# Patient Record
Sex: Male | Born: 1970 | Race: White | Hispanic: No | Marital: Single | Smoking: Never smoker
Health system: Southern US, Community
[De-identification: ages and names within clinical notes are randomized; demographics above are authoritative.]

## PROBLEM LIST (undated history)

## (undated) DIAGNOSIS — M549 Dorsalgia, unspecified: Secondary | ICD-10-CM

## (undated) DIAGNOSIS — Z87442 Personal history of urinary calculi: Secondary | ICD-10-CM

## (undated) DIAGNOSIS — F329 Major depressive disorder, single episode, unspecified: Secondary | ICD-10-CM

## (undated) DIAGNOSIS — R7303 Prediabetes: Secondary | ICD-10-CM

## (undated) DIAGNOSIS — I10 Essential (primary) hypertension: Secondary | ICD-10-CM

## (undated) DIAGNOSIS — M961 Postlaminectomy syndrome, not elsewhere classified: Secondary | ICD-10-CM

## (undated) DIAGNOSIS — T7840XA Allergy, unspecified, initial encounter: Secondary | ICD-10-CM

## (undated) DIAGNOSIS — G43909 Migraine, unspecified, not intractable, without status migrainosus: Secondary | ICD-10-CM

## (undated) DIAGNOSIS — G4730 Sleep apnea, unspecified: Secondary | ICD-10-CM

## (undated) DIAGNOSIS — K921 Melena: Secondary | ICD-10-CM

## (undated) DIAGNOSIS — M1990 Unspecified osteoarthritis, unspecified site: Secondary | ICD-10-CM

## (undated) DIAGNOSIS — N390 Urinary tract infection, site not specified: Secondary | ICD-10-CM

## (undated) DIAGNOSIS — E785 Hyperlipidemia, unspecified: Secondary | ICD-10-CM

## (undated) DIAGNOSIS — F32A Depression, unspecified: Secondary | ICD-10-CM

## (undated) DIAGNOSIS — F419 Anxiety disorder, unspecified: Secondary | ICD-10-CM

## (undated) DIAGNOSIS — G44009 Cluster headache syndrome, unspecified, not intractable: Secondary | ICD-10-CM

## (undated) DIAGNOSIS — R51 Headache: Secondary | ICD-10-CM

## (undated) DIAGNOSIS — M47817 Spondylosis without myelopathy or radiculopathy, lumbosacral region: Secondary | ICD-10-CM

## (undated) DIAGNOSIS — I639 Cerebral infarction, unspecified: Secondary | ICD-10-CM

## (undated) HISTORY — DX: Urinary tract infection, site not specified: N39.0

## (undated) HISTORY — DX: Unspecified osteoarthritis, unspecified site: M19.90

## (undated) HISTORY — DX: Melena: K92.1

## (undated) HISTORY — DX: Spondylosis without myelopathy or radiculopathy, lumbosacral region: M47.817

## (undated) HISTORY — DX: Postlaminectomy syndrome, not elsewhere classified: M96.1

## (undated) HISTORY — DX: Migraine, unspecified, not intractable, without status migrainosus: G43.909

## (undated) HISTORY — PX: BACK SURGERY: SHX140

## (undated) HISTORY — DX: Hyperlipidemia, unspecified: E78.5

## (undated) HISTORY — DX: Allergy, unspecified, initial encounter: T78.40XA

## (undated) HISTORY — DX: Headache: R51

---

## 2006-09-14 DIAGNOSIS — I639 Cerebral infarction, unspecified: Secondary | ICD-10-CM

## 2006-09-14 HISTORY — DX: Cerebral infarction, unspecified: I63.9

## 2007-05-25 ENCOUNTER — Emergency Department (HOSPITAL_COMMUNITY): Admission: EM | Admit: 2007-05-25 | Discharge: 2007-05-25 | Payer: Self-pay | Admitting: Emergency Medicine

## 2007-10-28 ENCOUNTER — Encounter: Admission: RE | Admit: 2007-10-28 | Discharge: 2007-10-28 | Payer: Self-pay | Admitting: Occupational Medicine

## 2007-11-10 ENCOUNTER — Encounter: Admission: RE | Admit: 2007-11-10 | Discharge: 2007-12-02 | Payer: Self-pay | Admitting: Occupational Medicine

## 2007-12-03 ENCOUNTER — Emergency Department (HOSPITAL_COMMUNITY): Admission: EM | Admit: 2007-12-03 | Discharge: 2007-12-03 | Payer: Self-pay | Admitting: Family Medicine

## 2008-01-23 ENCOUNTER — Emergency Department (HOSPITAL_COMMUNITY): Admission: EM | Admit: 2008-01-23 | Discharge: 2008-01-23 | Payer: Self-pay | Admitting: Emergency Medicine

## 2008-07-23 ENCOUNTER — Emergency Department (HOSPITAL_COMMUNITY): Admission: EM | Admit: 2008-07-23 | Discharge: 2008-07-23 | Payer: Self-pay | Admitting: Family Medicine

## 2008-09-14 HISTORY — PX: LUMBAR LAMINECTOMY/DECOMPRESSION MICRODISCECTOMY: SHX5026

## 2009-02-28 ENCOUNTER — Emergency Department (HOSPITAL_COMMUNITY): Admission: EM | Admit: 2009-02-28 | Discharge: 2009-02-28 | Payer: Self-pay | Admitting: Family Medicine

## 2009-08-22 ENCOUNTER — Ambulatory Visit (HOSPITAL_COMMUNITY): Admission: RE | Admit: 2009-08-22 | Discharge: 2009-08-23 | Payer: Self-pay | Admitting: Specialist

## 2009-09-14 HISTORY — PX: ANTERIOR AND POSTERIOR SPINAL FUSION: SHX2259

## 2010-02-19 ENCOUNTER — Ambulatory Visit: Payer: Self-pay | Admitting: Vascular Surgery

## 2010-03-27 ENCOUNTER — Inpatient Hospital Stay (HOSPITAL_COMMUNITY): Admission: RE | Admit: 2010-03-27 | Discharge: 2010-03-30 | Payer: Self-pay | Admitting: Specialist

## 2010-03-27 ENCOUNTER — Ambulatory Visit: Payer: Self-pay | Admitting: Vascular Surgery

## 2010-03-28 ENCOUNTER — Encounter (INDEPENDENT_AMBULATORY_CARE_PROVIDER_SITE_OTHER): Payer: Self-pay | Admitting: Specialist

## 2010-03-28 ENCOUNTER — Ambulatory Visit: Payer: Self-pay | Admitting: Vascular Surgery

## 2010-05-27 ENCOUNTER — Encounter: Admission: RE | Admit: 2010-05-27 | Discharge: 2010-05-27 | Payer: Self-pay | Admitting: Vascular Surgery

## 2010-05-27 ENCOUNTER — Ambulatory Visit: Payer: Self-pay | Admitting: Vascular Surgery

## 2010-10-14 ENCOUNTER — Encounter
Admission: RE | Admit: 2010-10-14 | Discharge: 2010-10-14 | Payer: Self-pay | Source: Home / Self Care | Attending: Specialist | Admitting: Specialist

## 2010-11-29 LAB — URINALYSIS, ROUTINE W REFLEX MICROSCOPIC
Glucose, UA: NEGATIVE mg/dL
Hgb urine dipstick: NEGATIVE
pH: 7.5 (ref 5.0–8.0)

## 2010-11-29 LAB — BASIC METABOLIC PANEL
BUN: 11 mg/dL (ref 6–23)
BUN: 9 mg/dL (ref 6–23)
CO2: 30 mEq/L (ref 19–32)
CO2: 30 mEq/L (ref 19–32)
Calcium: 8 mg/dL — ABNORMAL LOW (ref 8.4–10.5)
Calcium: 8.3 mg/dL — ABNORMAL LOW (ref 8.4–10.5)
Chloride: 99 mEq/L (ref 96–112)
GFR calc Af Amer: >60 mL/min (ref >60)
GFR calc Af Amer: >60 mL/min (ref >60)
Glucose, Bld: 123 mg/dL — ABNORMAL HIGH (ref 70–99)
Glucose, Bld: 126 mg/dL — ABNORMAL HIGH (ref 70–99)
Potassium: 3.3 mEq/L — ABNORMAL LOW (ref 3.5–5.1)
Potassium: 3.7 mEq/L (ref 3.5–5.1)
Sodium: 137 mEq/L (ref 135–145)

## 2010-11-29 LAB — HEMOGLOBIN AND HEMATOCRIT, BLOOD
HCT: 32.3 % — ABNORMAL LOW (ref 39.0–52.0)
HCT: 32.5 % — ABNORMAL LOW (ref 39.0–52.0)
Hemoglobin: 10.5 g/dL — ABNORMAL LOW (ref 13.0–17.0)
Hemoglobin: 11.1 g/dL — ABNORMAL LOW (ref 13.0–17.0)
Hemoglobin: 11.4 g/dL — ABNORMAL LOW (ref 13.0–17.0)

## 2010-11-30 LAB — POCT I-STAT 7, (LYTES, BLD GAS, ICA,H+H)
Acid-base deficit: 1 mmol/L (ref 0.0–2.0)
Calcium, Ion: 1.11 mmol/L — ABNORMAL LOW (ref 1.12–1.32)
Calcium, Ion: 1.13 mmol/L (ref 1.12–1.32)
HCT: 32 % — ABNORMAL LOW (ref 39.0–52.0)
HCT: 33 % — ABNORMAL LOW (ref 39.0–52.0)
O2 Saturation: 100 %
Patient temperature: 37
Patient temperature: 37.2
Sodium: 137 mEq/L (ref 135–145)
TCO2: 26 mmol/L (ref 0–100)
pCO2 arterial: 36 mmHg (ref 35.0–45.0)
pCO2 arterial: 43.3 mmHg (ref 35.0–45.0)
pO2, Arterial: 280 mmHg — ABNORMAL HIGH (ref 80.0–100.0)
pO2, Arterial: 345 mmHg — ABNORMAL HIGH (ref 80.0–100.0)

## 2010-11-30 LAB — CROSSMATCH

## 2010-11-30 LAB — CBC
HCT: 46.3 % (ref 39.0–52.0)
RBC: 4.83 MIL/uL (ref 4.22–5.81)
RDW: 12.2 % (ref 11.5–15.5)
WBC: 6.5 10*3/uL (ref 4.0–10.5)

## 2010-11-30 LAB — TYPE AND SCREEN
ABO/RH(D): O POS
Antibody Screen: NEGATIVE

## 2010-11-30 LAB — SURGICAL PCR SCREEN: MRSA, PCR: NEGATIVE

## 2010-11-30 LAB — BASIC METABOLIC PANEL
BUN: 12 mg/dL (ref 6–23)
GFR calc Af Amer: >60 mL/min (ref >60)
GFR calc non Af Amer: >60 mL/min (ref >60)
Potassium: 4.6 mEq/L (ref 3.5–5.1)
Sodium: 141 mEq/L (ref 135–145)

## 2010-11-30 LAB — ABO/RH: ABO/RH(D): O POS

## 2010-12-16 LAB — COMPREHENSIVE METABOLIC PANEL
ALT: 32 U/L (ref 0–53)
AST: 23 U/L (ref 0–37)
Albumin: 4.2 g/dL (ref 3.5–5.2)
BUN: 15 mg/dL (ref 6–23)
CO2: 28 mEq/L (ref 19–32)
Calcium: 9.5 mg/dL (ref 8.4–10.5)
Chloride: 103 mEq/L (ref 96–112)
Creatinine, Ser: 0.96 mg/dL (ref 0.4–1.5)
Glucose, Bld: 111 mg/dL — ABNORMAL HIGH (ref 70–99)
Potassium: 3.8 mEq/L (ref 3.5–5.1)

## 2010-12-16 LAB — URINALYSIS, ROUTINE W REFLEX MICROSCOPIC
Glucose, UA: NEGATIVE mg/dL
Ketones, ur: NEGATIVE mg/dL
pH: 6.5 (ref 5.0–8.0)

## 2010-12-16 LAB — APTT: aPTT: 28 seconds (ref 24–37)

## 2010-12-16 LAB — CBC
HCT: 42.4 % (ref 39.0–52.0)
Hemoglobin: 14.1 g/dL (ref 13.0–17.0)
RBC: 4.38 MIL/uL (ref 4.22–5.81)
RDW: 13.1 % (ref 11.5–15.5)

## 2010-12-22 LAB — POCT RAPID STREP A (OFFICE): Streptococcus, Group A Screen (Direct): NEGATIVE

## 2011-01-27 NOTE — Assessment & Plan Note (Signed)
OFFICE VISIT   KHOURY, SIEMON  DOB:  1971/05/04                                       05/27/2010  CHART#:[id number]   The patient presents today for followup after L4-5 ALIF.  He reports  that since the procedure he has continued to have discomfort around the  incision with numbness and also a sharp pain that can be fleeting around  the level of his umbilicus and also at the incision itself.  He has also  had difficulty with constipation and reflux, and occasional nausea since  the procedure.  He has had tried multiple remedies and has seen Dr. Jacqulyn Bath  in [facility] for GI consultation who did not see any problems.  I  discussed this with Dr. Shelle Iron, recommended I see him with a CAT scan to  rule out more uncommon problem such as internal hernia or abscess  formation.  He denies any fevers.  He does eat but less than usual.  He  lost about 15 pounds around the time of surgery but has gained some of  this back.   PHYSICAL EXAM:  Blood pressure is 157/97, heart rate 78, respirations  18, temperature 98.8.  His abdominal exam reveals moderate obesity and  his incision is well-healed.  He does not have any fluctuance.  Does  have some mild tenderness around the incision.  His femoral and dorsalis  pedis pulses are 2+ bilaterally.  He has no lower extremity swelling in  his left or right leg.   I reviewed his CAT scan images and discussed this with the patient and  his wife and case manager present.  This does not show any evidence of  abnormality, specifically no incisional problems with either hernia or  fluid collection.  He does not have any bowel distention and the  remaining portion of his studies simply show postoperative changes.  I  have discussed this with the patient and his wife explaining that this  in all likelihood would have shown any significant serious complication  following surgery.  I feel that he is simply taking more than the usual  amount  of time to resolve peri-incisional numbness and tenderness.  I  explained that even with retroperitoneal exposure that there can be  ileus over a prolonged period of time.  He was reassured with this  discussion and will see [place] again on an as-needed basis.     Larina Earthly, M.D.  Electronically Signed   TFE/MEDQ  D:  05/27/2010  T:  05/28/2010  Job:  [id number]   cc:   L. Lupe Carney, M.D.  Jene Every, M.D.  Carolyn Stare

## 2011-01-27 NOTE — Consult Note (Signed)
NEW PATIENT CONSULTATION   Gary Clark, Gary Clark  DOB:  [birth year]                                       02/19/2010  CHART#:[id number]   The patient presents today for evaluation of potential anterior lumbar  interbody fusion.  He is a 40 year old gentleman who has L4-5  spondylolisthesis and disk protrusion.  He has been evaluated by Dr.  Paula Libra who feels the optimal treatment for this disease may be  through an anterior lumbar interbody fusion.  He is seeing me for  further discussion of this.  He reports back and lower extremity pain.  This is worse with prolonged standing.  He had undergone a prior  posterior decompression approximately 6 months ago.  He has never had  any abdominal surgery.   His past medical history is significant for hypertension and elevated  cholesterol and moderate obesity.   SOCIAL HISTORY:  He is married with one child.  He is a Designer, multimedia.  He does not smoke or drink alcohol and has never smoked.   Family history is significant for premature atherosclerotic disease in  his mother.   REVIEW OF SYSTEMS:  Negative for weight loss or weight gain.  VASCULAR:  He does have a history of mini stroke.  CARDIAC:  Negative.  GI:  Negative.  NEUROLOGIC:  Positive for headache.  Pulmonary, hematologic, GU and ENT are negative.  MUSCULOSKELETAL:  Is significant for arthritis, joint pain and muscle  pain.  PSYCHIATRIC:  Negative.  SKIN:  Negative.   PHYSICAL EXAMINATION:  General:  A well-developed, well-nourished white  male appearing stated age.  Vital signs:  Blood pressure is 128/88,  pulse 98, respirations 16.  He is in no acute distress.  HEENT:  Normal.  Chest:  Clear.  Abdomen:  Is soft, moderately obese.  Nontender.  No  masses.  He has 2+ radial and 2+ dorsalis pedis pulses.  Musculoskeletal:  Shows no major deformities or cyanosis.  Neurological:  No focal weakness or paresthesias.  Skin:  Without ulcers or rashes.   I discussed the potential for anterior lumbar interbody fusion and my  role for this with the patient and his wife and also his case Production designer, theatre/television/film.  I have explained the mobilization of the intra-abdominal contents, left  ureter and iliac arteries and veins.  I discussed the potential injury  for all these and also discussed potential for retrograde ejaculation.  I explained that the incidence of all these would be uncommon.  He  understands and will continue his discussion with Dr. Shelle Iron and Dr.  Shon Baton to determine if anterior exposure is the appropriate approach and  if so we would be available to provide exposure.     Larina Earthly, M.D.  Electronically Signed   TFE/MEDQ  D:  02/19/2010  T:  02/20/2010  Job:  [id number]   cc:   Alvy Beal, MD  Jene Every, M.D.  Elsworth Soho, M.D.

## 2011-03-17 ENCOUNTER — Inpatient Hospital Stay (INDEPENDENT_AMBULATORY_CARE_PROVIDER_SITE_OTHER)
Admission: RE | Admit: 2011-03-17 | Discharge: 2011-03-17 | Disposition: A | Discharge status: Home / Self Care | Payer: Self-pay | Source: Ambulatory Visit | Attending: Family Medicine | Admitting: Family Medicine

## 2011-03-17 ENCOUNTER — Ambulatory Visit (INDEPENDENT_AMBULATORY_CARE_PROVIDER_SITE_OTHER): Payer: Self-pay

## 2011-03-17 DIAGNOSIS — S93409A Sprain of unspecified ligament of unspecified ankle, initial encounter: Secondary | ICD-10-CM

## 2011-06-26 LAB — I-STAT 8, (EC8 V) (CONVERTED LAB)
Acid-Base Excess: 3 — ABNORMAL HIGH
Bicarbonate: 29.1 — ABNORMAL HIGH
HCT: 50
Operator id: 146091
Sodium: 137
TCO2: 31
pCO2, Ven: 50.4 — ABNORMAL HIGH

## 2011-06-26 LAB — POCT I-STAT CREATININE
Creatinine, Ser: 1.2
Operator id: 146091

## 2011-06-29 ENCOUNTER — Inpatient Hospital Stay (INDEPENDENT_AMBULATORY_CARE_PROVIDER_SITE_OTHER)
Admission: RE | Admit: 2011-06-29 | Discharge: 2011-06-29 | Disposition: A | Discharge status: Home / Self Care | Payer: Self-pay | Source: Ambulatory Visit | Attending: Family Medicine | Admitting: Family Medicine

## 2011-06-29 ENCOUNTER — Ambulatory Visit (INDEPENDENT_AMBULATORY_CARE_PROVIDER_SITE_OTHER): Payer: Self-pay

## 2011-06-29 DIAGNOSIS — R6889 Other general symptoms and signs: Secondary | ICD-10-CM

## 2011-08-07 ENCOUNTER — Encounter: Payer: Self-pay | Admitting: *Deleted

## 2011-08-07 ENCOUNTER — Emergency Department (HOSPITAL_COMMUNITY)
Admission: EM | Admit: 2011-08-07 | Discharge: 2011-08-07 | Disposition: A | Discharge status: Home / Self Care | Payer: Self-pay | Attending: Emergency Medicine | Admitting: Emergency Medicine

## 2011-08-07 DIAGNOSIS — R110 Nausea: Secondary | ICD-10-CM | POA: Insufficient documentation

## 2011-08-07 DIAGNOSIS — G43909 Migraine, unspecified, not intractable, without status migrainosus: Secondary | ICD-10-CM | POA: Insufficient documentation

## 2011-08-07 HISTORY — DX: Dorsalgia, unspecified: M54.9

## 2011-08-07 MED ORDER — SODIUM CHLORIDE 0.9 % IV BOLUS (SEPSIS)
1000.0000 mL | Freq: Once | INTRAVENOUS | Status: AC
  Administered 2011-08-07: 1000 mL via INTRAVENOUS

## 2011-08-07 MED ORDER — DEXAMETHASONE SODIUM PHOSPHATE 10 MG/ML IJ SOLN
10.0000 mg | Freq: Once | INTRAMUSCULAR | Status: AC
  Administered 2011-08-07: 10 mg via INTRAVENOUS
  Filled 2011-08-07: qty 1

## 2011-08-07 MED ORDER — METOCLOPRAMIDE HCL 5 MG/ML IJ SOLN
10.0000 mg | Freq: Once | INTRAMUSCULAR | Status: AC
  Administered 2011-08-07: 10 mg via INTRAVENOUS
  Filled 2011-08-07: qty 2

## 2011-08-07 MED ORDER — DIPHENHYDRAMINE HCL 50 MG/ML IJ SOLN
25.0000 mg | Freq: Once | INTRAMUSCULAR | Status: AC
  Administered 2011-08-07: 50 mg via INTRAVENOUS
  Filled 2011-08-07: qty 1

## 2011-08-07 NOTE — ED Provider Notes (Signed)
History     CSN: [id number] Arrival date & time: 08/07/2011  5:34 PM   First MD Initiated Contact with Patient 08/07/11 2146      Chief Complaint  Patient presents with  . Nausea    (Consider location/radiation/quality/duration/timing/severity/associated sxs/prior treatment) HPI  Past Medical History  Diagnosis Date  . Back pain     History reviewed. No pertinent past surgical history.  History reviewed. No pertinent family history.  History  Substance Use Topics  . Smoking status: Never Smoker   . Smokeless tobacco: Not on file  . Alcohol Use: No      Review of Systems  Unable to perform ROS Constitutional: Negative for fever and chills.  HENT: Negative for neck pain and neck stiffness.   Respiratory: Negative for cough and shortness of breath.   Cardiovascular: Negative for chest pain and palpitations.  Gastrointestinal: Positive for nausea. Negative for vomiting.  Musculoskeletal: Negative for myalgias, arthralgias and gait problem.  Skin: Negative for color change and rash.  Neurological: Positive for light-headedness and headaches. Negative for speech difficulty, weakness and numbness.  Psychiatric/Behavioral: Negative for confusion and decreased concentration.  All other systems reviewed and are negative.    Allergies  Review of patient's allergies indicates no known allergies.  Home Medications   Current Outpatient Rx  Name Route Sig Dispense Refill  . ACETAMINOPHEN 500 MG PO TABS Oral Take 1,000 mg by mouth every 6 (six) hours as needed. For pain     . FLUOXETINE HCL 40 MG PO CAPS Oral Take 40 mg by mouth daily.      Marland Kitchen HYDROCHLOROTHIAZIDE 25 MG PO TABS Oral Take 25 mg by mouth daily.      Marland Kitchen LISINOPRIL 10 MG PO TABS Oral Take 10 mg by mouth daily.        BP 135/89  Pulse 76  Temp(Src) 97.7 F (36.5 C) (Oral)  Resp 16  SpO2 98%  Physical Exam  Nursing note and vitals reviewed. Constitutional: He is oriented to person, place, and time. He  appears well-developed and well-nourished.  HENT:  Head: Normocephalic and atraumatic.  Eyes: EOM are normal. Pupils are equal, round, and reactive to light.  Cardiovascular: Normal rate and regular rhythm.   Pulmonary/Chest: Effort normal and breath sounds normal. No respiratory distress.  Abdominal: Soft. Bowel sounds are normal. There is no tenderness.  Neurological: He is alert and oriented to person, place, and time. No cranial nerve deficit.       Strength and sensation intact  Skin: Skin is warm and dry.  Psychiatric: He has a normal mood and affect.    ED Course  Procedures (including critical care time)  Labs Reviewed - No data to display No results found.   1. Migraine headache      MDM  40 yo M with onset of HA now ~6 hours ago. States he was pulling into the park, and had onset of HA, lightheadedness, nausea, feeling, sweaty and clammy, feeling "fuzzy" which lasted ~15 minutes; most of his sxs have since resolved, but does have persistent HA, photophobia, and nausea. States current HA is frontal, as well as slight pressure on top of head. Has hx of migraines and chronic HA, which are normally frontal or in vertex, feels similar type pain, and about as severe as normal, but has had worse headaches before. Exam unremarkable- no abnormal neuro findings seen. In pt with chronic HA, and multiple different presentations of headache, feel is most likely due to recurrent migraine  HA. Is similar to priors, not worst headache, no neurologic findings on exam to suggest SAH. Will treat pt for migraine.  Pt reports significant improvement of pain. Discussed f/u with PCP for management of chronic headaches, and indications for return; pt expresses understanding.        Theotis Burrow, MD 08/07/11 [phone]

## 2011-08-07 NOTE — ED Notes (Signed)
He has been taking percocet intermittently for 2 years.  He took a percocet just a few minutes before his symptoms started approx 2-3 hours ago.   Dizziness weakness nausea muscle spasms.  He feels like his speech is slurred but is clear at present.  Mouth dry and he feels tired with sweats.

## 2011-08-08 NOTE — ED Provider Notes (Signed)
I saw and evaluated the patient, reviewed the resident's note and I agree with the findings and plan. 40 year old male complains of a headache, and nausea.  Which occurred after he took a Percocet.  He denies actual vomiting.  He has not had a fever, rash.  He has a history of migraine headaches.  His physical examination is normal.  He appears to be in no distress.  There is no indication of subarachnoid hemorrhage, CNS infection or other systemic illness.  There is no indication for a CAT scan of his head or laboratory testing.  Nicholes Stairs, MD 08/08/11 [phone]

## 2011-09-28 ENCOUNTER — Other Ambulatory Visit: Payer: Self-pay | Admitting: Neurological Surgery

## 2011-09-28 DIAGNOSIS — M545 Low back pain, unspecified: Secondary | ICD-10-CM

## 2011-10-02 ENCOUNTER — Ambulatory Visit
Admission: RE | Admit: 2011-10-02 | Discharge: 2011-10-02 | Disposition: A | Discharge status: Home / Self Care | Payer: No Typology Code available for payment source | Source: Ambulatory Visit | Attending: Neurological Surgery | Admitting: Neurological Surgery

## 2011-10-02 ENCOUNTER — Other Ambulatory Visit: Payer: Self-pay

## 2011-10-02 DIAGNOSIS — M545 Low back pain, unspecified: Secondary | ICD-10-CM

## 2011-12-05 ENCOUNTER — Emergency Department (HOSPITAL_COMMUNITY)
Admission: EM | Admit: 2011-12-05 | Discharge: 2011-12-06 | Disposition: A | Discharge status: Home / Self Care | Payer: Medicare Other | Attending: Emergency Medicine | Admitting: Emergency Medicine

## 2011-12-05 ENCOUNTER — Emergency Department (HOSPITAL_COMMUNITY): Payer: Medicare Other

## 2011-12-05 ENCOUNTER — Encounter (HOSPITAL_COMMUNITY): Payer: Self-pay | Admitting: *Deleted

## 2011-12-05 DIAGNOSIS — F3289 Other specified depressive episodes: Secondary | ICD-10-CM | POA: Insufficient documentation

## 2011-12-05 DIAGNOSIS — I861 Scrotal varices: Secondary | ICD-10-CM

## 2011-12-05 DIAGNOSIS — Z8673 Personal history of transient ischemic attack (TIA), and cerebral infarction without residual deficits: Secondary | ICD-10-CM | POA: Insufficient documentation

## 2011-12-05 DIAGNOSIS — Z79899 Other long term (current) drug therapy: Secondary | ICD-10-CM | POA: Insufficient documentation

## 2011-12-05 DIAGNOSIS — N509 Disorder of male genital organs, unspecified: Secondary | ICD-10-CM | POA: Insufficient documentation

## 2011-12-05 DIAGNOSIS — N5089 Other specified disorders of the male genital organs: Secondary | ICD-10-CM | POA: Insufficient documentation

## 2011-12-05 DIAGNOSIS — J45909 Unspecified asthma, uncomplicated: Secondary | ICD-10-CM | POA: Insufficient documentation

## 2011-12-05 DIAGNOSIS — I10 Essential (primary) hypertension: Secondary | ICD-10-CM | POA: Insufficient documentation

## 2011-12-05 DIAGNOSIS — F329 Major depressive disorder, single episode, unspecified: Secondary | ICD-10-CM | POA: Insufficient documentation

## 2011-12-05 HISTORY — DX: Depression, unspecified: F32.A

## 2011-12-05 HISTORY — DX: Essential (primary) hypertension: I10

## 2011-12-05 HISTORY — DX: Major depressive disorder, single episode, unspecified: F32.9

## 2011-12-05 HISTORY — DX: Cerebral infarction, unspecified: I63.9

## 2011-12-05 LAB — URINALYSIS, ROUTINE W REFLEX MICROSCOPIC
Ketones, ur: NEGATIVE mg/dL
Leukocytes, UA: NEGATIVE
Nitrite: NEGATIVE
Protein, ur: NEGATIVE mg/dL
Urobilinogen, UA: 1 mg/dL (ref 0.0–1.0)

## 2011-12-05 MED ORDER — IBUPROFEN 800 MG PO TABS
800.0000 mg | ORAL_TABLET | Freq: Once | ORAL | Status: AC
  Administered 2011-12-05: 800 mg via ORAL
  Filled 2011-12-05: qty 1

## 2011-12-05 NOTE — ED Notes (Signed)
Pt c/o slight L testicular pain starting last night. Pt denies unusual or strenuous activity or injury. Pt states pain worsened today and even worse tonight. Pt states pain is relieved w/ support to testicles.

## 2011-12-05 NOTE — ED Provider Notes (Signed)
History     CSN: [id number]  Arrival date & time 12/05/11  2030   First MD Initiated Contact with Patient 12/05/11 2145      Chief Complaint  Patient presents with  . Testicle Pain    L side, worse w/o support.     (Consider location/radiation/quality/duration/timing/severity/associated sxs/prior treatment) HPI Comments: Patient here with acute onset of left testicle pain - states that the pain started yesterday - small amount and only when he took his jeans off and let his scrotum hang free - reports that he and his wife went to [place] today and while he had on underwear that hugged his testicle close, he had minimal pain - reports tonight when he put his PJ's on, then the pain became much worse, he looked down and noted that the scrotum was red and the left testicle hung lower than the right.  Denies fever, chills, uretheral discharge, dysuria, hematuria, pain with ejaculation or blood in his ejaculate.  Patient is a 41 y.o. male presenting with male genitourinary complaint. The history is provided by the patient. No language interpreter was used.  Male GU Problem Primary symptoms include scrotal pain.  Primary symptoms include no dysuria, no genital itching, no genital lesions, no genital rash, no penile discharge, no penile pain and no priapism. This is a new problem. The current episode started yesterday. The problem occurs constantly. The problem has been gradually worsening. Pertinent negatives include no anorexia, no diaphoresis, no nausea, no vomiting, no abdominal pain, no abdominal swelling, no frequency, no constipation and no diarrhea. There has been no fever. He has tried nothing for the symptoms. The treatment provided no relief. Sexual activity: sexually active. He never uses condoms. Partner displays symptoms of an STD: no. Associated medical issues do not include gonorrhea, syphilis, chlamydia, Herpes simplex, erectile dysfunction, erectile aid use or HIV.    Past Medical  History  Diagnosis Date  . Back pain   . Hypertension   . Stroke   . Asthma   . Depression     Past Surgical History  Procedure Date  . Back surgery     History reviewed. No pertinent family history.  History  Substance Use Topics  . Smoking status: Never Smoker   . Smokeless tobacco: Not on file  . Alcohol Use: No      Review of Systems  Constitutional: Negative for diaphoresis.  Gastrointestinal: Negative for nausea, vomiting, abdominal pain, diarrhea, constipation and anorexia.  Genitourinary: Positive for scrotal swelling and testicular pain. Negative for dysuria, urgency, frequency, hematuria, decreased urine volume, discharge, penile swelling, penile pain and penile discharge.  All other systems reviewed and are negative.    Allergies  Review of patient's allergies indicates no known allergies.  Home Medications   Current Outpatient Rx  Name Route Sig Dispense Refill  . ACETAMINOPHEN 500 MG PO TABS Oral Take 1,000 mg by mouth every 6 (six) hours as needed. For pain     . FLUOXETINE HCL 40 MG PO CAPS Oral Take 40 mg by mouth daily.      Marland Kitchen HYDROCHLOROTHIAZIDE 25 MG PO TABS Oral Take 25 mg by mouth daily.      Marland Kitchen LISINOPRIL 10 MG PO TABS Oral Take 10 mg by mouth daily.        BP 151/104  Pulse 100  Temp(Src) 99.1 F (37.3 C) (Oral)  Resp 20  SpO2 99%  Physical Exam  Nursing note and vitals reviewed. Constitutional: He is oriented to person, place, and  time. He appears well-developed and well-nourished. No distress.  HENT:  Head: Normocephalic and atraumatic.  Right Ear: External ear normal.  Left Ear: External ear normal.  Nose: Nose normal.  Mouth/Throat: Oropharynx is clear and moist. No oropharyngeal exudate.  Eyes: Conjunctivae are normal. Pupils are equal, round, and reactive to light. No scleral icterus.  Neck: Normal range of motion. Neck supple.  Cardiovascular: Normal rate, regular rhythm and normal heart sounds.  Exam reveals no gallop and  no friction rub.   No murmur heard. Pulmonary/Chest: Effort normal and breath sounds normal. He exhibits no tenderness.  Abdominal: Soft. Bowel sounds are normal. He exhibits no distension. There is no tenderness. Hernia confirmed negative in the right inguinal area and confirmed negative in the left inguinal area.  Genitourinary: Right testis shows no mass, no swelling and no tenderness. Cremasteric reflex is not absent on the right side. Left testis shows swelling and tenderness. Left testis shows no mass. Cremasteric reflex is not absent on the left side. Circumcised. No penile erythema or penile tenderness. No discharge found.  Musculoskeletal: Normal range of motion. He exhibits no edema and no tenderness.  Lymphadenopathy:    He has no cervical adenopathy.  Neurological: He is alert and oriented to person, place, and time. No cranial nerve deficit.  Skin: Skin is warm and dry. No rash noted. No erythema. No pallor.  Psychiatric: He has a normal mood and affect. His behavior is normal. Judgment and thought content normal.    ED Course  Procedures (including critical care time)  Labs Reviewed - No data to display No results found.  Results for orders placed during the hospital encounter of 12/05/11  URINALYSIS, ROUTINE W REFLEX MICROSCOPIC      Component Value Range   Color, Urine YELLOW  YELLOW    APPearance CLEAR  CLEAR    Specific Gravity, Urine 1.026  1.005 - 1.030    pH 6.0  5.0 - 8.0    Glucose, UA NEGATIVE  NEGATIVE (mg/dL)   Hgb urine dipstick NEGATIVE  NEGATIVE    Bilirubin Urine NEGATIVE  NEGATIVE    Ketones, ur NEGATIVE  NEGATIVE (mg/dL)   Protein, ur NEGATIVE  NEGATIVE (mg/dL)   Urobilinogen, UA 1.0  0.0 - 1.0 (mg/dL)   Nitrite NEGATIVE  NEGATIVE    Leukocytes, UA NEGATIVE  NEGATIVE    US Scrotum  12/06/2011  *RADIOLOGY REPORT*  Clinical Data:  Left scrotal pain and swelling  SCROTAL ULTRASOUND DOPPLER ULTRASOUND OF THE TESTICLES  Technique: Complete ultrasound  examination of the testicles, epididymis, and other scrotal structures was performed.  Color and spectral Doppler ultrasound were also utilized to evaluate blood flow to the testicles.  Comparison:  None  Findings:  Right testis:  4.0 x 2.0 x 3.0 cm in size.  Normal homogeneous echogenicity.  No focal mass or calcification.  Normal intratesticular blood flow on color Doppler imaging.  Left testis:  3.1 x 1.4 x 2.9 cm in size.  Normal homogeneous echogenicity.  No focal mass or calcification.  Normal intratesticular blood flow on color Doppler imaging.  Right epididymis:  Normal in size and appearance.  Left epididymis:  Normal in size and appearance.  Hydocele:  Absent bilaterally  Varicocele:  Probable left varicocele.  Pulsed Doppler interrogation of both testes demonstrates low resistance flow bilaterally.  No hernia identified.  IMPRESSION: Probable left varicocele. Otherwise negative exam.  Original Report Authenticated By: Lollie Marrow, M.D.   [place] Art/ven Flow Abd Pelv Doppler  12/06/2011  *  RADIOLOGY REPORT*  Clinical Data:  Left scrotal pain and swelling  SCROTAL ULTRASOUND DOPPLER ULTRASOUND OF THE TESTICLES  Technique: Complete ultrasound examination of the testicles, epididymis, and other scrotal structures was performed.  Color and spectral Doppler ultrasound were also utilized to evaluate blood flow to the testicles.  Comparison:  None  Findings:  Right testis:  4.0 x 2.0 x 3.0 cm in size.  Normal homogeneous echogenicity.  No focal mass or calcification.  Normal intratesticular blood flow on color Doppler imaging.  Left testis:  3.1 x 1.4 x 2.9 cm in size.  Normal homogeneous echogenicity.  No focal mass or calcification.  Normal intratesticular blood flow on color Doppler imaging.  Right epididymis:  Normal in size and appearance.  Left epididymis:  Normal in size and appearance.  Hydocele:  Absent bilaterally  Varicocele:  Probable left varicocele.  Pulsed Doppler interrogation of both testes  demonstrates low resistance flow bilaterally.  No hernia identified.  IMPRESSION: Probable left varicocele. Otherwise negative exam.  Original Report Authenticated By: Lollie Marrow, M.D.     Varicocele    MDM  Patient with varicocele on ultrasound, though the patient has no ultrasound evidence of epididymitis, I suspect this and will treat for this - I have informted the patient of the varicocele and his options for treatment.        Izola Price Fletcher, [place] 12/06/11 0028

## 2011-12-06 MED ORDER — CIPROFLOXACIN HCL 500 MG PO TABS: 500.0000 mg | ORAL_TABLET | Freq: Two times a day (BID) | ORAL | Status: AC

## 2011-12-06 MED ORDER — HYDROCODONE-ACETAMINOPHEN 5-325 MG PO TABS: 1.0000 | ORAL_TABLET | ORAL | Status: AC | PRN

## 2011-12-06 NOTE — Discharge Instructions (Signed)
Varicocele A varicocele is a swelling of veins in the scrotum (the bag of skin that contains the testicles). It is most common in young men. It occurs most often on the left side. Small or painless varicoceles do not need treatment. Most often, this is not a serious problem, but further tests may be needed to confirm the diagnosis. Surgery may be needed if complications of varicoceles arise. Rarely, varicoceles can reoccur after surgery. CAUSES  The swelling is due to blood backing up in the vein that leads from the testicle back to the body. Blood backs up because the valves inside the vein are not working properly. Veins normally return blood to the heart. Valves in veins are supposed to be one-way valves. They should not allow blood to flow backwards. If the valves do not work well, blood can pool in a vein and make it swell. The same thing happens with varicose veins in the leg. SYMPTOMS  A varicocele most often causes no symptoms. When they occur, symptoms include:   Swelling on one side of the scrotum.   Swelling that is more obvious when standing up.   A lumpy feeling in the scrotum.   Heaviness on one side of the scrotum.   Dull ache in the scrotum, especially after exercise or prolonged standing or sitting.   Slower growth or reduced size of the testicle on the side of the varicocele (in young males).   Problems with fertility can arise if the testicle does not grow normally.  DIAGNOSIS  Varicocele is usually diagnosed by a physical exam. Sometimes ultrasonography is done. TREATMENT  Usually, varicoceles need no treatment. They are often routinely monitored on exam by your caregiver to ensure they do not slow the growth of the testicle on that side. Treatment may be needed if:  The varicocele is large.   There is a lot of pain.   The varicocele causes a decrease in the size of the testicle in a growing adolescent.   The other testicle is absent or not normal.   Varicoceles  are found on both sides of the scrotum.   There is pain when exercising.   There are fertility problems.  There are two types of treatment:  Surgery. The surgeon ties off the swollen veins. Surgery may be done with an incision in the skin or through a laparoscope. The surgery is usually done in an outpatient setting. Outpatient means there is no overnight stay in a hospital.   Embolization. A small tube is placed in a vein and guided into the swollen veins. X-rays are used to guide the small tube. Tiny metal coils or other blocking items are put through the tube. This blocks swollen veins and the flow of blood. This is usually done in an outpatient setting without the use of general anesthesia.  HOME CARE INSTRUCTIONS  To decrease discomfort:  Wear supportive underwear.   Use an athletic supporter for sports.   Only take over-the-counter or prescription medicines for pain or discomfort as directed by your caregiver.  SEEK MEDICAL CARE IF:   Pain is increasing.   Swelling does not decrease when lying down.   Testicle is smaller.   The testicle becomes enlarged, swollen, red, or painful.  Document Released: 12/07/2000 Document Revised: 08/20/2011 Document Reviewed: 12/11/2009 [facility] Patient Information 2012 [place], [place].

## 2011-12-06 NOTE — ED Provider Notes (Signed)
Medical screening examination/treatment/procedure(s) were performed by non-physician practitioner and as supervising physician I was immediately available for consultation/collaboration.   Recie Cirrincione, MD 12/06/11 1643 

## 2011-12-07 LAB — GC/CHLAMYDIA PROBE AMP, GENITAL: GC Probe Amp, Genital: NEGATIVE

## 2012-03-17 ENCOUNTER — Emergency Department (INDEPENDENT_AMBULATORY_CARE_PROVIDER_SITE_OTHER)
Admission: EM | Admit: 2012-03-17 | Discharge: 2012-03-17 | Disposition: A | Discharge status: Home / Self Care | Payer: Self-pay | Source: Home / Self Care | Attending: Family Medicine | Admitting: Family Medicine

## 2012-03-17 ENCOUNTER — Encounter (HOSPITAL_COMMUNITY): Payer: Self-pay

## 2012-03-17 DIAGNOSIS — I10 Essential (primary) hypertension: Secondary | ICD-10-CM

## 2012-03-17 DIAGNOSIS — G44009 Cluster headache syndrome, unspecified, not intractable: Secondary | ICD-10-CM

## 2012-03-17 HISTORY — DX: Cluster headache syndrome, unspecified, not intractable: G44.009

## 2012-03-17 MED ORDER — SUMATRIPTAN 20 MG/ACT NA SOLN: NASAL | Status: DC

## 2012-03-17 MED ORDER — CYCLOBENZAPRINE HCL 10 MG PO TABS: 10.0000 mg | ORAL_TABLET | Freq: Three times a day (TID) | ORAL | Status: AC | PRN

## 2012-03-17 MED ORDER — LISINOPRIL 10 MG PO TABS: 10.0000 mg | ORAL_TABLET | Freq: Every day | ORAL | Status: DC

## 2012-03-17 NOTE — ED Notes (Signed)
Pt placed on O2 via mask at 15 lpm, sitting in high fowlers.  Administered to relief headache sx per Dr Dana Allan order

## 2012-03-17 NOTE — ED Notes (Signed)
Dr Tressia Danas notified of pts BP, hx and chief complaint.

## 2012-03-17 NOTE — ED Notes (Signed)
C/o lt occipital headache for 2 days.  States has taken tylenol, percocet and ibuprofen and it lessens the pain some but then it comes back.  BP is elevated here today- states he hasn't had his BP taken in at least 6 months.

## 2012-03-17 NOTE — ED Provider Notes (Signed)
History     CSN: [id number]  Arrival date & time 03/17/12  1629   First MD Initiated Contact with Patient 03/17/12 1643      Chief Complaint  Patient presents with  . Headache    (Consider location/radiation/quality/duration/timing/severity/associated sxs/prior treatment) HPI Comments: 41 year old male with history of high blood pressure, cluster headaches, mood disorder and chronic pain. Here complaining of left-sided occipital headache for 2 days. Symptoms associated with nausea and photophobia. Denies eye tearing or nasal discharge. Denies visual changes from baseline. States that has had similar episodes of headache in the past. Has taken ibuprofen and half of Percocet pill today with no significant relief.  Patient is hypertensive here with a blood pressure 164/104 is states that he took daily dose of hydrochlorothiazide 25 mg in the morning today. He has taken lisinopril 10 mg daily in the past but has not seen his primary care doctor or had his blood pressure checked in the last 6 months as he lost his medical insurance. Denies chest pain, shortness of breath, swelling or PND. Denies extremity weakness or paresthesias. No balance or gait problems. No difficulty understanding or producing speech.    Past Medical History  Diagnosis Date  . Back pain   . Hypertension   . Asthma   . Depression   . Cluster headache   . Stroke     pt states "mini-Stroke"    Past Surgical History  Procedure Date  . Back surgery     No family history on file.  History  Substance Use Topics  . Smoking status: Never Smoker   . Smokeless tobacco: Not on file  . Alcohol Use: No      Review of Systems  Allergies  Review of patient's allergies indicates no known allergies.  Home Medications   Current Outpatient Rx  Name Route Sig Dispense Refill  . CITALOPRAM HYDROBROMIDE 20 MG PO TABS Oral Take 20 mg by mouth daily.    Marland Kitchen FLUOXETINE HCL 40 MG PO CAPS Oral Take 40 mg by mouth daily.       Marland Kitchen HYDROCHLOROTHIAZIDE 25 MG PO TABS Oral Take 25 mg by mouth daily.      . ACETAMINOPHEN 500 MG PO TABS Oral Take 1,500 mg by mouth every 6 (six) hours as needed. For pain    . CLONAZEPAM 0.5 MG PO TABS Oral Take 0.5 mg by mouth 2 (two) times daily as needed. For anxiety    . CYCLOBENZAPRINE HCL 10 MG PO TABS Oral Take 1 tablet (10 mg total) by mouth 3 (three) times daily as needed for muscle spasms. 20 tablet 0  . LISINOPRIL 10 MG PO TABS Oral Take 1 tablet (10 mg total) by mouth daily. 30 tablet 1  . SUMATRIPTAN 20 MG/ACT NA SOLN  1 spray in one nostril x1 time can repeat in 2 hours x1 time if headache returns. 1 Inhaler 0    BP 158/106  Pulse 84  Temp 98.2 F (36.8 C) (Oral)  Resp 18  SpO2 100%  Physical Exam  Constitutional: He is oriented to person, place, and time. He appears well-developed and well-nourished. No distress.  HENT:  Head: Normocephalic and atraumatic.  Right Ear: External ear normal.  Left Ear: External ear normal.  Nose: Nose normal.  Mouth/Throat: Oropharynx is clear and moist. No oropharyngeal exudate.  Neck: Normal range of motion. Neck supple.       Left occipital and suboccipital area with reported tenderness to palpation. Also reported increase pain with  neck movement in the above mentioned area.  Cardiovascular: Normal rate, regular rhythm, normal heart sounds and intact distal pulses.  Exam reveals no gallop and no friction rub.   No murmur heard. Pulmonary/Chest: Effort normal and breath sounds normal. No respiratory distress. He has no wheezes. He has no rales. He exhibits no tenderness.  Neurological: He is alert and oriented to person, place, and time. He has normal strength and normal reflexes. He displays no tremor. No cranial nerve deficit or sensory deficit. He exhibits normal muscle tone. He displays a negative Romberg sign. He displays no seizure activity. Coordination and gait normal.       Visual fields normal per comparison. No face drop. No  arm drop. Normal topics alternating movements. (Finger to nose)  Skin: No rash noted. He is not diaphoretic.    ED Course  Procedures (including critical care time)  Labs Reviewed - No data to display No results found.   1. Headache, cluster   2. Hypertension       MDM  No focal neurologic findings on examination. Impress cluster headache patient was treated with oxygen 100% 15 L in a nonrebreather mask for 15 minutes  With reported significant improvement of his headache prior to discharge. Restarted lisinopril 10 mg as previously prescribed. Prescribed Flexeril and nasal sumatriptan. Asked to followup with his primary care provider in the next 2 weeks for blood pressure monitoring or go to the emergency department if any new or worsening symptoms.        Sharin Grave, MD 03/17/12 1950

## 2012-06-10 ENCOUNTER — Encounter: Payer: Self-pay | Admitting: Family Medicine

## 2012-06-10 ENCOUNTER — Ambulatory Visit (INDEPENDENT_AMBULATORY_CARE_PROVIDER_SITE_OTHER): Payer: Medicare Other | Admitting: Family Medicine

## 2012-06-10 ENCOUNTER — Telehealth: Payer: Self-pay | Admitting: Family Medicine

## 2012-06-10 VITALS — BP 160/100 | Temp 98.8°F | Ht 66.0 in | Wt 232.0 lb

## 2012-06-10 DIAGNOSIS — I10 Essential (primary) hypertension: Secondary | ICD-10-CM

## 2012-06-10 DIAGNOSIS — F329 Major depressive disorder, single episode, unspecified: Secondary | ICD-10-CM

## 2012-06-10 DIAGNOSIS — F3289 Other specified depressive episodes: Secondary | ICD-10-CM

## 2012-06-10 DIAGNOSIS — E785 Hyperlipidemia, unspecified: Secondary | ICD-10-CM

## 2012-06-10 DIAGNOSIS — M4800 Spinal stenosis, site unspecified: Secondary | ICD-10-CM

## 2012-06-10 DIAGNOSIS — F32A Depression, unspecified: Secondary | ICD-10-CM

## 2012-06-10 DIAGNOSIS — R7309 Other abnormal glucose: Secondary | ICD-10-CM

## 2012-06-10 DIAGNOSIS — Z0000 Encounter for general adult medical examination without abnormal findings: Secondary | ICD-10-CM

## 2012-06-10 DIAGNOSIS — Z131 Encounter for screening for diabetes mellitus: Secondary | ICD-10-CM

## 2012-06-10 DIAGNOSIS — Z23 Encounter for immunization: Secondary | ICD-10-CM

## 2012-06-10 DIAGNOSIS — E669 Obesity, unspecified: Secondary | ICD-10-CM

## 2012-06-10 DIAGNOSIS — R739 Hyperglycemia, unspecified: Secondary | ICD-10-CM

## 2012-06-10 LAB — BASIC METABOLIC PANEL
BUN: 14 mg/dL (ref 6–23)
Creatinine, Ser: 0.9 mg/dL (ref 0.4–1.5)
GFR: 98.2 mL/min (ref >60.00)
Glucose, Bld: 92 mg/dL (ref 70–99)
Potassium: 4.1 mEq/L (ref 3.5–5.1)

## 2012-06-10 LAB — HEPATIC FUNCTION PANEL
ALT: 40 U/L (ref 0–53)
Alkaline Phosphatase: 55 U/L (ref 39–117)
Bilirubin, Direct: 0.1 mg/dL (ref 0.0–0.3)
Total Bilirubin: 0.7 mg/dL (ref 0.3–1.2)
Total Protein: 7.4 g/dL (ref 6.0–8.3)

## 2012-06-10 LAB — LIPID PANEL
Cholesterol: 258 mg/dL — ABNORMAL HIGH (ref 0–200)
Total CHOL/HDL Ratio: 6
VLDL: 73.2 mg/dL — ABNORMAL HIGH (ref 0.0–40.0)

## 2012-06-10 MED ORDER — HYDROCHLOROTHIAZIDE 25 MG PO TABS: 25.0000 mg | ORAL_TABLET | Freq: Every day | ORAL | Status: DC

## 2012-06-10 MED ORDER — CITALOPRAM HYDROBROMIDE 20 MG PO TABS: 40.0000 mg | ORAL_TABLET | Freq: Every day | ORAL | Status: DC

## 2012-06-10 MED ORDER — CITALOPRAM HYDROBROMIDE 40 MG PO TABS
40.0000 mg | ORAL_TABLET | Freq: Every day | ORAL | Status: DC
Start: 2012-06-10 — End: 2012-10-14

## 2012-06-10 MED ORDER — LISINOPRIL 10 MG PO TABS
10.0000 mg | ORAL_TABLET | Freq: Every day | ORAL | Status: DC
Start: 2012-06-10 — End: 2012-11-22

## 2012-06-10 NOTE — Patient Instructions (Addendum)
-  We have ordered labs or studies at this visit. It usually takes 1-2 weeks for results and processing. We will contact you with instructions IF your results are abnormal. Normal results will be released to your [facility] in 1-2 weeks. If you have not heard from [place] or can not find your results in [facility] in 2 weeks please contact our office.   -We placed a referral for you as discussed. It usually takes about 1-2 weeks to process and schedule this referral. If you have not heard from [place] regarding this appointment in 2 weeks please contact our office.   - PA free butterbur (Petadolex) or melatonin (1-5mg ) nightly for headaches  -SCHEDULE APPOINTMENT WITH COUNSELOR   -FOLLOW UP IN 3 MONTHS

## 2012-06-10 NOTE — Progress Notes (Addendum)
Chief Complaint  Patient presents with  . Establish Care    HPI:  [name] is here to establish care. Reports did not get along with his past PCP.  On review of chart has history of spinal stenosis and spinal decompression of lumbar spine in 2010 and 2011. -wants referral to PMR  Depression: -on citalopram -no thoughts of self harm or SI -boy with CP - gets irritable -agreeable to counseling  Hypertension: -longstanding -uncontrolled in the past -denies CP, SOB, vision changes, swelling  HLP: -hcx of but not ever tx with medications  Patient is on permanent disability and now on medicare related to back issues.  Wants to have a PCP to manage his HTN, HLP and preventive care.  Tries to eat healthy. Tries to walk on a regular basis.  VaccinesL UTD tetanus, wants flu  Has hx of migraine and cluster headaches for years. Gets headaches almost daily. No aura, nausea, vomiting, vision changes or change in headaches. Has had head scans in the past.      ROS: See pertinent positives and negatives per HPI.  Past Medical History  Diagnosis Date  . Back pain   . Hypertension   . Asthma   . Depression   . Cluster headache   . Stroke     pt states "mini-Stroke"  . Arthritis   . Blood in stool   . Headache     frequent   . Allergy   . Hyperlipidemia   . Migraines   . UTI (urinary tract infection)     Family History  Problem Relation Age of Onset  . Heart disease Mother   . Hypertension Mother   . Arthritis Father   . Hypertension Father   . Miscarriages / Stillbirths Maternal Grandmother   . Heart disease Maternal Grandfather   . Sudden death Maternal Grandfather     History   Social History  . Marital Status: Married    Spouse Name: N/A    Number of Children: N/A  . Years of Education: N/A   Social History Main Topics  . Smoking status: Never Smoker   . Smokeless tobacco: None  . Alcohol Use: No  . Drug Use: No  . Sexually Active: Yes    Birth  Control/ Protection: None   Other Topics Concern  . None   Social History Narrative  . None    Current outpatient prescriptions:acetaminophen (TYLENOL) 500 MG tablet, Take 1,500 mg by mouth every 6 (six) hours as needed. For pain, Disp: , Rfl: ;  citalopram (CELEXA) 40 MG tablet, Take 1 tablet (40 mg total) by mouth daily., Disp: 90 tablet, Rfl: 1;  hydrochlorothiazide (HYDRODIURIL) 25 MG tablet, Take 1 tablet (25 mg total) by mouth daily., Disp: 90 tablet, Rfl: 1 lisinopril (PRINIVIL,ZESTRIL) 10 MG tablet, Take 1 tablet (10 mg total) by mouth daily., Disp: 90 tablet, Rfl: 1;  DISCONTD: citalopram (CELEXA) 20 MG tablet, Take 20 mg by mouth daily., Disp: , Rfl: ;  DISCONTD: citalopram (CELEXA) 20 MG tablet, Take 2 tablets (40 mg total) by mouth daily., Disp: 90 tablet, Rfl: 1;  DISCONTD: hydrochlorothiazide (HYDRODIURIL) 25 MG tablet, Take 25 mg by mouth daily.  , Disp: , Rfl:  DISCONTD: lisinopril (PRINIVIL,ZESTRIL) 10 MG tablet, Take 1 tablet (10 mg total) by mouth daily., Disp: 30 tablet, Rfl: 1  EXAM:  Filed Vitals:   06/10/12 0822  BP: 160/100  Temp: 98.8 F (37.1 C)   Recheck BP after sitting for 5 minutes: 142/92 Body  mass index is 37.45 kg/(m^2).  GENERAL: vitals reviewed and listed below, alert, oriented, appears well hydrated and in no acute distress  HEENT: atraumatic, conjucntiva clear, no obvious abnormalities on inspection of external nose and ears  NECK: no masses on inspection  LUNGS: clear to auscultation bilaterally, no wheezes, rales or rhonchi, good air movement  CV: HRRR, no peripheral edema  MS: moves all extremities without noticeable abnormality  PSYCH: pleasant and cooperative, no obvious depression or anxiety  ASSESSMENT AND PLAN:  Discussed the following assessment and plan:  1. Hyperlipidemia  Lipid Panel  2. Visit for preventive health examination  Flu vaccine greater than or equal to 3yo preservative free IM, Basic metabolic panel, Lipid Panel,  Hepatic Function Panel  3. Hypertension  hydrochlorothiazide (HYDRODIURIL) 25 MG tablet, lisinopril (PRINIVIL,ZESTRIL) 10 MG tablet  4. Depression  citalopram (CELEXA) 40 MG tablet, DISCONTINUED: citalopram (CELEXA) 20 MG tablet  5. Obesity  Hemoglobin A1c  6. Diabetes mellitus screening  Hemoglobin A1c  7. Hyperglycemia  Hemoglobin A1c  8. Spinal stenosis  Ambulatory referral to Physical Medicine Rehab    Orders Placed This Encounter  Procedures  . Flu vaccine greater than or equal to 3yo preservative free IM  . Basic metabolic panel  . Lipid Panel  . Hemoglobin A1c  . Hepatic Function Panel  . Ambulatory referral to Physical Medicine Rehab    Referral Priority:  Routine    Referral Type:  Rehabilitation    Referral Reason:  Specialty Services Required    Requested Specialty:  Physical Medicine and Rehabilitation    Number of Visits Requested:  1   -will check labs per above then adjust BP meds -pt to schedule with counselor - prefers to avoid medications - not taking benzos or prozac -for headaches prefers not to take meds - discussed alt tx and listed some on patient handout with discussion about risks and potential concerns regarding CAM -meds, PMH, PSH, FH, SH reviewed and updated -new med list provided -flu vaccine today -referred to PMR for chronic back pain - has seen PMR in the past with recs for nerve ablation, but then lost insurance -follow up 3 months or sooner pending labs   Patient Instructions  -We have ordered labs or studies at this visit. It usually takes 1-2 weeks for results and processing. We will contact you with instructions IF your results are abnormal. Normal results will be released to your [facility] in 1-2 weeks. If you have not heard from [place] or can not find your results in [facility] in 2 weeks please contact our office.   -We placed a referral for you as discussed. It usually takes about 1-2 weeks to process and schedule this referral. If you have not  heard from [place] regarding this appointment in 2 weeks please contact our office.   - PA free butterbur (Petadolex) or melatonin (1-5mg ) nightly for headaches  -SCHEDULE APPOINTMENT WITH COUNSELOR   -FOLLOW UP IN 3 MONTHS      Return to clinic immediately if symptoms worsen or persist or new concerns.  Return in about 3 months (around 09/09/2012) for htn, hlp.  Kriste Basque R.

## 2012-06-10 NOTE — Telephone Encounter (Signed)
Called and spoke with pt and pt is aware.  

## 2012-06-10 NOTE — Telephone Encounter (Signed)
Gary Clark,  Please let Mr. Carico know:  His labs show borderline diabetes, elevated triglycerides and borderline elevation of his LDL (bad cholesterol). Given he has high blood pressure, I would like him to come in sometime in the next few months so that we can talk about treatment options.  In the meantime,  We recommend the following healthy lifestyle measures to the best of his abilities given his other health problems: - eat a healthy diet consisting of lots of vegetables, fruits, beans, nuts, seeds, healthy meats such as white chicken and fish and whole grains.  - avoid fried foods, fast food, processed foods, sodas, red meet and other fattening foods.  - get a least 150 minutes of exercise per week.   The rest of his labs looked good.

## 2012-09-09 ENCOUNTER — Encounter: Payer: Medicare Other | Admitting: Family Medicine

## 2012-09-09 NOTE — Progress Notes (Signed)
NO SHOWED APPT  This encounter was created in error - please disregard. 

## 2012-10-12 ENCOUNTER — Telehealth: Payer: Self-pay

## 2012-10-12 NOTE — Telephone Encounter (Signed)
Needs follow up appt for his HTN, and cholesterol sometime soon. Can have him schedule and provide medication to get to that appointment.

## 2012-10-12 NOTE — Telephone Encounter (Signed)
Rx request from [facility] for HCTZ 25 mg, Citalopram 40mg , and Lisinopril 10mg .  Pls advise if all these medications can be sent ot pharmacy for 90 day supply.

## 2012-10-13 NOTE — Telephone Encounter (Signed)
Called and spoke with pt and pt is aware.  Pt states he has enough medication to last him and he will call back to schedule appt (has to wait to see if school is in tomorrow).

## 2012-10-14 ENCOUNTER — Encounter: Payer: Self-pay | Admitting: Family Medicine

## 2012-10-14 ENCOUNTER — Ambulatory Visit (INDEPENDENT_AMBULATORY_CARE_PROVIDER_SITE_OTHER): Payer: Medicare Other | Admitting: Family Medicine

## 2012-10-14 VITALS — BP 124/80 | HR 96 | Temp 98.7°F | Wt 233.0 lb

## 2012-10-14 DIAGNOSIS — M545 Low back pain, unspecified: Secondary | ICD-10-CM

## 2012-10-14 DIAGNOSIS — F3289 Other specified depressive episodes: Secondary | ICD-10-CM

## 2012-10-14 DIAGNOSIS — I10 Essential (primary) hypertension: Secondary | ICD-10-CM

## 2012-10-14 DIAGNOSIS — E785 Hyperlipidemia, unspecified: Secondary | ICD-10-CM

## 2012-10-14 DIAGNOSIS — F32A Depression, unspecified: Secondary | ICD-10-CM

## 2012-10-14 DIAGNOSIS — G43909 Migraine, unspecified, not intractable, without status migrainosus: Secondary | ICD-10-CM

## 2012-10-14 DIAGNOSIS — F329 Major depressive disorder, single episode, unspecified: Secondary | ICD-10-CM

## 2012-10-14 DIAGNOSIS — G8929 Other chronic pain: Secondary | ICD-10-CM

## 2012-10-14 DIAGNOSIS — R7309 Other abnormal glucose: Secondary | ICD-10-CM

## 2012-10-14 DIAGNOSIS — R7303 Prediabetes: Secondary | ICD-10-CM

## 2012-10-14 LAB — LIPID PANEL
Cholesterol: 200 mg/dL (ref 0–200)
Total CHOL/HDL Ratio: 6
Triglycerides: 305 mg/dL — ABNORMAL HIGH (ref 0.0–149.0)
VLDL: 61 mg/dL — ABNORMAL HIGH (ref 0.0–40.0)

## 2012-10-14 LAB — LDL CHOLESTEROL, DIRECT: Direct LDL: 103.8 mg/dL

## 2012-10-14 MED ORDER — CITALOPRAM HYDROBROMIDE 10 MG PO TABS: ORAL_TABLET | ORAL | Status: DC

## 2012-10-14 MED ORDER — AMITRIPTYLINE HCL 25 MG PO TABS: 25.0000 mg | ORAL_TABLET | Freq: Every day | ORAL | Status: DC

## 2012-10-14 NOTE — Patient Instructions (Addendum)
-  We placed a referral for you as discussed to the NEUROLOGIST. It usually takes about 1-2 weeks to process and schedule this referral. If you have not heard from [place] regarding this appointment in 2 weeks please contact our office.  -We have ordered labs or studies at this visit. It can take up to 1-2 weeks for results and processing. We will contact you with instructions IF your results are abnormal. Normal results will be released to your [facility]. If you have not heard from [place] or can not find your results in [facility] in 2 weeks please contact our office.  -schedule appointment with counselor  -Wean off of the celexa as instructed  -As we discussed, we have prescribed a new medication  (Amitriptyline) for you at this appointment. We discussed the common and serious potential adverse effects of this medication and you can review these and more with the pharmacist when you pick up your medication.  Please follow the instructions for use carefully and notify us immediately if you have any problems taking this medication.  We recommend the following healthy lifestyle measures: - eat a healthy diet consisting of lots of vegetables, fruits, beans, nuts, seeds, healthy meats such as white chicken and fish and whole grains.  - avoid fried foods, fast food, processed foods, sodas, red meet and other fattening foods.  - get a least 150 minutes of aerobic exercise per week.   -follow up in 1 month

## 2012-10-14 NOTE — Progress Notes (Signed)
Chief Complaint  Patient presents with  . med follow up    HPI:  Follow up:  Patient Active Problem List  Diagnosis  . Migraine/Cluster HAs -prefers to not take meds - discussed tx options last visit -headaches unchanged, just more frequent, almost daily -chronic hz of severe and frequent - not improving - started as a child -he would like to see a neurologist as has never seen one -prefers to not do medications and had tried CAM last visit - but would rather see neurologist first -denies: fevers, chills, weight loss, weakness vision changes  . Chronic low back pain - hx spinal stenosis, decompression surgery in 2010, 2011 -referred to PMR per his request last visit  . Depression: -on citalopram - prefers to not take meds - referred to counseling last visit -stable in terms of depression - reports celaxa really has not done him any good -but feels like irritable and this has gotten worse, not anxious -just gets irritated easily -No SI or HI -sleep is not great  . Hyperlipemia Lab Results  Component Value Date   CHOL 258* 06/10/2012   HDL 42.10 06/10/2012   LDLDIRECT 144.4 06/10/2012   TRIG 366.0* 06/10/2012   CHOLHDL 6 06/10/2012  -doing two miles per day on treadmill at [facility] -working on diet -pt is fasting  . Hypertension -on HCTZ 25 and Lisinopril 10 -stable -denies: CP, SOB, palpitations, swelling  Prediabetes: on labs 05/2012 -diet/exercise:   ROS: See pertinent positives and negatives per HPI.  Past Medical History  Diagnosis Date  . Back pain   . Hypertension   . Asthma   . Depression   . Cluster headache   . Stroke     pt states "mini-Stroke"  . Arthritis   . Blood in stool   . Headache     frequent   . Allergy   . Hyperlipidemia   . Migraines   . UTI (urinary tract infection)     Family History  Problem Relation Age of Onset  . Heart disease Mother   . Hypertension Mother   . Arthritis Father   . Hypertension Father   . Miscarriages /  Stillbirths Maternal Grandmother   . Heart disease Maternal Grandfather   . Sudden death Maternal Grandfather     History   Social History  . Marital Status: Married    Spouse Name: N/A    Number of Children: N/A  . Years of Education: N/A   Social History Main Topics  . Smoking status: Never Smoker   . Smokeless tobacco: None  . Alcohol Use: No  . Drug Use: No  . Sexually Active: Yes    Birth Control/ Protection: None   Other Topics Concern  . None   Social History Narrative  . None    Current outpatient prescriptions:acetaminophen (TYLENOL) 500 MG tablet, Take 1,500 mg by mouth every 6 (six) hours as needed. For pain, Disp: , Rfl: ;  amitriptyline (ELAVIL) 25 MG tablet, Take 1 tablet (25 mg total) by mouth at bedtime., Disp: 30 tablet, Rfl: 2 citalopram (CELEXA) 10 MG tablet, 30mg  (3 tablets) daily for 1 week, then 20mg  daily for 1 week, then 10 mg daily for 1 week, then 10 mg every other day for 1 week, then stop, Disp: 90 tablet, Rfl: 1;  hydrochlorothiazide (HYDRODIURIL) 25 MG tablet, Take 1 tablet (25 mg total) by mouth daily., Disp: 90 tablet, Rfl: 1;  lisinopril (PRINIVIL,ZESTRIL) 10 MG tablet, Take 1 tablet (10 mg total) by  mouth daily., Disp: 90 tablet, Rfl: 1  EXAM:  Filed Vitals:   10/14/12 1026  BP: 124/80  Pulse: 96  Temp: 98.7 F (37.1 C)    There is no height on file to calculate BMI.  GENERAL: vitals reviewed and listed above, alert, oriented, appears well hydrated and in no acute distress  HEENT: atraumatic, conjunttiva clear, no obvious abnormalities on inspection of external nose and ears  NECK: no obvious masses on inspection  LUNGS: clear to auscultation bilaterally, no wheezes, rales or rhonchi, good air movement  CV: HRRR, no peripheral edema  MS: moves all extremities without noticeable abnormality  NEURO: CN II-XII intact, PERRLA, finger to nose normal  PSYCH: pleasant and cooperative, no obvious depression or anxiety  ASSESSMENT  AND PLAN:  Discussed the following assessment and plan:  1. Migraine  Ambulatory referral to Neurology, amitriptyline (ELAVIL) 25 MG tablet  2. Chronic low back pain    3. Depression  citalopram (CELEXA) 10 MG tablet, amitriptyline (ELAVIL) 25 MG tablet  4. Hyperlipemia  Lipid Panel  5. Hypertension    6. Prediabetes     -referred to neuro per pt request for worsening HAs -discussed options for depression and HAs - and will wean of clexa which pt reports did not help at all and do trial of TCA - discussed risks -will see optho for eye exam -rechecking lipids today  - FASTING -counseling advised -follow up 1 month  -Patient advised to return or notify a doctor immediately if symptoms worsen or persist or new concerns arise.  Patient Instructions  -We placed a referral for you as discussed to the NEUROLOGIST. It usually takes about 1-2 weeks to process and schedule this referral. If you have not heard from [place] regarding this appointment in 2 weeks please contact our office.  -We have ordered labs or studies at this visit. It can take up to 1-2 weeks for results and processing. We will contact you with instructions IF your results are abnormal. Normal results will be released to your [facility]. If you have not heard from [place] or can not find your results in [facility] in 2 weeks please contact our office.  -schedule appointment with counselor  -Wean off of the celexa as instructed  -As we discussed, we have prescribed a new medication  (Amitriptyline) for you at this appointment. We discussed the common and serious potential adverse effects of this medication and you can review these and more with the pharmacist when you pick up your medication.  Please follow the instructions for use carefully and notify us immediately if you have any problems taking this medication.  We recommend the following healthy lifestyle measures: - eat a healthy diet consisting of lots of vegetables, fruits, beans,  nuts, seeds, healthy meats such as white chicken and fish and whole grains.  - avoid fried foods, fast food, processed foods, sodas, red meet and other fattening foods.  - get a least 150 minutes of aerobic exercise per week.   -follow up in 1 month      KIM, HANNAH R.

## 2012-10-15 ENCOUNTER — Telehealth: Payer: Self-pay | Admitting: Family Medicine

## 2012-10-15 NOTE — Telephone Encounter (Signed)
Please let him know labs show:  -triglycerides and cholesterol a little abnormal but has improved,  not something that needs a medication at this time -

## 2012-10-18 NOTE — Telephone Encounter (Signed)
Spoke to pt told him labs showed triglycerides and cholesterol a little abnormal but has improved, not something that needs a medication at this time per Dr. Selena Batten. Pt verbalized understanding.

## 2012-10-25 ENCOUNTER — Encounter: Payer: Self-pay | Admitting: Family Medicine

## 2012-10-25 NOTE — Progress Notes (Signed)
Received OV note from Dr. Marjory Lies in Neurology from 10/24/12. Dx migraines, secondary HA. Plan MRI, cont amitriptyline, trial of sumatriptan, ? Sleep study, ? topiramate in the future.

## 2012-10-31 ENCOUNTER — Other Ambulatory Visit: Payer: Self-pay | Admitting: Diagnostic Neuroimaging

## 2012-10-31 DIAGNOSIS — G43909 Migraine, unspecified, not intractable, without status migrainosus: Secondary | ICD-10-CM

## 2012-10-31 DIAGNOSIS — R519 Headache, unspecified: Secondary | ICD-10-CM

## 2012-11-07 ENCOUNTER — Ambulatory Visit
Admission: RE | Admit: 2012-11-07 | Discharge: 2012-11-07 | Disposition: A | Discharge status: Home / Self Care | Payer: Medicare HMO | Source: Ambulatory Visit | Attending: Diagnostic Neuroimaging | Admitting: Diagnostic Neuroimaging

## 2012-11-07 DIAGNOSIS — G43909 Migraine, unspecified, not intractable, without status migrainosus: Secondary | ICD-10-CM

## 2012-11-07 DIAGNOSIS — R519 Headache, unspecified: Secondary | ICD-10-CM

## 2012-11-15 ENCOUNTER — Ambulatory Visit: Payer: Medicare Other | Admitting: Family Medicine

## 2012-11-22 ENCOUNTER — Ambulatory Visit (INDEPENDENT_AMBULATORY_CARE_PROVIDER_SITE_OTHER): Payer: Medicare HMO | Admitting: Family Medicine

## 2012-11-22 ENCOUNTER — Encounter: Payer: Self-pay | Admitting: Family Medicine

## 2012-11-22 VITALS — BP 124/84 | HR 96 | Temp 98.8°F | Wt 234.0 lb

## 2012-11-22 DIAGNOSIS — I10 Essential (primary) hypertension: Secondary | ICD-10-CM

## 2012-11-22 DIAGNOSIS — E785 Hyperlipidemia, unspecified: Secondary | ICD-10-CM

## 2012-11-22 DIAGNOSIS — F3289 Other specified depressive episodes: Secondary | ICD-10-CM

## 2012-11-22 DIAGNOSIS — F329 Major depressive disorder, single episode, unspecified: Secondary | ICD-10-CM

## 2012-11-22 DIAGNOSIS — G43909 Migraine, unspecified, not intractable, without status migrainosus: Secondary | ICD-10-CM

## 2012-11-22 DIAGNOSIS — F32A Depression, unspecified: Secondary | ICD-10-CM

## 2012-11-22 DIAGNOSIS — M545 Low back pain, unspecified: Secondary | ICD-10-CM

## 2012-11-22 DIAGNOSIS — G8929 Other chronic pain: Secondary | ICD-10-CM

## 2012-11-22 MED ORDER — HYDROCHLOROTHIAZIDE 25 MG PO TABS: 25.0000 mg | ORAL_TABLET | Freq: Every day | ORAL | Status: DC

## 2012-11-22 MED ORDER — LISINOPRIL 10 MG PO TABS: 10.0000 mg | ORAL_TABLET | Freq: Every day | ORAL | Status: DC

## 2012-11-22 NOTE — Progress Notes (Signed)
Chief Complaint  Patient presents with  . Follow-up    HPI:  Follow up:  Depression: -celexa (no benefit per pt) stopped and trial of TCA last visit to target HAs and depression -also advised counseling -he reports mood is improved, but he can't tolerate the amitriptyline -denies: SI, hopelessness  HAs: -OV notes from neuro reviewed -has mri scheduled  -seems like headaches are not as severe  Chronic back pain: -followed by PMR  HLD: -improving per recent check -lifestyle recs advised  HTN: -HCTZ and Lisinopril -denies: CP, SOB, swelling   ROS: See pertinent positives and negatives per HPI.  Past Medical History  Diagnosis Date  . Back pain   . Hypertension   . Asthma   . Depression   . Cluster headache   . Stroke     pt states "mini-Stroke"  . Arthritis   . Blood in stool   . Headache     frequent   . Allergy   . Hyperlipidemia   . Migraines   . UTI (urinary tract infection)     Family History  Problem Relation Age of Onset  . Heart disease Mother   . Hypertension Mother   . Arthritis Father   . Hypertension Father   . Miscarriages / Stillbirths Maternal Grandmother   . Heart disease Maternal Grandfather   . Sudden death Maternal Grandfather     History   Social History  . Marital Status: Married    Spouse Name: N/A    Number of Children: N/A  . Years of Education: N/A   Social History Main Topics  . Smoking status: Never Smoker   . Smokeless tobacco: None  . Alcohol Use: No  . Drug Use: No  . Sexually Active: Yes    Birth Control/ Protection: None   Other Topics Concern  . None   Social History Narrative  . None    Current outpatient prescriptions:acetaminophen (TYLENOL) 500 MG tablet, Take 1,500 mg by mouth every 6 (six) hours as needed. For pain, Disp: , Rfl: ;  amitriptyline (ELAVIL) 25 MG tablet, Take 1 tablet (25 mg total) by mouth at bedtime., Disp: 30 tablet, Rfl: 2;  hydrochlorothiazide (HYDRODIURIL) 25 MG tablet, Take 1  tablet (25 mg total) by mouth daily., Disp: 90 tablet, Rfl: 3 citalopram (CELEXA) 10 MG tablet, 30mg  (3 tablets) daily for 1 week, then 20mg  daily for 1 week, then 10 mg daily for 1 week, then 10 mg every other day for 1 week, then stop, Disp: 90 tablet, Rfl: 1;  lisinopril (PRINIVIL,ZESTRIL) 10 MG tablet, Take 1 tablet (10 mg total) by mouth daily., Disp: 90 tablet, Rfl: 3  EXAM:  Filed Vitals:   11/22/12 1319  BP: 124/84  Pulse: 96  Temp: 98.8 F (37.1 C)    Body mass index is 37.79 kg/(m^2).  GENERAL: vitals reviewed and listed above, alert, oriented, appears well hydrated and in no acute distress  HEENT: atraumatic, conjunttiva clear, no obvious abnormalities on inspection of external nose and ears  NECK: no obvious masses on inspection  LUNGS: clear to auscultation bilaterally, no wheezes, rales or rhonchi, good air movement  CV: HRRR, no peripheral edema  MS: moves all extremities without noticeable abnormality  PSYCH: pleasant and cooperative, no obvious depression or anxiety  ASSESSMENT AND PLAN:  Discussed the following assessment and plan:  Hypertension - Plan: hydrochlorothiazide (HYDRODIURIL) 25 MG tablet, lisinopril (PRINIVIL,ZESTRIL) 10 MG tablet, DISCONTINUED: lisinopril (PRINIVIL,ZESTRIL) 10 MG tablet  Migraine/Cluster HAs  Chronic low back pain -  hx spinal stenosis, decompression surgery in 2010, 2011  Depression  Hyperlipemia  -mood seems to have improved but he feels this is not a results of the TCA - he wants to stop this as it is making him tired -discussed taper off and advised counseling, if any mood issues return may need to have him see psych as he has not found benefit or tolerated several medications -feels headaches have improved a little, he will use sumatriptan prn until he follows up with his neurologist -refilled antihypertensives -lifestyle recs advised -Patient advised to return or notify a doctor immediately if symptoms worsen or  persist or new concerns arise.  Patient Instructions  -please slowly taper off of the amitriptyline  -please call neurologist for appointment to discuss test results and further treatment of your headaches  -if mood issues worsen, please see counselor and let me know  -continue other medications for now  -follow up with me in 3 months     KIM, HANNAH R.

## 2012-11-22 NOTE — Patient Instructions (Signed)
-  please slowly taper off of the amitriptyline  -please call neurologist for appointment to discuss test results and further treatment of your headaches  -if mood issues worsen, please see counselor and let me know  -continue other medications for now  -follow up with me in 3 months

## 2013-02-22 ENCOUNTER — Ambulatory Visit (INDEPENDENT_AMBULATORY_CARE_PROVIDER_SITE_OTHER): Payer: Medicare HMO | Admitting: Family Medicine

## 2013-02-22 ENCOUNTER — Encounter: Payer: Self-pay | Admitting: Family Medicine

## 2013-02-22 VITALS — BP 122/90 | Temp 98.3°F | Wt 228.0 lb

## 2013-02-22 DIAGNOSIS — G8929 Other chronic pain: Secondary | ICD-10-CM

## 2013-02-22 DIAGNOSIS — R7309 Other abnormal glucose: Secondary | ICD-10-CM

## 2013-02-22 DIAGNOSIS — F32A Depression, unspecified: Secondary | ICD-10-CM

## 2013-02-22 DIAGNOSIS — F329 Major depressive disorder, single episode, unspecified: Secondary | ICD-10-CM

## 2013-02-22 DIAGNOSIS — G43909 Migraine, unspecified, not intractable, without status migrainosus: Secondary | ICD-10-CM

## 2013-02-22 DIAGNOSIS — E785 Hyperlipidemia, unspecified: Secondary | ICD-10-CM

## 2013-02-22 DIAGNOSIS — R7303 Prediabetes: Secondary | ICD-10-CM

## 2013-02-22 DIAGNOSIS — M545 Low back pain, unspecified: Secondary | ICD-10-CM

## 2013-02-22 DIAGNOSIS — F3289 Other specified depressive episodes: Secondary | ICD-10-CM

## 2013-02-22 DIAGNOSIS — I10 Essential (primary) hypertension: Secondary | ICD-10-CM

## 2013-02-22 MED ORDER — CITALOPRAM HYDROBROMIDE 10 MG PO TABS: ORAL_TABLET | ORAL | Status: DC

## 2013-02-22 NOTE — Progress Notes (Signed)
Chief Complaint  Patient presents with  . 3 month follow up    HPI:  Follow up:  HTN: -meds: HCTZ 25, lisinopril 10 -denies: CP, SOB,   Prediabetes: -labs 05/2012 at preventive visit  Depression: -we did a trial of several medications including SSRI and TCA but he did not tolerate either -he is taking celexa 10mg  daily, restarted this and is feeling fine -was to try counseling and if needed medication refer to psych  Migraines/cluster HAs: -followed by neurology  Chronic back pain: -referred to PMR - but thinks never contacted -hx of spinal fusion, prefers non surgical options  Wants number to call to set up consult for bariatric surgery through [facility], has taken seminar  ROS: See pertinent positives and negatives per HPI.  Past Medical History  Diagnosis Date  . Back pain   . Hypertension   . Asthma   . Depression   . Cluster headache   . Stroke     pt states "mini-Stroke"  . Arthritis   . Blood in stool   . Headache(784.0)     frequent   . Allergy   . Hyperlipidemia   . Migraines   . UTI (urinary tract infection)     Family History  Problem Relation Age of Onset  . Heart disease Mother   . Hypertension Mother   . Arthritis Father   . Hypertension Father   . Miscarriages / Stillbirths Maternal Grandmother   . Heart disease Maternal Grandfather   . Sudden death Maternal Grandfather     History   Social History  . Marital Status: Married    Spouse Name: N/A    Number of Children: N/A  . Years of Education: N/A   Social History Main Topics  . Smoking status: Never Smoker   . Smokeless tobacco: None  . Alcohol Use: No  . Drug Use: No  . Sexually Active: Yes    Birth Control/ Protection: None   Other Topics Concern  . None   Social History Narrative  . None    Current outpatient prescriptions:acetaminophen (TYLENOL) 500 MG tablet, Take 1,500 mg by mouth every 6 (six) hours as needed. For pain, Disp: , Rfl: ;  citalopram (CELEXA) 10 MG  tablet, On tablet daily by mouth, Disp: 90 tablet, Rfl: 1;  hydrochlorothiazide (HYDRODIURIL) 25 MG tablet, Take 1 tablet (25 mg total) by mouth daily., Disp: 90 tablet, Rfl: 3 lisinopril (PRINIVIL,ZESTRIL) 10 MG tablet, Take 1 tablet (10 mg total) by mouth daily., Disp: 90 tablet, Rfl: 3  EXAM:  Filed Vitals:   02/22/13 1013  BP: 122/90  Temp: 98.3 F (36.8 C)    Body mass index is 36.82 kg/(m^2).  GENERAL: vitals reviewed and listed above, alert, oriented, appears well hydrated and in no acute distress  HEENT: atraumatic, conjunttiva clear, no obvious abnormalities on inspection of external nose and ears  NECK: no obvious masses on inspection  LUNGS: clear to auscultation bilaterally, no wheezes, rales or rhonchi, good air movement  CV: HRRR, no peripheral edema  MS: moves all extremities without noticeable abnormality  PSYCH: pleasant and cooperative, no obvious depression or anxiety  ASSESSMENT AND PLAN:  Discussed the following assessment and plan:  Depression - Plan: citalopram (CELEXA) 10 MG tablet  Migraine/Cluster HAs  Chronic low back pain - hx spinal stenosis, decompression surgery in 2010, 2011  Hyperlipemia  Hypertension  Prediabetes  -placed another referral for PMR -provided number for bariatric surgery coordinator -refilled celexa -labs next visit -follow up in  3-4 months -Patient advised to return or notify a doctor immediately if symptoms worsen or persist or new concerns arise.  Patient Instructions  -We placed a referral for you as discussed for the back doctor. It usually takes about 1-2 weeks to process and schedule this referral. If you have not heard from [place] regarding this appointment in 2 weeks please contact our office.  -call [phone] (bariatric surgery coordinator)  to ask about bariatric surgery options, scheduling appointment  -follow up in 3-4 months or sooner if concerns       Jazell Rosenau, Dahlia Client R.

## 2013-02-22 NOTE — Patient Instructions (Signed)
-  We placed a referral for you as discussed for the back doctor. It usually takes about 1-2 weeks to process and schedule this referral. If you have not heard from [place] regarding this appointment in 2 weeks please contact our office.  -call [phone] (bariatric surgery coordinator)  to ask about bariatric surgery options, scheduling appointment  -follow up in 3-4 months or sooner if concerns

## 2013-03-02 ENCOUNTER — Encounter: Payer: Self-pay | Admitting: Physical Medicine & Rehabilitation

## 2013-03-10 ENCOUNTER — Ambulatory Visit (HOSPITAL_BASED_OUTPATIENT_CLINIC_OR_DEPARTMENT_OTHER): Payer: Medicare HMO | Admitting: Physical Medicine & Rehabilitation

## 2013-03-10 ENCOUNTER — Encounter: Payer: Self-pay | Admitting: Physical Medicine & Rehabilitation

## 2013-03-10 ENCOUNTER — Encounter: Payer: Medicare HMO | Attending: Physical Medicine & Rehabilitation

## 2013-03-10 VITALS — BP 135/82 | HR 85 | Resp 14 | Ht 66.0 in | Wt 229.0 lb

## 2013-03-10 DIAGNOSIS — M961 Postlaminectomy syndrome, not elsewhere classified: Secondary | ICD-10-CM | POA: Insufficient documentation

## 2013-03-10 DIAGNOSIS — M545 Low back pain, unspecified: Secondary | ICD-10-CM

## 2013-03-10 DIAGNOSIS — M76899 Other specified enthesopathies of unspecified lower limb, excluding foot: Secondary | ICD-10-CM | POA: Insufficient documentation

## 2013-03-10 DIAGNOSIS — Z79899 Other long term (current) drug therapy: Secondary | ICD-10-CM

## 2013-03-10 DIAGNOSIS — M4310 Spondylolisthesis, site unspecified: Secondary | ICD-10-CM | POA: Insufficient documentation

## 2013-03-10 DIAGNOSIS — M7061 Trochanteric bursitis, right hip: Secondary | ICD-10-CM

## 2013-03-10 DIAGNOSIS — G8929 Other chronic pain: Secondary | ICD-10-CM

## 2013-03-10 NOTE — Patient Instructions (Addendum)

## 2013-03-10 NOTE — Progress Notes (Signed)
Subjective:    Patient ID: Gary Clark, male    DOB: 1970/09/15, 42 y.o.   MRN: [id number] CC:  Hip and back pain since 2010 HPI Work related injury in sept 2010.  Hurt back and underwent L4-5 decompressive laminectomy in Dec 2010, numbness in left leg worsened and went on to lumbar fusion July 2011 L4-5 Anterior and Posterior.Left leg numbness improved but now over the last 6-8 months increasing with pain once again.  Seen by neurosurgery Jan 2013, second opinion no further surgery recommended Repeat CT myelogram in 2012 demonstrated dynamic retrolisthesis at L3-4, exacerbated by extension. No postoperative injections for physical therapy. Patient has gained 60 pounds since 2010. Has not returned to work. 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 Takes over-the-counter Tylenol and ibuprofen Pain Inventory Average Pain 5 Pain Right Now 7 My pain is sharp and aching  In the last 24 hours, has pain interfered with the following? General activity 4 Relation with others 0 Enjoyment of life 10 What TIME of day is your pain at its worst? morning Sleep (in general) Poor  Pain is worse with: walking, bending and sitting Pain improves with: pacing activities Relief from Meds: 0  Mobility walk without assistance how many minutes can you walk? 20-30 ability to climb steps?  yes do you drive?  yes Do you have any goals in this area?  yes  Function disabled: date disabled 2010 Do you have any goals in this area?  no  Neuro/Psych weakness numbness tingling trouble walking confusion depression anxiety  Prior Studies CT/MRI *RADIOLOGY REPORT* Jan 18,2013 Clinical Data: Low back pain with intermittent bilateral groin and  thigh pain. Two prior lumbar surgeries.  CT LUMBAR SPINE WITHOUT CONTRAST  Technique: Multidetector CT imaging of the lumbar spine was  performed without intravenous contrast administration.  Multiplanar  CT image reconstructions were also generated.  Comparison: CT myelogram dated 10/14/2010  Findings: And extends from T12-L1 through S2.  T12-L1 through L2-3: No significant abnormalities.  L3-4: Since the prior CT myelogram the patient has developed a 3  mm retrolisthesis of L3 on L4. There is new anterior broad-based  bulging of the disc at L3-4 but this is not felt to be clinically  significant other than to indicate progressive degeneration of the  disc. There is no posterior bulging of the disc into the spinal  canal.  L4-5: Solid anterior, posterior, and interbody fusions with no  residual impingement.  L5-S1: Normal.  IMPRESSION:  1. New 3 mm retrolisthesis of L3 on L4.  2. Otherwise stable appearance of the lumbar spine.  Original Report Authenticated By: Gwynn Burly, M.D.  Physicians involved in your care Any changes since last visit?  no   Family History  Problem Relation Age of Onset  . Heart disease Mother   . Hypertension Mother   . Arthritis Father   . Hypertension Father   . Miscarriages / Stillbirths Maternal Grandmother   . Heart disease Maternal Grandfather   . Sudden death Maternal Grandfather    History   Social History  . Marital Status: Married    Spouse Name: N/A    Number of Children: N/A  . Years of Education: N/A   Social History Main Topics  . Smoking status: Never Smoker   . Smokeless tobacco: None  . Alcohol Use: No  . Drug Use: No  . Sexually Active: Yes    Birth Control/ Protection: None  Other Topics Concern  . None   Social History Narrative  . None   Past Surgical History  Procedure Laterality Date  . Back surgery    . Lumbar laminectomy/decompression microdiscectomy  2010  . Anterior and posterior spinal fusion  2011   Past Medical History  Diagnosis Date  . Back pain   . Hypertension   . Asthma   . Depression   . Cluster headache   . Stroke     pt states "mini-Stroke"  . Arthritis   . Blood in  stool   . Headache(784.0)     frequent   . Allergy   . Hyperlipidemia   . Migraines   . UTI (urinary tract infection)    BP 135/82  Pulse 85  Resp 14  Ht 5\' 6"  (1.676 m)  Wt 229 lb (103.874 kg)  BMI 36.98 kg/m2  SpO2 97%     Review of Systems  Constitutional: Positive for diaphoresis and unexpected weight change.  Musculoskeletal: Positive for back pain and gait problem.  Neurological: Positive for weakness and numbness.  Psychiatric/Behavioral: Positive for confusion and dysphoric mood. The patient is nervous/anxious.   All other systems reviewed and are negative.       Objective:   Physical Exam  Nursing note and vitals reviewed. Constitutional: He is oriented to person, place, and time. He appears well-developed and well-nourished.  HENT:  Head: Normocephalic.  Eyes: EOM are normal. Pupils are equal, round, and reactive to light.  Musculoskeletal:       Right hip: He exhibits tenderness.       Left hip: He exhibits bony tenderness.       Lumbar back: He exhibits decreased range of motion, tenderness and deformity. He exhibits no swelling, no edema and no spasm.  Tender over greater trochanters  Neurological: He is alert and oriented to person, place, and time. He has normal strength. No sensory deficit. Coordination and gait normal.  Reflex Scores:      Patellar reflexes are 1+ on the right side and 1+ on the left side.      Achilles reflexes are 1+ on the right side and 1+ on the left side. Psychiatric: He has a normal mood and affect.          Assessment & Plan:  1. 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 L3-L4 retrolisthesis causing facet mediated pain. He may benefit from lumbar medial branch blocks and if these give only a short term relief then lumbar radio frequency neurotomy 3. Trochanteric bursitis left greater than right hip already taking nonsteroidal anti-inflammatories over-the-counter. We'll  give him exercises as well as do left trochanteric bursa injection  Discussed exercise including trying exercise bikes either recumbent or standard  Over half of the 45 min visit was spent counseling and coordinating care.  Trochanteric bursa injection without ultrasound guidance  Indication Trochanteric bursitis. Exam has tenderness over the greater trochanter of the hip. Pain has not responded to conservative care such as exercise therapy and oral medications. Pain interferes with sleep or with mobility Informed consent was obtained after describing risks and benefits of the procedure with the patient these include bleeding bruising and infection. Patient has signed written consent form. Patient placed in a lateral decubitus position with the affected hip superior. Point of maximal pain was palpated marked and prepped with Betadine and entered with a 25 gauge 3 inch needle to bone contact. Needle slightly withdrawn then 40 mg Depo-Medrol with  4 cc 1% lidocaine were injected. Patient tolerated procedure well. Post procedure instructions given.  Patient is not pursuing narcotic analgesics medications because of his poor tolerance

## 2013-03-28 ENCOUNTER — Telehealth: Payer: Self-pay

## 2013-03-28 NOTE — Telephone Encounter (Signed)
Message copied by Judd Gaudier on Tue Mar 28, 2013  9:59 AM ------      Message from: Su Monks      Created: Fri Mar 24, 2013 12:08 PM       New patient , he is not on narcotics and does not really want to take them, but please educate patient about risks if he would get narcotics and drinking alcohol ------

## 2013-03-28 NOTE — Telephone Encounter (Signed)
Left message for patient to call office regarding alcohol in urine drug screen.

## 2013-04-11 ENCOUNTER — Encounter: Payer: Medicare HMO | Attending: Physical Medicine & Rehabilitation

## 2013-04-11 ENCOUNTER — Ambulatory Visit: Payer: Medicare HMO | Admitting: Physical Medicine & Rehabilitation

## 2013-04-11 DIAGNOSIS — M76899 Other specified enthesopathies of unspecified lower limb, excluding foot: Secondary | ICD-10-CM | POA: Insufficient documentation

## 2013-04-11 DIAGNOSIS — G8929 Other chronic pain: Secondary | ICD-10-CM | POA: Insufficient documentation

## 2013-04-11 DIAGNOSIS — M4310 Spondylolisthesis, site unspecified: Secondary | ICD-10-CM | POA: Insufficient documentation

## 2013-04-11 DIAGNOSIS — M961 Postlaminectomy syndrome, not elsewhere classified: Secondary | ICD-10-CM | POA: Insufficient documentation

## 2013-05-30 ENCOUNTER — Emergency Department (HOSPITAL_COMMUNITY)
Admission: EM | Admit: 2013-05-30 | Discharge: 2013-05-30 | Disposition: A | Discharge status: Home / Self Care | Payer: Medicare HMO | Attending: Emergency Medicine | Admitting: Emergency Medicine

## 2013-05-30 ENCOUNTER — Encounter (HOSPITAL_COMMUNITY): Payer: Self-pay | Admitting: *Deleted

## 2013-05-30 DIAGNOSIS — G8929 Other chronic pain: Secondary | ICD-10-CM | POA: Insufficient documentation

## 2013-05-30 DIAGNOSIS — Z8639 Personal history of other endocrine, nutritional and metabolic disease: Secondary | ICD-10-CM | POA: Insufficient documentation

## 2013-05-30 DIAGNOSIS — J45909 Unspecified asthma, uncomplicated: Secondary | ICD-10-CM | POA: Insufficient documentation

## 2013-05-30 DIAGNOSIS — F329 Major depressive disorder, single episode, unspecified: Secondary | ICD-10-CM | POA: Insufficient documentation

## 2013-05-30 DIAGNOSIS — Z8673 Personal history of transient ischemic attack (TIA), and cerebral infarction without residual deficits: Secondary | ICD-10-CM | POA: Insufficient documentation

## 2013-05-30 DIAGNOSIS — G43909 Migraine, unspecified, not intractable, without status migrainosus: Secondary | ICD-10-CM | POA: Insufficient documentation

## 2013-05-30 DIAGNOSIS — M545 Low back pain, unspecified: Secondary | ICD-10-CM | POA: Insufficient documentation

## 2013-05-30 DIAGNOSIS — Z87440 Personal history of urinary (tract) infections: Secondary | ICD-10-CM | POA: Insufficient documentation

## 2013-05-30 DIAGNOSIS — M129 Arthropathy, unspecified: Secondary | ICD-10-CM | POA: Insufficient documentation

## 2013-05-30 DIAGNOSIS — IMO0002 Reserved for concepts with insufficient information to code with codable children: Secondary | ICD-10-CM | POA: Insufficient documentation

## 2013-05-30 DIAGNOSIS — F3289 Other specified depressive episodes: Secondary | ICD-10-CM | POA: Insufficient documentation

## 2013-05-30 DIAGNOSIS — Z79899 Other long term (current) drug therapy: Secondary | ICD-10-CM | POA: Insufficient documentation

## 2013-05-30 DIAGNOSIS — Z862 Personal history of diseases of the blood and blood-forming organs and certain disorders involving the immune mechanism: Secondary | ICD-10-CM | POA: Insufficient documentation

## 2013-05-30 DIAGNOSIS — I10 Essential (primary) hypertension: Secondary | ICD-10-CM | POA: Insufficient documentation

## 2013-05-30 MED ORDER — HYDROCODONE-ACETAMINOPHEN 5-325 MG PO TABS: 1.0000 | ORAL_TABLET | ORAL | Status: DC | PRN

## 2013-05-30 MED ORDER — PREDNISONE 20 MG PO TABS: 20.0000 mg | ORAL_TABLET | Freq: Two times a day (BID) | ORAL | Status: DC

## 2013-05-30 MED ORDER — ONDANSETRON HCL 8 MG PO TABS: 8.0000 mg | ORAL_TABLET | Freq: Three times a day (TID) | ORAL | Status: DC | PRN

## 2013-05-30 MED ORDER — KETOROLAC TROMETHAMINE 60 MG/2ML IM SOLN
60.0000 mg | Freq: Once | INTRAMUSCULAR | Status: AC
  Administered 2013-05-30: 60 mg via INTRAMUSCULAR
  Filled 2013-05-30: qty 2

## 2013-05-30 NOTE — ED Provider Notes (Signed)
CSN: [id number]     Arrival date & time 05/30/13  1956 History   None    Chief Complaint  Patient presents with  . Back Pain   (Consider location/radiation/quality/duration/timing/severity/associated sxs/prior Treatment) HPI History provided by pt.   Pt presents w/ acute on chronic, non-traumatic, severe L low back pain w/ radiation down LLE and intermittent paresthesias LLE for the past 2 weeks.  Has remote h/o lumbar fusion and these are the same symptoms he has been dealing with for a long time.  His PCP is in the process of referring him to orthopedics.  Has been taking a half of a percocet on occasion for pain w/ some relief.  Denies fever, bladder/bowel dysfunction and LE weakness.   Past Medical History  Diagnosis Date  . Back pain   . Hypertension   . Asthma   . Depression   . Cluster headache   . Stroke     pt states "mini-Stroke"  . Arthritis   . Blood in stool   . Headache(784.0)     frequent   . Allergy   . Hyperlipidemia   . Migraines   . UTI (urinary tract infection)    Past Surgical History  Procedure Laterality Date  . Back surgery    . Lumbar laminectomy/decompression microdiscectomy  2010  . Anterior and posterior spinal fusion  2011   Family History  Problem Relation Age of Onset  . Heart disease Mother   . Hypertension Mother   . Arthritis Father   . Hypertension Father   . Miscarriages / Stillbirths Maternal Grandmother   . Heart disease Maternal Grandfather   . Sudden death Maternal Grandfather    History  Substance Use Topics  . Smoking status: Never Smoker   . Smokeless tobacco: Not on file  . Alcohol Use: No    Review of Systems  All other systems reviewed and are negative.    Allergies  Review of patient's allergies indicates no known allergies.  Home Medications   Current Outpatient Rx  Name  Route  Sig  Dispense  Refill  . acetaminophen (TYLENOL) 500 MG tablet   Oral   Take 1,500 mg by mouth every 6 (six) hours as needed.  For pain         . citalopram (CELEXA) 10 MG tablet      On tablet daily by mouth   90 tablet   1   . hydrochlorothiazide (HYDRODIURIL) 25 MG tablet   Oral   Take 1 tablet (25 mg total) by mouth daily.   90 tablet   3   . lisinopril (PRINIVIL,ZESTRIL) 10 MG tablet   Oral   Take 1 tablet (10 mg total) by mouth daily.   90 tablet   3   . HYDROcodone-acetaminophen (NORCO/VICODIN) 5-325 MG per tablet   Oral   Take 1 tablet by mouth every 4 (four) hours as needed for pain.   20 tablet   0   . ondansetron (ZOFRAN) 8 MG tablet   Oral   Take 1 tablet (8 mg total) by mouth every 8 (eight) hours as needed for nausea.   20 tablet   0   . predniSONE (DELTASONE) 20 MG tablet   Oral   Take 1 tablet (20 mg total) by mouth 2 (two) times daily.   10 tablet   0    BP 128/97  Pulse 80  Temp(Src) 98.1 F (36.7 C) (Oral)  Resp 18  SpO2 97% Physical Exam  Nursing  note and vitals reviewed. Constitutional: He is oriented to person, place, and time. He appears well-developed and well-nourished.  HENT:  Head: Normocephalic and atraumatic.  Eyes:  Normal appearance  Neck: Normal range of motion.  Cardiovascular: Normal rate and regular rhythm.   Pulmonary/Chest: Effort normal and breath sounds normal.  Genitourinary:  No CVA ttp  Musculoskeletal:  Vertical scar mid-line lumbar spine.  Entire low back non-tender.  Mild tenderness L lateral hip and proximal L buttock only. Full active ROM of LE but pain aggravated by L hip flexion.  No pain w/ internal/external rotation and no tenderness of groin. Diminished but equal patellar reflexes.  No saddle anesthesia. Distal sensation intact.  2+ DP pulses.  Ambulates w/out diffulty.   Neurological: He is alert and oriented to person, place, and time.  Skin: Skin is warm and dry. No rash noted.  Psychiatric: He has a normal mood and affect. His behavior is normal.    ED Course  Procedures (including critical care time) Labs Review Labs  Reviewed - No data to display Imaging Review No results found.  MDM   1. Low back pain    42yo M presents w/ non-traumatic, acute on chronic low back pain x 2 weeks.  On exam, afebrile, no bony tenderness, no NV deficits LEs, ambulatory.  Pt received 60mg  IM toradol for pain.  I referred to NS and pain management.  He understands that he will likely need an MRI sometime in the near future but this has to be done on an outpatient basis.  Prescribed short course of hydrocodone as well as prednisone to trial.  I have no suspicion for malingering.  Return precautions discussed. 11:22 PM     Otilio Miu, PA-C 05/30/13 [phone]

## 2013-05-30 NOTE — ED Notes (Signed)
Patient with low left side pain on the top of his buttock and when he coughs it radiates down to his left leg.  Patient has had two back surgeries.  Patient is ambulatory.  Just states that the last few days it has been getting worse.

## 2013-06-02 NOTE — ED Provider Notes (Signed)
Medical screening examination/treatment/procedure(s) were performed by non-physician practitioner and as supervising physician I was immediately available for consultation/collaboration.    Gilda Crease, MD 06/02/13 [phone]

## 2013-06-12 ENCOUNTER — Ambulatory Visit (INDEPENDENT_AMBULATORY_CARE_PROVIDER_SITE_OTHER): Payer: Medicare HMO | Admitting: Family Medicine

## 2013-06-12 ENCOUNTER — Ambulatory Visit: Payer: Medicare HMO | Admitting: Family Medicine

## 2013-06-12 ENCOUNTER — Encounter: Payer: Self-pay | Admitting: Family Medicine

## 2013-06-12 VITALS — BP 100/70 | Temp 98.3°F | Wt 231.0 lb

## 2013-06-12 DIAGNOSIS — M549 Dorsalgia, unspecified: Secondary | ICD-10-CM

## 2013-06-12 DIAGNOSIS — Z23 Encounter for immunization: Secondary | ICD-10-CM

## 2013-06-12 DIAGNOSIS — E785 Hyperlipidemia, unspecified: Secondary | ICD-10-CM

## 2013-06-12 DIAGNOSIS — R7309 Other abnormal glucose: Secondary | ICD-10-CM

## 2013-06-12 DIAGNOSIS — G8929 Other chronic pain: Secondary | ICD-10-CM

## 2013-06-12 DIAGNOSIS — I10 Essential (primary) hypertension: Secondary | ICD-10-CM

## 2013-06-12 DIAGNOSIS — E669 Obesity, unspecified: Secondary | ICD-10-CM

## 2013-06-12 DIAGNOSIS — F3289 Other specified depressive episodes: Secondary | ICD-10-CM

## 2013-06-12 DIAGNOSIS — R7303 Prediabetes: Secondary | ICD-10-CM

## 2013-06-12 DIAGNOSIS — F329 Major depressive disorder, single episode, unspecified: Secondary | ICD-10-CM

## 2013-06-12 DIAGNOSIS — F32A Depression, unspecified: Secondary | ICD-10-CM

## 2013-06-12 LAB — BASIC METABOLIC PANEL
BUN: 19 mg/dL (ref 6–23)
Calcium: 9.8 mg/dL (ref 8.4–10.5)
Chloride: 102 mEq/L (ref 96–112)
Creatinine, Ser: 1 mg/dL (ref 0.4–1.5)
GFR: 83.64 mL/min (ref >60.00)
Glucose, Bld: 115 mg/dL — ABNORMAL HIGH (ref 70–99)
Potassium: 3.8 mEq/L (ref 3.5–5.1)
Sodium: 137 mEq/L (ref 135–145)

## 2013-06-12 LAB — T4, FREE: Free T4: 0.68 ng/dL (ref 0.60–1.60)

## 2013-06-12 LAB — LIPID PANEL
HDL: 41.3 mg/dL (ref >39.00)
Total CHOL/HDL Ratio: 6
VLDL: 101.2 mg/dL — ABNORMAL HIGH (ref 0.0–40.0)

## 2013-06-12 LAB — LDL CHOLESTEROL, DIRECT: Direct LDL: 133.4 mg/dL

## 2013-06-12 LAB — TSH: TSH: 1.3 u[IU]/mL (ref 0.35–5.50)

## 2013-06-12 MED ORDER — PREDNISONE 20 MG PO TABS: 40.0000 mg | ORAL_TABLET | Freq: Every day | ORAL | Status: DC

## 2013-06-12 MED ORDER — CITALOPRAM HYDROBROMIDE 40 MG PO TABS: ORAL_TABLET | ORAL | Status: DC

## 2013-06-12 MED ORDER — NAPROXEN 500 MG PO TABS: 500.0000 mg | ORAL_TABLET | Freq: Two times a day (BID) | ORAL | Status: DC

## 2013-06-12 NOTE — Progress Notes (Signed)
Chief Complaint  Patient presents with  . Follow-up  . Thyroid concerns    HPI:  ER Follow up:  1) Chronic back pain: -seen in ED 9/16 and told needs to see NS out patient, given prednisone course and hydrocodone in ED but these prescriptions were not sent in per pharmacy - he does not wish to take the hydrocodone but doe swant the prednisone  -her reports back feels about the same since ED visit -however, he actually is followed by PMR (Dr. 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, trochanteric bursitis -he reports: moderate sharp intermittent pain in L leg has worsened over time and radiates into L LE with certain movement, no pain currently -denies: denies fevers, chills, numbness or weakness in legs, bowel or bladder incontinence  2) wants to check thyroid -occ sweats - always has had this, also with his weight  3)migraines - followed by neurology  4)HTN/Prediabetes/hyperrig/dyslipidemia: -on HCTZ and lisinopril -advised lifestyle recs, he was supposed check into bariatric -stable  5)hx depression: -on several medications in the past and did not tolerate -on celexa last visit (reports on 40mg  of this and dose is wrong) and was advised to see psych/get counseling -doing ok on 40mg , denies SI, thought sof self harm  ROS: See pertinent positives and negatives per HPI.  Past Medical History  Diagnosis Date  . Back pain   . Hypertension   . Asthma   . Depression   . Cluster headache   . Stroke     pt states "mini-Stroke"  . Arthritis   . Blood in stool   . Headache(784.0)     frequent   . Allergy   . Hyperlipidemia   . Migraines   . UTI (urinary tract infection)     Past Surgical History  Procedure Laterality Date  . Back surgery    . Lumbar laminectomy/decompression microdiscectomy  2010  . Anterior and posterior spinal fusion  2011    Family History  Problem Relation Age of Onset  . Heart  disease Mother   . Hypertension Mother   . Arthritis Father   . Hypertension Father   . Miscarriages / Stillbirths Maternal Grandmother   . Heart disease Maternal Grandfather   . Sudden death Maternal Grandfather     History   Social History  . Marital Status: Married    Spouse Name: N/A    Number of Children: N/A  . Years of Education: N/A   Social History Main Topics  . Smoking status: Never Smoker   . Smokeless tobacco: None  . Alcohol Use: No  . Drug Use: No  . Sexual Activity: Yes    Birth Control/ Protection: None   Other Topics Concern  . None   Social History Narrative  . None    Current outpatient prescriptions:acetaminophen (TYLENOL) 500 MG tablet, Take 1,500 mg by mouth every 6 (six) hours as needed. For pain, Disp: , Rfl: ;  citalopram (CELEXA) 40 MG tablet, On tablet daily by mouth, Disp: 90 tablet, Rfl: 1;  hydrochlorothiazide (HYDRODIURIL) 25 MG tablet, Take 1 tablet (25 mg total) by mouth daily., Disp: 90 tablet, Rfl: 3 lisinopril (PRINIVIL,ZESTRIL) 10 MG tablet, Take 1 tablet (10 mg total) by mouth daily., Disp: 90 tablet, Rfl: 3;  ondansetron (ZOFRAN) 8 MG tablet, Take 1 tablet (8 mg total) by mouth every 8 (eight) hours as needed for nausea., Disp: 20 tablet, Rfl: 0;  predniSONE (DELTASONE) 20 MG tablet, Take 2 tablets (  40 mg total) by mouth daily., Disp: 10 tablet, Rfl: 0 naproxen (NAPROSYN) 500 MG tablet, Take 1 tablet (500 mg total) by mouth 2 (two) times daily with a meal., Disp: 30 tablet, Rfl: 0  EXAM:  Filed Vitals:   06/12/13 0936  BP: 100/70  Temp: 98.3 F (36.8 C)    Body mass index is 37.3 kg/(m^2).  GENERAL: vitals reviewed and listed above, alert, oriented, appears well hydrated and in no acute distress  HEENT: atraumatic, conjunttiva clear, no obvious abnormalities on inspection of external nose and ears  NECK: no obvious masses on inspection  LUNGS: clear to auscultation bilaterally, no wheezes, rales or rhonchi, good air  movement  CV: HRRR, no peripheral edema  MS: moves all extremities without noticeable abnormality Normal Gait Normal inspection of back, no obvious scoliosis or leg length descrepancy No bony TTP Soft tissue TTP at: R and L lumbar paraspinal muscles -/+ tests: neg trendelenburg,-facet loading, -SLRT, -CLRT, -FABER, -FADIR Normal muscle strength, sensation to light touch and 1+ equal bilat DTRs in LEs bilaterally   PSYCH: pleasant and cooperative, no obvious depression or anxiety  ASSESSMENT AND PLAN:  Discussed the following assessment and plan:  Need for prophylactic vaccination and inoculation against influenza - Plan: Flu Vaccine QUAD 36+ mos PF IM (Fluarix)  Essential hypertension, benign - Plan: Basic metabolic panel  Dyslipidemia - Plan: Lipid panel  Depression - Plan: citalopram (CELEXA) 40 MG tablet -changed rx to 40mg  which is his current dose  Obesity, unspecified - Plan: TSH, T4, Free  Chronic back pain - Plan: naproxen (NAPROSYN) 500 MG tablet, predniSONE (DELTASONE) 20 MG tablet -resent prednisone, not sure why not at pharmacy as appears was sent, discussed risks -also trial of naproxen for bad days -he is to follow up with his specialist  Prediabetes - Plan: Hemoglobin A1c  -Patient advised to return or notify a doctor immediately if symptoms worsen or persist or new concerns arise.  Patient Instructions  -Call Dr. Wynn Banker office for an appointment regarding your back: [phone]  -We have ordered labs or studies at this visit. It can take up to 1-2 weeks for results and processing. We will contact you with instructions IF your results are abnormal. Normal results will be released to your [facility]. If you have not heard from [place] or can not find your results in [facility] in 2 weeks please contact our office.  -As we discussed, we have prescribed a new medication for you at this appointment. We discussed the common and serious potential adverse effects of this  medication and you can review these and more with the pharmacist when you pick up your medication.  Please follow the instructions for use carefully and notify us immediately if you have any problems taking this medication.  -follow up with me in 3-4 months           Devaun Hernandez R.

## 2013-06-12 NOTE — Patient Instructions (Addendum)
-  Call Dr. Wynn Banker office for an appointment regarding your back: [phone]  -We have ordered labs or studies at this visit. It can take up to 1-2 weeks for results and processing. We will contact you with instructions IF your results are abnormal. Normal results will be released to your [facility]. If you have not heard from [place] or can not find your results in [facility] in 2 weeks please contact our office.  -As we discussed, we have prescribed a new medication for you at this appointment. We discussed the common and serious potential adverse effects of this medication and you can review these and more with the pharmacist when you pick up your medication.  Please follow the instructions for use carefully and notify us immediately if you have any problems taking this medication.  -follow up with me in 3-4 months

## 2013-06-13 NOTE — Progress Notes (Signed)
Quick Note:  Called and spoke with pt and pt is aware. Released to mychart as well. ______ 

## 2013-06-15 ENCOUNTER — Encounter: Payer: Self-pay | Admitting: Physical Medicine & Rehabilitation

## 2013-06-15 ENCOUNTER — Ambulatory Visit (HOSPITAL_BASED_OUTPATIENT_CLINIC_OR_DEPARTMENT_OTHER): Payer: Medicare HMO | Admitting: Physical Medicine & Rehabilitation

## 2013-06-15 ENCOUNTER — Encounter: Payer: Medicare HMO | Attending: Physical Medicine & Rehabilitation

## 2013-06-15 VITALS — BP 138/92 | HR 70 | Resp 14 | Ht 65.0 in | Wt 236.0 lb

## 2013-06-15 DIAGNOSIS — IMO0002 Reserved for concepts with insufficient information to code with codable children: Secondary | ICD-10-CM

## 2013-06-15 DIAGNOSIS — M76899 Other specified enthesopathies of unspecified lower limb, excluding foot: Secondary | ICD-10-CM | POA: Insufficient documentation

## 2013-06-15 DIAGNOSIS — M961 Postlaminectomy syndrome, not elsewhere classified: Secondary | ICD-10-CM | POA: Insufficient documentation

## 2013-06-15 DIAGNOSIS — G8929 Other chronic pain: Secondary | ICD-10-CM | POA: Insufficient documentation

## 2013-06-15 DIAGNOSIS — M47817 Spondylosis without myelopathy or radiculopathy, lumbosacral region: Secondary | ICD-10-CM

## 2013-06-15 DIAGNOSIS — M545 Low back pain, unspecified: Secondary | ICD-10-CM

## 2013-06-15 DIAGNOSIS — M5416 Radiculopathy, lumbar region: Secondary | ICD-10-CM

## 2013-06-15 DIAGNOSIS — M4310 Spondylolisthesis, site unspecified: Secondary | ICD-10-CM | POA: Insufficient documentation

## 2013-06-15 HISTORY — DX: Spondylosis without myelopathy or radiculopathy, lumbosacral region: M47.817

## 2013-06-15 NOTE — Patient Instructions (Addendum)
If shooting pains down the left thigh and leg re occur, call to schedule a left L4-L5 transforaminal epidural steroid injection  You may call our office if you need refills on naproxen

## 2013-06-15 NOTE — Progress Notes (Signed)
Subjective:    Patient ID: Gary Clark, male    DOB: 04-03-1971, 42 y.o.   MRN: [id number] CC: Hip and back pain since 2010  HPI  Work related injury in sept 2010. Hurt back and underwent L4-5 decompressive laminectomy in Dec 2010, numbness in left leg worsened and went on to lumbar fusion July 2011 L4-5 Anterior and Posterior.Left leg numbness improved but now over the last 6-8 months increasing with pain once again. Seen by neurosurgery Jan 2013, second opinion no further surgery recommended  Repeat CT myelogram in 2012 demonstrated dynamic retrolisthesis at L3-4, exacerbated by extension.  No postoperative injections for physical therapy.  Patient has gained 60 pounds since 2010. Has not returned to work.  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  Takes over-the-counter Tylenol and ibuprofen  HPI  Left hip injection didn't help shooting pain down left leg On prednisone last day as well as naproxen.  Denies GI upset Pain Inventory Average Pain 6 Pain Right Now 5 My pain is intermittent, sharp and stabbing  In the last 24 hours, has pain interfered with the following? General activity 6 Relation with others 9 Enjoyment of life 8 What TIME of day is your pain at its worst? morning, daytime Sleep (in general) Fair  Pain is worse with: walking, bending, sitting and inactivity Pain improves with: medication Relief from Meds: 5  Mobility walk without assistance how many minutes can you walk? 30-40 ability to climb steps?  yes do you drive?  yes  Function disabled: date disabled 08-23-09  Neuro/Psych depression anxiety  Prior Studies Any changes since last visit?  no  Physicians involved in your care Any changes since last visit?  no   Family History  Problem Relation Age of Onset  . Heart disease Mother   . Hypertension Mother   . Arthritis Father   . Hypertension Father   . Miscarriages /  Stillbirths Maternal Grandmother   . Heart disease Maternal Grandfather   . Sudden death Maternal Grandfather    History   Social History  . Marital Status: Married    Spouse Name: N/A    Number of Children: N/A  . Years of Education: N/A   Social History Main Topics  . Smoking status: Never Smoker   . Smokeless tobacco: None  . Alcohol Use: No  . Drug Use: No  . Sexual Activity: Yes    Birth Control/ Protection: None   Other Topics Concern  . None   Social History Narrative  . None   Past Surgical History  Procedure Laterality Date  . Back surgery    . Lumbar laminectomy/decompression microdiscectomy  2010  . Anterior and posterior spinal fusion  2011   Past Medical History  Diagnosis Date  . Back pain   . Hypertension   . Asthma   . Depression   . Cluster headache   . Stroke     pt states "mini-Stroke"  . Arthritis   . Blood in stool   . Headache(784.0)     frequent   . Allergy   . Hyperlipidemia   . Migraines   . UTI (urinary tract infection)    BP 138/92  Pulse 70  Resp 14  Ht 5\' 5"  (1.651 m)  Wt 236 lb (107.049 kg)  BMI 39.27 kg/m2  SpO2 97%     Review of Systems  Constitutional: Positive for unexpected weight change.  Psychiatric/Behavioral: Positive for  dysphoric mood. The patient is nervous/anxious.   All other systems reviewed and are negative.       Objective:   Physical Exam  Nursing note and vitals reviewed. Constitutional: He is oriented to person, place, and time. He appears well-developed and well-nourished.  HENT:  Head: Normocephalic and atraumatic.  Eyes: Conjunctivae are normal. Pupils are equal, round, and reactive to light.  Neurological: He is alert and oriented to person, place, and time. He has normal strength. No sensory deficit.  Reflex Scores:      Patellar reflexes are 1+ on the right side and 2+ on the left side.      Achilles reflexes are 1+ on the right side and 1+ on the left side. Equivocal femoral  stretch test on left  Psychiatric: He has a normal mood and affect.   no tenderness over the trochanteric bursae        Assessment & Plan:  1. Lumbar post laminectomy syndrome. With chronic pain and intermittent radicular symptoms improving with prednisone and Naproxen. He will discontinue the prednisone today and remain on naproxen. If he has recurrence of left lower extremity shooting pains then I would recommend an epidural steroid injection. He would know this by the end of next week most likely 2. L3-L4 retrolisthesis causing facet mediated pain. He may benefit from lumbar medial branch blocks and if these give only a short term relief then lumbar radio frequency neurotomy  3. Trochanteric bursitis left inproved Patient will contact us if the radicular pain returns and we will schedule the epidural. Would continue the naproxen for one month and then discontinue. We discussed that medication is probably better for flareups rather than for ongoing use

## 2013-09-21 ENCOUNTER — Telehealth: Payer: Self-pay | Admitting: Family Medicine

## 2013-09-21 NOTE — Telephone Encounter (Signed)
Patient Information:  Caller Name: Dorene SorrowJerry  Phone: [phone]  Patient: Gary Clark, Gary Clark  Gender: Male  DOB: 11-09-1970  Age: [age] Years  PCP: Selena BattenKim (TEXT 1st, after 20 mins can call), Dahlia ClientHannah [facility](Family Practice)  Office Follow Up:  Does the office need to follow up with this patient?: No  Instructions For The Office: N/A   Symptoms  Reason For Call & Symptoms: When he is having intercourse and is about to ejaculate he gets a severe pain in the back  and sides of his head that lasts for about 10-15 minutes.   He has been embarrased to discuss. This and has happened over the last month.  Does he need to see Dr. Selena BattenKim or another type Dr?  Reviewed Health History In EMR: Yes  Reviewed Medications In EMR: Yes  Reviewed Allergies In EMR: Yes  Reviewed Surgeries / Procedures: Yes  Date of Onset of Symptoms: 08/28/2013  Guideline(s) Used:  Headache  Disposition Per Guideline:   See Within 2 Weeks in Office  Reason For Disposition Reached:   Headache is a chronic symptom (recurrent or ongoing AND lasting > 4 weeks)  Advice Given:  N/A  Patient Will Follow Care Advice:  YES  Appointment Scheduled:  09/22/2013 09:00:00 Appointment Scheduled Provider:  Selena BattenKim (TEXT 1st, after 20 mins can call), Dahlia ClientHannah [facility](Family Practice)

## 2013-09-22 ENCOUNTER — Encounter: Payer: Self-pay | Admitting: Family Medicine

## 2013-09-22 NOTE — Progress Notes (Signed)
error    This encounter was created in error - please disregard.

## 2013-10-31 ENCOUNTER — Ambulatory Visit: Payer: Self-pay | Admitting: Diagnostic Neuroimaging

## 2014-01-02 ENCOUNTER — Encounter: Payer: Self-pay | Admitting: Physical Medicine & Rehabilitation

## 2014-01-02 ENCOUNTER — Encounter: Payer: Medicare HMO | Attending: Physical Medicine & Rehabilitation

## 2014-01-02 ENCOUNTER — Ambulatory Visit (HOSPITAL_BASED_OUTPATIENT_CLINIC_OR_DEPARTMENT_OTHER): Payer: Medicare HMO | Admitting: Physical Medicine & Rehabilitation

## 2014-01-02 VITALS — BP 139/92 | HR 70 | Resp 14 | Wt 235.0 lb

## 2014-01-02 DIAGNOSIS — M545 Low back pain, unspecified: Secondary | ICD-10-CM | POA: Insufficient documentation

## 2014-01-02 DIAGNOSIS — E785 Hyperlipidemia, unspecified: Secondary | ICD-10-CM | POA: Insufficient documentation

## 2014-01-02 DIAGNOSIS — G8929 Other chronic pain: Secondary | ICD-10-CM | POA: Insufficient documentation

## 2014-01-02 DIAGNOSIS — IMO0002 Reserved for concepts with insufficient information to code with codable children: Secondary | ICD-10-CM | POA: Insufficient documentation

## 2014-01-02 DIAGNOSIS — M5416 Radiculopathy, lumbar region: Secondary | ICD-10-CM

## 2014-01-02 DIAGNOSIS — M47817 Spondylosis without myelopathy or radiculopathy, lumbosacral region: Secondary | ICD-10-CM

## 2014-01-02 DIAGNOSIS — I10 Essential (primary) hypertension: Secondary | ICD-10-CM | POA: Insufficient documentation

## 2014-01-02 DIAGNOSIS — Z8673 Personal history of transient ischemic attack (TIA), and cerebral infarction without residual deficits: Secondary | ICD-10-CM | POA: Insufficient documentation

## 2014-01-02 DIAGNOSIS — M961 Postlaminectomy syndrome, not elsewhere classified: Secondary | ICD-10-CM | POA: Insufficient documentation

## 2014-01-02 DIAGNOSIS — G43909 Migraine, unspecified, not intractable, without status migrainosus: Secondary | ICD-10-CM | POA: Insufficient documentation

## 2014-01-02 DIAGNOSIS — J45909 Unspecified asthma, uncomplicated: Secondary | ICD-10-CM | POA: Insufficient documentation

## 2014-01-02 NOTE — Patient Instructions (Signed)
Will need driver next visit 

## 2014-01-02 NOTE — Progress Notes (Signed)
Subjective:    Patient ID: Gary KeensJerry Iezzi, male    DOB: 02/05/1971, 43 y.o.   MRN: [id number] Work related injury in sept 2010. Hurt back and underwent L4-5 decompressive laminectomy in Dec 2010, numbness in left leg worsened and went on to lumbar fusion July 2011 L4-5 Anterior and Posterior.Left leg numbness improved but now over the last 6-8 months increasing with pain once again. Seen by neurosurgery Jan 2013, second opinion no further surgery recommended  Repeat CT myelogram in 2012 demonstrated dynamic retrolisthesis at L3-4, exacerbated by extension.   HPI This and tingling in the left toes. Also left-sided low back pain which is increasing.  Ibuprofen 800mg  TID on a regular basis Pain Inventory Average Pain 6 Pain Right Now 6 My pain is constant, sharp, burning and tingling  In the last 24 hours, has pain interfered with the following? General activity 7 Relation with others 7 Enjoyment of life 8 What TIME of day is your pain at its worst? morning and daytime Sleep (in general) Fair  Pain is worse with: standing and unsure Pain improves with: rest Relief from Meds: 0  Mobility walk without assistance how many minutes can you walk? 15 ability to climb steps?  yes do you drive?  yes  Function disabled: date disabled 2010 I need assistance with the following:  household duties  Neuro/Psych numbness tingling depression anxiety  Prior Studies Any changes since last visit?  no  Physicians involved in your care Any changes since last visit?  no   Family History  Problem Relation Age of Onset  . Heart disease Mother   . Hypertension Mother   . Arthritis Father   . Hypertension Father   . Miscarriages / Stillbirths Maternal Grandmother   . Heart disease Maternal Grandfather   . Sudden death Maternal Grandfather    History   Social History  . Marital Status: Married    Spouse Name: N/A    Number of Children: N/A  . Years of Education: N/A   Social  History Main Topics  . Smoking status: Never Smoker   . Smokeless tobacco: None  . Alcohol Use: No  . Drug Use: No  . Sexual Activity: Yes    Birth Control/ Protection: None   Other Topics Concern  . None   Social History Narrative  . None   Past Surgical History  Procedure Laterality Date  . Back surgery    . Lumbar laminectomy/decompression microdiscectomy  2010  . Anterior and posterior spinal fusion  2011   Past Medical History  Diagnosis Date  . Back pain   . Hypertension   . Asthma   . Depression   . Cluster headache   . Stroke     pt states "mini-Stroke"  . Arthritis   . Blood in stool   . Headache(784.0)     frequent   . Allergy   . Hyperlipidemia   . Migraines   . UTI (urinary tract infection)    BP 139/92  Pulse 70  Resp 14  Wt 235 lb (106.595 kg)  SpO2 97%  Opioid Risk Score:   Fall Risk Score:     Review of Systems  Neurological: Positive for numbness.       Tingling  Psychiatric/Behavioral: Positive for dysphoric mood. The patient is nervous/anxious.   All other systems reviewed and are negative.      Objective:   Physical Exam  Mildly reduced pinprick left L5 distribution otherwise pinprick sensation equal right vs. left  Absent right patellar reflex 2+ left patellar reflex 1+ bilateral Achilles reflex No tenderness over the greater trochanter of the hip Lumbar range of motion 50% flexion, extension, lateral bending no pain with end range range of motion Tenderness to palpation over bilateral L5 paraspinal Negative straight leg raise test Spine has healed scars from lumbar fusion      Assessment & Plan:  1. Lumbar post laminectomy syndrome. With chronic pain and intermittent radicular symptoms improving with prednisone and Naproxen. He will discontinue the prednisone today and remain on naproxen. If he has recurrence of left lower extremity shooting pains then I would recommend an epidural steroid injection. He would know this by the  end of next week most likely  2. L3-L4 retrolisthesis causing facet mediated pain. He may benefit from lumbar medial branch blocks and if these give only a short term relief then lumbar radio frequency neurotomy

## 2014-01-07 ENCOUNTER — Encounter (HOSPITAL_COMMUNITY): Payer: Self-pay | Admitting: Emergency Medicine

## 2014-01-07 ENCOUNTER — Emergency Department (HOSPITAL_COMMUNITY)
Admission: EM | Admit: 2014-01-07 | Discharge: 2014-01-07 | Disposition: A | Discharge status: Home / Self Care | Payer: Medicare HMO | Attending: Emergency Medicine | Admitting: Emergency Medicine

## 2014-01-07 DIAGNOSIS — M129 Arthropathy, unspecified: Secondary | ICD-10-CM | POA: Insufficient documentation

## 2014-01-07 DIAGNOSIS — Z8673 Personal history of transient ischemic attack (TIA), and cerebral infarction without residual deficits: Secondary | ICD-10-CM | POA: Insufficient documentation

## 2014-01-07 DIAGNOSIS — Z87440 Personal history of urinary (tract) infections: Secondary | ICD-10-CM | POA: Insufficient documentation

## 2014-01-07 DIAGNOSIS — Z79899 Other long term (current) drug therapy: Secondary | ICD-10-CM | POA: Insufficient documentation

## 2014-01-07 DIAGNOSIS — G43909 Migraine, unspecified, not intractable, without status migrainosus: Secondary | ICD-10-CM | POA: Insufficient documentation

## 2014-01-07 DIAGNOSIS — I10 Essential (primary) hypertension: Secondary | ICD-10-CM | POA: Insufficient documentation

## 2014-01-07 DIAGNOSIS — E785 Hyperlipidemia, unspecified: Secondary | ICD-10-CM | POA: Insufficient documentation

## 2014-01-07 DIAGNOSIS — J45909 Unspecified asthma, uncomplicated: Secondary | ICD-10-CM | POA: Insufficient documentation

## 2014-01-07 DIAGNOSIS — K029 Dental caries, unspecified: Secondary | ICD-10-CM | POA: Insufficient documentation

## 2014-01-07 DIAGNOSIS — F329 Major depressive disorder, single episode, unspecified: Secondary | ICD-10-CM | POA: Insufficient documentation

## 2014-01-07 DIAGNOSIS — F3289 Other specified depressive episodes: Secondary | ICD-10-CM | POA: Insufficient documentation

## 2014-01-07 MED ORDER — IBUPROFEN 800 MG PO TABS: 800.0000 mg | ORAL_TABLET | Freq: Three times a day (TID) | ORAL | Status: DC | PRN

## 2014-01-07 MED ORDER — HYDROCODONE-ACETAMINOPHEN 5-325 MG PO TABS: 1.0000 | ORAL_TABLET | Freq: Four times a day (QID) | ORAL | Status: DC | PRN

## 2014-01-07 MED ORDER — PENICILLIN V POTASSIUM 500 MG PO TABS: 500.0000 mg | ORAL_TABLET | Freq: Four times a day (QID) | ORAL | Status: DC

## 2014-01-07 NOTE — ED Provider Notes (Signed)
Medical screening examination/treatment/procedure(s) were performed by non-physician practitioner and as supervising physician I was immediately available for consultation/collaboration.   EKG Interpretation None      Icker Swigert, MD, FACEP   Zayan Delvecchio L Clemon Devaul, MD 01/07/14 2300 

## 2014-01-07 NOTE — ED Notes (Signed)
The pt is c/o a toothache for a long time worse for the past few days

## 2014-01-07 NOTE — Discharge Instructions (Signed)
Return here as needed.  Follow-up with the dentist provided.  Rinse with warm water and peroxide 3 times a day. °

## 2014-01-07 NOTE — ED Provider Notes (Signed)
CSN: [id number]     Arrival date & time 01/07/14  1911 History   None    This chart was scribed for non-physician practitioner, Ebbie Ridgehris Arsh Feutz PA-C working with Ward GivensIva L Knapp, MD by Arlan OrganAshley Leger, ED Scribe. This patient was seen in room TR05C/TR05C and the patient's care was started at 7:34 PM.   No chief complaint on file.  The history is provided by the patient. No language interpreter was used.    HPI Comments: Gary Clark is a 43 y.o. male with a PMHx of HTN and hyperlipidemia who presents to the Emergency Department complaining of constant, moderate dental pain x 2 days that is progressively worsening. He also reports an associated HA he has noted. He has not tried anything OTC for his current discomfort. At this time he denies any fever, chills, or other associated symptoms. Pt states he has been unable to follow up with a dentist due to cost. Currently not followed by a dentist. Pt has no other pertinent medical history. No other concerns this visit.  Past Medical History  Diagnosis Date  . Back pain   . Hypertension   . Asthma   . Depression   . Cluster headache   . Stroke     pt states "mini-Stroke"  . Arthritis   . Blood in stool   . Headache(784.0)     frequent   . Allergy   . Hyperlipidemia   . Migraines   . UTI (urinary tract infection)    Past Surgical History  Procedure Laterality Date  . Back surgery    . Lumbar laminectomy/decompression microdiscectomy  2010  . Anterior and posterior spinal fusion  2011   Family History  Problem Relation Age of Onset  . Heart disease Mother   . Hypertension Mother   . Arthritis Father   . Hypertension Father   . Miscarriages / Stillbirths Maternal Grandmother   . Heart disease Maternal Grandfather   . Sudden death Maternal Grandfather    History  Substance Use Topics  . Smoking status: Never Smoker   . Smokeless tobacco: Not on file  . Alcohol Use: No    Review of Systems  Constitutional: Negative for fever and  chills.  HENT: Positive for dental problem. Negative for congestion.   Eyes: Negative for redness.  Respiratory: Negative for cough.   Skin: Negative for pallor.  Psychiatric/Behavioral: Negative for confusion.      Allergies  Review of patient's allergies indicates no known allergies.  Home Medications   Prior to Admission medications   Medication Sig Start Date End Date Taking? Authorizing Provider  acetaminophen (TYLENOL) 500 MG tablet Take 1,500 mg by mouth every 6 (six) hours as needed. For pain    Historical Provider, MD  citalopram (CELEXA) 40 MG tablet On tablet daily by mouth 06/12/13   Terressa KoyanagiHannah R. Kim, DO  hydrochlorothiazide (HYDRODIURIL) 25 MG tablet Take 1 tablet (25 mg total) by mouth daily. 11/22/12   Terressa KoyanagiHannah R. Kim, DO  ibuprofen (ADVIL,MOTRIN) 200 MG tablet  10/30/13   Historical Provider, MD  lisinopril (PRINIVIL,ZESTRIL) 10 MG tablet Take 1 tablet (10 mg total) by mouth daily. 11/22/12   Terressa KoyanagiHannah R. Kim, DO  loratadine (CLARITIN) 10 MG tablet  12/23/13   Historical Provider, MD   Triage Vitals: BP 152/106  Pulse 75  Temp(Src) 98.8 F (37.1 C) (Oral)  Resp 16  Ht 5\' 6"  (1.676 m)  Wt 235 lb 3 oz (106.68 kg)  BMI 37.98 kg/m2  SpO2 97%  Physical Exam  Nursing note and vitals reviewed. Constitutional: He is oriented to person, place, and time. He appears well-developed and well-nourished.  HENT:  Head: Normocephalic and atraumatic.  Patient has significant dental decay to the right upper first molar is no signs of abscess  Eyes: EOM are normal.  Neck: Normal range of motion. Neck supple. No thyromegaly present.  Pulmonary/Chest: Effort normal.  Musculoskeletal: Normal range of motion.  Lymphadenopathy:    He has no cervical adenopathy.  Neurological: He is alert and oriented to person, place, and time.  Skin: Skin is warm and dry.  Psychiatric: He has a normal mood and affect. His behavior is normal.    ED Course  Procedures (including critical care  time)  DIAGNOSTIC STUDIES: Oxygen Saturation is 97% on RA, Normal by my interpretation.    COORDINATION OF CARE: 7:38 PM-Discussed treatment plan with pt at bedside and pt agreed to plan.      Patient referred to dentistry.  Told to return here as needed.  For signs of dental abscess.  At this point, but there is some mild swelling to the facial area  I personally performed the services described in this documentation, which was scribed in my presence. The recorded information has been reviewed and is accurate.    Carlyle Dollyhristopher W Cleon Thoma, PA-C 01/07/14 1955

## 2014-01-08 ENCOUNTER — Other Ambulatory Visit: Payer: Self-pay | Admitting: Family Medicine

## 2014-01-08 MED ORDER — LISINOPRIL 10 MG PO TABS: ORAL_TABLET | ORAL | Status: DC

## 2014-01-08 NOTE — Addendum Note (Signed)
Addended by: Peter CongoKILLINGSWORTH, Mell Mellott T on: 01/08/2014 01:35 PM   Modules accepted: Orders

## 2014-01-08 NOTE — Telephone Encounter (Signed)
[facility]

## 2014-01-08 NOTE — Telephone Encounter (Signed)
Pt is needing new rx lisinopril (PRINIVIL,ZESTRIL) 10 MG tablet, states he is completely out, send to [facility] [place].

## 2014-01-24 ENCOUNTER — Telehealth: Payer: Self-pay | Admitting: Neurology

## 2014-01-24 ENCOUNTER — Encounter: Payer: Self-pay | Admitting: Diagnostic Neuroimaging

## 2014-01-24 ENCOUNTER — Telehealth: Payer: Self-pay | Admitting: Family Medicine

## 2014-01-24 ENCOUNTER — Ambulatory Visit (INDEPENDENT_AMBULATORY_CARE_PROVIDER_SITE_OTHER): Payer: Commercial Managed Care - HMO | Admitting: Diagnostic Neuroimaging

## 2014-01-24 VITALS — BP 132/95 | HR 73 | Ht 66.0 in | Wt 235.0 lb

## 2014-01-24 DIAGNOSIS — R0683 Snoring: Secondary | ICD-10-CM

## 2014-01-24 DIAGNOSIS — E669 Obesity, unspecified: Secondary | ICD-10-CM

## 2014-01-24 DIAGNOSIS — G4710 Hypersomnia, unspecified: Secondary | ICD-10-CM

## 2014-01-24 DIAGNOSIS — R519 Headache, unspecified: Secondary | ICD-10-CM

## 2014-01-24 DIAGNOSIS — R0609 Other forms of dyspnea: Secondary | ICD-10-CM

## 2014-01-24 DIAGNOSIS — G44209 Tension-type headache, unspecified, not intractable: Secondary | ICD-10-CM

## 2014-01-24 DIAGNOSIS — R400 Somnolence: Secondary | ICD-10-CM

## 2014-01-24 DIAGNOSIS — G4482 Headache associated with sexual activity: Secondary | ICD-10-CM

## 2014-01-24 DIAGNOSIS — G43009 Migraine without aura, not intractable, without status migrainosus: Secondary | ICD-10-CM

## 2014-01-24 DIAGNOSIS — R0989 Other specified symptoms and signs involving the circulatory and respiratory systems: Secondary | ICD-10-CM

## 2014-01-24 DIAGNOSIS — R51 Headache: Secondary | ICD-10-CM

## 2014-01-24 DIAGNOSIS — G4719 Other hypersomnia: Secondary | ICD-10-CM

## 2014-01-24 MED ORDER — TOPIRAMATE 50 MG PO TABS: 50.0000 mg | ORAL_TABLET | Freq: Two times a day (BID) | ORAL | Status: DC

## 2014-01-24 NOTE — Telephone Encounter (Signed)
Dr.Penumalli, refers patient for attended sleep study.  Height: 5'6  Weight: 235 lbs.  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     Medications:  Acetaminophen (Tab) TYLENOL 500 MG Take 1,500 mg by mouth every 6 (six) hours as needed. For pain Fluticasone Propionate (Suspension) FLONASE 50 MCG/ACT Hydrochlorothiazide (Tab) HYDRODIURIL 25 MG Take 1 tablet (25 mg total) by mouth daily. Hydrocodone-Acetaminophen (Tab) NORCO/VICODIN 5-325 MG Take 1 tablet by mouth every 6 (six) hours as needed for moderate pain. Ibuprofen (Tab) ADVIL,MOTRIN 200 MG 200 mg 4 (four) times daily. Ibuprofen (Tab) ADVIL,MOTRIN 800 MG Take 1 tablet (800 mg total) by mouth every 8 (eight) hours as needed. Lisinopril (Tab) PRINIVIL,ZESTRIL 10 MG TAKE 1 TABLET EVERY DAY Loratadine (Tab) CLARITIN 10 MG Nutritional Supplements COLD AND FLU PO Take 1 capsule by mouth daily. Penicillin V Potassium (Tab) VEETID 500 MG Take 1 tablet (500 mg total) by mouth 4 (four) times daily. Topiramate (Tab) TOPAMAX 50 MG Take 1 tablet (50 mg total) by mouth 2 (two) times daily.   Insurance[facility]  Dr. Richrd HumblesPenumalli's Assessment and Plan: 43 y.o. year old male here with intermittent headaches since 2010. Mixed types, including migraine, tension and coital HA.  PLAN:  1. Trial of topiramate for HA prevention  2. sleep study referral (snoring, restlessness, poor sleep, fatigue, obesity BMI 38).   Please review patient information and submit instructions for scheduling and orders for sleep technologist.  Thank you!

## 2014-01-24 NOTE — Patient Instructions (Signed)
Start topiramate 50mg  at bedtime for 2 weeks; then increase to twice a day.  Drink plenty of water with topiramate.  I will order sleep study.

## 2014-01-24 NOTE — Progress Notes (Signed)
[facility]  PATIENT: Gary Clark DOB: Mar 29, 1971  REFERRING CLINICIAN:  HISTORY FROM: patient  REASON FOR VISIT: follow up   HISTORICAL  CHIEF COMPLAINT:  Chief Complaint  Patient presents with  . Follow-up    headache    HISTORY OF PRESENT ILLNESS:   UPDATE 01/24/14: Since last visit, HA are stable. Having 2-3 days of mild tension headache per week. Also 1-2 days severe migraine headache per week. Tried sumatriptan prn without relief. Tried amitriptyline in past, but now off. Also reports intermittent coital headaches, with 15 minutes severe pressure and pain. Now he and wife are afraid of engaging in sexual activity because of fear of triggering severe headache, stroke, elevated BP etc. Still with snoring, daytime fatigue, agitation, mood issues. Also continues to struggle with weight, low back pain, lumbar radiculopathy symptoms.  PRIOR HPI (10/24/12): 43 year old right-handed male with history of hypertension, hypercholesterolemia, migraine, depression, anxiety, here for evaluation of headaches.  Around 2010, patient began to have intermittent headaches, on the top of his head, side of his head, posterior neck and occipital region. Headaches typically affected left greater than right side. He describes a sharp and squeezing pain, associated with nausea. No scintillating scotoma, numbness, weakness, photophobia or phonophobia with the headaches. He has low level daily HA, and 2 severe HA per week. HA last 1 hour up to whole day.   Patient [facility], treated with trigger point injections without relief. Patient tried tramadol without significant relief.  Her past 3 weeks patient has been on amitriptyline 25 mg at night, to help with anxiety symptoms. He is having a difficult time tolerating this dose because he feels too sleepy during the daytime. He has not tried sumatriptan or Topamax. He tells me that he has  never had a CT or MRI of the brain.  REVIEW OF SYSTEMS: Full 14 system review of systems performed and notable only for cough snoring back pain aching muscles agitation depression headache.  ALLERGIES: No Known Allergies  HOME MEDICATIONS: Outpatient Prescriptions Prior to Visit  Medication Sig Dispense Refill  . acetaminophen (TYLENOL) 500 MG tablet Take 1,500 mg by mouth every 6 (six) hours as needed. For pain      . hydrochlorothiazide (HYDRODIURIL) 25 MG tablet Take 1 tablet (25 mg total) by mouth daily.  90 tablet  3  . HYDROcodone-acetaminophen (NORCO/VICODIN) 5-325 MG per tablet Take 1 tablet by mouth every 6 (six) hours as needed for moderate pain.  15 tablet  0  . ibuprofen (ADVIL,MOTRIN) 200 MG tablet 200 mg 4 (four) times daily.       Marland Kitchen ibuprofen (ADVIL,MOTRIN) 800 MG tablet Take 1 tablet (800 mg total) by mouth every 8 (eight) hours as needed.  21 tablet  0  . lisinopril (PRINIVIL,ZESTRIL) 10 MG tablet TAKE 1 TABLET EVERY DAY  90 tablet  3  . loratadine (CLARITIN) 10 MG tablet       . Nutritional Supplements (COLD AND FLU PO) Take 1 capsule by mouth daily.      . penicillin v potassium (VEETID) 500 MG tablet Take 1 tablet (500 mg total) by mouth 4 (four) times daily.  28 tablet  0   No facility-administered medications prior to visit.    PAST MEDICAL HISTORY: Past Medical History  Diagnosis Date  . Back pain   . Hypertension   . Asthma   . Depression   . Cluster headache   . 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 Arthritis   . Blood in stool   . Headache(784.0)     frequent   . Allergy   . Hyperlipidemia   . Migraines   . UTI (urinary tract infection)     PAST SURGICAL HISTORY: Past Surgical History  Procedure Laterality Date  . Back surgery    . Lumbar laminectomy/decompression microdiscectomy  2010  . Anterior and posterior spinal fusion  2011    FAMILY HISTORY: Family History  Problem Relation Age of Onset  . Heart disease Mother   . Hypertension  Mother   . Arthritis Father   . Hypertension Father   . Prostate cancer Father   . Miscarriages / Stillbirths Maternal Grandmother   . Heart disease Maternal Grandfather   . Sudden death Maternal Grandfather   . Breast cancer Sister   . Migraines Sister     SOCIAL HISTORY:  History   Social History  . Marital Status: Married    Spouse Name: [name]    Number of Children: 1  . Years of Education: 12th   Occupational History  .      n/a   Social History Main Topics  . Smoking status: Never Smoker   . Smokeless tobacco: Never Used  . Alcohol Use: Yes     Comment: once a month  . Drug Use: No  . Sexual Activity: Yes    Birth Control/ Protection: None   Other Topics Concern  . Not on file   Social History Narrative   Patient lives at home with his family.   Caffeine Use: 2 cups daily     PHYSICAL EXAM  Filed Vitals:   01/24/14 0834  BP: 132/95  Pulse: 73  Height: $Remove'5\' 6"'YUbzIEF$  (1.676 m)  Weight: 235 lb (106.595 kg)    Not recorded    Body mass index is 37.95 kg/(m^2).  GENERAL EXAM: Patient is in no distress; well developed, nourished and groomed; neck is supple  CARDIOVASCULAR: Regular rate and rhythm, no murmurs, no carotid bruits  NEUROLOGIC: MENTAL STATUS: awake, alert, oriented to person, place and time, recent and remote memory intact, normal attention and concentration, language fluent, comprehension intact, naming intact, fund of knowledge appropriate CRANIAL NERVE: no papilledema on fundoscopic exam, pupils equal and reactive to light, visual fields full to confrontation, extraocular muscles intact, no nystagmus, facial sensation and strength symmetric, hearing intact, palate elevates symmetrically, uvula midline, shoulder shrug symmetric, tongue midline. MOTOR: normal bulk and tone, full strength in the BUE, BLE SENSORY: normal and symmetric to light touch, temperature, vibration COORDINATION: finger-nose-finger, fine finger movements normal REFLEXES:  deep tendon reflexes present and symmetric GAIT/STATION: narrow based gait; able to walk tandem; romberg is negative    DIAGNOSTIC DATA (LABS, IMAGING, TESTING) - I reviewed patient records, labs, notes, testing and imaging myself where available.  Lab Results  Component Value Date   WBC 6.5 03/24/2010   HGB 11.4* 03/30/2010   HCT 32.5* 03/30/2010   MCV 95.9 03/24/2010   PLT 236 03/24/2010      Component Value Date/Time   NA 137 06/12/2013 1011   K 3.8 06/12/2013 1011   CL 102 06/12/2013 1011   CO2 28 06/12/2013 1011   GLUCOSE 115* 06/12/2013 1011   BUN 19 06/12/2013 1011   CREATININE 1.0 06/12/2013 1011   CALCIUM 9.8 06/12/2013 1011   PROT 7.4 06/10/2012 0930   ALBUMIN 4.2 06/10/2012 0930   AST 25 06/10/2012 0930   ALT 40 06/10/2012 0930  ALKPHOS 55 06/10/2012 0930   BILITOT 0.7 06/10/2012 0930   GFRNONAA >60 03/30/2010 0510   GFRAA  Value: >60        The eGFR has been calculated using the MDRD equation. This calculation has not been validated in all clinical situations. eGFR's persistently <60 mL/min signify possible Chronic Kidney Disease. 03/30/2010 0510   Lab Results  Component Value Date   CHOL 262* 06/12/2013   HDL 41.30 06/12/2013   LDLDIRECT 133.4 06/12/2013   TRIG 506.0* 06/12/2013   CHOLHDL 6 06/12/2013   Lab Results  Component Value Date   HGBA1C 6.1 06/12/2013   No results found for this basename: VITAMINB12   Lab Results  Component Value Date   TSH 1.30 06/12/2013    I reviewed images myself and agree with interpretation. -[facility]  11/07/12 MRI brain (without contrast) - few periventricular and subcortical foci of non-specific gliosis   ASSESSMENT AND PLAN  43 y.o. year old male here with intermittent headaches since 2010. Mixed types, including migraine, tension and coital HA.   PLAN: 1. Trial of topiramate for HA prevention 2. sleep study referral (snoring, restlessness, poor sleep, fatigue, obesity BMI 38).  Orders Placed This Encounter  Procedures  . Ambulatory  referral to Sleep Studies   Meds ordered this encounter  Medications  . topiramate (TOPAMAX) 50 MG tablet    Sig: Take 1 tablet (50 mg total) by mouth 2 (two) times daily.    Dispense:  60 tablet    Refill:  12   Return in about 6 months (around 07/27/2014).    Penni Bombard, MD 2/51/8984, 2:10 AM Certified in Neurology, Neurophysiology and Neuroimaging  [facility] [address] [place], [place] [address] [phone]

## 2014-01-24 NOTE — Telephone Encounter (Signed)
Sleep study request review: This patient has an underlying medical history of chronic back pain, hypertension, asthma, depression, arthritis, cluster headaches and complex headaches, obesity and history of "mini stroke" and is referred by Dr. Marjory LiesPenumalli for an attended sleep study due to a report of snoring, restlessness, poor sleep, fatigue, obesity, EDS and recurrent severe headaches. I will order a split-night sleep study and see the patient in sleep medicine consultation afterwards. Huston FoleySaima Larrie Fraizer, MD, PhD [facility])

## 2014-01-24 NOTE — Telephone Encounter (Signed)
pt needs [employer] referral from [facility]. This is for a fup

## 2014-02-08 ENCOUNTER — Encounter: Payer: Self-pay | Admitting: Physical Medicine & Rehabilitation

## 2014-02-08 ENCOUNTER — Encounter: Payer: Medicare HMO | Attending: Physical Medicine & Rehabilitation

## 2014-02-08 ENCOUNTER — Ambulatory Visit (HOSPITAL_BASED_OUTPATIENT_CLINIC_OR_DEPARTMENT_OTHER): Payer: Medicare HMO | Admitting: Physical Medicine & Rehabilitation

## 2014-02-08 VITALS — BP 152/96 | HR 75 | Resp 14 | Ht 66.0 in | Wt 232.0 lb

## 2014-02-08 DIAGNOSIS — G43909 Migraine, unspecified, not intractable, without status migrainosus: Secondary | ICD-10-CM | POA: Insufficient documentation

## 2014-02-08 DIAGNOSIS — E785 Hyperlipidemia, unspecified: Secondary | ICD-10-CM | POA: Insufficient documentation

## 2014-02-08 DIAGNOSIS — IMO0002 Reserved for concepts with insufficient information to code with codable children: Secondary | ICD-10-CM | POA: Insufficient documentation

## 2014-02-08 DIAGNOSIS — M545 Low back pain, unspecified: Secondary | ICD-10-CM | POA: Insufficient documentation

## 2014-02-08 DIAGNOSIS — Z8673 Personal history of transient ischemic attack (TIA), and cerebral infarction without residual deficits: Secondary | ICD-10-CM | POA: Insufficient documentation

## 2014-02-08 DIAGNOSIS — M47817 Spondylosis without myelopathy or radiculopathy, lumbosacral region: Secondary | ICD-10-CM

## 2014-02-08 DIAGNOSIS — I10 Essential (primary) hypertension: Secondary | ICD-10-CM | POA: Insufficient documentation

## 2014-02-08 DIAGNOSIS — G8929 Other chronic pain: Secondary | ICD-10-CM | POA: Insufficient documentation

## 2014-02-08 DIAGNOSIS — M961 Postlaminectomy syndrome, not elsewhere classified: Secondary | ICD-10-CM | POA: Insufficient documentation

## 2014-02-08 DIAGNOSIS — J45909 Unspecified asthma, uncomplicated: Secondary | ICD-10-CM | POA: Insufficient documentation

## 2014-02-08 NOTE — Patient Instructions (Signed)
recommend no free weights with the exception of bench press. Curling can be done in a seated position  Lower extremity squats without weights can be performed

## 2014-02-08 NOTE — Progress Notes (Signed)
  PROCEDURE RECORD [facility]   Name: Gary Clark DOB:December 31, 1970 MRN: [id number]  Date:02/08/2014  Physician: Claudette Laws, MD    Nurse/CMA: Kyrsten Deleeuw CMA  Allergies: No Known Allergies  Consent Signed: yes  Is patient diabetic? no  CBG today?   Pregnant: no LMP: No LMP for male patient. (age 44-55)  Anticoagulants: no Anti-inflammatory: no Antibiotics: no  Procedure: Bilateral L2-3 Medial Branch Block Position: Prone Start Time: 12:27 End Time: 12:36 Fluoro Time: 29 seconds  RN/CMA Anne Boltz CMA Khalila Buechner CMA    Time 12:10 12:38    BP 152/96 162/97    Pulse 75 80    Respirations 14 14    O2 Sat 98 100    S/S 6 6    Pain Level 6/10 4/10     D/C home with niece Gary Clark), patient A & O X 3, D/C instructions reviewed, and sits independently.

## 2014-02-08 NOTE — Progress Notes (Signed)
Bilateral Lumbar L1, L2  medial branch blocks under fluoroscopic guidance  Indication: Lumbar pain which is not relieved by medication management or other conservative care and interfering with self-care and mobility.  Informed consent was obtained after describing risks and benefits of the procedure with the patient, this includes bleeding, infection, paralysis and medication side effects.  The patient wishes to proceed and has given written consent.  The patient was placed in prone position.  The lumbar area was marked and prepped with Betadine.  One mL of 1% lidocaine was injected into each of 6 areas into the skin and subcutaneous tissue.  the left L3 superior articular process in transverse process junction was targeted.  Bone contact was made.  Omnipaque 180 was injected x 0.5 mL demonstrating no intravascular uptake. Then a solution containing one mL of 4 mg per mL dexamethasone and 3 mL of 2% MPF lidocaine was injected x 0.5 mL.  Then the left L2 superior articular process in transverse process junction was targeted.  Bone contact was made.  Omnipaque 180 was injected x 0.5 mL demonstrating no intravascular uptake.  Then a solution containing one mL of 4 mg per mL dexamethasone and 3 mL if 2% MPF lidocaine was injected x 0.5 mL.  This same procedure was performed on the right side using the same needle, technique and injectate.  Patient tolerated procedure well.  Post procedure instructions were given.

## 2014-02-12 ENCOUNTER — Telehealth: Payer: Self-pay | Admitting: Family Medicine

## 2014-02-12 DIAGNOSIS — I10 Essential (primary) hypertension: Secondary | ICD-10-CM

## 2014-02-12 MED ORDER — CITALOPRAM HYDROBROMIDE 10 MG PO TABS: 10.0000 mg | ORAL_TABLET | Freq: Every day | ORAL | Status: DC

## 2014-02-12 MED ORDER — HYDROCHLOROTHIAZIDE 25 MG PO TABS: 25.0000 mg | ORAL_TABLET | Freq: Every day | ORAL | Status: DC

## 2014-02-12 NOTE — Telephone Encounter (Signed)
Needs follow up in next 1 month, can refill to appointment only. Thanks.

## 2014-02-12 NOTE — Telephone Encounter (Signed)
Pt is needing new rx hydrochlorothiazide (HYDRODIURIL) 25 MG tablet, and citalopram (celexa) 10 mg, pt states he is completely out and would like for the rx to be sent to [facility] [place].

## 2014-02-12 NOTE — Telephone Encounter (Signed)
Pt is needing new rx for

## 2014-02-27 ENCOUNTER — Ambulatory Visit (INDEPENDENT_AMBULATORY_CARE_PROVIDER_SITE_OTHER): Payer: Commercial Managed Care - HMO | Admitting: Neurology

## 2014-02-27 DIAGNOSIS — R51 Headache: Secondary | ICD-10-CM

## 2014-02-27 DIAGNOSIS — E669 Obesity, unspecified: Secondary | ICD-10-CM

## 2014-02-27 DIAGNOSIS — R0683 Snoring: Secondary | ICD-10-CM

## 2014-02-27 DIAGNOSIS — G4733 Obstructive sleep apnea (adult) (pediatric): Secondary | ICD-10-CM

## 2014-02-27 DIAGNOSIS — R400 Somnolence: Secondary | ICD-10-CM

## 2014-02-27 DIAGNOSIS — R519 Headache, unspecified: Secondary | ICD-10-CM

## 2014-03-08 ENCOUNTER — Encounter: Payer: Self-pay | Admitting: Family Medicine

## 2014-03-08 ENCOUNTER — Other Ambulatory Visit: Payer: Self-pay | Admitting: Family Medicine

## 2014-03-08 ENCOUNTER — Ambulatory Visit (INDEPENDENT_AMBULATORY_CARE_PROVIDER_SITE_OTHER): Payer: Medicare HMO | Admitting: Family Medicine

## 2014-03-08 VITALS — BP 132/94 | HR 78 | Temp 98.6°F | Ht 66.0 in | Wt 238.5 lb

## 2014-03-08 DIAGNOSIS — R7309 Other abnormal glucose: Secondary | ICD-10-CM

## 2014-03-08 DIAGNOSIS — F329 Major depressive disorder, single episode, unspecified: Secondary | ICD-10-CM

## 2014-03-08 DIAGNOSIS — F3289 Other specified depressive episodes: Secondary | ICD-10-CM

## 2014-03-08 DIAGNOSIS — R7303 Prediabetes: Secondary | ICD-10-CM

## 2014-03-08 DIAGNOSIS — I10 Essential (primary) hypertension: Secondary | ICD-10-CM

## 2014-03-08 DIAGNOSIS — M545 Low back pain, unspecified: Secondary | ICD-10-CM

## 2014-03-08 DIAGNOSIS — E785 Hyperlipidemia, unspecified: Secondary | ICD-10-CM

## 2014-03-08 DIAGNOSIS — E781 Pure hyperglyceridemia: Secondary | ICD-10-CM

## 2014-03-08 DIAGNOSIS — F32A Depression, unspecified: Secondary | ICD-10-CM

## 2014-03-08 DIAGNOSIS — G8929 Other chronic pain: Secondary | ICD-10-CM

## 2014-03-08 LAB — BASIC METABOLIC PANEL
BUN: 16 mg/dL (ref 6–23)
CO2: 28 mEq/L (ref 19–32)
Calcium: 9.9 mg/dL (ref 8.4–10.5)
Chloride: 100 mEq/L (ref 96–112)
Creatinine, Ser: 0.9 mg/dL (ref 0.4–1.5)
GFR: 94.96 mL/min (ref >60.00)
Glucose, Bld: 116 mg/dL — ABNORMAL HIGH (ref 70–99)
Potassium: 4.3 mEq/L (ref 3.5–5.1)
Sodium: 137 mEq/L (ref 135–145)

## 2014-03-08 LAB — LIPID PANEL
CHOL/HDL RATIO: 6
Cholesterol: 279 mg/dL — ABNORMAL HIGH (ref 0–200)
HDL: 45.8 mg/dL (ref >39.00)
NonHDL: 233.2
VLDL: 159.6 mg/dL — ABNORMAL HIGH (ref 0.0–40.0)

## 2014-03-08 LAB — HEMOGLOBIN A1C: HEMOGLOBIN A1C: 6.1 % (ref 4.6–6.5)

## 2014-03-08 MED ORDER — CITALOPRAM HYDROBROMIDE 20 MG PO TABS: 20.0000 mg | ORAL_TABLET | Freq: Every day | ORAL | Status: DC

## 2014-03-08 NOTE — Patient Instructions (Signed)
-  increase celexa to 20mg  daily  -We have ordered labs or studies at this visit. It can take up to 1-2 weeks for results and processing. We will contact you with instructions IF your results are abnormal. Normal results will be released to your [facility]. If you have not heard from us or can not find your results in [facility] in 2 weeks please contact our office.

## 2014-03-08 NOTE — Progress Notes (Signed)
No chief complaint on file.   HPI:  1) Chronic back pain:  -followed by PMR (Dr. 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  Past Medical History  Diagnosis Date  . Back pain   . Hypertension   . Asthma   . Depression   . Cluster headache   . Stroke     pt states "mini-Stroke"  . Arthritis   . Blood in stool   . Headache(784.0)     frequent   . Allergy   . Hyperlipidemia   . Migraines   . UTI (urinary tract infection)     Past Surgical History  Procedure Laterality Date  . Back surgery    . Lumbar laminectomy/decompression microdiscectomy  2010  . Anterior and posterior spinal fusion  2011    Family History  Problem Relation Age of Onset  . Heart disease Mother   . Hypertension Mother   . Arthritis Father   . Hypertension Father   . Prostate cancer Father   . Miscarriages / Stillbirths Maternal Grandmother   . Heart disease Maternal Grandfather   . Sudden death Maternal Grandfather   . Breast cancer Sister   . Migraines Sister     History   Social History  . Marital Status: Married    Spouse Name: [name]    Number of Children: 1  . Years of Education: 12th   Occupational History  .      n/a   Social History Main Topics  . Smoking status: Never Smoker   . Smokeless tobacco: Never Used  . Alcohol Use: Yes     Comment: once a month  . Drug Use:  No  . Sexual Activity: Yes    Birth Control/ Protection: None   Other Topics Concern  . None   Social History Narrative   Patient lives at home with his family.   Caffeine Use: 2 cups daily    Current outpatient prescriptions:acetaminophen (TYLENOL) 500 MG tablet, Take 1,500 mg by mouth every 6 (six) hours as needed. For pain, Disp: , Rfl: ;  citalopram (CELEXA) 20 MG tablet, Take 1 tablet (20 mg total) by mouth daily., Disp: 30 tablet, Rfl: 3;  fluticasone (FLONASE) 50 MCG/ACT nasal spray, , Disp: , Rfl: ;  hydrochlorothiazide (HYDRODIURIL) 25 MG tablet, Take 1 tablet (25 mg total) by mouth daily., Disp: 90 tablet, Rfl: 3 ibuprofen (ADVIL,MOTRIN) 200 MG tablet, 200 mg 4 (four) times daily. , Disp: , Rfl: ;  lisinopril (PRINIVIL,ZESTRIL) 10 MG tablet, TAKE 1 TABLET EVERY DAY, Disp: 90 tablet, Rfl: 3;  loratadine (CLARITIN) 10 MG tablet, , Disp: , Rfl: ;  topiramate (TOPAMAX) 50 MG tablet, Take 1 tablet (50 mg total) by mouth 2 (two) times daily., Disp: 60 tablet, Rfl: 12  EXAM:  Filed Vitals:   03/08/14 1016  BP: 132/94  Pulse: 78  Temp: 98.6 F (37 C)    Body mass index is 38.51 kg/(m^2).  GENERAL: vitals reviewed and listed  above, alert, oriented, appears well hydrated and in no acute distress  HEENT: atraumatic, conjunttiva clear, no obvious abnormalities on inspection of external nose and ears  NECK: no obvious masses on inspection  LUNGS: clear to auscultation bilaterally, no wheezes, rales or rhonchi, good air movement  CV: HRRR, no peripheral edema  MS: moves all extremities without noticeable abnormality  PSYCH: pleasant and cooperative, no obvious depression or anxiety  ASSESSMENT AND PLAN:  Discussed the following assessment and plan:  Chronic low back pain - hx spinal stenosis, decompression surgery in 2010, 2011  Depression - Plan: citalopram (CELEXA) 20 MG tablet  Hyperlipemia - Plan: Lipid Panel  Essential hypertension - Plan: Basic metabolic  panel  Prediabetes - Plan: Hemoglobin A1c  -Patient advised to return or notify a doctor immediately if symptoms worsen or persist or new concerns arise.  Patient Instructions  -increase celexa to 20mg  daily  -We have ordered labs or studies at this visit. It can take up to 1-2 weeks for results and processing. We will contact you with instructions IF your results are abnormal. Normal results will be released to your [facility]. If you have not heard from us or can not find your results in [facility] in 2 weeks please contact our office.            Kriste BasqueKIM, HANNAH R.

## 2014-03-08 NOTE — Progress Notes (Signed)
Pre visit review using our clinic review tool, if applicable. No additional management support is needed unless otherwise documented below in the visit note. 

## 2014-03-09 ENCOUNTER — Telehealth: Payer: Self-pay | Admitting: Family Medicine

## 2014-03-09 ENCOUNTER — Telehealth: Payer: Self-pay | Admitting: Neurology

## 2014-03-09 DIAGNOSIS — R519 Headache, unspecified: Secondary | ICD-10-CM

## 2014-03-09 DIAGNOSIS — E669 Obesity, unspecified: Secondary | ICD-10-CM

## 2014-03-09 DIAGNOSIS — R51 Headache: Secondary | ICD-10-CM

## 2014-03-09 DIAGNOSIS — G4733 Obstructive sleep apnea (adult) (pediatric): Secondary | ICD-10-CM

## 2014-03-09 MED ORDER — SIMVASTATIN 20 MG PO TABS: ORAL_TABLET | ORAL | Status: DC

## 2014-03-09 NOTE — Addendum Note (Signed)
Addended by: Terressa KoyanagiKIM, HANNAH R on: 03/09/2014 12:40 PM   Modules accepted: Orders

## 2014-03-09 NOTE — Telephone Encounter (Signed)
Relevant patient education assigned to patient using Emmi. ° °

## 2014-03-09 NOTE — Telephone Encounter (Signed)
Please call and notify patient that the recent sleep study confirmed the diagnosis of OSA. He did well with CPAP during the study with significant improvement of the respiratory events. Therefore, I would like start the patient on CPAP at home. I placed the order in the chart.   Arrange for CPAP set up at home through a DME company of patient's choice and fax/route report to PCP and referring MD (if other than PCP).   The patient will also need a follow up appointment with me in 6-8 weeks post set up that has to be scheduled; help the patient schedule this (in a follow-up slot).   Please re-enforce the importance of compliance with treatment and the need for us to monitor compliance data.   Once you have spoken to the patient and scheduled the return appointment, you may close this encounter, thanks,   Saima Athar, MD, PhD [facility] ([facility])    

## 2014-03-12 ENCOUNTER — Encounter: Payer: Self-pay | Admitting: *Deleted

## 2014-03-12 ENCOUNTER — Telehealth: Payer: Self-pay | Admitting: Family Medicine

## 2014-03-12 NOTE — Telephone Encounter (Signed)
I called and spoke with the patient about his recent sleep study results. I informed the patient that the study confirmed the diagnosis of obstructive sleep apnea and that he did well during the night with significant improvement of the respiratory events. Dr. Frances FurbishAthar recommend starting CPAP therapy at home. I will send the order to [facility] who will contact the patient. I will send a copy of the report to Dr. Marjory LiesPenumalli and Dr. Herold HarmsHanna Kim offices and mail a copy to the patient along with a follow up instruction letter.

## 2014-03-12 NOTE — Telephone Encounter (Signed)
Pt would like to try diet pills to help lose weight a. Pt chole is high [facility]ole.[facility] [address]

## 2014-03-12 NOTE — Telephone Encounter (Signed)
Advise we talk about weight loss meds in person at next visit. In interim advise starting the cholesterol medication on working on diet and exercise.

## 2014-03-12 NOTE — Telephone Encounter (Signed)
I called the pt and informed him of the message below and scheduled an appt for 03/20/14 at 3:15pm.

## 2014-03-13 ENCOUNTER — Telehealth: Payer: Self-pay | Admitting: *Deleted

## 2014-03-13 NOTE — Telephone Encounter (Signed)
One other type of injection is called an epidural steroid injection Next appt can be changed to this if pt would like to try

## 2014-03-13 NOTE — Telephone Encounter (Signed)
Last injection in his back not working well.  He said you asked him to call back with an update of how well it worked. He has a lot of spasms and tingling down his right leg to his toes.  Some in left but not as bad. Has 6 wk follow up appt 03/26/14 with you.

## 2014-03-14 ENCOUNTER — Telehealth: Payer: Self-pay | Admitting: *Deleted

## 2014-03-14 NOTE — Telephone Encounter (Signed)
Left VM for Randa EvensJoanne CMA for Dr Herold HarmsHanna Kim to inform them that Mr Larina BrasStone will need an authorization for his epidural steroid injection by Dr Claudette LawsAndrew Kirsteins through their office because of [facility].  Injection is to be 03/26/14 if approved.

## 2014-03-14 NOTE — Telephone Encounter (Signed)
I spoke with him and he says he will have the injection.  He was a little hesitant because he has had injections before and they were not effective but he is uncertain if he has had a LESI.  He is worried that the tingling in his feet and legs may mean they will "go to sleep".  I advised him to have a driver for the appt and be taking no blood thinners or antibiotic prior to the injection.  He still will have the option to change his mind that day and just have follow up 03/26/14.

## 2014-03-14 NOTE — Telephone Encounter (Signed)
Authorization # [id number] - starts 03/26/2014 - 09/26/2014 silverback human  Authorization done for [employer] Kirsteins,

## 2014-03-14 NOTE — Telephone Encounter (Signed)
Sandi MariscalSylvia Shoemaker called from Dr Bertram GalaKiersteins office [phone]#[phone] requesting [facility] as the pt is scheduled for these on 03/26/14.  Message forwarded to Shelbie Proctoreborah Dawkins referral coordinator.

## 2014-03-20 ENCOUNTER — Encounter: Payer: Self-pay | Admitting: Family Medicine

## 2014-03-20 ENCOUNTER — Telehealth: Payer: Self-pay | Admitting: Family Medicine

## 2014-03-20 ENCOUNTER — Ambulatory Visit (INDEPENDENT_AMBULATORY_CARE_PROVIDER_SITE_OTHER): Payer: Medicare HMO | Admitting: Family Medicine

## 2014-03-20 VITALS — BP 120/84 | HR 91 | Temp 98.7°F | Ht 64.75 in | Wt 229.0 lb

## 2014-03-20 DIAGNOSIS — I10 Essential (primary) hypertension: Secondary | ICD-10-CM

## 2014-03-20 DIAGNOSIS — E669 Obesity, unspecified: Secondary | ICD-10-CM

## 2014-03-20 DIAGNOSIS — R7309 Other abnormal glucose: Secondary | ICD-10-CM

## 2014-03-20 DIAGNOSIS — E785 Hyperlipidemia, unspecified: Secondary | ICD-10-CM

## 2014-03-20 DIAGNOSIS — R7303 Prediabetes: Secondary | ICD-10-CM

## 2014-03-20 MED ORDER — ORLISTAT 120 MG PO CAPS: 120.0000 mg | ORAL_CAPSULE | Freq: Three times a day (TID) | ORAL | Status: DC

## 2014-03-20 NOTE — Telephone Encounter (Signed)
Relevant patient education assigned to patient using Emmi. ° °

## 2014-03-20 NOTE — Progress Notes (Signed)
Pre visit review using our clinic review tool, if applicable. No additional management support is needed unless otherwise documented below in the visit note. 

## 2014-03-20 NOTE — Progress Notes (Signed)
No chief complaint on file.   HPI:  Acute visit for:  1) obesity: -diet: water and coffee (milk and sugar); breakfast - no breakfast - gets up at 6:30; eats lunch around 1 - [employer] - usually chicken; dinner is usually roast, chicken, sausage - rice, pasta - zuchini or squash; snacks/desert: peanuts and popcorn -exercise: no exercise currently -comorbidities: ashma, hld, htn -wonders about weight loss medications  ROS: See pertinent positives and negatives per HPI.  Past Medical History  Diagnosis Date  . Back pain   . Hypertension   . Asthma   . Depression   . Cluster headache   . Stroke     pt states "mini-Stroke"  . Arthritis   . Blood in stool   . Headache(784.0)     frequent   . Allergy   . Hyperlipidemia   . Migraines   . UTI (urinary tract infection)     Past Surgical History  Procedure Laterality Date  . Back surgery    . Lumbar laminectomy/decompression microdiscectomy  2010  . Anterior and posterior spinal fusion  2011    Family History  Problem Relation Age of Onset  . Heart disease Mother   . Hypertension Mother   . Arthritis Father   . Hypertension Father   . Prostate cancer Father   . Miscarriages / Stillbirths Maternal Grandmother   . Heart disease Maternal Grandfather   . Sudden death Maternal Grandfather   . Breast cancer Sister   . Migraines Sister     History   Social History  . Marital Status: Married    Spouse Name: [name]    Number of Children: 1  . Years of Education: 12th   Occupational History  .      n/a   Social History Main Topics  . Smoking status: Never Smoker   . Smokeless tobacco: Never Used  . Alcohol Use: Yes     Comment: once a month  . Drug Use: No  . Sexual Activity: Yes    Birth Control/ Protection: None   Other Topics Concern  . None   Social History Narrative   Patient lives at home with his family.   Caffeine Use: 2 cups daily    Current outpatient prescriptions:acetaminophen (TYLENOL) 500  MG tablet, Take 1,500 mg by mouth every 6 (six) hours as needed. For pain, Disp: , Rfl: ;  citalopram (CELEXA) 20 MG tablet, Take 1 tablet (20 mg total) by mouth daily., Disp: 30 tablet, Rfl: 3;  fluticasone (FLONASE) 50 MCG/ACT nasal spray, , Disp: , Rfl: ;  hydrochlorothiazide (HYDRODIURIL) 25 MG tablet, Take 1 tablet (25 mg total) by mouth daily., Disp: 90 tablet, Rfl: 3 ibuprofen (ADVIL,MOTRIN) 200 MG tablet, 200 mg 4 (four) times daily. , Disp: , Rfl: ;  lisinopril (PRINIVIL,ZESTRIL) 10 MG tablet, TAKE 1 TABLET EVERY DAY, Disp: 90 tablet, Rfl: 3;  loratadine (CLARITIN) 10 MG tablet, , Disp: , Rfl: ;  simvastatin (ZOCOR) 20 MG tablet, 20 mg daily for 2 weeks, then 40 mg (2 tabs) daily, Disp: 105 tablet, Rfl: 0 topiramate (TOPAMAX) 50 MG tablet, Take 1 tablet (50 mg total) by mouth 2 (two) times daily., Disp: 60 tablet, Rfl: 12;  orlistat (XENICAL) 120 MG capsule, Take 1 capsule (120 mg total) by mouth 3 (three) times daily with meals., Disp: 90 capsule, Rfl: 3  EXAM:  Filed Vitals:   03/20/14 1510  BP: 120/84  Pulse: 91  Temp: 98.7 F (37.1 C)    Body mass  index is 38.39 kg/(m^2).  GENERAL: vitals reviewed and listed above, alert, oriented, appears well hydrated and in no acute distress  HEENT: atraumatic, conjunttiva clear, no obvious abnormalities on inspection of external nose and ears  NECK: no obvious masses on inspection  LUNGS: clear to auscultation bilaterally, no wheezes, rales or rhonchi, good air movement  CV: HRRR, no peripheral edema  MS: moves all extremities without noticeable abnormality  PSYCH: pleasant and cooperative, no obvious depression or anxiety  ASSESSMENT AND PLAN:  Discussed the following assessment and plan:  Obesity, unspecified - Plan: orlistat (XENICAL) 120 MG capsule, Amb ref to Medical Nutrition Therapy-MNT  Hyperlipemia - Plan: Amb ref to Medical Nutrition Therapy-MNT  Prediabetes - Plan: Amb ref to Medical Nutrition Therapy-MNT  Essential  hypertension - Plan: Amb ref to Medical Nutrition Therapy-MNT  Treatment Options for Obesity per below discussed. Advised comprehensive exercise and diet plan and offered referral [facility]. [facility]ified of [facility]. Discussed orlistat likely only medication option given his other medical issues/risks.   A comprehensive exercise and diet program are advised and are the initial and longterm/lifelong treatment for obesity.  This is a long and initially often difficult process and it will often take years to get to reach optimal health  As you get healthier you will build extra blood volume and lean tissue that is heavier then fatty tissue and you will hit plateaus in your weight - thus I ADVISE YOU DO NOT FOLLOW YOUR WEIGHT ON A SCALE   The safest and healthiest way to improve you physical and mental health is to: - eat a healthy diet consisting of lots of vegetables, fruits, beans, nuts, seeds, healthy meats such as white chicken and fish and whole grains.  - avoid fried foods, fast food, processed foods, sodas, red meet and other fattening foods.  - get a least 150-300 minutes of aerobic exercise per week.  -reduce stress - counseling, meditation, relaxation to balance other aspects of your life   Medications for Obesity: IF BMI >30 and you have not achieved weight loss through diet and exercise alone, we suggest pharmacologic therapy can be added to diet and exercise.  First Choice:  1)Orlistat (Xenical): -likely the safest in terms of heart and vascular side effects and helps cholesterol - usually recommended for 2 - 4 years -common and or serious side effects include but are not limited to: anaphylaxis, liver toxicity, gas, oily stools, loose stools, fatty stools -150 mg up to 3 times daily only during meals containing fat, diet should be <30% fat -not recommended in: pregnancy, malabsorption, some kidney stones, anorexia or bulemia  history  2)Lorcaserin (Belviq) -no long term safety data -to be discontinued if do not achieve 5% body weight reduction in 12 weeks -common and or serious side effects include but are not limited to: heart disease, anemia, abuse and dependency, depression, suicide, nausea and vomiting, headaches, low blood sugar, dizziness, anemia, fatigue, diarrhea, urinary tract infections, back pain, dry mouth, pain, cognitive impairment 10mg  twice daily -not recommended if pregnant, kidney disease, heart disease, diabetes, depression -I do not advise long term and advise weaning of if not effective and after 1-2 years  3)combination phentermine-topiramate (Qsymia)- for men or post menopausal women or monthly pregnancy test: -can cause fetal anomalies -to be discontinued GRADUALLY if do not achieve 5% body weight reduction in 12 weeks -adverse: -not recommended if: heart disease, breastfeeding, pregnant, hyperthyroid, glaucoma, drug abuse history, renal or liver problems, alcohol  use, depression -common and or serious side effects include but are not limited to: metabolic acidosis, kidney stones, bone issues, electrolyte issues, heart attack, suicide, psychosis, dependency and abuse, severe reaction, seizure is stop suddenly, dry mouth, numbness, headache, blurred vision, diarrhea, fatigue, depression, anxiety, pain, poor focus, acid reflux, dry eyes, eye pain, heart arhythmia -I do not advise long term and advise weaning of if not effective and in 1-2 years  If diabetes:  1) Metformin, orlistat or  2)Liraglutide (Victoza) -can cause t cell tumors, anaphylaxis, kidney toxicity, pancreatitis, nausea, vomiting, diarrhea, headache -injected:0.6mg  daily for 1 week, then 1.2mg  daily  Surgery is also an option and [place] has many useful resources if you are considering this.  Please check out this website if you wish:  [email or web address]  -Patient advised to return or  notify a doctor immediately if symptoms worsen or persist or new concerns arise.  Patient Instructions  Treatment Options for Obesity per below discussed. Advised comprehensive exercise and diet plan and offered referral to [facility]. Call to set up your appointment with the health coach per card we gave you.  We placed a referral for you as discussed to help with a less the 30% fat diet that is well rounded and balanced. It usually takes about 1-2 weeks to process and schedule this referral. If you have not heard from [place] regarding this appointment in 2 weeks please contact our office.   Discussed orlistat likely only medication option given other medical issues/risks and potentially diabetes medications.   A comprehensive exercise and diet program are advised and are the initial and longterm/lifelong treatment for obesity.  This is a long and initially often difficult process and it will often take years to get to reach optimal health  As you get healthier you will build extra blood volume and lean tissue that is heavier then fatty tissue and you will hit plateaus in your weight - thus I ADVISE YOU DO NOT FOLLOW YOUR WEIGHT ON A SCALE   The safest and healthiest way to improve you physical and mental health is to: - eat a healthy diet consisting of lots of vegetables, fruits, beans, nuts, seeds, healthy meats such as white chicken and fish and whole grains.  - avoid fried foods, fast food, processed foods, sodas, red meet and other fattening foods.  - get a least 150-300 minutes of aerobic exercise per week.  -reduce stress - counseling, meditation, relaxation to balance other aspects of your life   Medications for Obesity: IF BMI >30 and you have not achieved weight loss through diet and exercise alone, we suggest pharmacologic therapy can be added to diet and exercise.  First Choice:  1)Orlistat (Xenical): -likely the safest in terms of heart and vascular side effects  and helps cholesterol - usually recommended for 2 - 4 years -common and or serious side effects include but are not limited to: anaphylaxis, liver toxicity, gas, oily stools, loose stools, fatty stools -120 mg up to 3 times daily only during meals containing fat, diet should be <30% fat -not recommended in: pregnancy, malabsorption, some kidney stones, anorexia or bulemia history  If diabetes:  1) Metformin, orlistat or  2)Liraglutide (Victoza) -can cause t cell tumors, anaphylaxis, kidney toxicity, pancreatitis, nausea, vomiting, diarrhea, headache -injected:0.6mg  daily for 1 week, then 1.2mg  daily  Surgery is also an option and [place] has many useful resources if you are considering this.  Please check out this website if you wish:  [email or web address]  -  Patient advised to return or notify a doctor immediately if symptoms worsen or persist or new concerns arise.     Kriste BasqueKIM, HANNAH R.

## 2014-03-20 NOTE — Patient Instructions (Signed)
Treatment Options for Obesity per below discussed. Advised comprehensive exercise and diet plan and offered referral to [facility]. Call to set up your appointment with the health coach per card we gave you.  We placed a referral for you as discussed to help with a less the 30% fat diet that is well rounded and balanced. It usually takes about 1-2 weeks to process and schedule this referral. If you have not heard from us regarding this appointment in 2 weeks please contact our office.   Discussed orlistat likely only medication option given other medical issues/risks and potentially diabetes medications.   A comprehensive exercise and diet program are advised and are the initial and longterm/lifelong treatment for obesity.  This is a long and initially often difficult process and it will often take years to get to reach optimal health  As you get healthier you will build extra blood volume and lean tissue that is heavier then fatty tissue and you will hit plateaus in your weight - thus I ADVISE YOU DO NOT FOLLOW YOUR WEIGHT ON A SCALE   The safest and healthiest way to improve you physical and mental health is to: - eat a healthy diet consisting of lots of vegetables, fruits, beans, nuts, seeds, healthy meats such as white chicken and fish and whole grains.  - avoid fried foods, fast food, processed foods, sodas, red meet and other fattening foods.  - get a least 150-300 minutes of aerobic exercise per week.  -reduce stress - counseling, meditation, relaxation to balance other aspects of your life   Medications for Obesity: IF BMI >30 and you have not achieved weight loss through diet and exercise alone, we suggest pharmacologic therapy can be added to diet and exercise.  First Choice:  1)Orlistat (Xenical): -likely the safest in terms of heart and vascular side effects and helps cholesterol - usually recommended for 2 - 4 years -common and or serious side effects include but  are not limited to: anaphylaxis, liver toxicity, gas, oily stools, loose stools, fatty stools -120 mg up to 3 times daily only during meals containing fat, diet should be <30% fat -not recommended in: pregnancy, malabsorption, some kidney stones, anorexia or bulemia history  If diabetes:  1) Metformin, orlistat or  2)Liraglutide (Victoza) -can cause t cell tumors, anaphylaxis, kidney toxicity, pancreatitis, nausea, vomiting, diarrhea, headache -injected:0.6mg  daily for 1 week, then 1.2mg  daily  Surgery is also an option and [place] has many useful resources if you are considering this.  Please check out this website if you wish:  [email or web address]  -Patient advised to return or notify a doctor immediately if symptoms worsen or persist or new concerns arise.

## 2014-03-26 ENCOUNTER — Encounter: Payer: Self-pay | Admitting: Physical Medicine & Rehabilitation

## 2014-03-26 ENCOUNTER — Encounter: Payer: Medicare HMO | Attending: Physical Medicine & Rehabilitation

## 2014-03-26 ENCOUNTER — Ambulatory Visit (HOSPITAL_BASED_OUTPATIENT_CLINIC_OR_DEPARTMENT_OTHER): Payer: Medicare HMO | Admitting: Physical Medicine & Rehabilitation

## 2014-03-26 VITALS — BP 125/75 | HR 83 | Resp 14 | Ht 64.75 in | Wt 229.0 lb

## 2014-03-26 DIAGNOSIS — G8929 Other chronic pain: Secondary | ICD-10-CM | POA: Insufficient documentation

## 2014-03-26 DIAGNOSIS — I10 Essential (primary) hypertension: Secondary | ICD-10-CM | POA: Insufficient documentation

## 2014-03-26 DIAGNOSIS — G43909 Migraine, unspecified, not intractable, without status migrainosus: Secondary | ICD-10-CM | POA: Insufficient documentation

## 2014-03-26 DIAGNOSIS — M545 Low back pain, unspecified: Secondary | ICD-10-CM | POA: Diagnosis present

## 2014-03-26 DIAGNOSIS — E785 Hyperlipidemia, unspecified: Secondary | ICD-10-CM | POA: Diagnosis not present

## 2014-03-26 DIAGNOSIS — M961 Postlaminectomy syndrome, not elsewhere classified: Secondary | ICD-10-CM | POA: Insufficient documentation

## 2014-03-26 DIAGNOSIS — IMO0002 Reserved for concepts with insufficient information to code with codable children: Secondary | ICD-10-CM | POA: Diagnosis not present

## 2014-03-26 DIAGNOSIS — J45909 Unspecified asthma, uncomplicated: Secondary | ICD-10-CM | POA: Diagnosis not present

## 2014-03-26 DIAGNOSIS — Z8673 Personal history of transient ischemic attack (TIA), and cerebral infarction without residual deficits: Secondary | ICD-10-CM | POA: Diagnosis not present

## 2014-03-26 DIAGNOSIS — M5416 Radiculopathy, lumbar region: Secondary | ICD-10-CM

## 2014-03-26 DIAGNOSIS — M47817 Spondylosis without myelopathy or radiculopathy, lumbosacral region: Secondary | ICD-10-CM | POA: Diagnosis not present

## 2014-03-26 NOTE — Progress Notes (Signed)
Lumbar Left L5 transforaminal epidural steroid injection under fluoroscopic guidance  Indication: Lumbosacral radiculitis is not relieved by medication management or other conservative care and interfering with self-care and mobility.   Informed consent was obtained after describing risk and benefits of the procedure with the patient, this includes bleeding, bruising, infection, paralysis and medication side effects.  The patient wishes to proceed and has given written consent.  Patient was placed in prone position.  The lumbar area was marked and prepped with Betadine.  It was entered with a 25-gauge 1-1/2 inch needle and one mL of 1% lidocaine was injected into the skin and subcutaneous tissue.  Then a 22-gauge 5inch spinal needle was inserted into the L5-S1 intervertebral foramen under AP, lateral, and oblique view.  Then a solution containing one mL of 10 mg per mL dexamethasone and 2 mL of 1% lidocaine was injected.  The patient tolerated procedure well.  Post procedure instructions were given.  Please see post procedure form.

## 2014-03-26 NOTE — Patient Instructions (Signed)

## 2014-03-26 NOTE — Progress Notes (Signed)
  PROCEDURE RECORD [facility]   Name: Gary Clark DOB:03-Jan-1971 MRN: [id number]  Date:03/26/2014  Physician: Claudette LawsAndrew Kirsteins, MD    Nurse/CMA: Kelli ChurnLevens, CMA/Shumaker, RN  Allergies: No Known Allergies  Consent Signed: Yes.    Is patient diabetic? No.    Pregnant: No. LMP: No LMP for male patient. (age 44-55)  Anticoagulants: no Anti-inflammatory: no Antibiotics: no  Procedure: Lumbar epidual steroid injection  Position: Prone Start Time:   End Time:   Fluoro Time:   RN/CMA Braxton Vantrease,CMA Shumaker, RN    Time 1046     BP 125/75     Pulse 83     Respirations 14     O2 Sat 97     S/S 6     Pain Level 6/10      D/C home with Wife, patient A & O X 3, D/C instructions reviewed, and sits independently.

## 2014-03-26 NOTE — Progress Notes (Signed)
Encounter closed before injection information loaded into procedure note.   Start time 10:56  End time 11:02  Fluoro time 23 seconds.  Vital signs 128/82 p 85 r 14  O2sat 98%  Stewart scale 6.  Pain is about the same as pre injection

## 2014-04-09 ENCOUNTER — Telehealth: Payer: Self-pay

## 2014-04-09 DIAGNOSIS — M545 Low back pain, unspecified: Secondary | ICD-10-CM

## 2014-04-09 DIAGNOSIS — R200 Anesthesia of skin: Secondary | ICD-10-CM

## 2014-04-09 DIAGNOSIS — G8929 Other chronic pain: Secondary | ICD-10-CM

## 2014-04-09 NOTE — Telephone Encounter (Signed)
[facility] has not helped at all. He said the numbness and pain in his back and legs are actually worse. Please advise.

## 2014-04-10 NOTE — Telephone Encounter (Signed)
Lumbar MRI ordered.  Patient aware.

## 2014-04-10 NOTE — Telephone Encounter (Signed)
Please order lumbar MRI with contrast

## 2014-04-12 ENCOUNTER — Encounter: Payer: Self-pay | Admitting: Dietician

## 2014-04-12 ENCOUNTER — Encounter: Payer: Medicare HMO | Attending: Family Medicine | Admitting: Dietician

## 2014-04-12 VITALS — Ht 65.5 in | Wt 224.0 lb

## 2014-04-12 DIAGNOSIS — R7303 Prediabetes: Secondary | ICD-10-CM

## 2014-04-12 DIAGNOSIS — Z713 Dietary counseling and surveillance: Secondary | ICD-10-CM | POA: Diagnosis present

## 2014-04-12 DIAGNOSIS — E669 Obesity, unspecified: Secondary | ICD-10-CM

## 2014-04-12 DIAGNOSIS — R7309 Other abnormal glucose: Secondary | ICD-10-CM | POA: Diagnosis not present

## 2014-04-12 NOTE — Progress Notes (Signed)
  Medical Nutrition Therapy:  Appt start time: 1330 end time:  1430.   Assessment:  Primary concerns today: Gary Clark was referred here today for prediabetes. His HgA1c was 6.1% last month (03/08/14).  He recently has lost some weight, 14 lbs since 03/08/14 (1 month ago), and is motivated to keep going to better care for his son who has cerebral palsy and to keep his blood pressure ,cholesterol, and blood sugar in better control.  His goal weight is 180 lbs.  Patient lives with his wife, son, and niece.  Wife and niece do most of the food shopping and cooking.  He is currently on disability and is not working.  He stays busy around the house and does yard work and takes care of his son when he is not in school.  He is normally a Arboriculturist"snacker" but reports that his appetite has decreased since the end of May.  Part of this he states was with his responsibility to watch his son now that he is out of school.  Patient reports not having much of a sweet tooth and states he no longer drinks regular soda.  Preferred Learning Style:  No preference indicated    Learning Readiness:  Ready  MEDICATIONS: see list   DIETARY INTAKE:  24-hr recall:  B ( AM): skips most days  Snk ( AM): none  L ( PM): skips most days Snk ( PM): maybe some sunchips D ( PM): chicken, chicken enchiladas, roast, hamburger steak, chicken caesar salad, grill skirt steaks, italian sausage with peppers and onions, chicken alfredo Snk ( PM): popcorn, (usually peanuts but not recently) Beverages: around 80-100 oz. Water, sometimes flavored, coffee   Usual physical activity: walking 4 days a week for about a mile at a time.  Some light strength training be careful of his back.  Progress Towards Goal(s):  In progress.   Nutritional Diagnosis:  [place]-3.3 Overweight/obesity As related to limited physical activity and mindless eating habits.  As evidenced by dietary recall and BMI of 36.7.    Intervention:  Nutrition education for prediabetes,  role of glucose and energy and insulin within the body, which foods contain carbohydrates, appropriate portion sizes of carbohydrates, and importance of other lifestyle factors such as exercise.  Utilized the MyPlate portion method for building healthy, balanced meals. Discussed role of protein, fat, and carbohydrate and how each effects heart health and blood sugar control.  Encouraged choosing exercise such as water activities and chair aerobics that will be appropriate for his reported back and leg pain.  Encouraged choosing more smaller meals throughout his day and discouraged skipping of meals.  Instructed patient to listen to hunger and fullness cues and discouraged mindless eating and snacking such as cleaning ones plate.    Teaching Method Utilized: Visual Auditory  Barriers to learning/adherence to lifestyle change: none  Demonstrated degree of understanding via:  Teach Back   Monitoring/Evaluation:  Dietary intake, exercise, portions, meal pattern, and body weight prn.

## 2014-04-24 ENCOUNTER — Telehealth: Payer: Self-pay | Admitting: Family Medicine

## 2014-04-24 NOTE — Telephone Encounter (Signed)
Dr Liana GeroldFry-after reviewing the pts med list, he needs an Rx for Simvastatin 40mg  as the 20mg  Rx was given 03/08/2014 to take 20mg  initially then increase to 40mg . Could you approve this?

## 2014-04-24 NOTE — Telephone Encounter (Signed)
Pt said Dr Selena BattenKim up his rx to 40 mg and when he had it refill they gave him 20 mg so he was taking 2. Pt now can not get another refill because it it not time since he was taking 2. Pt would like a new rx for 40 mg    Pharmacy; [facility]

## 2014-04-24 NOTE — Telephone Encounter (Signed)
Wait to ask Dr. Selena BattenKim

## 2014-04-25 ENCOUNTER — Other Ambulatory Visit: Payer: Self-pay | Admitting: *Deleted

## 2014-04-25 ENCOUNTER — Ambulatory Visit
Admission: RE | Admit: 2014-04-25 | Discharge: 2014-04-25 | Disposition: A | Discharge status: Home / Self Care | Payer: Commercial Managed Care - HMO | Source: Ambulatory Visit | Attending: Physical Medicine & Rehabilitation | Admitting: Physical Medicine & Rehabilitation

## 2014-04-25 DIAGNOSIS — M545 Low back pain, unspecified: Secondary | ICD-10-CM

## 2014-04-25 DIAGNOSIS — R200 Anesthesia of skin: Secondary | ICD-10-CM

## 2014-04-25 DIAGNOSIS — G8929 Other chronic pain: Secondary | ICD-10-CM

## 2014-04-25 MED ORDER — SIMVASTATIN 40 MG PO TABS: 40.0000 mg | ORAL_TABLET | Freq: Every day | ORAL | Status: DC

## 2014-04-25 MED ORDER — GADOBENATE DIMEGLUMINE 529 MG/ML IV SOLN
20.0000 mL | Freq: Once | INTRAVENOUS | Status: AC | PRN
  Administered 2014-04-25: 20 mL via INTRAVENOUS

## 2014-04-25 NOTE — Telephone Encounter (Signed)
Ok to do 40mg  - #90, 3 RF.

## 2014-04-25 NOTE — Telephone Encounter (Signed)
I called the pt and informed him the Rx was sent to his pharmacy.   

## 2014-04-26 ENCOUNTER — Telehealth: Payer: Self-pay | Admitting: Family Medicine

## 2014-04-26 MED ORDER — CITALOPRAM HYDROBROMIDE 40 MG PO TABS: 40.0000 mg | ORAL_TABLET | Freq: Every day | ORAL | Status: DC

## 2014-04-26 NOTE — Telephone Encounter (Signed)
Call in Celexa 40 mg daily, #30 with no rf

## 2014-04-26 NOTE — Telephone Encounter (Signed)
Rx done and I called the pt and informed him of this.  Message forwarded to Dr Selena BattenKim for review.

## 2014-04-26 NOTE — Telephone Encounter (Signed)
Dr Russ HaloFry-I called the pt and advised him I [facility] and [facility] stated the pt has 1 refill left already and cannot pick this up until the 17th.  I informed the patient and he states he recalls Dr Selena BattenKim telling him at his last visit to increase the Celexa to 40mg  and he has been taking this and this is why he needs a refill and he cannot wait until Dr Selena BattenKim comes back on Monday. Could you refill this at taking 40mg  a day?

## 2014-04-26 NOTE — Telephone Encounter (Signed)
Pt request refill of the following: citalopram (CELEXA) 20 MG tablet    Phamacy:  [facility]

## 2014-04-27 MED ORDER — CITALOPRAM HYDROBROMIDE 40 MG PO TABS: 40.0000 mg | ORAL_TABLET | Freq: Every day | ORAL | Status: DC

## 2014-04-27 NOTE — Telephone Encounter (Signed)
Rx done. 

## 2014-04-27 NOTE — Telephone Encounter (Signed)
I thought I already sent message eon this. Please give 3 months refill. Thanks.

## 2014-05-03 ENCOUNTER — Telehealth: Payer: Self-pay

## 2014-05-03 NOTE — Telephone Encounter (Signed)
No major changes compared to 2013 Slightly increased disc degeneration at L3-L4 which is above the level of the fusion. No evidence of pinched nerves at any level

## 2014-05-03 NOTE — Telephone Encounter (Signed)
Contacted patient to inform him of the message below.

## 2014-05-03 NOTE — Telephone Encounter (Signed)
Patient is requesting results from MRI on 7/28. Please advise.

## 2014-05-08 ENCOUNTER — Telehealth: Payer: Self-pay | Admitting: *Deleted

## 2014-05-08 NOTE — Telephone Encounter (Signed)
Calling to cancel appt 05/11/14.  Has a conflict in schedule.  Message sent to front office.

## 2014-05-09 NOTE — Telephone Encounter (Signed)
Please cancel his appt for  8/28

## 2014-05-11 ENCOUNTER — Ambulatory Visit: Payer: Medicare HMO | Admitting: Physical Medicine & Rehabilitation

## 2014-07-08 ENCOUNTER — Encounter (HOSPITAL_COMMUNITY): Payer: Self-pay | Admitting: Emergency Medicine

## 2014-07-08 ENCOUNTER — Emergency Department (HOSPITAL_COMMUNITY)
Admission: EM | Admit: 2014-07-08 | Discharge: 2014-07-08 | Disposition: A | Discharge status: Home / Self Care | Payer: Medicare HMO | Attending: Emergency Medicine | Admitting: Emergency Medicine

## 2014-07-08 DIAGNOSIS — Z87440 Personal history of urinary (tract) infections: Secondary | ICD-10-CM | POA: Insufficient documentation

## 2014-07-08 DIAGNOSIS — E785 Hyperlipidemia, unspecified: Secondary | ICD-10-CM | POA: Insufficient documentation

## 2014-07-08 DIAGNOSIS — Z79899 Other long term (current) drug therapy: Secondary | ICD-10-CM | POA: Insufficient documentation

## 2014-07-08 DIAGNOSIS — J45909 Unspecified asthma, uncomplicated: Secondary | ICD-10-CM | POA: Diagnosis not present

## 2014-07-08 DIAGNOSIS — M1990 Unspecified osteoarthritis, unspecified site: Secondary | ICD-10-CM | POA: Insufficient documentation

## 2014-07-08 DIAGNOSIS — Z8673 Personal history of transient ischemic attack (TIA), and cerebral infarction without residual deficits: Secondary | ICD-10-CM | POA: Insufficient documentation

## 2014-07-08 DIAGNOSIS — G43909 Migraine, unspecified, not intractable, without status migrainosus: Secondary | ICD-10-CM | POA: Diagnosis not present

## 2014-07-08 DIAGNOSIS — M79605 Pain in left leg: Secondary | ICD-10-CM | POA: Diagnosis present

## 2014-07-08 DIAGNOSIS — I10 Essential (primary) hypertension: Secondary | ICD-10-CM | POA: Insufficient documentation

## 2014-07-08 DIAGNOSIS — F329 Major depressive disorder, single episode, unspecified: Secondary | ICD-10-CM | POA: Diagnosis not present

## 2014-07-08 DIAGNOSIS — M5432 Sciatica, left side: Secondary | ICD-10-CM | POA: Diagnosis not present

## 2014-07-08 DIAGNOSIS — G8929 Other chronic pain: Secondary | ICD-10-CM | POA: Diagnosis not present

## 2014-07-08 DIAGNOSIS — Z7951 Long term (current) use of inhaled steroids: Secondary | ICD-10-CM | POA: Insufficient documentation

## 2014-07-08 MED ORDER — IBUPROFEN 400 MG PO TABS
600.0000 mg | ORAL_TABLET | Freq: Once | ORAL | Status: AC
  Administered 2014-07-08: 600 mg via ORAL
  Filled 2014-07-08 (×2): qty 1

## 2014-07-08 MED ORDER — METHOCARBAMOL 500 MG PO TABS: 500.0000 mg | ORAL_TABLET | Freq: Two times a day (BID) | ORAL | Status: DC

## 2014-07-08 MED ORDER — PREDNISONE 20 MG PO TABS
60.0000 mg | ORAL_TABLET | Freq: Once | ORAL | Status: AC
  Administered 2014-07-08: 60 mg via ORAL
  Filled 2014-07-08: qty 3

## 2014-07-08 MED ORDER — PREDNISONE 20 MG PO TABS: ORAL_TABLET | ORAL | Status: DC

## 2014-07-08 MED ORDER — OXYCODONE-ACETAMINOPHEN 5-325 MG PO TABS
2.0000 | ORAL_TABLET | Freq: Once | ORAL | Status: AC
  Administered 2014-07-08: 2 via ORAL
  Filled 2014-07-08: qty 2

## 2014-07-08 MED ORDER — OXYCODONE-ACETAMINOPHEN 5-325 MG PO TABS: 1.0000 | ORAL_TABLET | Freq: Four times a day (QID) | ORAL | Status: DC | PRN

## 2014-07-08 NOTE — ED Notes (Signed)
Declined W/C at D/C and was escorted to lobby by RN. 

## 2014-07-08 NOTE — ED Provider Notes (Signed)
CSN: [id number]     Arrival date & time 07/08/14  1722 History  This chart was scribed for non-physician practitioner, Junius Finner, PA-C,working with Mirian Mo, MD, by Karle Plumber, ED Scribe. This patient was seen in room TR06C/TR06C and the patient's care was started at 6:28 PM.  Chief Complaint  Patient presents with  . Leg Pain   Patient is a 43 y.o. male presenting with leg pain. The history is provided by the patient. No language interpreter was used.  Leg Pain Associated symptoms: back pain   Associated symptoms: no fever    HPI Comments:  Gary Clark is a 43 y.o. male with PMH of chronic back pain, HTN, hyperlipidemia, and depression who presents to the Emergency Department complaining of severe sharp left lower back pain that radiates through his left hip down to his foot that began this morning. He reports some increased activity yesterday of walking around the mall. He had no pain until waking this morning. Pt has taken Tylenol and Motrin for the pain with minimal relief. He denies left knee pain. He denies numbness, tingling, or weakness of the left leg, bowel or bladder incontinence, fever or chills. He reports past surgical history of two back surgeries with the last one three years ago. The first was a lumbar decompression by Dr. Jillyn Hidden and the last was a lumbar fusion but pt cannot remember the surgeon's name. Pt sees Dr. Adline Mango in [place] for his back pain currently.   Past Medical History  Diagnosis Date  . Back pain   . Hypertension   . Asthma   . Depression   . Cluster headache   . Stroke     pt states "mini-Stroke"  . Arthritis   . Blood in stool   . Headache(784.0)     frequent   . Allergy   . Hyperlipidemia   . Migraines   . UTI (urinary tract infection)    Past Surgical History  Procedure Laterality Date  . Back surgery    . Lumbar laminectomy/decompression microdiscectomy  2010  . Anterior and posterior spinal fusion  2011   Family History   Problem Relation Age of Onset  . Heart disease Mother   . Hypertension Mother   . Arthritis Father   . Hypertension Father   . Prostate cancer Father   . Miscarriages / Stillbirths Maternal Grandmother   . Heart disease Maternal Grandfather   . Sudden death Maternal Grandfather   . Breast cancer Sister   . Migraines Sister    History  Substance Use Topics  . Smoking status: Never Smoker   . Smokeless tobacco: Never Used  . Alcohol Use: Yes     Comment: once a month    Review of Systems  Constitutional: Negative for fever and chills.  Genitourinary:       No bowel or bladder incontinence  Musculoskeletal: Positive for back pain.  Neurological: Negative for weakness and numbness.  All other systems reviewed and are negative.   Allergies  Review of patient's allergies indicates no known allergies.  Home Medications   Prior to Admission medications   Medication Sig Start Date End Date Taking? Authorizing Provider  acetaminophen (TYLENOL) 500 MG tablet Take 1,500 mg by mouth every 4 (four) hours.    Yes Historical Provider, MD  citalopram (CELEXA) 40 MG tablet Take 1 tablet (40 mg total) by mouth daily. 04/27/14  Yes Terressa Koyanagi, DO  fluticasone (FLONASE) 50 MCG/ACT nasal spray Place 1 spray into both  nostrils daily as needed for allergies (congestion).  01/12/14  Yes Historical Provider, MD  hydrochlorothiazide (HYDRODIURIL) 25 MG tablet Take 1 tablet (25 mg total) by mouth daily. 11/22/12  Yes Terressa KoyanagiHannah R Kim, DO  ibuprofen (ADVIL,MOTRIN) 200 MG tablet Take 800 mg by mouth every 6 (six) hours as needed (pain).  10/30/13  Yes Historical Provider, MD  lisinopril (PRINIVIL,ZESTRIL) 10 MG tablet Take 10 mg by mouth daily.   Yes Historical Provider, MD  simvastatin (ZOCOR) 40 MG tablet Take 1 tablet (40 mg total) by mouth at bedtime. 04/25/14  Yes Terressa KoyanagiHannah R Kim, DO  topiramate (TOPAMAX) 50 MG tablet Take 50 mg by mouth 3 (three) times daily as needed (migraines).   Yes Historical  Provider, MD  methocarbamol (ROBAXIN) 500 MG tablet Take 1 tablet (500 mg total) by mouth 2 (two) times daily. 07/08/14   Junius FinnerErin O'Malley, PA-C  oxyCODONE-acetaminophen (PERCOCET/ROXICET) 5-325 MG per tablet Take 1-2 tablets by mouth every 6 (six) hours as needed for moderate pain or severe pain. 07/08/14   Junius FinnerErin O'Malley, PA-C  predniSONE (DELTASONE) 20 MG tablet 3 tabs po day one, then 2 po daily x 4 days 07/08/14   Junius FinnerErin O'Malley, PA-C   Triage Vitals: BP 132/89  Pulse 88  Temp(Src) 98.1 F (36.7 C) (Oral)  Resp 20  Ht 5\' 5"  (1.651 m)  Wt 218 lb (98.884 kg)  BMI 36.28 kg/m2  SpO2 97% Physical Exam  Nursing note and vitals reviewed. Constitutional: He is oriented to person, place, and time. He appears well-developed and well-nourished.  HENT:  Head: Normocephalic and atraumatic.  Eyes: EOM are normal.  Neck: Normal range of motion.  Cardiovascular: Normal rate.   Pulmonary/Chest: Effort normal.  Musculoskeletal: Normal range of motion. He exhibits tenderness. He exhibits no edema.  No midline spinal tenderness. Tenderness along left lumbar musculature, left buttock and left thigh musculature. Full ROM of left thigh and left knee.  Neurological: He is alert and oriented to person, place, and time.  5/5 strength of bilateral lower extremities.  Skin: Skin is warm and dry.  Psychiatric: He has a normal mood and affect. His behavior is normal.    ED Course  Procedures (including critical care time) DIAGNOSTIC STUDIES: Oxygen Saturation is 97% on RA, normal by my interpretation.   COORDINATION OF CARE: 6:39 PM- Will prescribe pain medication and advised pt to follow up with his physician. Pt verbalizes understanding and agrees to plan.  Medications  oxyCODONE-acetaminophen (PERCOCET/ROXICET) 5-325 MG per tablet 2 tablet (2 tablets Oral Given 07/08/14 1853)  ibuprofen (ADVIL,MOTRIN) tablet 600 mg (600 mg Oral Given 07/08/14 1853)  predniSONE (DELTASONE) tablet 60 mg (60 mg Oral  Given 07/08/14 1853)    Labs Review Labs Reviewed - No data to display  Imaging Review No results found.   EKG Interpretation None      MDM   Final diagnoses:  Left sciatic nerve pain    Signs and symptoms significant for sciatic nerve pain.  Rx: robaxin, percocet, and prednisone. Advised pt to f/u with his orthopedist and neurosurgeon.  No red flag symptoms.   Do not believe imaging needed at this time. Not concerned for emergent process taking place. Will tx symptomatically as needed for pain.  Home care instructions provided. Return precautions provided. Pt verbalized understanding and agreement with tx plan.   I personally performed the services described in this documentation, which was scribed in my presence. The recorded information has been reviewed and is accurate.    Junius FinnerErin O'Malley,  PA-C 07/09/14 0144

## 2014-07-08 NOTE — ED Notes (Signed)
C/o L hip pain that radiates down L leg since yesterday.  No known recent injury.  History of back surgery x 2.  Ambulatory to triage.

## 2014-07-09 ENCOUNTER — Ambulatory Visit (HOSPITAL_BASED_OUTPATIENT_CLINIC_OR_DEPARTMENT_OTHER): Payer: Commercial Managed Care - HMO | Admitting: Physical Medicine & Rehabilitation

## 2014-07-09 ENCOUNTER — Encounter: Payer: Commercial Managed Care - HMO | Attending: Physical Medicine & Rehabilitation

## 2014-07-09 VITALS — BP 141/87 | HR 71 | Resp 14 | Ht 65.0 in | Wt 220.0 lb

## 2014-07-09 DIAGNOSIS — M5416 Radiculopathy, lumbar region: Secondary | ICD-10-CM

## 2014-07-09 DIAGNOSIS — M545 Low back pain, unspecified: Secondary | ICD-10-CM

## 2014-07-09 DIAGNOSIS — G8929 Other chronic pain: Secondary | ICD-10-CM

## 2014-07-09 NOTE — Progress Notes (Signed)
Subjective:    Patient ID: Gary Clark, male    DOB: 01-10-1971, 43 y.o.   MRN: [id number]  HPI  Patient went to the emergency department yesterday with increasing left-sided buttocks pain radiating into the left leg. He was placed on prednisone taper as well as received a small amount of oxycodone. He is feeling better today. He denies any pain sleeping on the left side. He is able to walk he is able to dress himself and bathe himself although getting his shoes and socks on his little bit more difficult. Walking tolerance 15 minutes able to climb steps using the drive. He needs help with household duties. Review systems positive for numbness and tingling in the left lateral leg Other past history now on sleep apnea CPAP  Pain Inventory Average Pain 8 Pain Right Now 7 My pain is constant, burning, tingling and aching  In the last 24 hours, has pain interfered with the following? General activity 10 Relation with others 10 Enjoyment of life 10 What TIME of day is your pain at its worst? evening and night  Sleep (in general) Fair  Pain is worse with: walking, sitting and standing Pain improves with: . Relief from Meds: 1  Mobility how many minutes can you walk? 15 ability to climb steps?  yes do you drive?  yes  Function disabled: date disabled .  Neuro/Psych numbness tingling trouble walking depression  Prior Studies Any changes since last visit?  no  Physicians involved in your care Any changes since last visit?  no   Family History  Problem Relation Age of Onset  . Heart disease Mother   . Hypertension Mother   . Arthritis Father   . Hypertension Father   . Prostate cancer Father   . Miscarriages / Stillbirths Maternal Grandmother   . Heart disease Maternal Grandfather   . Sudden death Maternal Grandfather   . Breast cancer Sister   . Migraines Sister    History   Social History  . Marital Status: Married    Spouse Name: [name]    Number of  Children: 1  . Years of Education: 12th   Occupational History  .      n/a   Social History Main Topics  . Smoking status: Never Smoker   . Smokeless tobacco: Never Used  . Alcohol Use: Yes     Comment: once a month  . Drug Use: No  . Sexual Activity: Yes    Birth Control/ Protection: None   Other Topics Concern  . Not on file   Social History Narrative   Patient lives at home with his family.   Caffeine Use: 2 cups daily   Past Surgical History  Procedure Laterality Date  . Back surgery    . Lumbar laminectomy/decompression microdiscectomy  2010  . Anterior and posterior spinal fusion  2011   Past Medical History  Diagnosis Date  . Back pain   . Hypertension   . Asthma   . Depression   . Cluster headache   . Stroke     pt states "mini-Stroke"  . Arthritis   . Blood in stool   . Headache(784.0)     frequent   . Allergy   . Hyperlipidemia   . Migraines   . UTI (urinary tract infection)    BP 141/87  Pulse 71  Resp 14  Ht 5\' 5"  (1.651 m)  Wt 220 lb (99.791 kg)  BMI 36.61 kg/m2  SpO2 96%  Opioid Risk  Score:   Fall Risk Score:     Review of Systems     Objective:   Physical Exam  Nursing note and vitals reviewed. Constitutional: He is oriented to person, place, and time. He appears well-developed and well-nourished.  HENT:  Head: Normocephalic and atraumatic.  Eyes: Pupils are equal, round, and reactive to light.  Neck: Normal range of motion.  Neurological: He is alert and oriented to person, place, and time. He has normal strength.  Reflex Scores:      Patellar reflexes are 1+ on the right side and 2+ on the left side.      Achilles reflexes are 2+ on the right side and 1+ on the left side. 5/5 bilateral quadriceps and hip flexor ankle dorsiflexors Decreased pinprick sensation in the left L4-L5 and S1 dermatomal distribution Lumbar spine has tenderness at the L4 and L5 and S1 paraspinal muscles. No tenderness over the greater trochanter of  the hip.  Psychiatric: He has a normal mood and affect.          Assessment & Plan:  1. Acute on chronic exacerbation of low back pain and left lower extremity radicular symptoms preferable to the L5 and S1 nerve root distribution. He did not get any particular relief from the L5 transforaminal epidural. I suspect his current improvement is because he is now on prednisone. Once he tapers off this will reevaluate. If he has recurrence of left lower extremity pain I would recommend left S1 transforaminal injection.  2. Trochanteric bursitis left side overall improved do not think that this is contributing to his current symptoms  3. Spondylolisthesis L3-L4 do not think that this is contributing to his symptoms. No improvement after medial branch blocks at this level.

## 2014-07-09 NOTE — Patient Instructions (Signed)
If Left leg pain worsens again after prednisone , please call and we will do Left S1 ESI

## 2014-07-10 NOTE — ED Provider Notes (Signed)
Medical screening examination/treatment/procedure(s) were performed by non-physician practitioner and as supervising physician I was immediately available for consultation/collaboration.   EKG Interpretation None        Matthew Gentry, MD 07/10/14 0141 

## 2014-07-18 ENCOUNTER — Telehealth: Payer: Self-pay | Admitting: *Deleted

## 2014-07-18 NOTE — Telephone Encounter (Signed)
I called [facility] at [phone] and left a detailed message per Dr Selena BattenKim she is not treating the pt and therefore she is unable to complete an order for the Vertaloc back brace.

## 2014-07-27 ENCOUNTER — Ambulatory Visit: Payer: Commercial Managed Care - HMO | Admitting: Diagnostic Neuroimaging

## 2014-07-30 ENCOUNTER — Telehealth: Payer: Self-pay | Admitting: *Deleted

## 2014-07-30 NOTE — Telephone Encounter (Signed)
I don't think I have been treating this patient for knee pain at all, I did not order

## 2014-07-30 NOTE — Telephone Encounter (Signed)
See previous message as well from Dr Lorelle FormosaHanna Kim/J Lennette BihariFunderburk.  Did we order a brace?

## 2014-07-30 NOTE — Telephone Encounter (Signed)
Rick from [facility] called, follow up call regarding a brace for pt      Reference #    [id number] [id number]

## 2014-08-01 ENCOUNTER — Telehealth: Payer: Self-pay | Admitting: *Deleted

## 2014-08-01 NOTE — Telephone Encounter (Signed)
Patient has an appt Monday 08/06/2014, would like to change it from a regular appt to an epidural procedure

## 2014-08-02 NOTE — Telephone Encounter (Signed)
May change to left S1 epidural steroid injection, if the visit is with me on Monday

## 2014-08-02 NOTE — Telephone Encounter (Signed)
Pt appointment has been edit to procedure as Dr. Kirtland BouchardK noted.Marland Kitchen..Marland Kitchen

## 2014-08-06 ENCOUNTER — Telehealth: Payer: Self-pay | Admitting: *Deleted

## 2014-08-06 ENCOUNTER — Encounter: Payer: Commercial Managed Care - HMO | Attending: Physical Medicine & Rehabilitation

## 2014-08-06 ENCOUNTER — Encounter: Payer: Self-pay | Admitting: Physical Medicine & Rehabilitation

## 2014-08-06 ENCOUNTER — Ambulatory Visit (HOSPITAL_BASED_OUTPATIENT_CLINIC_OR_DEPARTMENT_OTHER): Payer: Medicare HMO | Admitting: Physical Medicine & Rehabilitation

## 2014-08-06 VITALS — BP 125/90 | HR 88 | Resp 14 | Ht 65.0 in | Wt 224.0 lb

## 2014-08-06 DIAGNOSIS — M5416 Radiculopathy, lumbar region: Secondary | ICD-10-CM

## 2014-08-06 NOTE — Progress Notes (Signed)
  PROCEDURE RECORD [facility]   Name: Gary Clark DOB:Feb 06, 1971 MRN: [id number]  Date:08/06/2014  Physician: Claudette LawsAndrew Kirsteins, MD    Nurse/CMA: Selassie Spatafore   Allergies: No Known Allergies  Consent Signed: Yes.    Is patient diabetic? No.  CBG today? .  Pregnant: No. LMP: No LMP for male patient. (age 44-55)  Anticoagulants: no Anti-inflammatory: no Antibiotics: no  Procedure: Sacroiliac Epidural Inject -  Left  Position: Prone Start Time: 11:07am  End Time: 11:16am  Fluoro Time:   13  RN/CMA Kaedence Connelly  Razia Screws     Time 10:41 11:15    BP 125/70 144/90    Pulse 78 74    Respirations 14 14    O2 Sat 99 98    S/S 6 6    Pain Level 5/10  2/10     D/C home with Renae FicklePaul, patient Dad, patient A & O X 3, D/C instructions reviewed, and sits independently.

## 2014-08-06 NOTE — Patient Instructions (Addendum)
We'll monitor the results of this injection. We went to the S1 level on the left side. iF IT IS HELPFUL BUT STARTS WEARING OFF WE'LL REPEAT IN EARLY jANUARY

## 2014-08-14 ENCOUNTER — Ambulatory Visit: Payer: Commercial Managed Care - HMO | Admitting: Diagnostic Neuroimaging

## 2014-08-16 ENCOUNTER — Other Ambulatory Visit: Payer: Self-pay | Admitting: Family Medicine

## 2014-09-08 ENCOUNTER — Emergency Department (INDEPENDENT_AMBULATORY_CARE_PROVIDER_SITE_OTHER)
Admission: EM | Admit: 2014-09-08 | Discharge: 2014-09-08 | Disposition: A | Discharge status: Home / Self Care | Payer: Commercial Managed Care - HMO | Source: Home / Self Care | Attending: Emergency Medicine | Admitting: Emergency Medicine

## 2014-09-08 ENCOUNTER — Encounter (HOSPITAL_COMMUNITY): Payer: Self-pay | Admitting: Emergency Medicine

## 2014-09-08 DIAGNOSIS — J069 Acute upper respiratory infection, unspecified: Secondary | ICD-10-CM

## 2014-09-08 DIAGNOSIS — J0110 Acute frontal sinusitis, unspecified: Secondary | ICD-10-CM

## 2014-09-08 LAB — POCT RAPID STREP A: Streptococcus, Group A Screen (Direct): NEGATIVE

## 2014-09-08 MED ORDER — HYDROCOD POLST-CHLORPHEN POLST 10-8 MG/5ML PO LQCR: 5.0000 mL | Freq: Two times a day (BID) | ORAL | Status: DC | PRN

## 2014-09-08 MED ORDER — PREDNISONE 20 MG PO TABS: 20.0000 mg | ORAL_TABLET | Freq: Two times a day (BID) | ORAL | Status: DC

## 2014-09-08 MED ORDER — ALBUTEROL SULFATE HFA 108 (90 BASE) MCG/ACT IN AERS: 1.0000 | INHALATION_SPRAY | Freq: Four times a day (QID) | RESPIRATORY_TRACT | Status: DC | PRN

## 2014-09-08 MED ORDER — HYDROCODONE-ACETAMINOPHEN 5-325 MG PO TABS: ORAL_TABLET | ORAL | Status: DC

## 2014-09-08 MED ORDER — AMOXICILLIN 500 MG PO CAPS: 1000.0000 mg | ORAL_CAPSULE | Freq: Three times a day (TID) | ORAL | Status: DC

## 2014-09-08 MED ORDER — FLUTICASONE PROPIONATE 50 MCG/ACT NA SUSP: 2.0000 | Freq: Every day | NASAL | Status: DC

## 2014-09-08 NOTE — ED Provider Notes (Signed)
Chief Complaint   Sore Throat; Cough; and Headache   History of Present Illness   Gary KeensJerry Stangler is a 43 year old male visiting from out of town whose had a three-day history of nonproductive cough, wheezing, nasal congestion, headache, sinus pressure, hoarseness, and sore throat. He denies any fever, chills, or GI symptoms. He has a history of migraine headaches and he's afraid that this will turn into a migraine.  Review of Systems   Other than as noted above, the patient denies any of the following symptoms: Systemic:  No fevers, chills, sweats, or myalgias. Eye:  No redness or discharge. ENT:  No ear pain, headache, nasal congestion, drainage, sinus pressure, or sore throat. Neck:  No neck pain, stiffness, or swollen glands. Lungs:  No cough, sputum production, hemoptysis, wheezing, chest tightness, shortness of breath or chest pain. GI:  No abdominal pain, nausea, vomiting or diarrhea.  PMFSH   Past medical history, family history, social history, meds, and allergies were reviewed. He has high blood pressure and takes it her thiazide, lisinopril, and citalopram.  Physical exam   Vital signs:  BP 138/96 mmHg  Pulse 85  Temp(Src) 98.8 F (37.1 C) (Oral)  Resp 20  SpO2 97% General:  Alert and oriented.  In no distress.  Skin warm and dry. Eye:  No conjunctival injection or drainage. Lids were normal. ENT:  TMs and canals were normal, without erythema or inflammation.  Nasal mucosa was clear and uncongested, without drainage.  Mucous membranes were moist.  Pharynx was clear with no exudate or drainage.  There were no oral ulcerations or lesions. Neck:  Supple, no adenopathy, tenderness or mass. Lungs:  No respiratory distress.  Lungs were clear to auscultation, without wheezes, rales or rhonchi.  Breath sounds were clear and equal bilaterally.  Heart:  Regular rhythm, without gallops, murmers or rubs. Skin:  Clear, warm, and dry, without rash or lesions.  Labs   Results for  orders placed or performed during the hospital encounter of 09/08/14  POCT rapid strep A [facility]  Result Value Ref Range   Streptococcus, Group A Screen (Direct) NEGATIVE NEGATIVE    Assessment     The primary encounter diagnosis was Viral URI. A diagnosis of Acute frontal sinusitis, recurrence not specified was also pertinent to this visit.  Plan    1.  Meds:  The following meds were prescribed:   Discharge Medication List as of 09/08/2014 12:27 PM    START taking these medications   Details  albuterol (PROVENTIL HFA;VENTOLIN HFA) 108 (90 BASE) MCG/ACT inhaler Inhale 1-2 puffs into the lungs every 6 (six) hours as needed for wheezing or shortness of breath., Starting 09/08/2014, Until Discontinued, Normal    amoxicillin (AMOXIL) 500 MG capsule Take 2 capsules (1,000 mg total) by mouth 3 (three) times daily., Starting 09/08/2014, Until Discontinued, Normal    chlorpheniramine-HYDROcodone (TUSSIONEX) 10-8 MG/5ML LQCR Take 5 mLs by mouth every 12 (twelve) hours as needed for cough., Starting 09/08/2014, Until Discontinued, Normal    HYDROcodone-acetaminophen (NORCO/VICODIN) 5-325 MG per tablet 1 to 2 tabs every 4 to 6 hours as needed for pain., Print        2.  Patient Education/Counseling:  The patient was given appropriate handouts, self care instructions, and instructed in symptomatic relief.  Instructed to get extra fluids and extra rest.    3.  Follow up:  The patient was told to follow up here if no better in 3 to 4 days, or sooner if becoming  worse in any way, and given some red flag symptoms such as increasing fever, difficulty breathing, chest pain, or persistent vomiting which would prompt immediate return.       Reuben Likesavid C Lathon Adan, MD 09/08/14 [phone]

## 2014-09-08 NOTE — Discharge Instructions (Signed)
Most upper respiratory infections are caused by viruses and do not require antibiotics.  We try to save the antibiotics for when we really need them to prevent bacteria from developing resistance to them.  Here are a few hints about things that can be done at home to help get over an upper respiratory infection quicker: ° °Get extra sleep and extra fluids.  Get 7 to 9 hours of sleep per night and 6 to 8 glasses of water a day.  Getting extra sleep keeps the immune system from getting run down.  Most people with an upper respiratory infection are a little dehydrated.  The extra fluids also keep the secretions liquified and easier to deal with.  Also, get extra vitamin C.  4000 mg per day is the recommended dose. °For the aches, headache, and fever, acetaminophen or ibuprofen are helpful.  These can be alternated every 4 hours.  People with liver disease should avoid large amounts of acetaminophen, and people with ulcer disease, gastroesophageal reflux, gastritis, congestive heart failure, chronic kidney disease, coronary artery disease and the elderly should avoid ibuprofen. °For nasal congestion try Mucinex-D, or if you're having lots of sneezing or clear nasal drainage use Zyrtec-D. People with high blood pressure can take these if their blood pressure is controlled, if not, it's best to avoid the forms with a "D" (decongestants).  You can use the plain Mucinex, Allegra, Claritin, or Zyrtec even if your blood pressure is not controlled.   °A Saline nasal spray such as Ocean Spray can also help.  You can add a decongestant sprays such as Afrin, but you should not use the decongestant sprays for more than 3 or 4 days since they can be habituating.  Breathe Rite nasal strips can also offer a non-drug alternative treatment to nasal congestion, especially at night. °For people with symptoms of sinusitis, sleeping with your head elevated can be helpful.  For sinus pain, moist, hot compresses to the face may provide some  relief.  Many people find that inhaling steam as in a shower or from a pot of steaming water can help. °For any viral infection, zinc containing lozenges such as Cold-Eze or Zicam are helpful.  Zinc helps to fight viral infection.  Hot salt water gargles (8 oz of hot water, 1/2 tsp of table salt, and a pinch of baking soda) can give relief as well as hot beverages such as hot tea.  Sucrets extra strength lozenges will help the sore throat.  °For the cough, take Delsym 2 tsp every 12 hours.  It has also been found recently that Aleve can help control a cough.  The dose is 1 to 2 tablets twice daily with food.  This can be combined with Delsym. (Note, if you are taking ibuprofen, you should not take Aleve as well--take one or the other.) °A cool mist vaporizer will help keep your mucous membranes from drying out.  ° °It's important when you have an upper respiratory infection not to pass the infection to others.  This involves being very careful about the following: ° °Frequent hand washing or use of hand sanitizer, especially after coughing, sneezing, blowing your nose or touching your face, nose or eyes. °Do not shake hands or touch anyone and try to avoid touching surfaces that other people use such as doorknobs, shopping carts, telephones and computer keyboards. °Use tissues and dispose of them properly in a garbage can or ziplock bag. °Cough into your sleeve. °Do not let others eat or   drink after you. ° °It's also important to recognize the signs of serious illness and get evaluated if they occur: °Any respiratory infection that lasts more than 7 to 10 days.  Yellow nasal drainage and sputum are not reliable indicators of a bacterial infection, but if they last for more than 1 week, see your doctor. °Fever and sore throat can indicate strep. °Fever and cough can indicate influenza or pneumonia. °Any kind of severe symptom such as difficulty breathing, intractable vomiting, or severe pain should prompt you to see  a doctor as soon as possible. ° ° °Your body's immune system is really the thing that will get rid of this infection.  Your immune system is comprised of 2 types of specialized cells called T cells and B cells.  T cells coordinate the array of cells in your body that engulf invading bacteria or viruses while B cells orchestrate the production of antibodies that neutralize infection.  Anything we do or any medications we give you, will just strengthen your immune system or help it clear up the infection quicker.  Here are a few helpful hints to improve your immune system to help overcome this illness or to prevent future infections: °· A few vitamins can improve the health of your immune system.  That's why your diet should include plenty of fruits, vegetables, fish, nuts, and whole grains. °· Vitamin A and bet-carotene can increase the cells that fight infections (T cells and B cells).  Vitamin A is abundant in dark greens and orange vegetables such as spinach, greens, sweet potatoes, and carrots. °· Vitamin B6 contributes to the maturation of white blood cells, the cells that fight disease.  Foods with vitamin B6 include cold cereal and bananas. °· Vitamin C is credited with preventing colds because it increases white blood cells and also prevents cellular damage.  Citrus fruits, peaches and green and red bell peppers are all hight in vitamin C. °· Vitamin E is an anti-oxidant that encourages the production of natural killer cells which reject foreign invaders and B cells that produce antibodies.  Foods high in vitamin E include wheat germ, nuts and seeds. °· Foods high in omega-3 fatty acids found in foods like salmon, tuna and mackerel boost your immune system and help cells to engulf and absorb germs. °· Probiotics are good bacteria that increase your T cells.  These can be found in yogurt and are available in supplements such as Culturelle or Align. °· Moderate exercise increases the strength of your immune  system and your ability to recover from illness.  I suggest 3 to 5 moderate intensity 30 minute workouts per week.   °· Sleep is another component of maintaining a strong immune system.  It enables your body to recuperate from the day's activities, stress and work.  My recommendation is to get between 7 and 9 hours of sleep per night. °· If you smoke, try to quit completely or at least cut down.  Drink alcohol only in moderation if at all.  No more than 2 drinks daily for men or 1 for women. °· Get a flu vaccine early in the fall or if you have not gotten one yet, once this illness has run its course.  If you are over 65, a smoker, or an asthmatic, get a pneumococcal vaccine. °· My final recommendation is to maintain a healthy weight.  Excess weight can impair the immune system by interfering with the way the immune system deals with invading viruses or   bacteria.   Sinusitis Sinusitis is redness, soreness, and inflammation of the paranasal sinuses. Paranasal sinuses are air pockets within the bones of your face (beneath the eyes, the middle of the forehead, or above the eyes). In healthy paranasal sinuses, mucus is able to drain out, and air is able to circulate through them by way of your nose. However, when your paranasal sinuses are inflamed, mucus and air can become trapped. This can allow bacteria and other germs to grow and cause infection. Sinusitis can develop quickly and last only a short time (acute) or continue over a long period (chronic). Sinusitis that lasts for more than 12 weeks is considered chronic.  CAUSES  Causes of sinusitis include:  Allergies.  Structural abnormalities, such as displacement of the cartilage that separates your nostrils (deviated septum), which can decrease the air flow through your nose and sinuses and affect sinus drainage.  Functional abnormalities, such as when the small hairs (cilia) that line your sinuses and help remove mucus do not work properly or are not  present. SIGNS AND SYMPTOMS  Symptoms of acute and chronic sinusitis are the same. The primary symptoms are pain and pressure around the affected sinuses. Other symptoms include:  Upper toothache.  Earache.  Headache.  Bad breath.  Decreased sense of smell and taste.  A cough, which worsens when you are lying flat.  Fatigue.  Fever.  Thick drainage from your nose, which often is green and may contain pus (purulent).  Swelling and warmth over the affected sinuses. DIAGNOSIS  Your health care provider will perform a physical exam. During the exam, your health care provider may:  Look in your nose for signs of abnormal growths in your nostrils (nasal polyps).  Tap over the affected sinus to check for signs of infection.  View the inside of your sinuses (endoscopy) using an imaging device that has a light attached (endoscope). If your health care provider suspects that you have chronic sinusitis, one or more of the following tests may be recommended:  Allergy tests.  Nasal culture. A sample of mucus is taken from your nose, sent to a lab, and screened for bacteria.  Nasal cytology. A sample of mucus is taken from your nose and examined by your health care provider to determine if your sinusitis is related to an allergy. TREATMENT  Most cases of acute sinusitis are related to a viral infection and will resolve on their own within 10 days. Sometimes medicines are prescribed to help relieve symptoms (pain medicine, decongestants, nasal steroid sprays, or saline sprays).  However, for sinusitis related to a bacterial infection, your health care provider will prescribe antibiotic medicines. These are medicines that will help kill the bacteria causing the infection.  Rarely, sinusitis is caused by a fungal infection. In theses cases, your health care provider will prescribe antifungal medicine. For some cases of chronic sinusitis, surgery is needed. Generally, these are cases in which  sinusitis recurs more than 3 times per year, despite other treatments. HOME CARE INSTRUCTIONS   Drink plenty of water. Water helps thin the mucus so your sinuses can drain more easily.  Use a humidifier.  Inhale steam 3 to 4 times a day (for example, sit in the bathroom with the shower running).  Apply a warm, moist washcloth to your face 3 to 4 times a day, or as directed by your health care provider.  Use saline nasal sprays to help moisten and clean your sinuses.  Take medicines only as directed by your  health care provider.  If you were prescribed either an antibiotic or antifungal medicine, finish it all even if you start to feel better. SEEK IMMEDIATE MEDICAL CARE IF:  You have increasing pain or severe headaches.  You have nausea, vomiting, or drowsiness.  You have swelling around your face.  You have vision problems.  You have a stiff neck.  You have difficulty breathing. MAKE SURE YOU:   Understand these instructions.  Will watch your condition.  Will get help right away if you are not doing well or get worse. Document Released: 08/31/2005 Document Revised: 01/15/2014 Document Reviewed: 09/15/2011 [facility] Patient Information 2015 [place], [place]. This information is not intended to replace advice given to you by your health care provider. Make sure you discuss any questions you have with your health care provider.

## 2014-09-08 NOTE — ED Notes (Signed)
Pt states that he has had a cough and sore throat since 09/06/2014 and a headache since last night.

## 2014-09-10 LAB — CULTURE, GROUP A STREP

## 2014-09-13 ENCOUNTER — Encounter: Payer: Self-pay | Admitting: Family Medicine

## 2014-09-13 ENCOUNTER — Ambulatory Visit (INDEPENDENT_AMBULATORY_CARE_PROVIDER_SITE_OTHER): Payer: Commercial Managed Care - HMO | Admitting: Family Medicine

## 2014-09-13 VITALS — BP 132/90 | HR 84 | Temp 98.7°F | Ht 65.0 in | Wt 230.5 lb

## 2014-09-13 DIAGNOSIS — J069 Acute upper respiratory infection, unspecified: Secondary | ICD-10-CM

## 2014-09-13 NOTE — Patient Instructions (Signed)
Continue current treatments  See care if worsening, fevers, trouble breathing or not resolving as expected

## 2014-09-13 NOTE — Progress Notes (Signed)
HPI:  -started: about 1 week ago -symptoms:nasal congestion, sore throat, cough, sinus pressure - has had L ear pain, sore throat, PND, hoarseness persisting, sinus pain has resolved -denies:fever, SOB, NVD, tooth pain -has tried: went to ED last week and given amoxicillin for sinusitis, alb prn, prednisone, tussionex and norco (not using this) if had migraine - musinex helps - still on these -sick contacts/travel/risks: denies flu exposure, tick exposure or or Ebola risks - wife had a cold -not taking the tussionex -Hx of: allergies ROS: See pertinent positives and negatives per HPI.  Past Medical History  Diagnosis Date  . Back pain   . Hypertension   . Asthma   . Depression   . Cluster headache   . Stroke     pt states "mini-Stroke"  . Arthritis   . Blood in stool   . Headache(784.0)     frequent   . Allergy   . Hyperlipidemia   . Migraines   . UTI (urinary tract infection)     Past Surgical History  Procedure Laterality Date  . Back surgery    . Lumbar laminectomy/decompression microdiscectomy  2010  . Anterior and posterior spinal fusion  2011    Family History  Problem Relation Age of Onset  . Heart disease Mother   . Hypertension Mother   . Arthritis Father   . Hypertension Father   . Prostate cancer Father   . Miscarriages / Stillbirths Maternal Grandmother   . Heart disease Maternal Grandfather   . Sudden death Maternal Grandfather   . Breast cancer Sister   . Migraines Sister     History   Social History  . Marital Status: Married    Spouse Name: [name]    Number of Children: 1  . Years of Education: 12th   Occupational History  .      n/a   Social History Main Topics  . Smoking status: Never Smoker   . Smokeless tobacco: Never Used  . Alcohol Use: Yes     Comment: once a month  . Drug Use: No  . Sexual Activity: Yes    Birth Control/ Protection: None   Other Topics Concern  . None   Social History Narrative   Patient lives at  home with his family.   Caffeine Use: 2 cups daily    Current outpatient prescriptions: acetaminophen (TYLENOL) 500 MG tablet, Take 1,500 mg by mouth every 4 (four) hours. , Disp: , Rfl: ;  albuterol (PROVENTIL HFA;VENTOLIN HFA) 108 (90 BASE) MCG/ACT inhaler, Inhale 1-2 puffs into the lungs every 6 (six) hours as needed for wheezing or shortness of breath., Disp: 1 Inhaler, Rfl: 0 amoxicillin (AMOXIL) 500 MG capsule, Take 2 capsules (1,000 mg total) by mouth 3 (three) times daily., Disp: 60 capsule, Rfl: 0;  citalopram (CELEXA) 40 MG tablet, Take 1 tablet (40 mg total) by mouth daily., Disp: 30 tablet, Rfl: 3;  fluticasone (FLONASE) 50 MCG/ACT nasal spray, Place 2 sprays into both nostrils daily., Disp: 16 g, Rfl: 0 hydrochlorothiazide (HYDRODIURIL) 25 MG tablet, Take 1 tablet (25 mg total) by mouth daily., Disp: 90 tablet, Rfl: 3;  HYDROcodone-acetaminophen (NORCO/VICODIN) 5-325 MG per tablet, 1 to 2 tabs every 4 to 6 hours as needed for pain., Disp: 20 tablet, Rfl: 0;  ibuprofen (ADVIL,MOTRIN) 200 MG tablet, Take 800 mg by mouth every 6 (six) hours as needed (pain). , Disp: , Rfl:  lisinopril (PRINIVIL,ZESTRIL) 10 MG tablet, Take 10 mg by mouth daily., Disp: , Rfl: ;  methocarbamol (ROBAXIN) 500 MG tablet, Take 1 tablet (500 mg total) by mouth 2 (two) times daily., Disp: 20 tablet, Rfl: 0;  oxyCODONE-acetaminophen (PERCOCET/ROXICET) 5-325 MG per tablet, Take 1-2 tablets by mouth every 6 (six) hours as needed for moderate pain or severe pain., Disp: 15 tablet, Rfl: 0 predniSONE (DELTASONE) 20 MG tablet, Take 1 tablet (20 mg total) by mouth 2 (two) times daily., Disp: 10 tablet, Rfl: 0;  simvastatin (ZOCOR) 40 MG tablet, Take 1 tablet (40 mg total) by mouth at bedtime., Disp: 90 tablet, Rfl: 3;  topiramate (TOPAMAX) 50 MG tablet, Take 50 mg by mouth 3 (three) times daily as needed (migraines)., Disp: , Rfl:   EXAM:  Filed Vitals:   09/13/14 1017  BP: 132/90  Pulse: 84  Temp: 98.7 F (37.1 C)     Body mass index is 38.36 kg/(m^2).  GENERAL: vitals reviewed and listed above, alert, oriented, appears well hydrated and in no acute distress  HEENT: atraumatic, conjunttiva clear, no obvious abnormalities on inspection of external nose and ears, normal appearance of ear canals and TMs except effusion on L - no bulging or erythema, clear nasal congestion, mild post oropharyngeal erythema with PND, no tonsillar edema or exudate, no sinus TTP  NECK: no obvious masses on inspection  LUNGS: clear to auscultation bilaterally, no wheezes, rales or rhonchi, good air movement  CV: HRRR, no peripheral edema  MS: moves all extremities without noticeable abnormality  PSYCH: pleasant and cooperative, no obvious depression or anxiety  ASSESSMENT AND PLAN:  Discussed the following assessment and plan:  Acute upper respiratory infection  -given HPI and exam findings today, a serious infection or illness is unlikely. We discussed potential etiologies, with VURI being most likely - given finding in ear would complete amocicillin, and advised supportive care and monitoring. We discussed treatment side effects, likely course, antibiotic misuse, transmission, and signs of developing a serious illness. -of course, we advised to return or notify a doctor immediately if symptoms worsen or persist or new concerns arise.    Patient Instructions  Continue current treatments  See care if worsening, fevers, trouble breathing or not resolving as expected     Melicia Esqueda R.

## 2014-09-13 NOTE — Progress Notes (Signed)
Pre visit review using our clinic review tool, if applicable. No additional management support is needed unless otherwise documented below in the visit note. 

## 2014-09-18 ENCOUNTER — Telehealth: Payer: Self-pay | Admitting: *Deleted

## 2014-09-18 ENCOUNTER — Ambulatory Visit: Payer: Commercial Managed Care - HMO | Admitting: Physical Medicine & Rehabilitation

## 2014-09-18 NOTE — Telephone Encounter (Signed)
Pt called 09/17/2014 @ 3:39 PM saying he needs to cancel his app today with Dr. Wynn BankerKirsteins @ 1:15 PM. He apologized profusely saying he just got back into town and realized he had scheduled an appt he can't keep.

## 2014-09-27 DIAGNOSIS — R0600 Dyspnea, unspecified: Secondary | ICD-10-CM | POA: Diagnosis not present

## 2014-09-27 DIAGNOSIS — G4733 Obstructive sleep apnea (adult) (pediatric): Secondary | ICD-10-CM | POA: Diagnosis not present

## 2014-09-27 DIAGNOSIS — E669 Obesity, unspecified: Secondary | ICD-10-CM | POA: Diagnosis not present

## 2014-09-27 DIAGNOSIS — R51 Headache: Secondary | ICD-10-CM | POA: Diagnosis not present

## 2014-10-11 ENCOUNTER — Other Ambulatory Visit: Payer: Self-pay | Admitting: Family Medicine

## 2014-10-28 DIAGNOSIS — R51 Headache: Secondary | ICD-10-CM | POA: Diagnosis not present

## 2014-10-28 DIAGNOSIS — G4733 Obstructive sleep apnea (adult) (pediatric): Secondary | ICD-10-CM | POA: Diagnosis not present

## 2014-10-28 DIAGNOSIS — E669 Obesity, unspecified: Secondary | ICD-10-CM | POA: Diagnosis not present

## 2014-10-28 DIAGNOSIS — R0600 Dyspnea, unspecified: Secondary | ICD-10-CM | POA: Diagnosis not present

## 2014-11-21 ENCOUNTER — Other Ambulatory Visit: Payer: Self-pay | Admitting: Family Medicine

## 2014-11-26 DIAGNOSIS — R51 Headache: Secondary | ICD-10-CM | POA: Diagnosis not present

## 2014-11-26 DIAGNOSIS — R0600 Dyspnea, unspecified: Secondary | ICD-10-CM | POA: Diagnosis not present

## 2014-11-26 DIAGNOSIS — E669 Obesity, unspecified: Secondary | ICD-10-CM | POA: Diagnosis not present

## 2014-11-26 DIAGNOSIS — G4733 Obstructive sleep apnea (adult) (pediatric): Secondary | ICD-10-CM | POA: Diagnosis not present

## 2014-12-25 ENCOUNTER — Telehealth: Payer: Self-pay | Admitting: Family Medicine

## 2014-12-25 MED ORDER — CITALOPRAM HYDROBROMIDE 40 MG PO TABS: ORAL_TABLET | ORAL | Status: DC

## 2014-12-25 NOTE — Telephone Encounter (Addendum)
Pt request refill citalopram (CELEXA) 40 MG tablet Pt knows he needs appt, and made appt for 4/19. However, pt is oout of meds and would like  Enough to get him through to his appt  [facility]. [place]

## 2014-12-27 DIAGNOSIS — R51 Headache: Secondary | ICD-10-CM | POA: Diagnosis not present

## 2014-12-27 DIAGNOSIS — E669 Obesity, unspecified: Secondary | ICD-10-CM | POA: Diagnosis not present

## 2014-12-27 DIAGNOSIS — G4733 Obstructive sleep apnea (adult) (pediatric): Secondary | ICD-10-CM | POA: Diagnosis not present

## 2014-12-27 DIAGNOSIS — R0600 Dyspnea, unspecified: Secondary | ICD-10-CM | POA: Diagnosis not present

## 2015-01-01 ENCOUNTER — Ambulatory Visit (INDEPENDENT_AMBULATORY_CARE_PROVIDER_SITE_OTHER): Payer: Commercial Managed Care - HMO | Admitting: Family Medicine

## 2015-01-01 ENCOUNTER — Encounter: Payer: Self-pay | Admitting: Family Medicine

## 2015-01-01 VITALS — BP 132/86 | HR 80 | Temp 98.5°F | Ht 65.0 in | Wt 234.7 lb

## 2015-01-01 DIAGNOSIS — I10 Essential (primary) hypertension: Secondary | ICD-10-CM | POA: Diagnosis not present

## 2015-01-01 DIAGNOSIS — F39 Unspecified mood [affective] disorder: Secondary | ICD-10-CM

## 2015-01-01 DIAGNOSIS — Z6838 Body mass index (BMI) 38.0-38.9, adult: Secondary | ICD-10-CM

## 2015-01-01 DIAGNOSIS — R7309 Other abnormal glucose: Secondary | ICD-10-CM

## 2015-01-01 DIAGNOSIS — E785 Hyperlipidemia, unspecified: Secondary | ICD-10-CM

## 2015-01-01 DIAGNOSIS — G8929 Other chronic pain: Secondary | ICD-10-CM

## 2015-01-01 DIAGNOSIS — Z23 Encounter for immunization: Secondary | ICD-10-CM | POA: Diagnosis not present

## 2015-01-01 DIAGNOSIS — M545 Low back pain, unspecified: Secondary | ICD-10-CM

## 2015-01-01 DIAGNOSIS — R7303 Prediabetes: Secondary | ICD-10-CM

## 2015-01-01 DIAGNOSIS — G43909 Migraine, unspecified, not intractable, without status migrainosus: Secondary | ICD-10-CM

## 2015-01-01 NOTE — Progress Notes (Signed)
Pre visit review using our clinic review tool, if applicable. No additional management support is needed unless otherwise documented below in the visit note. 

## 2015-01-01 NOTE — Progress Notes (Signed)
HPI:  HTN/Prediabetes: -meds: HCTZ -advised lifestyle recs, he was supposed check into bariatric   HLD/Elevated triglycerides/Obesity: -meds: statin -we have discussed wt loss options including meds and surgery and lifestyle recs in the past -concern for psych side effects with most of the meds - he did opt to try orlistat in the past but was too expensive - may consider doing short term -has difficulty adhering to diet and exercise -he wants website info again for bariatric surgery conferences -not fasting today  hx depression:  -on several medications in the past and did not tolerate per my review of his report -meds: celexa -reports: stable -was advised to see psych/get counseling in the past -denies SI, thoughts of self harm  Chronic back pain:  -followed by PMR (Dr. Wynn BankerKirsteins) for his chronic back issues including lumbar post laminectomy syndrome, retrolithesis L3-4, trochanteric bursitis, steroid injections  migraines - followed by neurology, reports had sleep study  Seasonal allergies/mild intermittent asthma: -uses flonase and alb prn -denies SOB, wheezing, frequent asthma symptoms   ROS: See pertinent positives and negatives per HPI.  Past Medical History  Diagnosis Date  . Back pain   . Hypertension   . Asthma   . Depression   . Cluster headache   . Stroke     pt states "mini-Stroke"  . Arthritis   . Blood in stool   . Headache(784.0)     frequent   . Allergy   . Hyperlipidemia   . Migraines   . UTI (urinary tract infection)     Past Surgical History  Procedure Laterality Date  . Back surgery    . Lumbar laminectomy/decompression microdiscectomy  2010  . Anterior and posterior spinal fusion  2011    Family History  Problem Relation Age of Onset  . Heart disease Mother   . Hypertension Mother   . Arthritis Father   . Hypertension Father   . Prostate cancer Father   . Miscarriages / Stillbirths Maternal Grandmother   . Heart disease  Maternal Grandfather   . Sudden death Maternal Grandfather   . Breast cancer Sister   . Migraines Sister     History   Social History  . Marital Status: Married    Spouse Name: [name]  . Number of Children: 1  . Years of Education: 12th   Occupational History  .      n/a   Social History Main Topics  . Smoking status: Never Smoker   . Smokeless tobacco: Never Used  . Alcohol Use: Yes     Comment: once a month  . Drug Use: No  . Sexual Activity: Yes    Birth Control/ Protection: None   Other Topics Concern  . None   Social History Narrative   Patient lives at home with his family.   Caffeine Use: 2 cups daily     Current outpatient prescriptions:  .  acetaminophen (TYLENOL) 500 MG tablet, Take 1,500 mg by mouth every 4 (four) hours. , Disp: , Rfl:  .  albuterol (PROVENTIL HFA;VENTOLIN HFA) 108 (90 BASE) MCG/ACT inhaler, Inhale 1-2 puffs into the lungs every 6 (six) hours as needed for wheezing or shortness of breath., Disp: 1 Inhaler, Rfl: 0 .  citalopram (CELEXA) 40 MG tablet, TAKE ONE TABLET BY MOUTH ONCE DAILY ( PATIENT NEEDS APPOINTMENT), Disp: 30 tablet, Rfl: 0 .  fluticasone (FLONASE) 50 MCG/ACT nasal spray, Place 2 sprays into both nostrils daily., Disp: 16 g, Rfl: 0 .  hydrochlorothiazide (HYDRODIURIL) 25 MG  tablet, Take 1 tablet (25 mg total) by mouth daily., Disp: 90 tablet, Rfl: 3 .  HYDROcodone-acetaminophen (NORCO/VICODIN) 5-325 MG per tablet, 1 to 2 tabs every 4 to 6 hours as needed for pain., Disp: 20 tablet, Rfl: 0 .  ibuprofen (ADVIL,MOTRIN) 200 MG tablet, Take 800 mg by mouth every 6 (six) hours as needed (pain). , Disp: , Rfl:  .  lisinopril (PRINIVIL,ZESTRIL) 10 MG tablet, Take 10 mg by mouth daily., Disp: , Rfl:  .  methocarbamol (ROBAXIN) 500 MG tablet, Take 1 tablet (500 mg total) by mouth 2 (two) times daily., Disp: 20 tablet, Rfl: 0 .  oxyCODONE-acetaminophen (PERCOCET/ROXICET) 5-325 MG per tablet, Take 1-2 tablets by mouth every 6 (six) hours as  needed for moderate pain or severe pain., Disp: 15 tablet, Rfl: 0 .  simvastatin (ZOCOR) 40 MG tablet, Take 1 tablet (40 mg total) by mouth at bedtime., Disp: 90 tablet, Rfl: 3 .  topiramate (TOPAMAX) 50 MG tablet, Take 50 mg by mouth 3 (three) times daily as needed (migraines)., Disp: , Rfl:   EXAM:  Filed Vitals:   01/01/15 1030  BP: 132/86  Pulse: 80  Temp: 98.5 F (36.9 C)    Body mass index is 39.06 kg/(m^2).  GENERAL: vitals reviewed and listed above, alert, oriented, appears well hydrated and in no acute distress  HEENT: atraumatic, conjunttiva clear, no obvious abnormalities on inspection of external nose and ears  NECK: no obvious masses on inspection  LUNGS: clear to auscultation bilaterally, no wheezes, rales or rhonchi, good air movement  CV: HRRR, no peripheral edema  MS: moves all extremities without noticeable abnormality  PSYCH: pleasant and cooperative, no obvious depression or anxiety  ASSESSMENT AND PLAN:  Discussed the following assessment and plan:  Depression -cont celexa  BMI 38.0-38.9,adult -discusssed lifestyle recs, meds, surgery - may try metformin if prediabetes worsening or diabetes, trial orlistat - he may shop around for a the best price, he may try to go to bariatric conferences - website provided  Essential hypertension - Plan: Basic metabolic panel -better on recheck, monitor  Hyperlipemia - Plan: Lipid Panel  Chronic low back pain - hx spinal stenosis, decompression surgery in 2010, 2011 -stable, managed by his specialist  Migraine without status migrainosus, not intractable, unspecified migraine type -stable  Prediabetes - Plan: Hemoglobin A1c  -advised FASTING labs: lipid, hgba1c, BMP -advised Tdap -advised lifestyle changes -Patient advised to return or notify a doctor immediately if symptoms worsen or persist or new concerns arise.  Patient Instructions  BEFORE YOU LEAVE: -schedule fasting lab  appointment -Tdap -follow up in 3-4 month  We recommend the following healthy lifestyle measures: - eat a healthy diet consisting of lots of vegetables, fruits, beans, nuts, seeds, healthy meats such as white chicken and fish   - avoid fried foods, fast food, processed foods, sodas, red meet and other fattening foods.  - get a least 150 minutes of aerobic exercise per week.   Treatment Options for Obesity:  A comprehensive exercise and diet program are advised and are the initial and longterm/lifelong treatment for obesity.  This is a long and initially often difficult process and it will often take years to get to reach optimal health  As you get healthier you will build extra blood volume and lean tissue that is heavier then fatty tissue and you will hit plateaus in your weight - thus I ADVISE YOU DO NOT FOLLOW YOUR WEIGHT ON A SCALE   The safest and healthiest way to  improve you physical and mental health is to: - eat a healthy diet consisting of lots of vegetables, fruits, beans, nuts, seeds, healthy meats such as white chicken and fish and whole grains.  - avoid fried foods, fast food, processed foods, sodas, red meet and other fattening foods.  - get a least 150-300 minutes of aerobic exercise per week.  -reduce stress - counseling, meditation, relaxation to balance other aspects of your life   Medication for Obesity: IF BMI >30 and you have not achieved weight loss through diet and exercise alone, we suggest pharmacologic therapy can be added to diet and exercise.  First Choice:  1)Orlistat (Xenical): -likely the safest in terms of heart and vascular side effects and helps cholesterol - usually recommended for 2 - 4 years -common and or serious side effects include but are not limited to: anaphylaxis, liver toxicity, gas, oily stools, loose stools, fatty stools -150 mg up to 3 times daily only during meals containing fat, diet should be <30% fat -not recommended in: pregnancy,  malabsorption, some kidney stones, anorexia or bulemia history  If diabetes:  1) Metformin, orlistat or  2)Liraglutide (Victoza) -can cause t cell tumors, anaphylaxis, kidney toxicity, pancreatitis, nausea, vomiting, diarrhea, headache -injected:0.6mg  daily for 1 week, then 1.2mg  daily  Surgery is also an option and [place] has many useful resources if you are considering this.  Please check out this website if you wish:  [email or web address]       Gary Clark R.

## 2015-01-01 NOTE — Patient Instructions (Addendum)
BEFORE YOU LEAVE: -schedule fasting lab appointment -Tdap -follow up in 3-4 month  We recommend the following healthy lifestyle measures: - eat a healthy diet consisting of lots of vegetables, fruits, beans, nuts, seeds, healthy meats such as white chicken and fish   - avoid fried foods, fast food, processed foods, sodas, red meet and other fattening foods.  - get a least 150 minutes of aerobic exercise per week.   Treatment Options for Obesity:  A comprehensive exercise and diet program are advised and are the initial and longterm/lifelong treatment for obesity.  This is a long and initially often difficult process and it will often take years to get to reach optimal health  As you get healthier you will build extra blood volume and lean tissue that is heavier then fatty tissue and you will hit plateaus in your weight - thus I ADVISE YOU DO NOT FOLLOW YOUR WEIGHT ON A SCALE   The safest and healthiest way to improve you physical and mental health is to: - eat a healthy diet consisting of lots of vegetables, fruits, beans, nuts, seeds, healthy meats such as white chicken and fish and whole grains.  - avoid fried foods, fast food, processed foods, sodas, red meet and other fattening foods.  - get a least 150-300 minutes of aerobic exercise per week.  -reduce stress - counseling, meditation, relaxation to balance other aspects of your life   Medication for Obesity: IF BMI >30 and you have not achieved weight loss through diet and exercise alone, we suggest pharmacologic therapy can be added to diet and exercise.  First Choice:  1)Orlistat (Xenical): -likely the safest in terms of heart and vascular side effects and helps cholesterol - usually recommended for 2 - 4 years -common and or serious side effects include but are not limited to: anaphylaxis, liver toxicity, gas, oily stools, loose stools, fatty stools -150 mg up to 3 times daily only during meals containing fat, diet should be  <30% fat -not recommended in: pregnancy, malabsorption, some kidney stones, anorexia or bulemia history  If diabetes:  1) Metformin, orlistat or  2)Liraglutide (Victoza) -can cause t cell tumors, anaphylaxis, kidney toxicity, pancreatitis, nausea, vomiting, diarrhea, headache -injected:0.6mg  daily for 1 week, then 1.2mg  daily  Surgery is also an option and [place] has many useful resources if you are considering this.  Please check out this website if you wish:  [email or web address]

## 2015-01-02 ENCOUNTER — Other Ambulatory Visit (INDEPENDENT_AMBULATORY_CARE_PROVIDER_SITE_OTHER): Payer: Commercial Managed Care - HMO

## 2015-01-02 DIAGNOSIS — E785 Hyperlipidemia, unspecified: Secondary | ICD-10-CM | POA: Diagnosis not present

## 2015-01-02 DIAGNOSIS — I10 Essential (primary) hypertension: Secondary | ICD-10-CM | POA: Diagnosis not present

## 2015-01-02 DIAGNOSIS — R7309 Other abnormal glucose: Secondary | ICD-10-CM | POA: Diagnosis not present

## 2015-01-02 DIAGNOSIS — R7303 Prediabetes: Secondary | ICD-10-CM

## 2015-01-02 LAB — BASIC METABOLIC PANEL
BUN: 17 mg/dL (ref 6–23)
CO2: 29 meq/L (ref 19–32)
Calcium: 9.9 mg/dL (ref 8.4–10.5)
Chloride: 102 mEq/L (ref 96–112)
Creatinine, Ser: 0.94 mg/dL (ref 0.40–1.50)
GFR: 92.28 mL/min (ref >60.00)
GLUCOSE: 97 mg/dL (ref 70–99)
Potassium: 4.3 mEq/L (ref 3.5–5.1)
Sodium: 137 mEq/L (ref 135–145)

## 2015-01-02 LAB — LDL CHOLESTEROL, DIRECT: Direct LDL: 181 mg/dL

## 2015-01-02 LAB — LIPID PANEL
CHOL/HDL RATIO: 6
CHOLESTEROL: 258 mg/dL — AB (ref 0–200)
HDL: 44.5 mg/dL (ref >39.00)
NonHDL: 213.5
TRIGLYCERIDES: 243 mg/dL — AB (ref 0.0–149.0)
VLDL: 48.6 mg/dL — AB (ref 0.0–40.0)

## 2015-01-02 LAB — HEMOGLOBIN A1C: Hgb A1c MFr Bld: 6.2 % (ref 4.6–6.5)

## 2015-01-03 ENCOUNTER — Other Ambulatory Visit: Payer: Self-pay | Admitting: Family Medicine

## 2015-01-03 ENCOUNTER — Telehealth: Payer: Self-pay | Admitting: Family Medicine

## 2015-01-03 MED ORDER — ATORVASTATIN CALCIUM 20 MG PO TABS: 40.0000 mg | ORAL_TABLET | Freq: Every day | ORAL | Status: DC

## 2015-01-03 NOTE — Telephone Encounter (Signed)
Patient would like to know the results to his lab work.

## 2015-01-03 NOTE — Telephone Encounter (Signed)
Patient informed. 

## 2015-01-09 ENCOUNTER — Other Ambulatory Visit: Payer: Self-pay | Admitting: Family Medicine

## 2015-01-18 ENCOUNTER — Telehealth: Payer: Self-pay | Admitting: Family Medicine

## 2015-01-18 ENCOUNTER — Other Ambulatory Visit: Payer: Self-pay | Admitting: *Deleted

## 2015-01-18 MED ORDER — LISINOPRIL 10 MG PO TABS: 10.0000 mg | ORAL_TABLET | Freq: Every day | ORAL | Status: DC

## 2015-01-18 MED ORDER — HYDROCHLOROTHIAZIDE 25 MG PO TABS: 25.0000 mg | ORAL_TABLET | Freq: Every day | ORAL | Status: DC

## 2015-01-18 MED ORDER — ATORVASTATIN CALCIUM 40 MG PO TABS: ORAL_TABLET | ORAL | Status: DC

## 2015-01-18 MED ORDER — CITALOPRAM HYDROBROMIDE 40 MG PO TABS: ORAL_TABLET | ORAL | Status: DC

## 2015-01-18 NOTE — Telephone Encounter (Signed)
Patient called stating the pharmacy advised him they did not receive the following re-fills:  citalopram (CELEXA) 40 MG tablet, lisinopril (PRINIVIL,ZESTRIL) 10 MG tablet, and hydrochlorothiazide (HYDRODIURIL) 25 MG tablet. Patient would like for you to re-send the medication to [id number] - [place], [place] - [address]  Patient also states atorvastatin was rejecting at the pharmacy and I have submitted a PA request. Ref PA# [id number]

## 2015-01-18 NOTE — Telephone Encounter (Signed)
He is starting a new medication and needs to titrate. Can use the 40mg  and start with 1/2 tablet if he wishes. Please send per his request and discontinue any stopped dose.thanks.

## 2015-01-18 NOTE — Telephone Encounter (Signed)
Dr Kim-Rx was sent in initially after lab work to begin 20mg  and then take 2 once a day.

## 2015-01-18 NOTE — Telephone Encounter (Signed)
Rxs done. 

## 2015-01-18 NOTE — Telephone Encounter (Signed)
Rx done. 

## 2015-01-18 NOTE — Telephone Encounter (Signed)
I submitted a PA for Atorvastatin 20 mg, twice daily and they sent back a fax with the below question: "why the available dosing of Atorvastatin 40 mg once daily is not effective to treat the patient's condition or likely to be ineffective".  Please advise.

## 2015-01-18 NOTE — Telephone Encounter (Signed)
[facility] wants to know if Dr. Selena BattenKim can prescribe atorvastatin 40 mg, take 1 per day?  There is a quantity limit on the medication of 30 per 30 days.

## 2015-01-26 DIAGNOSIS — G4733 Obstructive sleep apnea (adult) (pediatric): Secondary | ICD-10-CM | POA: Diagnosis not present

## 2015-01-26 DIAGNOSIS — E669 Obesity, unspecified: Secondary | ICD-10-CM | POA: Diagnosis not present

## 2015-01-26 DIAGNOSIS — R0600 Dyspnea, unspecified: Secondary | ICD-10-CM | POA: Diagnosis not present

## 2015-01-26 DIAGNOSIS — R51 Headache: Secondary | ICD-10-CM | POA: Diagnosis not present

## 2015-02-26 DIAGNOSIS — E669 Obesity, unspecified: Secondary | ICD-10-CM | POA: Diagnosis not present

## 2015-02-26 DIAGNOSIS — R51 Headache: Secondary | ICD-10-CM | POA: Diagnosis not present

## 2015-02-26 DIAGNOSIS — G4733 Obstructive sleep apnea (adult) (pediatric): Secondary | ICD-10-CM | POA: Diagnosis not present

## 2015-02-26 DIAGNOSIS — R0600 Dyspnea, unspecified: Secondary | ICD-10-CM | POA: Diagnosis not present

## 2015-03-08 ENCOUNTER — Ambulatory Visit (HOSPITAL_BASED_OUTPATIENT_CLINIC_OR_DEPARTMENT_OTHER): Payer: Commercial Managed Care - HMO | Admitting: Physical Medicine & Rehabilitation

## 2015-03-08 ENCOUNTER — Encounter: Payer: Self-pay | Admitting: Physical Medicine & Rehabilitation

## 2015-03-08 ENCOUNTER — Encounter: Payer: Commercial Managed Care - HMO | Attending: Physical Medicine & Rehabilitation

## 2015-03-08 VITALS — BP 150/90 | HR 86 | Resp 14

## 2015-03-08 DIAGNOSIS — M961 Postlaminectomy syndrome, not elsewhere classified: Secondary | ICD-10-CM

## 2015-03-08 DIAGNOSIS — M5416 Radiculopathy, lumbar region: Secondary | ICD-10-CM

## 2015-03-08 HISTORY — DX: Postlaminectomy syndrome, not elsewhere classified: M96.1

## 2015-03-08 MED ORDER — METHYLPREDNISOLONE 4 MG PO TBPK: ORAL_TABLET | ORAL | Status: DC

## 2015-03-08 NOTE — Patient Instructions (Signed)
Try medrol dosepack first , if no better then sacroiliac injection

## 2015-03-08 NOTE — Progress Notes (Signed)
Gary Clark returns today He is a 44 year old maleWith history of lumbar stenosis who has undergone L4-L5 anterior and posterior fusion in 2011. He had post operative chronic low back pain as well as pain that radiates into the left lower extremity. He has undergone the following injections T12 L1 L2 medial branch blocks in 2015 without much improvement L5 transforaminal injection in 2015 without much improvement Left S1 transforaminal injection on 08/06/2014 with good improvement for about a month per patient report however he did not see me for several months after this injection. In the past he has had good results with prednisone when he's had left lower extremity pain flaring up.  His primary complaint is low back pain his secondary complaint is numbness and pain in the left lower extremity   Please see other notes for review of systems, vital signs, nurse's notes, pain levels  Examination overweight male in no acute distress Mood and affect are appropriate He has tenderness to palpation in the lower lumbar area bilaterally. Negative straight leg raising Motor strength is 5/5 bilateral hip flexor and knee extensor ankle dorsi flexor plantar flexor Sensation intact to pinprick bilateral L2-L3 L4 L5 S1 dermatomal distribution Gait shows no evidence of total drag or knee instability Hip range of motion is mildly reduced with internal/external rotation but no significant pain with internal/external rotation  Impression 1. Lumbar postlaminectomy syndrome with intermittent left S1 radicular pain Acute on chronic pain exacerbation Recommend Depo-Medrol Dosepak Recommend right sacroiliac injectionIf above not helpful  Activity as tolerated  We discussed the radicular pain this is not a major complaint at the current time. We discussed that he may benefit from epidural injections but he does not want to get these on an ongoing basis. We discussed going back to Dr. Shon Baton to consider surgical  options however he does not want to pursue this. Other possibilities include spinal cord stimulation however his primary complaint is back pain rather leg pain so this may be less applicable.

## 2015-03-08 NOTE — Progress Notes (Signed)
Subjective:    Patient ID: Gary Clark, male    DOB: 1971/01/26, 44 y.o.   MRN: [id number]  HPI  PROCEDURE RECORD [facility]   Name: Gary Clark DOB:09-15-1970 MRN: [id number]  Date:03/08/2015  Physician: Claudette Laws, MD    Nurse/CMA: MaryBeth Ginkel   Allergies: No Known Allergies  Consent Signed: Yes.    Is patient diabetic? No.  CBG today? .  Pregnant: No. LMP: No LMP for male patient. (age 22-55)  Anticoagulants: no Anti-inflammatory: no Antibiotics: no  Procedure: Sacroiliac Epidural Inject -  Left  Position: Prone Start Time: 11:07am  End Time: 11:16am  Fluoro Time:   13  RN/CMA MaryBeth Ginkel  MaryBeth Ginkel     Time 10:41 11:15    BP 125/70 144/90    Pulse 78 74    Respirations 14 14    O2 Sat 99 98    S/S 6 6    Pain Level 5/10  2/10     D/C home with Gary Clark, patient Dad, patient A & O X 3, D/C instructions reviewed, and sits independently.        Pain Inventory Average Pain 7 Pain Right Now 8 My pain is constant, sharp, stabbing and aching  In the last 24 hours, has pain interfered with the following? General activity 6 Relation with others 7 Enjoyment of life 10 What TIME of day is your pain at its worst? daytime Sleep (in general) Fair  Pain is worse with: walking, bending, inactivity, standing and some activites Pain improves with: rest and medication Relief from Meds: 2  Mobility walk without assistance how many minutes can you walk? 25 ability to climb steps?  yes do you drive?  yes  Function disabled: date disabled .  Neuro/Psych numbness tingling depression  Prior Studies Any changes since last visit?  no  Physicians involved in your care Any changes since last visit?  no   Family History  Problem Relation Age of Onset  . Heart disease Mother   . Hypertension Mother   . Arthritis Father   . Hypertension Father   . Prostate cancer Father   . Miscarriages /  Stillbirths Maternal Grandmother   . Heart disease Maternal Grandfather   . Sudden death Maternal Grandfather   . Breast cancer Sister   . Migraines Sister    History   Social History  . Marital Status: Married    Spouse Name: Gary Clark  . Number of Children: 1  . Years of Education: 12th   Occupational History  .      n/a   Social History Main Topics  . Smoking status: Never Smoker   . Smokeless tobacco: Never Used  . Alcohol Use: Yes     Comment: once a month  . Drug Use: No  . Sexual Activity: Yes    Birth Control/ Protection: None   Other Topics Concern  . None   Social History Narrative   Patient lives at home with his family.   Caffeine Use: 2 cups daily   Past Surgical History  Procedure Laterality Date  . Back surgery    . Lumbar laminectomy/decompression microdiscectomy  2010  . Anterior and posterior spinal fusion  2011   Past Medical History  Diagnosis Date  . Back pain   . Hypertension   . Asthma   . Depression   . Cluster headache   . Stroke     pt states "mini-Stroke"  . Arthritis   .  Blood in stool   . Headache(784.0)     frequent   . Allergy   . Hyperlipidemia   . Migraines   . UTI (urinary tract infection)    BP 150/90 mmHg  Pulse 86  Resp 14  SpO2 97%  Opioid Risk Score:   Fall Risk Score: Low Fall Risk (0-5 points)`1  Depression screen PHQ 2/9  No flowsheet data found.   Review of Systems  Constitutional:       Pre diabetes - not on any meds & does not test  HENT: Negative.   Eyes: Negative.   Respiratory: Negative.   Cardiovascular: Negative.   Gastrointestinal: Negative.   Endocrine: Negative.   Genitourinary: Negative.   Musculoskeletal: Positive for myalgias, back pain and arthralgias.  Allergic/Immunologic: Negative.   Neurological: Positive for numbness.       Tingling, spasms  Hematological: Negative.   Psychiatric/Behavioral: Positive for dysphoric mood.       Objective:   Physical Exam          Assessment & Plan:

## 2015-03-28 DIAGNOSIS — R51 Headache: Secondary | ICD-10-CM | POA: Diagnosis not present

## 2015-03-28 DIAGNOSIS — R0600 Dyspnea, unspecified: Secondary | ICD-10-CM | POA: Diagnosis not present

## 2015-03-28 DIAGNOSIS — G4733 Obstructive sleep apnea (adult) (pediatric): Secondary | ICD-10-CM | POA: Diagnosis not present

## 2015-03-28 DIAGNOSIS — E669 Obesity, unspecified: Secondary | ICD-10-CM | POA: Diagnosis not present

## 2015-04-02 ENCOUNTER — Ambulatory Visit (INDEPENDENT_AMBULATORY_CARE_PROVIDER_SITE_OTHER): Payer: Commercial Managed Care - HMO | Admitting: Family Medicine

## 2015-04-02 ENCOUNTER — Encounter: Payer: Self-pay | Admitting: Family Medicine

## 2015-04-02 VITALS — BP 126/82 | HR 90 | Temp 98.7°F | Ht 65.0 in | Wt 229.4 lb

## 2015-04-02 DIAGNOSIS — I10 Essential (primary) hypertension: Secondary | ICD-10-CM

## 2015-04-02 DIAGNOSIS — E785 Hyperlipidemia, unspecified: Secondary | ICD-10-CM

## 2015-04-02 DIAGNOSIS — E669 Obesity, unspecified: Secondary | ICD-10-CM | POA: Diagnosis not present

## 2015-04-02 DIAGNOSIS — R7309 Other abnormal glucose: Secondary | ICD-10-CM

## 2015-04-02 DIAGNOSIS — R7303 Prediabetes: Secondary | ICD-10-CM

## 2015-04-02 MED ORDER — METFORMIN HCL 500 MG PO TABS: 500.0000 mg | ORAL_TABLET | Freq: Two times a day (BID) | ORAL | Status: DC

## 2015-04-02 NOTE — Progress Notes (Signed)
HPI:  HTN/Prediabetes/Obesity: -meds: HCTZ -advised lifestyle recs, he was supposed check into bariatric   HLD/Elevated triglycerides/Obesity: -meds: statin - on atorvastatin since 11/2014 -we have discussed wt loss options including meds and surgery and lifestyle recs in the past -concern for psych side effects with most of the meds for wt loss - he did opt to try orlistat in the past but was too expensive - may consider doing short term -has difficulty adhering to diet and exercise  -he wants website info again for bariatric surgery conferences -not fasting today  hx depression:  -on several medications in the past and did not tolerate per my review of his report -meds: celexa -reports: stable -was advised to see psych/get counseling in the past -denies SI, thoughts of self harm  Chronic back pain:  -followed by PMR (Dr. Wynn BankerKirsteins) for his chronic back issues including lumbar post laminectomy syndrome, retrolithesis L3-4, trochanteric bursitis, steroid injections  migraines - followed by neurology, reports had sleep study  Seasonal allergies/mild intermittent asthma: -uses flonase and alb prn -denies SOB, wheezing, frequent asthma symptoms ROS: See pertinent positives and negatives per HPI.  Past Medical History  Diagnosis Date  . Back pain   . Hypertension   . Asthma   . Depression   . Cluster headache   . Stroke     pt states "mini-Stroke"  . Arthritis   . Blood in stool   . Headache(784.0)     frequent   . Allergy   . Hyperlipidemia   . Migraines   . UTI (urinary tract infection)     Past Surgical History  Procedure Laterality Date  . Back surgery    . Lumbar laminectomy/decompression microdiscectomy  2010  . Anterior and posterior spinal fusion  2011    Family History  Problem Relation Age of Onset  . Heart disease Mother   . Hypertension Mother   . Arthritis Father   . Hypertension Father   . Prostate cancer Father   . Miscarriages /  Stillbirths Maternal Grandmother   . Heart disease Maternal Grandfather   . Sudden death Maternal Grandfather   . Breast cancer Sister   . Migraines Sister     History   Social History  . Marital Status: Married    Spouse Name: [name]  . Number of Children: 1  . Years of Education: 12th   Occupational History  .      n/a   Social History Main Topics  . Smoking status: Never Smoker   . Smokeless tobacco: Never Used  . Alcohol Use: Yes     Comment: once a month  . Drug Use: No  . Sexual Activity: Yes    Birth Control/ Protection: None   Other Topics Concern  . None   Social History Narrative   Patient lives at home with his family.   Caffeine Use: 2 cups daily     Current outpatient prescriptions:  .  acetaminophen (TYLENOL) 500 MG tablet, Take 1,500 mg by mouth every 4 (four) hours. , Disp: , Rfl:  .  albuterol (PROVENTIL HFA;VENTOLIN HFA) 108 (90 BASE) MCG/ACT inhaler, Inhale 1-2 puffs into the lungs every 6 (six) hours as needed for wheezing or shortness of breath., Disp: 1 Inhaler, Rfl: 0 .  atorvastatin (LIPITOR) 40 MG tablet, Take 1/2 tablet once a day for 1 week, then one a day, Disp: 90 tablet, Rfl: 0 .  citalopram (CELEXA) 40 MG tablet, TAKE ONE TABLET BY MOUTH ONCE DAILY, Disp: 30 tablet,  Rfl: 3 .  fluticasone (FLONASE) 50 MCG/ACT nasal spray, Place 2 sprays into both nostrils daily., Disp: 16 g, Rfl: 0 .  hydrochlorothiazide (HYDRODIURIL) 25 MG tablet, Take 1 tablet (25 mg total) by mouth daily., Disp: 90 tablet, Rfl: 3 .  ibuprofen (ADVIL,MOTRIN) 200 MG tablet, Take 800 mg by mouth every 6 (six) hours as needed (pain). , Disp: , Rfl:  .  lisinopril (PRINIVIL,ZESTRIL) 10 MG tablet, Take 1 tablet (10 mg total) by mouth daily., Disp: 90 tablet, Rfl: 3 .  methocarbamol (ROBAXIN) 500 MG tablet, Take 1 tablet (500 mg total) by mouth 2 (two) times daily., Disp: 20 tablet, Rfl: 0 .  metFORMIN (GLUCOPHAGE) 500 MG tablet, Take 1 tablet (500 mg total) by mouth 2 (two)  times daily with a meal., Disp: 180 tablet, Rfl: 3  EXAM:  Filed Vitals:   04/02/15 1100  BP: 126/82  Pulse: 90  Temp: 98.7 F (37.1 C)    Body mass index is 38.17 kg/(m^2).  GENERAL: vitals reviewed and listed above, alert, oriented, appears well hydrated and in no acute distress  HEENT: atraumatic, conjunttiva clear, no obvious abnormalities on inspection of external nose and ears  NECK: no obvious masses on inspection  LUNGS: clear to auscultation bilaterally, no wheezes, rales or rhonchi, good air movement  CV: HRRR, no peripheral edema  MS: moves all extremities without noticeable abnormality  PSYCH: pleasant and cooperative, no obvious depression or anxiety  ASSESSMENT AND PLAN:  Discussed the following assessment and plan:  Hyperlipemia  Essential hypertension  Prediabetes - Plan: metFORMIN (GLUCOPHAGE) 500 MG tablet  Obesity  -opted to add metformin low dose for weight and prediabetes -close follow up with labs -ilfestyle recs -Patient advised to return or notify a doctor immediately if symptoms worsen or persist or new concerns arise.  Patient Instructions  BEFORE YOU LEAVE: -appointment in 2 months, come fasting but drink plenty of water  We recommend the following healthy lifestyle measures: - eat a healthy diet consisting of regular small portions of ots of vegetables, fruits, beans, nuts, seeds, healthy meats such as white chicken and fish  - avoid starches, fried foods, fast food, processed foods, sodas, red meet and other fattening foods.  - get a least 150 minutes of aerobic exercise per week.   Start the metformin  daily with breakfast for 2 weeks, then can increase to  twice daily if tolerating with meals      Larrie Fraizer R.

## 2015-04-02 NOTE — Patient Instructions (Addendum)
BEFORE YOU LEAVE: -appointment in 2 months, come fasting but drink plenty of water  We recommend the following healthy lifestyle measures: - eat a healthy diet consisting of regular small portions of ots of vegetables, fruits, beans, nuts, seeds, healthy meats such as white chicken and fish  - avoid starches, fried foods, fast food, processed foods, sodas, red meet and other fattening foods.  - get a least 150 minutes of aerobic exercise per week.   Start the metformin  daily with breakfast for 2 weeks, then can increase to  twice daily if tolerating with meals

## 2015-04-02 NOTE — Progress Notes (Signed)
Pre visit review using our clinic review tool, if applicable. No additional management support is needed unless otherwise documented below in the visit note. 

## 2015-04-09 ENCOUNTER — Encounter: Payer: Commercial Managed Care - HMO | Attending: Physical Medicine & Rehabilitation

## 2015-04-09 ENCOUNTER — Encounter: Payer: Self-pay | Admitting: Physical Medicine & Rehabilitation

## 2015-04-09 ENCOUNTER — Ambulatory Visit (HOSPITAL_BASED_OUTPATIENT_CLINIC_OR_DEPARTMENT_OTHER): Payer: Commercial Managed Care - HMO | Admitting: Physical Medicine & Rehabilitation

## 2015-04-09 VITALS — BP 140/86 | HR 74 | Resp 14

## 2015-04-09 DIAGNOSIS — M5416 Radiculopathy, lumbar region: Secondary | ICD-10-CM | POA: Insufficient documentation

## 2015-04-09 DIAGNOSIS — M961 Postlaminectomy syndrome, not elsewhere classified: Secondary | ICD-10-CM | POA: Insufficient documentation

## 2015-04-09 DIAGNOSIS — M533 Sacrococcygeal disorders, not elsewhere classified: Secondary | ICD-10-CM

## 2015-04-09 DIAGNOSIS — M47817 Spondylosis without myelopathy or radiculopathy, lumbosacral region: Secondary | ICD-10-CM

## 2015-04-09 NOTE — Patient Instructions (Signed)
Sacroiliac injection was performed today. A combination of a naming medicine plus a cortisone medicine was injected. The injection was done under x-ray guidance. This procedure has been performed to help reduce low back and buttocks pain as well as potentially hip pain. The duration of this injection is variable lasting from hours to  Months. It may repeated if needed. 

## 2015-04-09 NOTE — Progress Notes (Signed)

## 2015-04-09 NOTE — Progress Notes (Signed)
  PROCEDURE RECORD [facility]   Name: ARYE WEYENBERG DOB:10-Sep-1971 MRN: [id number]  Date:04/09/2015  Physician: Claudette Laws, MD    Nurse/CMA: Purvis Sheffield  Allergies: No Known Allergies  Consent Signed: Yes.    Is patient diabetic? No.  CBG today? .  Pregnant: No. LMP: No LMP for male patient. (age 45-55)  Anticoagulants: no Anti-inflammatory: no Antibiotics: no  Procedure: Sacoriliac ESI Left  Position: Prone Start Time: 1:21 pm  End Time: 1:23 pm  Fluoro Time: 9  RN/CMA Dung Prien Ken Wessling    Time 12:51 1:30 pm    BP 140/80 142/89    Pulse 74 73    Respirations 14 14    O2 Sat 97 98    S/S 6 6    Pain Level 5/10  /10     D/C home with Wife, patient A & O X 3, D/C instructions reviewed, and sits independently.

## 2015-04-19 DIAGNOSIS — H5210 Myopia, unspecified eye: Secondary | ICD-10-CM | POA: Diagnosis not present

## 2015-04-19 DIAGNOSIS — H5213 Myopia, bilateral: Secondary | ICD-10-CM | POA: Diagnosis not present

## 2015-05-09 DIAGNOSIS — M791 Myalgia: Secondary | ICD-10-CM | POA: Diagnosis not present

## 2015-05-13 ENCOUNTER — Telehealth: Payer: Self-pay | Admitting: Family Medicine

## 2015-05-13 MED ORDER — LISINOPRIL 10 MG PO TABS: 10.0000 mg | ORAL_TABLET | Freq: Every day | ORAL | Status: DC

## 2015-05-13 NOTE — Telephone Encounter (Signed)
Rx done. 

## 2015-05-13 NOTE — Telephone Encounter (Signed)
Pt needs refill on lisinopril 10 mg #90 this will be early refill. Pt left med in [email or web address]

## 2015-06-03 ENCOUNTER — Ambulatory Visit (INDEPENDENT_AMBULATORY_CARE_PROVIDER_SITE_OTHER): Payer: Commercial Managed Care - HMO | Admitting: Family Medicine

## 2015-06-03 ENCOUNTER — Encounter: Payer: Self-pay | Admitting: Family Medicine

## 2015-06-03 VITALS — BP 122/80 | HR 89 | Temp 98.5°F | Ht 65.0 in | Wt 230.6 lb

## 2015-06-03 DIAGNOSIS — E785 Hyperlipidemia, unspecified: Secondary | ICD-10-CM | POA: Diagnosis not present

## 2015-06-03 DIAGNOSIS — R7309 Other abnormal glucose: Secondary | ICD-10-CM

## 2015-06-03 DIAGNOSIS — F329 Major depressive disorder, single episode, unspecified: Secondary | ICD-10-CM | POA: Insufficient documentation

## 2015-06-03 DIAGNOSIS — R7303 Prediabetes: Secondary | ICD-10-CM

## 2015-06-03 DIAGNOSIS — I10 Essential (primary) hypertension: Secondary | ICD-10-CM | POA: Diagnosis not present

## 2015-06-03 DIAGNOSIS — J4520 Mild intermittent asthma, uncomplicated: Secondary | ICD-10-CM | POA: Diagnosis not present

## 2015-06-03 DIAGNOSIS — Z23 Encounter for immunization: Secondary | ICD-10-CM | POA: Diagnosis not present

## 2015-06-03 DIAGNOSIS — F32A Depression, unspecified: Secondary | ICD-10-CM | POA: Insufficient documentation

## 2015-06-03 DIAGNOSIS — E669 Obesity, unspecified: Secondary | ICD-10-CM | POA: Insufficient documentation

## 2015-06-03 DIAGNOSIS — F3340 Major depressive disorder, recurrent, in remission, unspecified: Secondary | ICD-10-CM | POA: Insufficient documentation

## 2015-06-03 LAB — BASIC METABOLIC PANEL
BUN: 23 mg/dL (ref 6–23)
CHLORIDE: 103 meq/L (ref 96–112)
CO2: 30 mEq/L (ref 19–32)
Calcium: 10 mg/dL (ref 8.4–10.5)
Creatinine, Ser: 1 mg/dL (ref 0.40–1.50)
GFR: 85.76 mL/min (ref >60.00)
GLUCOSE: 120 mg/dL — AB (ref 70–99)
POTASSIUM: 4.7 meq/L (ref 3.5–5.1)
SODIUM: 140 meq/L (ref 135–145)

## 2015-06-03 LAB — HEMOGLOBIN A1C: HEMOGLOBIN A1C: 5.9 % (ref 4.6–6.5)

## 2015-06-03 LAB — LIPID PANEL
CHOL/HDL RATIO: 6
Cholesterol: 256 mg/dL — ABNORMAL HIGH (ref 0–200)
HDL: 44.5 mg/dL (ref >39.00)
NonHDL: 211.96
Triglycerides: 276 mg/dL — ABNORMAL HIGH (ref 0.0–149.0)
VLDL: 55.2 mg/dL — AB (ref 0.0–40.0)

## 2015-06-03 LAB — LDL CHOLESTEROL, DIRECT: Direct LDL: 181 mg/dL

## 2015-06-03 NOTE — Patient Instructions (Signed)
BEFORE YOU LEAVE: -labs -flu shot -follow up in 4 months  We recommend the following healthy lifestyle measures: - eat a healthy diet consisting small portions of vegetables, fruits, beans, nuts, seeds, healthy meats such as white chicken and fish and whole grains.  - avoid sweets, white starches, fried foods, fast food, processed foods, sodas, red meet and other fattening foods.  - get a least 150-300 minutes of aerobic exercise per week.

## 2015-06-03 NOTE — Progress Notes (Signed)
Pre visit review using our clinic review tool, if applicable. No additional management support is needed unless otherwise documented below in the visit note. 

## 2015-06-03 NOTE — Progress Notes (Signed)
HPI: HTN/Prediabetes/Obesity: -meds: HCTZ -advised lifestyle recs -started metformin last visit - reports tolerating ok   HLD/Elevated triglycerides/Obesity: -meds: statin - on atorvastatin since 11/2014 -we have discussed wt loss options including meds and surgery and lifestyle recs in the past -concern for psych side effects with most of the meds for wt loss - he did opt to try orlistat in the past but was too expensive - may consider doing short term -has difficulty adhering to diet and exercise  -he wants website info again for bariatric surgery conferences  hx depression:  -on several medications in the past and did not tolerate per my review of his report -meds: celexa -reports: stable -was advised to see psych/get counseling in the past -denies SI, thoughts of self harm  Chronic back pain:  -followed by PMR (Dr. 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, trochanteric bursitis, steroid injections  migraines - followed by neurology, reports had sleep study  Seasonal allergies/mild intermittent asthma: -uses flonase and alb prn -denies SOB, wheezing, frequent asthma symptoms   ROS: See pertinent positives and negatives per HPI.  Past Medical History  Diagnosis Date  . Back pain   . Hypertension   . Asthma   . Depression   . Cluster headache   . Stroke     pt states "mini-Stroke"  . Arthritis   . Blood in stool   . Headache(784.0)     frequent   . Allergy   . Hyperlipidemia   . Migraines   . UTI (urinary tract infection)   . Lumbosacral spondylosis without myelopathy 06/15/2013  . Postlaminectomy syndrome, lumbar region 03/08/2015    Status post L4-L5 decompression and fusion, anterior/posterior in 2011     Past Surgical History  Procedure Laterality Date  . Back surgery    . Lumbar laminectomy/decompression microdiscectomy  2010  . Anterior and posterior spinal fusion  2011    Family  History  Problem Relation Age of Onset  . Heart disease Mother   . Hypertension Mother   . Arthritis Father   . Hypertension Father   . Prostate cancer Father   . Miscarriages / Stillbirths Maternal Grandmother   . Heart disease Maternal Grandfather   . Sudden death Maternal Grandfather   . Breast cancer Sister   . Migraines Sister     Social History   Social History  . Marital Status: Married    Spouse Name: [name]  . Number of Children: 1  . Years of Education: 12th   Occupational History  .      n/a   Social History Main Topics  . Smoking status: Never Smoker   . Smokeless tobacco: Never Used  . Alcohol Use: Yes     Comment: once a month  . Drug Use: No  . Sexual Activity: Yes    Birth Control/ Protection: None   Other Topics Concern  . None   Social History Narrative   Patient lives at home with his family.   Caffeine Use: 2 cups daily     Current outpatient prescriptions:  .  acetaminophen (TYLENOL) 500 MG tablet, Take 1,500 mg by mouth every 4 (four) hours. , Disp: , Rfl:  .  albuterol (PROVENTIL HFA;VENTOLIN HFA) 108 (90 BASE) MCG/ACT inhaler, Inhale 1-2 puffs into the lungs every 6 (six) hours as needed for wheezing or shortness of breath., Disp: 1 Inhaler, Rfl: 0 .  atorvastatin (LIPITOR) 40 MG tablet, Take 1/2 tablet once a day for  1 week, then one a day, Disp: 90 tablet, Rfl: 0 .  citalopram (CELEXA) 40 MG tablet, TAKE ONE TABLET BY MOUTH ONCE DAILY, Disp: 30 tablet, Rfl: 3 .  fluticasone (FLONASE) 50 MCG/ACT nasal spray, Place 2 sprays into both nostrils daily., Disp: 16 g, Rfl: 0 .  hydrochlorothiazide (HYDRODIURIL) 25 MG tablet, Take 1 tablet (25 mg total) by mouth daily., Disp: 90 tablet, Rfl: 3 .  ibuprofen (ADVIL,MOTRIN) 200 MG tablet, Take 800 mg by mouth every 6 (six) hours as needed (pain). , Disp: , Rfl:  .  lisinopril (PRINIVIL,ZESTRIL) 10 MG tablet, Take 1 tablet (10 mg total) by mouth daily., Disp: 90 tablet, Rfl: 0 .  metFORMIN  (GLUCOPHAGE) 500 MG tablet, Take 1 tablet (500 mg total) by mouth 2 (two) times daily with a meal., Disp: 180 tablet, Rfl: 3 .  methocarbamol (ROBAXIN) 500 MG tablet, Take 1 tablet (500 mg total) by mouth 2 (two) times daily., Disp: 20 tablet, Rfl: 0  EXAM:  Filed Vitals:   06/03/15 0957  BP: 122/80  Pulse: 89  Temp: 98.5 F (36.9 C)    Body mass index is 38.37 kg/(m^2).  GENERAL: vitals reviewed and listed above, alert, oriented, appears well hydrated and in no acute distress  HEENT: atraumatic, conjunttiva clear, no obvious abnormalities on inspection of external nose and ears  NECK: no obvious masses on inspection  LUNGS: clear to auscultation bilaterally, no wheezes, rales or rhonchi, good air movement  CV: HRRR, no peripheral edema  MS: moves all extremities without noticeable abnormality  PSYCH: pleasant and cooperative, no obvious depression or anxiety  ASSESSMENT AND PLAN:  Discussed the following assessment and plan:  Obesity -lifestyle recs discussed  Mild intermittent asthma, uncomplicated -stable  Hyperlipemia - Plan: Lipid Panel -labs today  Essential hypertension - Plan: Basic metabolic panel -stable, labs  Prediabetes - Plan: Hemoglobin A1c -labs today, tolerating metformin  Major depressive disorder, recurrent, in remission -stable, mood good  -Patient advised to return or notify a doctor immediately if symptoms worsen or persist or new concerns arise.  Patient Instructions  BEFORE YOU LEAVE: -labs -flu shot -follow up in 4 months  We recommend the following healthy lifestyle measures: - eat a healthy diet consisting small portions of vegetables, fruits, beans, nuts, seeds, healthy meats such as white chicken and fish and whole grains.  - avoid sweets, white starches, fried foods, fast food, processed foods, sodas, red meet and other fattening foods.  - get a least 150-300 minutes of aerobic exercise per week.       Kriste Basque  R.

## 2015-06-04 MED ORDER — ROSUVASTATIN CALCIUM 20 MG PO TABS: 20.0000 mg | ORAL_TABLET | Freq: Every day | ORAL | Status: DC

## 2015-06-04 NOTE — Addendum Note (Signed)
Addended by: Johnella Moloney on: 06/04/2015 08:35 AM   Modules accepted: Orders, Medications

## 2015-06-04 NOTE — Addendum Note (Signed)
Addended by: Johnella Moloney on: 06/04/2015 08:34 AM   Modules accepted: Orders

## 2015-06-06 DIAGNOSIS — M545 Low back pain: Secondary | ICD-10-CM | POA: Diagnosis not present

## 2015-06-06 DIAGNOSIS — R198 Other specified symptoms and signs involving the digestive system and abdomen: Secondary | ICD-10-CM | POA: Diagnosis not present

## 2015-06-10 ENCOUNTER — Ambulatory Visit (HOSPITAL_BASED_OUTPATIENT_CLINIC_OR_DEPARTMENT_OTHER): Payer: Commercial Managed Care - HMO | Admitting: Physical Medicine & Rehabilitation

## 2015-06-10 ENCOUNTER — Encounter: Payer: Commercial Managed Care - HMO | Attending: Physical Medicine & Rehabilitation

## 2015-06-10 ENCOUNTER — Encounter: Payer: Self-pay | Admitting: Physical Medicine & Rehabilitation

## 2015-06-10 VITALS — BP 143/88 | HR 77

## 2015-06-10 DIAGNOSIS — M5416 Radiculopathy, lumbar region: Secondary | ICD-10-CM | POA: Insufficient documentation

## 2015-06-10 DIAGNOSIS — M545 Low back pain, unspecified: Secondary | ICD-10-CM

## 2015-06-10 DIAGNOSIS — M961 Postlaminectomy syndrome, not elsewhere classified: Secondary | ICD-10-CM

## 2015-06-10 DIAGNOSIS — G8929 Other chronic pain: Secondary | ICD-10-CM | POA: Diagnosis not present

## 2015-06-10 MED ORDER — KETOROLAC TROMETHAMINE 10 MG PO TABS: 10.0000 mg | ORAL_TABLET | Freq: Four times a day (QID) | ORAL | Status: DC | PRN

## 2015-06-10 NOTE — Patient Instructions (Signed)
Back Exercises These exercises may help you when beginning to rehabilitate your injury. Your symptoms may resolve with or without further involvement from your physician, physical therapist or athletic trainer. While completing these exercises, remember:   Restoring tissue flexibility helps normal motion to return to the joints. This allows healthier, less painful movement and activity.  An effective stretch should be held for at least 30 seconds.  A stretch should never be painful. You should only feel a gentle lengthening or release in the stretched tissue. STRETCH - Extension, Prone on Elbows   Lie on your stomach on the floor, a bed will be too soft. Place your palms about shoulder width apart and at the height of your head.  Place your elbows under your shoulders. If this is too painful, stack pillows under your chest.  Allow your body to relax so that your hips drop lower and make contact more completely with the floor.  Hold this position for __________ seconds.  Slowly return to lying flat on the floor. Repeat __________ times. Complete this exercise __________ times per day.  RANGE OF MOTION - Extension, Prone Press Ups   Lie on your stomach on the floor, a bed will be too soft. Place your palms about shoulder width apart and at the height of your head.  Keeping your back as relaxed as possible, slowly straighten your elbows while keeping your hips on the floor. You may adjust the placement of your hands to maximize your comfort. As you gain motion, your hands will come more underneath your shoulders.  Hold this position __________ seconds.  Slowly return to lying flat on the floor. Repeat __________ times. Complete this exercise __________ times per day.  RANGE OF MOTION- Quadruped, Neutral Spine   Assume a hands and knees position on a firm surface. Keep your hands under your shoulders and your knees under your hips. You may place padding under your knees for  comfort.  Drop your head and point your tail bone toward the ground below you. This will round out your low back like an angry cat. Hold this position for __________ seconds.  Slowly lift your head and release your tail bone so that your back sags into a large arch, like an old horse.  Hold this position for __________ seconds.  Repeat this until you feel limber in your low back.  Now, find your "sweet spot." This will be the most comfortable position somewhere between the two previous positions. This is your neutral spine. Once you have found this position, tense your stomach muscles to support your low back.  Hold this position for __________ seconds. Repeat __________ times. Complete this exercise __________ times per day.  STRETCH - Flexion, Single Knee to Chest   Lie on a firm bed or floor with both legs extended in front of you.  Keeping one leg in contact with the floor, bring your opposite knee to your chest. Hold your leg in place by either grabbing behind your thigh or at your knee.  Pull until you feel a gentle stretch in your low back. Hold __________ seconds.  Slowly release your grasp and repeat the exercise with the opposite side. Repeat __________ times. Complete this exercise __________ times per day.  STRETCH - Hamstrings, Standing  Stand or sit and extend your right / left leg, placing your foot on a chair or foot stool  Keeping a slight arch in your low back and your hips straight forward.  Lead with your chest and   lean forward at the waist until you feel a gentle stretch in the back of your right / left knee or thigh. (When done correctly, this exercise requires leaning only a small distance.)  Hold this position for __________ seconds. Repeat __________ times. Complete this stretch __________ times per day. STRENGTHENING - Deep Abdominals, Pelvic Tilt   Lie on a firm bed or floor. Keeping your legs in front of you, bend your knees so they are both pointed  toward the ceiling and your feet are flat on the floor.  Tense your lower abdominal muscles to press your low back into the floor. This motion will rotate your pelvis so that your tail bone is scooping upwards rather than pointing at your feet or into the floor.  With a gentle tension and even breathing, hold this position for __________ seconds. Repeat __________ times. Complete this exercise __________ times per day.  STRENGTHENING - Abdominals, Crunches   Lie on a firm bed or floor. Keeping your legs in front of you, bend your knees so they are both pointed toward the ceiling and your feet are flat on the floor. Cross your arms over your chest.  Slightly tip your chin down without bending your neck.  Tense your abdominals and slowly lift your trunk high enough to just clear your shoulder blades. Lifting higher can put excessive stress on the low back and does not further strengthen your abdominal muscles.  Control your return to the starting position. Repeat __________ times. Complete this exercise __________ times per day.  STRENGTHENING - Quadruped, Opposite UE/LE Lift   Assume a hands and knees position on a firm surface. Keep your hands under your shoulders and your knees under your hips. You may place padding under your knees for comfort.  Find your neutral spine and gently tense your abdominal muscles so that you can maintain this position. Your shoulders and hips should form a rectangle that is parallel with the floor and is not twisted.  Keeping your trunk steady, lift your right hand no higher than your shoulder and then your left leg no higher than your hip. Make sure you are not holding your breath. Hold this position __________ seconds.  Continuing to keep your abdominal muscles tense and your back steady, slowly return to your starting position. Repeat with the opposite arm and leg. Repeat __________ times. Complete this exercise __________ times per day. Document Released:  09/18/2005 Document Revised: 11/23/2011 Document Reviewed: 12/13/2008 Exit[employer] Patient Information 2015 [employer]. This information is not intended to replace advice given to you by your health care provider. Make sure you discuss any questions you have with your health care provider.  

## 2015-06-10 NOTE — Progress Notes (Signed)
Subjective:    Patient ID: Gary Clark, male    DOB: Dec 08, 1970, 44 y.o.   MRN: [id number]  HPI 44 year old male with history of L4-L5 fusion in 2011. This was anterior and posterior approach. He has had chronic low back pain since that time. Occasional radicular Discomfort. About a week ago had increased low back pain. No trauma to the area. No new radicular symptoms. His back pain does radiate toward his groin. He was evaluated by urgent care no Abdominal issues identified. Question of whether patient had any hip issues. Patient states that Toradol was very helpful when he received an IM injection.  No bowel or bladder issues. Pain Inventory Average Pain 6 Pain Right Now 8 My pain is sharp, stabbing and aching  In the last 24 hours, has pain interfered with the following? General activity 7 Relation with others 4 Enjoyment of life 7 What TIME of day is your pain at its worst? night Sleep (in general) Poor  Pain is worse with: bending and sitting Pain improves with: medication Relief from Meds: 3  Mobility walk without assistance how many minutes can you walk? 20 ability to climb steps?  yes do you drive?  yes  Function disabled: date disabled . I need assistance with the following:  dressing  Neuro/Psych depression  Prior Studies Any changes since last visit?  no ED visit with back last week CONTRAST: 20mL MULTIHANCE GADOBENATE DIMEGLUMINE 529 MG/ML IV SOLN  COMPARISON: Lumbar spine CT 10/02/2011  FINDINGS: Numbering continued from prior CT.  3-4 mm retrolisthesis of L3 on L4 is unchanged, as is trace anterolisthesis of L4 on L5. Sequelae of L4-5 PLIF are again identified. Vertebral body heights are preserved without compression fracture. Small Schmorl's nodes are noted at L2-3 and L3-4. There is mild disc space narrowing at L3-4, slightly increased from prior CT. Disc desiccation is noted L2-3 with minimal disc space height loss. Disc desiccation is  also present at L5-S1 with mild disc space height loss. Conus medullaris is normal in signal and terminates at the inferior aspect of T12. Paraspinal soft tissues are unremarkable.  L1-2: Negative.  L2-3: Minimal disc bulge without stenosis, unchanged.  L3-4: Listhesis with uncovering of the disc and possible small central superimposed disc protrusion and mild facet hypertrophy result in mild bilateral lateral recess stenosis and minimal bilateral neural foraminal narrowing, likely unchanged. No spinal canal stenosis.  L4-5: Prior laminectomies and fusion without evidence of residual/ recurrent stenosis. Dorsal epidural postoperative enhancement noted.  L5-S1: Small left central disc protrusion and mild facet arthrosis results in mild left lateral recess narrowing, unchanged. No spinal canal or neural foraminal stenosis.  IMPRESSION: 1. Postoperative changes at L4-5 without evidence of recurrent stenosis. 2. Slightly increased disc space height loss at L3-4 with unchanged retrolisthesis and mild bilateral lateral recess narrowing. Physicians involved in your care Any changes since last visit?  no   Family History  Problem Relation Age of Onset  . Heart disease Mother   . Hypertension Mother   . Arthritis Father   . Hypertension Father   . Prostate cancer Father   . Miscarriages / Stillbirths Maternal Grandmother   . Heart disease Maternal Grandfather   . Sudden death Maternal Grandfather   . Breast cancer Sister   . Migraines Sister    Social History   Social History  . Marital Status: Married    Spouse Name: [name]  . Number of Children: 1  . Years of Education: 12th   Occupational  History  .      n/a   Social History Main Topics  . Smoking status: Never Smoker   . Smokeless tobacco: Never Used  . Alcohol Use: Yes     Comment: once a month  . Drug Use: No  . Sexual Activity: Yes    Birth Control/ Protection: None   Other Topics Concern  .  None   Social History Narrative   Patient lives at home with his family.   Caffeine Use: 2 cups daily   Past Surgical History  Procedure Laterality Date  . Back surgery    . Lumbar laminectomy/decompression microdiscectomy  2010  . Anterior and posterior spinal fusion  2011   Past Medical History  Diagnosis Date  . Back pain   . Hypertension   . Asthma   . Depression   . Cluster headache   . Stroke     pt states "mini-Stroke"  . Arthritis   . Blood in stool   . Headache(784.0)     frequent   . Allergy   . Hyperlipidemia   . Migraines   . UTI (urinary tract infection)   . Lumbosacral spondylosis without myelopathy 06/15/2013  . Postlaminectomy syndrome, lumbar region 03/08/2015    Status post L4-L5 decompression and fusion, anterior/posterior in 2011    BP 143/88 mmHg  Pulse 77  SpO2 96%  Opioid Risk Score:   Fall Risk Score:  `1  Depression screen PHQ 2/9  Depression screen [facility] 2/9 06/10/2015 04/09/2015  Decreased Interest 0 0  Down, Depressed, Hopeless 0 0  PHQ - 2 Score 0 0    Review of Systems  Psychiatric/Behavioral: Positive for dysphoric mood.  All other systems reviewed and are negative.      Objective:   Physical Exam  Constitutional: He is oriented to person, place, and time. He appears well-developed and well-nourished.  HENT:  Head: Normocephalic and atraumatic.  Eyes: Conjunctivae are normal. Pupils are equal, round, and reactive to light.  Musculoskeletal:       Lumbar back: He exhibits decreased range of motion and pain. He exhibits no tenderness and no deformity.  Pain to palpation bilateral L4-L5 paraspinal area.  There is reduced range of motion with flexion and extension lateral bending. Extension and lateral bending are more painful than flexion.  Neurological: He is alert and oriented to person, place, and time. He has normal strength. No sensory deficit. Coordination and gait normal.  Reflex Scores:      Patellar reflexes are 0 on  the right side and 2+ on the left side.      Achilles reflexes are 2+ on the right side and 2+ on the left side. Psychiatric: He has a normal mood and affect.  Nursing note and vitals reviewed.  Bilateral hips no pain with range of motion and no limitation of range of motion. Sensation intact pinprick bilateral L2-L3 L4 L5 S1 dermatomal distribution. Motor strength is 5/5 bilateral hip flexor and extensorplantar flexor      Assessment & Plan:  1. Acute on chronic low back pain. Exacerbation, Primarily axial pain and some radiation to the groin which may be referred pain either from facet or from disc. No evidence of radiculopathy. Patient is worried about a long car trip in early October. We discussed this. I recommend Toradol 20 tablets that he can use during that time. Until then he should  Either take Aleve or ibuprofen but not both.  Return to clinic in 41-Month for bilateral L5-S1  facet intra-articular injections if pain continues.

## 2015-06-20 ENCOUNTER — Other Ambulatory Visit: Payer: Self-pay | Admitting: *Deleted

## 2015-06-20 MED ORDER — KETOROLAC TROMETHAMINE 10 MG PO TABS: 10.0000 mg | ORAL_TABLET | Freq: Four times a day (QID) | ORAL | Status: DC | PRN

## 2015-07-02 ENCOUNTER — Other Ambulatory Visit: Payer: Self-pay | Admitting: Family Medicine

## 2015-07-09 ENCOUNTER — Ambulatory Visit: Payer: Commercial Managed Care - HMO | Admitting: Physical Medicine & Rehabilitation

## 2015-07-09 ENCOUNTER — Encounter: Payer: Commercial Managed Care - HMO | Attending: Physical Medicine & Rehabilitation

## 2015-07-09 DIAGNOSIS — M5416 Radiculopathy, lumbar region: Secondary | ICD-10-CM | POA: Insufficient documentation

## 2015-07-09 DIAGNOSIS — M961 Postlaminectomy syndrome, not elsewhere classified: Secondary | ICD-10-CM | POA: Insufficient documentation

## 2015-08-21 ENCOUNTER — Emergency Department (HOSPITAL_COMMUNITY): Payer: Commercial Managed Care - HMO

## 2015-08-21 ENCOUNTER — Encounter (HOSPITAL_COMMUNITY): Payer: Self-pay

## 2015-08-21 ENCOUNTER — Emergency Department (HOSPITAL_COMMUNITY)
Admission: EM | Admit: 2015-08-21 | Discharge: 2015-08-21 | Disposition: A | Discharge status: Home / Self Care | Payer: Commercial Managed Care - HMO | Attending: Emergency Medicine | Admitting: Emergency Medicine

## 2015-08-21 DIAGNOSIS — I10 Essential (primary) hypertension: Secondary | ICD-10-CM | POA: Diagnosis not present

## 2015-08-21 DIAGNOSIS — E785 Hyperlipidemia, unspecified: Secondary | ICD-10-CM | POA: Insufficient documentation

## 2015-08-21 DIAGNOSIS — Z7984 Long term (current) use of oral hypoglycemic drugs: Secondary | ICD-10-CM | POA: Diagnosis not present

## 2015-08-21 DIAGNOSIS — Z87440 Personal history of urinary (tract) infections: Secondary | ICD-10-CM | POA: Insufficient documentation

## 2015-08-21 DIAGNOSIS — Z79899 Other long term (current) drug therapy: Secondary | ICD-10-CM | POA: Diagnosis not present

## 2015-08-21 DIAGNOSIS — Z8673 Personal history of transient ischemic attack (TIA), and cerebral infarction without residual deficits: Secondary | ICD-10-CM | POA: Insufficient documentation

## 2015-08-21 DIAGNOSIS — G43909 Migraine, unspecified, not intractable, without status migrainosus: Secondary | ICD-10-CM | POA: Insufficient documentation

## 2015-08-21 DIAGNOSIS — Z7951 Long term (current) use of inhaled steroids: Secondary | ICD-10-CM | POA: Diagnosis not present

## 2015-08-21 DIAGNOSIS — R109 Unspecified abdominal pain: Secondary | ICD-10-CM | POA: Diagnosis not present

## 2015-08-21 DIAGNOSIS — N201 Calculus of ureter: Secondary | ICD-10-CM

## 2015-08-21 DIAGNOSIS — M1990 Unspecified osteoarthritis, unspecified site: Secondary | ICD-10-CM | POA: Insufficient documentation

## 2015-08-21 DIAGNOSIS — F329 Major depressive disorder, single episode, unspecified: Secondary | ICD-10-CM | POA: Diagnosis not present

## 2015-08-21 DIAGNOSIS — J45909 Unspecified asthma, uncomplicated: Secondary | ICD-10-CM | POA: Diagnosis not present

## 2015-08-21 DIAGNOSIS — N132 Hydronephrosis with renal and ureteral calculous obstruction: Secondary | ICD-10-CM | POA: Diagnosis not present

## 2015-08-21 MED ORDER — KETOROLAC TROMETHAMINE 30 MG/ML IJ SOLN
30.0000 mg | Freq: Once | INTRAMUSCULAR | Status: AC
  Administered 2015-08-21: 30 mg via INTRAVENOUS
  Filled 2015-08-21: qty 1

## 2015-08-21 MED ORDER — HYDROMORPHONE HCL 1 MG/ML IJ SOLN
1.0000 mg | Freq: Once | INTRAMUSCULAR | Status: AC
Start: 2015-08-21 — End: 2015-08-21
  Administered 2015-08-21: 1 mg via INTRAVENOUS
  Filled 2015-08-21: qty 1

## 2015-08-21 MED ORDER — TAMSULOSIN HCL 0.4 MG PO CAPS: 0.4000 mg | ORAL_CAPSULE | Freq: Every day | ORAL | Status: DC

## 2015-08-21 MED ORDER — OXYCODONE-ACETAMINOPHEN 5-325 MG PO TABS: 1.0000 | ORAL_TABLET | Freq: Four times a day (QID) | ORAL | Status: DC | PRN

## 2015-08-21 NOTE — Discharge Instructions (Signed)
Follow-up with the urologist provided.  Return here as needed.  Increase your fluid intake, rest as much as possible

## 2015-08-21 NOTE — ED Provider Notes (Signed)
CSN: [id number]     Arrival date & time 08/21/15  0802 History   First MD Initiated Contact with Patient 08/21/15 0813     Chief Complaint  Patient presents with  . Flank Pain    HPI Patient is a 44 y/o WM with a PMH of hypertension and nephrolithiasis in 2007 who presents today with sudden onset L flank pain that began early this morning.  Pain is worsening, is sharp in character, and rated 8/10. Patient has urinated once since the pain began. While driving this morning he became nauseated, diaphoretic, and prescyncopal secondary to the pain.  Denies scrotal pain, fever, dysuria, hematuria, SOB, constipation, or diarrhea.  He states the pain he is experiencing now is comparable to the pain he had during his last kidney Fero.    Past Medical History  Diagnosis Date  . Back pain   . Hypertension   . Asthma   . Depression   . Cluster headache   . Stroke [facility](HCC)     pt states "mini-Stroke"  . Arthritis   . Blood in stool   . Headache(784.0)     frequent   . Allergy   . Hyperlipidemia   . Migraines   . UTI (urinary tract infection)   . Lumbosacral spondylosis without myelopathy 06/15/2013  . Postlaminectomy syndrome, lumbar region 03/08/2015    Status post L4-L5 decompression and fusion, anterior/posterior in 2011    Past Surgical History  Procedure Laterality Date  . Back surgery    . Lumbar laminectomy/decompression microdiscectomy  2010  . Anterior and posterior spinal fusion  2011   Family History  Problem Relation Age of Onset  . Heart disease Mother   . Hypertension Mother   . Arthritis Father   . Hypertension Father   . Prostate cancer Father   . Miscarriages / Stillbirths Maternal Grandmother   . Heart disease Maternal Grandfather   . Sudden death Maternal Grandfather   . Breast cancer Sister   . Migraines Sister    Social History  Substance Use Topics  . Smoking status: Never Smoker   . Smokeless tobacco: Never Used  . Alcohol Use: Yes     Comment: once a month     Review of Systems All other systems negative except as documented in the HPI. All pertinent positives and negatives as reviewed in the HPI.    Allergies  Review of patient's allergies indicates no known allergies.  Home Medications   Prior to Admission medications   Medication Sig Start Date End Date Taking? Authorizing Provider  acetaminophen (TYLENOL) 500 MG tablet Take 1,500 mg by mouth every 4 (four) hours.     Historical Provider, MD  albuterol (PROVENTIL HFA;VENTOLIN HFA) 108 (90 BASE) MCG/ACT inhaler Inhale 1-2 puffs into the lungs every 6 (six) hours as needed for wheezing or shortness of breath. 09/08/14   Reuben Likesavid C Keller, MD  citalopram (CELEXA) 40 MG tablet TAKE ONE TABLET BY MOUTH ONCE DAILY 07/02/15   Terressa KoyanagiHannah R Kim, DO  fluticasone [facility](FLONASE) 50 MCG/ACT nasal spray Place 2 sprays into both nostrils daily. 09/08/14   Reuben Likesavid C Keller, MD  hydrochlorothiazide (HYDRODIURIL) 25 MG tablet Take 1 tablet (25 mg total) by mouth daily. 01/18/15   Terressa KoyanagiHannah R Kim, DO  ibuprofen (ADVIL,MOTRIN) 200 MG tablet Take 800 mg by mouth every 6 (six) hours as needed (pain).  10/30/13   Historical Provider, MD  ketorolac (TORADOL) 10 MG tablet Take 1 tablet (10 mg total) by mouth every 6 (six)  hours as needed. 06/20/15   Erick Colace, MD  lisinopril (PRINIVIL,ZESTRIL) 10 MG tablet Take 1 tablet (10 mg total) by mouth daily. 05/13/15   Terressa Koyanagi, DO  metFORMIN (GLUCOPHAGE) 500 MG tablet Take 1 tablet (500 mg total) by mouth 2 (two) times daily with a meal. 04/02/15   Terressa Koyanagi, DO  methocarbamol (ROBAXIN) 500 MG tablet Take 1 tablet (500 mg total) by mouth 2 (two) times daily. 07/08/14   Junius Finner, PA-C  rosuvastatin (CRESTOR) 20 MG tablet Take 1 tablet (20 mg total) by mouth daily. 06/04/15   Terressa Koyanagi, DO   BP 162/106 mmHg  Pulse 70  Temp(Src) 98.3 F (36.8 C) (Oral)  Resp 18  Ht  (1.676 m)  Wt 102.059 kg  BMI 36.33 kg/m2  SpO2 99%   Physical Exam  Constitutional: He is  oriented to person, place, and time. He appears well-developed and well-nourished. No distress.  HENT:  Head: Normocephalic and atraumatic.  Mouth/Throat: Oropharynx is clear and moist.  Eyes: Pupils are equal, round, and reactive to light.  Neck: Normal range of motion. Neck supple.  Cardiovascular: Normal rate, regular rhythm and normal heart sounds.  Exam reveals no gallop and no friction rub.   No murmur heard. Pulmonary/Chest: Effort normal and breath sounds normal. No respiratory distress. He has no wheezes.  Abdominal: Soft. Bowel sounds are normal. He exhibits no distension. There is no tenderness. There is no CVA tenderness.  Neurological: He is alert and oriented to person, place, and time. He exhibits normal muscle tone. Coordination normal.  Skin: Skin is warm and dry. No rash noted. He is not diaphoretic. No erythema.  Psychiatric: He has a normal mood and affect. His behavior is normal.  Nursing note and vitals reviewed.   ED Course  Procedures (including critical care time) Labs Review Labs Reviewed - No data to display  Imaging Review Ct Renal Peddie Study  08/21/2015  CLINICAL DATA:  Sudden onset stabbing left flank pain EXAM: CT ABDOMEN AND PELVIS WITHOUT CONTRAST TECHNIQUE: Multidetector CT imaging of the abdomen and pelvis was performed following the standard protocol without IV contrast. COMPARISON:  05/27/2010 FINDINGS: Lower chest and abdominal wall: Fatty umbilical and periumbilical hernias which are small. There is fatty expansion of the right inguinal canal, partly visualized. There is fat at the apex of the left ventricle suggesting previous subendocardial infarct, stable. Hepatobiliary: Hepatic steatosis with patchy central sparing.No evidence of biliary obstruction or Ki. Pancreas: Unremarkable when accounting for duodenal diverticulum projecting into the head region. Spleen: Unremarkable. Adrenals/Urinary Tract:  Negative adrenals. 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 No right hydronephrosis. 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 Low-density lesion in the upper pole right kidney with stable size since 2011. On the enhanced scan, this has the appearance of cyst. Unremarkable bladder. Reproductive:No pathologic findings. Stomach/Bowel: No obstruction. No appendicitis. Distal colonic diverticulosis, inactive. Vascular/Lymphatic: No acute vascular abnormality. No mass or adenopathy. Peritoneal: No ascites or pneumoperitoneum. Musculoskeletal: L4-5 anterior lumbar interbody fusion with solid bony fusion. There has also been posterior rod and pedicle screw fixation at the same level. 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 IMPRESSION: 1. 2 mm distal left ureteral Nanney with mild hydronephrosis. 2. Chronic findings are stable from 2011 study and described above. Electronically Signed   By:  Marnee Spring M.D.   On: 08/21/2015 10:01   I have personally reviewed and evaluated these images and lab results as part of my medical decision-making.   EKG Interpretation None      Assessment & Plan  L flank pain: Pain management with dilaudid, UA, CT renal Hashimi study   The patient's pain is completely controlled at this time.  He will be referred to urology.  Patient agrees the plan and all questions answered.  I advised him to return here for any worsening in condition  Charlestine Night, PA-C 08/21/15 1031  Arby Barrette, MD 08/29/15 1725

## 2015-08-21 NOTE — ED Notes (Signed)
Patient transported to CT 

## 2015-08-21 NOTE — ED Notes (Signed)
Pt. Was driving his wife to work and developed lt. Flank pain into his lt. Groin area.  Hx of Kidney Volk.  Pt. Voided this am with no visible hematuria Pt. Does have nausea when the pain starts.  Denies any vomiting

## 2015-12-13 ENCOUNTER — Telehealth: Payer: Self-pay | Admitting: Family Medicine

## 2015-12-13 MED ORDER — CITALOPRAM HYDROBROMIDE 40 MG PO TABS: 40.0000 mg | ORAL_TABLET | Freq: Every day | ORAL | Status: DC

## 2015-12-13 MED ORDER — HYDROCHLOROTHIAZIDE 25 MG PO TABS: 25.0000 mg | ORAL_TABLET | Freq: Every day | ORAL | Status: DC

## 2015-12-13 MED ORDER — LISINOPRIL 10 MG PO TABS: 10.0000 mg | ORAL_TABLET | Freq: Every day | ORAL | Status: DC

## 2015-12-13 NOTE — Telephone Encounter (Signed)
Pt request refill  citalopram (CELEXA) 40 MG tablet hydrochlorothiazide (HYDRODIURIL) 25 MG tablet  lisinopril (PRINIVIL,ZESTRIL) 10 MG tablet  Pt has made a follow up appointment next ek, but is out of meds. Would like enough to get through to appointment  [facility]/ [place]

## 2015-12-13 NOTE — Telephone Encounter (Signed)
Rxs done. 

## 2015-12-20 ENCOUNTER — Ambulatory Visit (INDEPENDENT_AMBULATORY_CARE_PROVIDER_SITE_OTHER): Payer: Commercial Managed Care - HMO | Admitting: Family Medicine

## 2015-12-20 ENCOUNTER — Encounter: Payer: Self-pay | Admitting: Family Medicine

## 2015-12-20 VITALS — BP 136/94 | HR 99 | Temp 99.2°F | Ht 66.0 in | Wt 220.1 lb

## 2015-12-20 DIAGNOSIS — E785 Hyperlipidemia, unspecified: Secondary | ICD-10-CM | POA: Diagnosis not present

## 2015-12-20 DIAGNOSIS — F3340 Major depressive disorder, recurrent, in remission, unspecified: Secondary | ICD-10-CM

## 2015-12-20 DIAGNOSIS — I10 Essential (primary) hypertension: Secondary | ICD-10-CM

## 2015-12-20 DIAGNOSIS — E669 Obesity, unspecified: Secondary | ICD-10-CM

## 2015-12-20 DIAGNOSIS — R7303 Prediabetes: Secondary | ICD-10-CM

## 2015-12-20 MED ORDER — HYDROCHLOROTHIAZIDE 25 MG PO TABS: 25.0000 mg | ORAL_TABLET | Freq: Every day | ORAL | Status: DC

## 2015-12-20 MED ORDER — METFORMIN HCL 500 MG PO TABS: 500.0000 mg | ORAL_TABLET | Freq: Two times a day (BID) | ORAL | Status: DC

## 2015-12-20 MED ORDER — LISINOPRIL 10 MG PO TABS: 10.0000 mg | ORAL_TABLET | Freq: Every day | ORAL | Status: DC

## 2015-12-20 MED ORDER — ROSUVASTATIN CALCIUM 20 MG PO TABS: 20.0000 mg | ORAL_TABLET | Freq: Every day | ORAL | Status: DC

## 2015-12-20 MED ORDER — CITALOPRAM HYDROBROMIDE 40 MG PO TABS: 40.0000 mg | ORAL_TABLET | Freq: Every day | ORAL | Status: DC

## 2015-12-20 NOTE — Progress Notes (Signed)
HPI:  HTN/Prediabetes/Obesity: -meds: HCTZ (he ran out of this about 3 or 4 days ago), lisinopril, metformin -advised lifestyle recs, he is working on this and reports he has changed his diet significantly -started metformin last visit - reports tolerating ok  HLD/Elevated triglycerides/Obesity: -meds: statin - changed to crestor last visit -we have discussed wt loss options including meds and surgery and lifestyle recs in the past -concern for psych side effects with most of the meds for wt loss - he did opt to try orlistat in the past but was too expensive - may consider doing short term -has been doing better with diet, not doing regular exercise  hx depression:  -on several medications in the past and did not tolerate per my review of his report -meds: celexa -reports: stable, doing great now -was advised to see psych/get counseling in the past -denies SI, thoughts of self harm  Chronic back pain:  -followed by PMR (Dr. 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, trochanteric bursitis, steroid injections  migraines - followed by neurology, reports had sleep study  Seasonal allergies/mild intermittent asthma: -uses flonase and alb prn -denies SOB, wheezing, frequent asthma symptoms    ROS: See pertinent positives and negatives per HPI.  Past Medical History  Diagnosis Date  . Back pain   . Hypertension   . Asthma   . Depression   . Cluster headache   . Stroke [facility])     pt states "mini-Stroke"  . Arthritis   . Blood in stool   . Headache(784.0)     frequent   . Allergy   . Hyperlipidemia   . Migraines   . UTI (urinary tract infection)   . Lumbosacral spondylosis without myelopathy 06/15/2013  . Postlaminectomy syndrome, lumbar region 03/08/2015    Status post L4-L5 decompression and fusion, anterior/posterior in 2011     Past Surgical History  Procedure Laterality Date  . Back surgery    .  Lumbar laminectomy/decompression microdiscectomy  2010  . Anterior and posterior spinal fusion  2011    Family History  Problem Relation Age of Onset  . Heart disease Mother   . Hypertension Mother   . Arthritis Father   . Hypertension Father   . Prostate cancer Father   . Miscarriages / Stillbirths Maternal Grandmother   . Heart disease Maternal Grandfather   . Sudden death Maternal Grandfather   . Breast cancer Sister   . Migraines Sister     Social History   Social History  . Marital Status: Married    Spouse Name: [name]  . Number of Children: 1  . Years of Education: 12th   Occupational History  .      n/a   Social History Main Topics  . Smoking status: Never Smoker   . Smokeless tobacco: Never Used  . Alcohol Use: Yes     Comment: once a month  . Drug Use: No  . Sexual Activity: Yes    Birth Control/ Protection: None   Other Topics Concern  . None   Social History Narrative   Patient lives at home with his family.   Caffeine Use: 2 cups daily     Current outpatient prescriptions:  .  acetaminophen (TYLENOL) 500 MG tablet, Take 1,500 mg by mouth every 4 (four) hours. , Disp: , Rfl:  .  citalopram (CELEXA) 40 MG tablet, Take 1 tablet (40 mg total) by mouth daily., Disp: 90 tablet, Rfl: 3 .  hydrochlorothiazide (HYDRODIURIL) 25 MG tablet, Take 1 tablet (25 mg total) by mouth daily., Disp: 30 tablet, Rfl: 0 .  ibuprofen (ADVIL,MOTRIN) 200 MG tablet, Take 800 mg by mouth every 6 (six) hours as needed (pain). , Disp: , Rfl:  .  lisinopril (PRINIVIL,ZESTRIL) 10 MG tablet, Take 1 tablet (10 mg total) by mouth daily., Disp: 90 tablet, Rfl: 3 .  metFORMIN (GLUCOPHAGE) 500 MG tablet, Take 1 tablet (500 mg total) by mouth 2 (two) times daily with a meal., Disp: 180 tablet, Rfl: 3 .  rosuvastatin (CRESTOR) 20 MG tablet, Take 1 tablet (20 mg total) by mouth daily., Disp: 90 tablet, Rfl: 3  EXAM:  Filed Vitals:   12/20/15 0847  BP: 136/94  Pulse: 99  Temp: 99.2  F (37.3 C)    Body mass index is 35.54 kg/(m^2).  GENERAL: vitals reviewed and listed above, alert, oriented, appears well hydrated and in no acute distress  HEENT: atraumatic, conjunttiva clear, no obvious abnormalities on inspection of external nose and ears  NECK: no obvious masses on inspection  LUNGS: clear to auscultation bilaterally, no wheezes, rales or rhonchi, good air movement  CV: HRRR, no peripheral edema  MS: moves all extremities without noticeable abnormality  PSYCH: pleasant and cooperative, no obvious depression or anxiety  ASSESSMENT AND PLAN:  Discussed the following assessment and plan:  Essential hypertension -He ran out of his hydrochlorothiazide -refill sent and advised to restart -Recheck blood pressure in 2 weeks  Hyperlipemia - Plan: Lipid Panel -Check fasting lipids, he is not fasting today and will schedule this at his blood pressure rechecked  Prediabetes - Plan: metFORMIN (GLUCOPHAGE) 500 MG tablet -Continue current treatment  Obesity -Supported and cried congratulated on lifestyle changes -Encouraged regular aerobic exercise and healthy diet  Major depressive disorder, recurrent, in remission (HCC) -Stable continue current treatment  -Patient advised to return or notify a doctor immediately if symptoms worsen or persist or new concerns arise.  Patient Instructions  Before you leave: -Schedule a nurse visit to recheck blood pressure in 2 weeks -please take your blood pressure medications before coming and drink plenty of water  -Please schedule a fasting lab visit the same day as your nurse visit to recheck your cholesterol - please drink plenty of water and take your blood pressure medications before coming -Schedule follow up with Dr. Selena BattenKim in about 3-4 months  Please go get you hydrochlorothiazide and restart this medication today along with all of your other medications.  We recommend the following healthy lifestyle measures: - eat  a healthy whole foods diet consisting of regular small meals composed of vegetables, fruits, beans, nuts, seeds, healthy meats such as white chicken and fish and whole grains.  - avoid sweets, white starchy foods, fried foods, fast food, processed foods, sodas, red meet and other fattening foods.  - get a least 150-300 minutes of aerobic exercise per week.       Kriste BasqueKIM, Carlyann Placide R.

## 2015-12-20 NOTE — Patient Instructions (Signed)
Before you leave: -Schedule a nurse visit to recheck blood pressure in 2 weeks -please take your blood pressure medications before coming and drink plenty of water  -Please schedule a fasting lab visit the same day as your nurse visit to recheck your cholesterol - please drink plenty of water and take your blood pressure medications before coming -Schedule follow up with Dr. Selena BattenKim in about 3-4 months  Please go get you hydrochlorothiazide and restart this medication today along with all of your other medications.  We recommend the following healthy lifestyle measures: - eat a healthy whole foods diet consisting of regular small meals composed of vegetables, fruits, beans, nuts, seeds, healthy meats such as white chicken and fish and whole grains.  - avoid sweets, white starchy foods, fried foods, fast food, processed foods, sodas, red meet and other fattening foods.  - get a least 150-300 minutes of aerobic exercise per week.

## 2015-12-20 NOTE — Progress Notes (Signed)
Pre visit review using our clinic review tool, if applicable. No additional management support is needed unless otherwise documented below in the visit note. 

## 2016-01-03 ENCOUNTER — Other Ambulatory Visit: Payer: Self-pay | Admitting: Family Medicine

## 2016-01-03 ENCOUNTER — Encounter: Payer: Self-pay | Admitting: *Deleted

## 2016-01-03 ENCOUNTER — Ambulatory Visit (INDEPENDENT_AMBULATORY_CARE_PROVIDER_SITE_OTHER): Payer: Commercial Managed Care - HMO | Admitting: *Deleted

## 2016-01-03 ENCOUNTER — Other Ambulatory Visit (INDEPENDENT_AMBULATORY_CARE_PROVIDER_SITE_OTHER): Payer: Commercial Managed Care - HMO

## 2016-01-03 VITALS — BP 130/90 | HR 90

## 2016-01-03 DIAGNOSIS — I10 Essential (primary) hypertension: Secondary | ICD-10-CM

## 2016-01-03 DIAGNOSIS — E785 Hyperlipidemia, unspecified: Secondary | ICD-10-CM

## 2016-01-03 LAB — LIPID PANEL
CHOL/HDL RATIO: 5
Cholesterol: 202 mg/dL — ABNORMAL HIGH (ref 0–200)
HDL: 38.4 mg/dL — AB (ref >39.00)
LDL CALC: 141 mg/dL — AB (ref 0–99)
NONHDL: 163.1
Triglycerides: 113 mg/dL (ref 0.0–149.0)
VLDL: 22.6 mg/dL (ref 0.0–40.0)

## 2016-01-03 MED ORDER — ROSUVASTATIN CALCIUM 40 MG PO TABS: 40.0000 mg | ORAL_TABLET | Freq: Every day | ORAL | Status: DC

## 2016-01-06 ENCOUNTER — Ambulatory Visit (INDEPENDENT_AMBULATORY_CARE_PROVIDER_SITE_OTHER): Payer: Commercial Managed Care - HMO | Admitting: Family Medicine

## 2016-01-06 DIAGNOSIS — R69 Illness, unspecified: Secondary | ICD-10-CM

## 2016-01-06 NOTE — Progress Notes (Signed)
Late cancel

## 2016-01-13 ENCOUNTER — Ambulatory Visit (INDEPENDENT_AMBULATORY_CARE_PROVIDER_SITE_OTHER): Payer: Commercial Managed Care - HMO | Admitting: Family Medicine

## 2016-01-13 ENCOUNTER — Encounter: Payer: Self-pay | Admitting: Family Medicine

## 2016-01-13 VITALS — BP 140/102 | HR 95 | Temp 98.6°F | Ht 66.0 in | Wt 221.2 lb

## 2016-01-13 DIAGNOSIS — M25561 Pain in right knee: Secondary | ICD-10-CM | POA: Diagnosis not present

## 2016-01-13 DIAGNOSIS — F3340 Major depressive disorder, recurrent, in remission, unspecified: Secondary | ICD-10-CM | POA: Diagnosis not present

## 2016-01-13 DIAGNOSIS — I10 Essential (primary) hypertension: Secondary | ICD-10-CM | POA: Diagnosis not present

## 2016-01-13 MED ORDER — LISINOPRIL 20 MG PO TABS: 20.0000 mg | ORAL_TABLET | Freq: Every day | ORAL | Status: DC

## 2016-01-13 NOTE — Progress Notes (Signed)
Pre visit review using our clinic review tool, if applicable. No additional management support is needed unless otherwise documented below in the visit note. 

## 2016-01-13 NOTE — Progress Notes (Signed)
HPI:  Follow up elevated Blood Pressure: -meds: hctz 25, lisinopril 10 -denies: chest pain, shortness of breath, headaches, swelling -Reports takes medication daily -Reports is working on a healthier diet and has been exercising and has lost weight  R knee pain: -Started about 1 month ago -Pain is in the quadricep tendons of the right knee -Has recently started aggressive exercise program at the gym, doing incline treadmill -Denies weakness, numbness, radiation, giving away, falls, malaise or swelling  Depression -Has been on Celexa for a long time - if tries to stop this depression worsens -He has noticed that he is irritable frequently and wants to address this -Denies depressed mood, poor sleep, suicidal ideation, psychosis or manic symptoms  ROS: See pertinent positives and negatives per HPI.  Past Medical History  Diagnosis Date  . Back pain   . Hypertension   . Asthma   . Depression   . Cluster headache   . Stroke [facility])     pt states "mini-Stroke"  . Arthritis   . Blood in stool   . Headache(784.0)     frequent   . Allergy   . Hyperlipidemia   . Migraines   . UTI (urinary tract infection)   . Lumbosacral spondylosis without myelopathy 06/15/2013  . Postlaminectomy syndrome, lumbar region 03/08/2015    Status post L4-L5 decompression and fusion, anterior/posterior in 2011     Past Surgical History  Procedure Laterality Date  . Back surgery    . Lumbar laminectomy/decompression microdiscectomy  2010  . Anterior and posterior spinal fusion  2011    Family History  Problem Relation Age of Onset  . Heart disease Mother   . Hypertension Mother   . Arthritis Father   . Hypertension Father   . Prostate cancer Father   . Miscarriages / Stillbirths Maternal Grandmother   . Heart disease Maternal Grandfather   . Sudden death Maternal Grandfather   . Breast cancer Sister   . Migraines Sister     Social History   Social History  . Marital Status: Married    Spouse Name: [name]  . Number of Children: 1  . Years of Education: 12th   Occupational History  .      n/a   Social History Main Topics  . Smoking status: Never Smoker   . Smokeless tobacco: Never Used  . Alcohol Use: Yes     Comment: once a month  . Drug Use: No  . Sexual Activity: Yes    Birth Control/ Protection: None   Other Topics Concern  . None   Social History Narrative   Patient lives at home with his family.   Caffeine Use: 2 cups daily     Current outpatient prescriptions:  .  acetaminophen (TYLENOL) 500 MG tablet, Take 1,500 mg by mouth every 4 (four) hours. , Disp: , Rfl:  .  citalopram (CELEXA) 40 MG tablet, Take 1 tablet (40 mg total) by mouth daily., Disp: 90 tablet, Rfl: 3 .  hydrochlorothiazide (HYDRODIURIL) 25 MG tablet, Take 1 tablet (25 mg total) by mouth daily., Disp: 30 tablet, Rfl: 0 .  ibuprofen (ADVIL,MOTRIN) 200 MG tablet, Take 800 mg by mouth every 6 (six) hours as needed (pain). , Disp: , Rfl:  .  lisinopril (PRINIVIL,ZESTRIL) 20 MG tablet, Take 1 tablet (20 mg total) by mouth daily., Disp: 90 tablet, Rfl: 3 .  metFORMIN (GLUCOPHAGE) 500 MG tablet, Take 1 tablet (500 mg total) by mouth 2 (two) times daily with a meal., Disp:  180 tablet, Rfl: 3 .  rosuvastatin (CRESTOR) 40 MG tablet, Take 1 tablet (40 mg total) by mouth daily., Disp: 90 tablet, Rfl: 3  EXAM:  Filed Vitals:   01/13/16 0932 01/13/16 0936  BP: 132/92 140/102  Pulse: 95   Temp: 98.6 F (37 C)     Body mass index is 35.72 kg/(m^2).  GENERAL: vitals reviewed and listed above, alert, oriented, appears well hydrated and in no acute distress  HEENT: atraumatic, conjunttiva clear, no obvious abnormalities on inspection of external nose and ears  NECK: no obvious masses on inspection  LUNGS: clear to auscultation bilaterally, no wheezes, rales or rhonchi, good air movement  CV: HRRR, no peripheral edema  MS: moves all extremities without noticeable abnormality, normal  inspection of both knees, patellar crepitus bilaterally, mild cases, right, tenderness to palpation in the quadriceps tendons, no joint line tenderness, negative Lachman, negative valgus for stress, normal gait  PSYCH: pleasant and cooperative, no obvious depression or anxiety  ASSESSMENT AND PLAN:  Discussed the following assessment and plan:  Essential hypertension -Discussed options, will increase lisinopril -Congratulated on lifestyle changes and encouraged him to continue -monitor at home and bring cuff and locked next appointment, call in interim if running high  Major depressive disorder -Opted to add CBT  -Number provided to call, he agreed to do this  -Return precautions   Right knee pain -Suspect quadriceps strength secondary to new activity -Opted for home exercise program, ice, conservative management of pain and modification of activity while healing, some PFS  -CPE 3 months, labs then -Patient advised to return or notify a doctor immediately if symptoms worsen or persist or new concerns arise.  Patient Instructions  Before you leave: -HEP for patellofem syndrome -Schedule a physical in 3 months; if possible, please come fasting and we will plan to do labs that day - if you cannot come fasting and we will order labs to be done at a later date.  Please increase the lisinopril to 20 mg once daily. I sent a new dose of 20 mg your pharmacy.  Please lwoer the incline on the treadmill and do the exercises for the knee 3-4 days per week. Please apply ice to the knee after exercise. Please follow-up if your knee pain persist.  Please purchase an automatic blood pressure cuff and monitor your blood pressure 3 times per week. Please sit for 5 minutes prior to checking and rest arm on the table while checking. Please call our office if blood pressure is running over 150 on the top or over 90 on the bottom before your next appointment.  We recommend the following healthy  lifestyle measures: - eat a healthy whole foods diet consisting of regular small meals composed of vegetables, fruits, beans, nuts, seeds, healthy meats such as white chicken and fish and whole grains.  - avoid sweets, white starchy foods, fried foods, fast food, processed foods, sodas, red meet and other fattening foods.  - get a least 150-300 minutes of aerobic exercise per week.   Please call the number provided to set up cognitive behavioral therapy to help with irritability. Please follow up with us immediately if you have worsening depression or other concerns.     Kriste BasqueKIM, Lasaro Primm R.

## 2016-01-13 NOTE — Patient Instructions (Addendum)
Before you leave: -HEP for patellofem syndrome -Schedule a physical in 3 months; if possible, please come fasting and we will plan to do labs that day - if you cannot come fasting and we will order labs to be done at a later date.  Please increase the lisinopril to 20 mg once daily. I sent a new dose of 20 mg your pharmacy.  Please lwoer the incline on the treadmill and do the exercises for the knee 3-4 days per week. Please apply ice to the knee after exercise. Please follow-up if your knee pain persist.  Please purchase an automatic blood pressure cuff and monitor your blood pressure 3 times per week. Please sit for 5 minutes prior to checking and rest arm on the table while checking. Please call our office if blood pressure is running over 150 on the top or over 90 on the bottom before your next appointment.  We recommend the following healthy lifestyle measures: - eat a healthy whole foods diet consisting of regular small meals composed of vegetables, fruits, beans, nuts, seeds, healthy meats such as white chicken and fish and whole grains.  - avoid sweets, white starchy foods, fried foods, fast food, processed foods, sodas, red meet and other fattening foods.  - get a least 150-300 minutes of aerobic exercise per week.   Please call the number provided to set up cognitive behavioral therapy to help with irritability. Please follow up with us immediately if you have worsening depression or other concerns.

## 2016-01-21 ENCOUNTER — Ambulatory Visit (HOSPITAL_BASED_OUTPATIENT_CLINIC_OR_DEPARTMENT_OTHER): Payer: Commercial Managed Care - HMO | Admitting: Physical Medicine & Rehabilitation

## 2016-01-21 ENCOUNTER — Encounter: Payer: Commercial Managed Care - HMO | Attending: Physical Medicine & Rehabilitation

## 2016-01-21 ENCOUNTER — Encounter: Payer: Self-pay | Admitting: Physical Medicine & Rehabilitation

## 2016-01-21 VITALS — BP 131/89 | HR 81 | Resp 14

## 2016-01-21 DIAGNOSIS — E669 Obesity, unspecified: Secondary | ICD-10-CM | POA: Insufficient documentation

## 2016-01-21 DIAGNOSIS — M961 Postlaminectomy syndrome, not elsewhere classified: Secondary | ICD-10-CM | POA: Diagnosis not present

## 2016-01-21 DIAGNOSIS — I10 Essential (primary) hypertension: Secondary | ICD-10-CM | POA: Insufficient documentation

## 2016-01-21 DIAGNOSIS — M5416 Radiculopathy, lumbar region: Secondary | ICD-10-CM | POA: Diagnosis not present

## 2016-01-21 DIAGNOSIS — E785 Hyperlipidemia, unspecified: Secondary | ICD-10-CM | POA: Diagnosis not present

## 2016-01-21 DIAGNOSIS — Z981 Arthrodesis status: Secondary | ICD-10-CM | POA: Diagnosis not present

## 2016-01-21 DIAGNOSIS — M545 Low back pain: Secondary | ICD-10-CM | POA: Diagnosis not present

## 2016-01-21 DIAGNOSIS — G8929 Other chronic pain: Secondary | ICD-10-CM | POA: Diagnosis not present

## 2016-01-21 DIAGNOSIS — M533 Sacrococcygeal disorders, not elsewhere classified: Secondary | ICD-10-CM | POA: Diagnosis not present

## 2016-01-21 DIAGNOSIS — M5417 Radiculopathy, lumbosacral region: Secondary | ICD-10-CM

## 2016-01-21 DIAGNOSIS — F3340 Major depressive disorder, recurrent, in remission, unspecified: Secondary | ICD-10-CM | POA: Diagnosis not present

## 2016-01-21 MED ORDER — GABAPENTIN 300 MG PO CAPS: 300.0000 mg | ORAL_CAPSULE | Freq: Two times a day (BID) | ORAL | Status: DC

## 2016-01-21 MED ORDER — MELOXICAM 15 MG PO TABS: 15.0000 mg | ORAL_TABLET | Freq: Every day | ORAL | Status: DC

## 2016-01-21 NOTE — Patient Instructions (Signed)
I suspect the burning pain is related to Sacroiliac pain Schedule  Bilateral sacroiliac injections Will substitute Mobic for the Ibuprofen  You also have a chronic left S1 radiculopathy Will start gabapentin 300 mg twice a day If it does not improve we'll need to do left S1 injection.

## 2016-01-21 NOTE — Progress Notes (Signed)
Subjective:    Patient ID: Gary DrumJerry D Paulding, male    DOB: Dec 01, 1970, 45 y.o.   MRN: [id number] Work related injury in sept 2010.  Hurt back and underwent L4-5 decompressive laminectomy in Dec 2010, numbness in left leg worsened and went on to lumbar fusion July 2011 L4-5 Anterior and Posterior.Left leg numbness improved but now over the last 6-8 months increasing with pain once again.  Seen by neurosurgery Jan 2013, second opinion no further surgery recommended Repeat CT myelogram in 2012 demonstrated dynamic retrolisthesis at L3-4, exacerbated by extension.  HPI Patient has had increasing pain Low back and buttocks area bilaterally. This is increased over the last couple months. In the past he has had relief with sacroiliac injection. His last injection was 04/09/2015 on the left side. His pain is predominantly on left side but also involving the right side as well.  In addition patient has pain going down to his foot. He has had No benefit with left L5 transforaminal epidural injection In the past.  His pain is worse with walking.No weakness noted in the lower extremities. He is independent with all his self-care and mobility without assistive device No bowel or bladder dysfunction Pain Inventory Average Pain 5 Pain Right Now 5 My pain is constant, burning, stabbing and tingling  In the last 24 hours, has pain interfered with the following? General activity 4 Relation with others 4 Enjoyment of life 4 What TIME of day is your pain at its worst? daytime Sleep (in general) Fair  Pain is worse with: standing and some activites Pain improves with: pacing activities Relief from Meds: 2  Mobility walk without assistance how many minutes can you walk? 15  ability to climb steps?  yes do you drive?  yes Do you have any goals in this area?  yes  Function disabled: date disabled .  Neuro/Psych numbness tingling depression anxiety  Prior Studies Any changes since last visit?   no  Physicians involved in your care Any changes since last visit?  no   Family History  Problem Relation Age of Onset  . Heart disease Mother   . Hypertension Mother   . Arthritis Father   . Hypertension Father   . Prostate cancer Father   . Miscarriages / Stillbirths Maternal Grandmother   . Heart disease Maternal Grandfather   . Sudden death Maternal Grandfather   . Breast cancer Sister   . Migraines Sister    Social History   Social History  . Marital Status: Married    Spouse Name: [name]  . Number of Children: 1  . Years of Education: 12th   Occupational History  .      n/a   Social History Main Topics  . Smoking status: Never Smoker   . Smokeless tobacco: Never Used  . Alcohol Use: Yes     Comment: once a month  . Drug Use: No  . Sexual Activity: Yes    Birth Control/ Protection: None   Other Topics Concern  . None   Social History Narrative   Patient lives at home with his family.   Caffeine Use: 2 cups daily   Past Surgical History  Procedure Laterality Date  . Back surgery    . Lumbar laminectomy/decompression microdiscectomy  2010  . Anterior and posterior spinal fusion  2011   Past Medical History  Diagnosis Date  . Back pain   . Hypertension   . Asthma   . Depression   . Cluster headache   .  Stroke [facility])     pt states "mini-Stroke"  . Arthritis   . Blood in stool   . Headache(784.0)     frequent   . Allergy   . Hyperlipidemia   . Migraines   . UTI (urinary tract infection)   . Lumbosacral spondylosis without myelopathy 06/15/2013  . Postlaminectomy syndrome, lumbar region 03/08/2015    Status post L4-L5 decompression and fusion, anterior/posterior in 2011    BP 131/89 mmHg  Pulse 81  Resp 14  SpO2 98%  Opioid Risk Score:   Fall Risk Score:  `1  Depression screen PHQ 2/9  Depression screen [facility] 2/9 06/10/2015 04/09/2015  Decreased Interest 2 0  Down, Depressed, Hopeless 1 0  PHQ - 2 Score 3 0  Altered sleeping 2 -   Tired, decreased energy 2 -  Change in appetite 2 -  Feeling bad or failure about yourself  1 -  Trouble concentrating 2 -  Moving slowly or fidgety/restless 0 -  Suicidal thoughts 0 -  PHQ-9 Score 12 -  Difficult doing work/chores Somewhat difficult -     Review of Systems  All other systems reviewed and are negative.      Objective:   Physical Exam  Constitutional: He is oriented to person, place, and time. He appears well-developed and well-nourished.  HENT:  Head: Normocephalic and atraumatic.  Eyes: Conjunctivae and EOM are normal. Pupils are equal, round, and reactive to light.  Neck: Normal range of motion.  Neurological: He is alert and oriented to person, place, and time. Gait abnormal.  Reflex Scores:      Patellar reflexes are 0 on the right side and 0 on the left side.      Achilles reflexes are 0 on the right side and 0 on the left side. Motor strength is 5/5 bilateral hip flexor knee extensor and ankle dorsiflexor. Negative straight leg raising There is groin pain with Faber's bilaterally There is tenderness over the PSIS bilaterally Hip joint range of motion is good is no tenderness over the greater trochanters bilaterally. Poor relaxation during reflex testing  Psychiatric: He has a normal mood and affect.  Nursing note and vitals reviewed.         Assessment & Plan:   1. Low back pain with left lower symmetry radicular pain. This is a complex situation. He has a history of L4-5 anterior and posterior fusion. He has documented retrolisthesis L4 on L5 which could potentially be causing some of his radicular discomfort. He also has a history of sacroiliac pain  Which is more common in the post laminectomy and fusion patient  His primary concern is his low back and buttocks pain which includes burning of the posterior thighs. Would recommend sacroiliac injections, diagnostic/therapeutic.  If the above is not helpful would consider translaminar epidural  steroid injection above the level of the fusion.  If the patient does not benefit from the above interventions he may need further imaging studies.  We discussed medication management options. Taking large amounts of ibuprofen, we'll recommend discontinuation with trial of Meloxicam 15 mg per day  For his radicular component will trial gabapentin 300 mg twice a day, if too sedating we'll use just at night

## 2016-02-04 ENCOUNTER — Ambulatory Visit (HOSPITAL_BASED_OUTPATIENT_CLINIC_OR_DEPARTMENT_OTHER): Payer: Commercial Managed Care - HMO | Admitting: Physical Medicine & Rehabilitation

## 2016-02-04 ENCOUNTER — Encounter: Payer: Self-pay | Admitting: Physical Medicine & Rehabilitation

## 2016-02-04 VITALS — BP 133/75 | HR 88 | Resp 14

## 2016-02-04 DIAGNOSIS — I10 Essential (primary) hypertension: Secondary | ICD-10-CM | POA: Diagnosis not present

## 2016-02-04 DIAGNOSIS — G8929 Other chronic pain: Secondary | ICD-10-CM | POA: Diagnosis not present

## 2016-02-04 DIAGNOSIS — M5416 Radiculopathy, lumbar region: Secondary | ICD-10-CM | POA: Diagnosis not present

## 2016-02-04 DIAGNOSIS — M533 Sacrococcygeal disorders, not elsewhere classified: Secondary | ICD-10-CM | POA: Insufficient documentation

## 2016-02-04 DIAGNOSIS — E785 Hyperlipidemia, unspecified: Secondary | ICD-10-CM | POA: Diagnosis not present

## 2016-02-04 DIAGNOSIS — Z981 Arthrodesis status: Secondary | ICD-10-CM | POA: Diagnosis not present

## 2016-02-04 DIAGNOSIS — M545 Low back pain: Secondary | ICD-10-CM | POA: Diagnosis not present

## 2016-02-04 DIAGNOSIS — E669 Obesity, unspecified: Secondary | ICD-10-CM | POA: Diagnosis not present

## 2016-02-04 DIAGNOSIS — M961 Postlaminectomy syndrome, not elsewhere classified: Secondary | ICD-10-CM | POA: Diagnosis not present

## 2016-02-04 NOTE — Progress Notes (Signed)
  PROCEDURE RECORD [facility]   Name: Gary Clark DOB:1971-06-12 MRN: [id number]  Date:02/04/2016  Physician: Claudette LawsAndrew Kirsteins, MD    Nurse/CMA: Rajni Holsworth, CMA  Allergies: No Known Allergies  Consent Signed: Yes.    Is patient diabetic? No.  CBG today? N/A  Pregnant: No. LMP: No LMP for male patient. (age 46-55)  Anticoagulants: no Anti-inflammatory: no Antibiotics: no  Procedure: bilateral sacroiliac steroid injection  Position: Prone Start Time: 9:52am  End Time: 9:58am  Fluoro Time: 17  RN/CMA Jaquane Boughner, CMA Stevin Bielinski, CMA    Time 9:40am 10:02am    BP 133/75 146/85    Pulse 88 89    Respirations 14 14    O2 Sat 96 97    S/S 6 6    Pain Level 4/10 2/10     D/C home with wife, patient A & O X 3, D/C instructions reviewed, and sits independently.

## 2016-02-04 NOTE — Progress Notes (Signed)

## 2016-02-04 NOTE — Patient Instructions (Signed)
Sacroiliac injection was performed today. A combination of a naming medicine plus a cortisone medicine was injected. The injection was done under x-ray guidance. This procedure has been performed to help reduce low back and buttocks pain as well as potentially hip pain. The duration of this injection is variable lasting from hours to  Months. It may repeated if needed. 

## 2016-03-03 ENCOUNTER — Ambulatory Visit: Payer: Commercial Managed Care - HMO | Admitting: Physical Medicine & Rehabilitation

## 2016-03-10 IMAGING — CT CT RENAL STONE PROTOCOL
2 of 4 series · 9 of 46 positions shown, 10 images · non-contrast
Comparison: 05/27/2010

CLINICAL DATA: Sudden onset stabbing left flank pain

EXAM:
CT ABDOMEN AND PELVIS WITHOUT CONTRAST
TECHNIQUE: Multidetector CT imaging of the abdomen and pelvis was performed
following the standard protocol without IV contrast.

[Series 201: routine, idose (2) · axial · 0.73mm/px · z∈[+130,+535]mm · 6 of 99 slices shown, 7 images]
[im 9/99  soft-tissue]
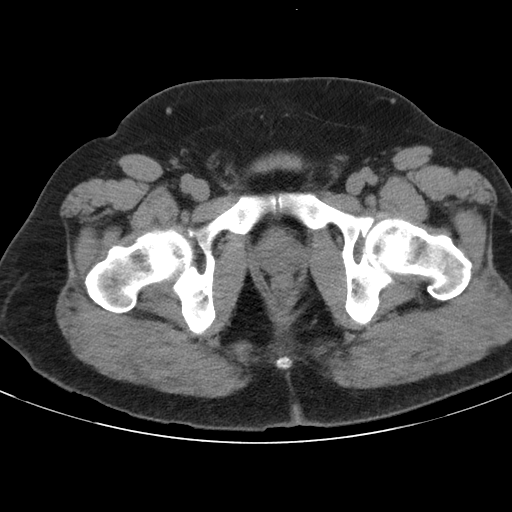
[im 9/99  bone]
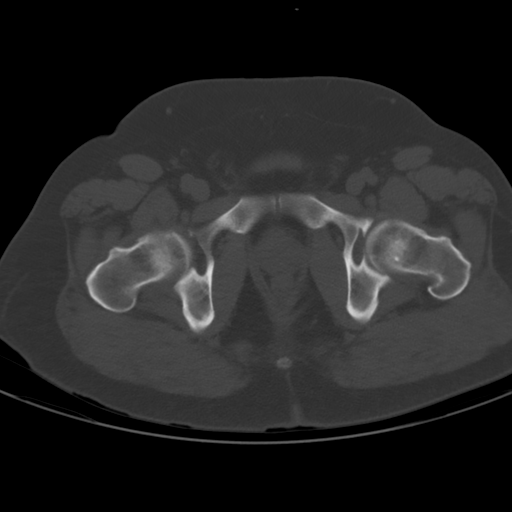
[im 25/99  soft-tissue]
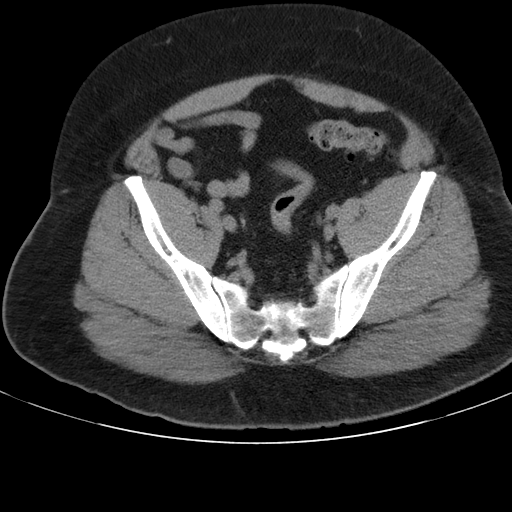
[im 41/99  soft-tissue]
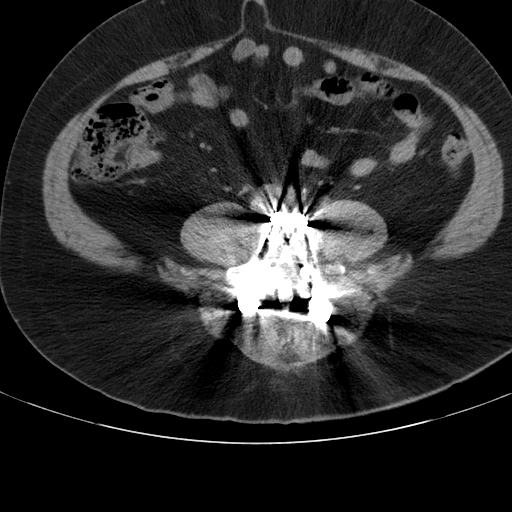
[im 58/99  soft-tissue]
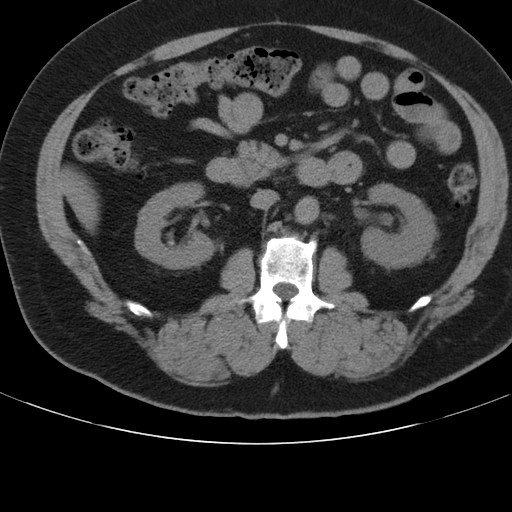
[im 74/99  soft-tissue]
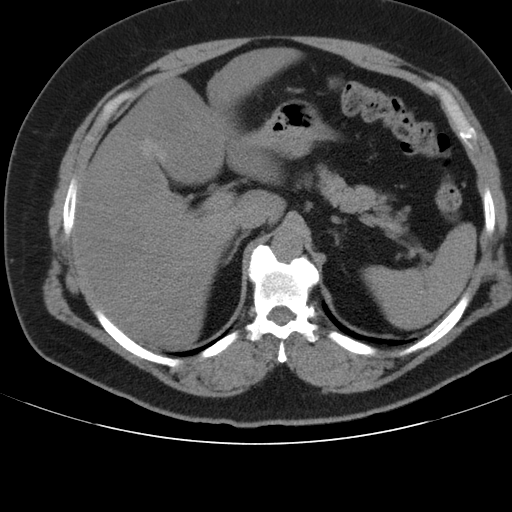
[im 90/99  soft-tissue]
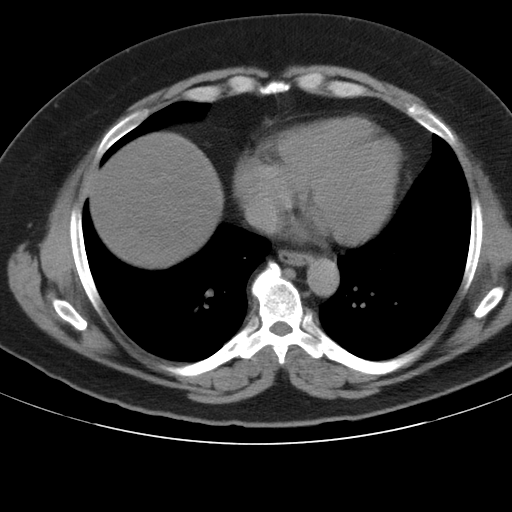

[Series 203: coronals, idose (2) · coronal · 0.45mm/px · 3 of 145 slices shown]
[im 49/145  soft-tissue]
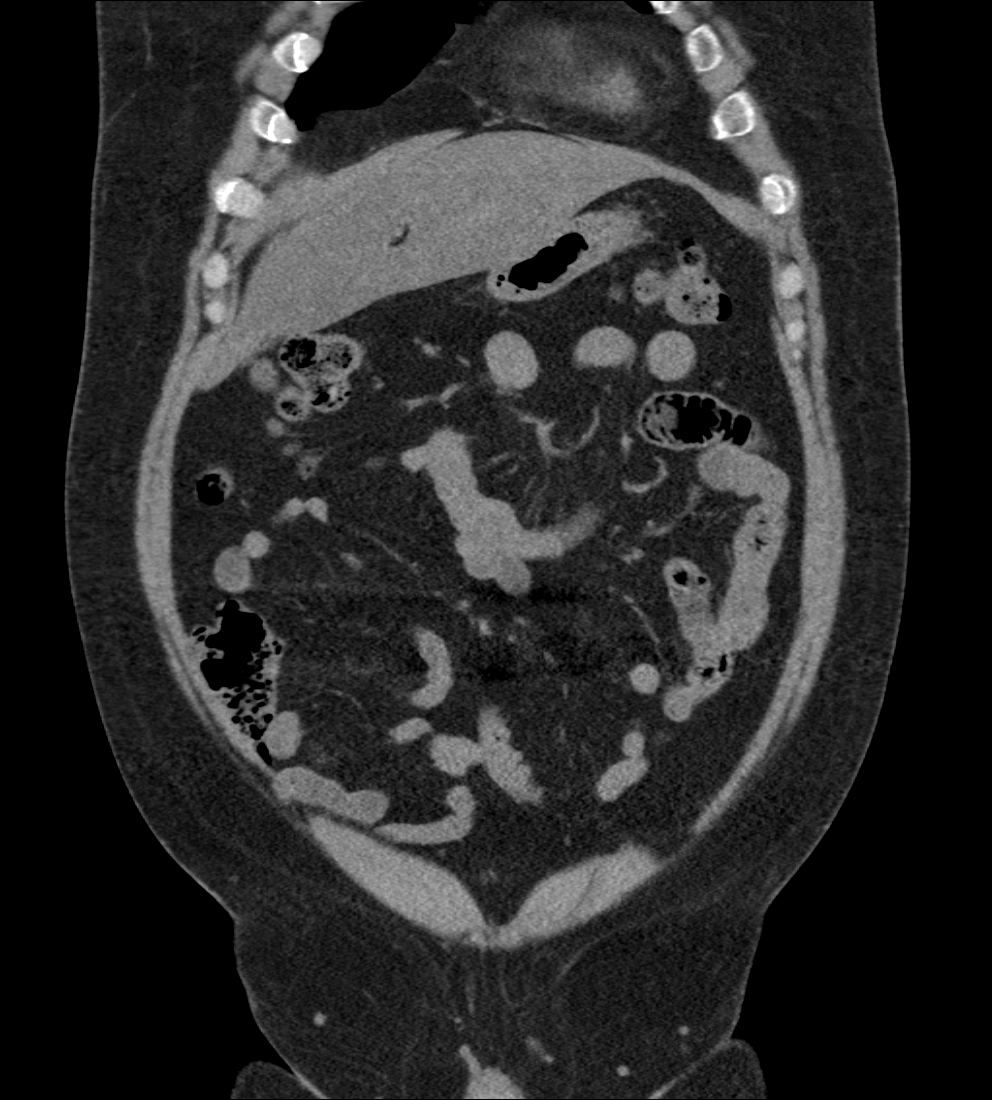
[im 65/145  soft-tissue]
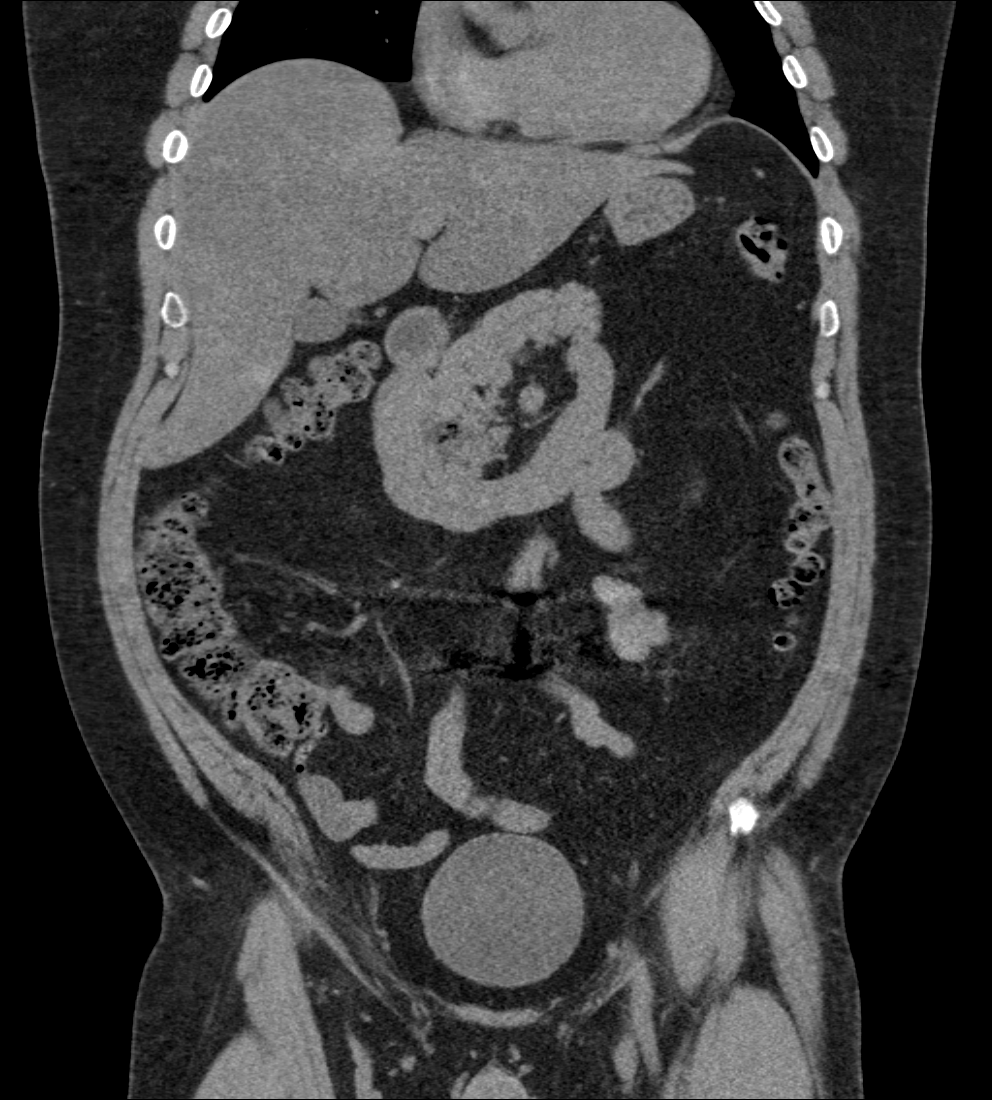
[im 81/145  soft-tissue]
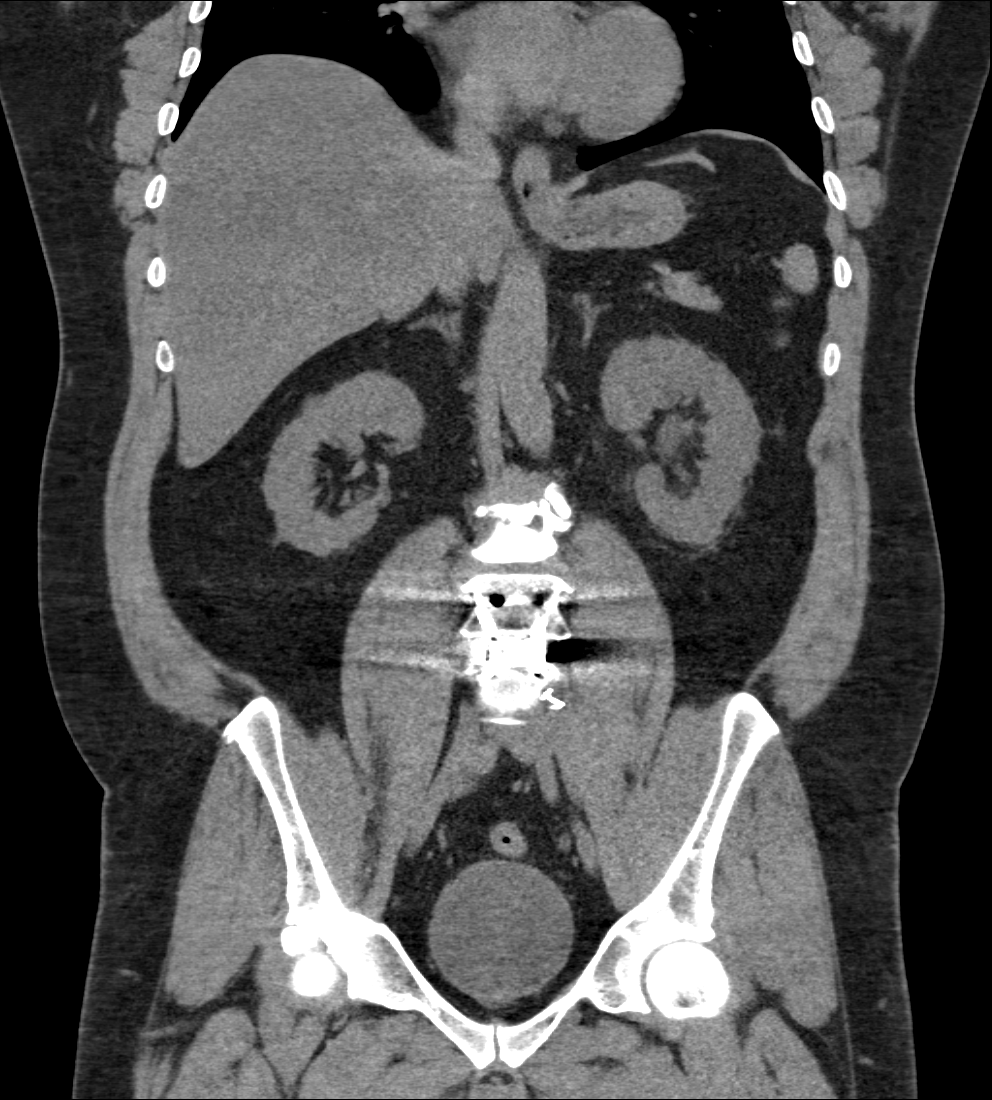

[9 of 46 positions shown; findings below may reference images not displayed]

FINDINGS: Lower chest and abdominal wall: Fatty umbilical and periumbilical
hernias which are small. There is fatty expansion of the right
inguinal canal, partly visualized.

There is fat at the apex of the left ventricle suggesting previous
subendocardial infarct, stable.

Hepatobiliary: Hepatic steatosis with patchy central sparing.No
evidence of biliary obstruction or stone.

Pancreas: Unremarkable when accounting for duodenal diverticulum
projecting into the head region.

Spleen: Unremarkable.

Adrenals/Urinary Tract:  Negative adrenals.

Mild left hydronephrosis and hydroureter secondary to a 2 mm stone
in the distal left ureter, within 2 cm of the UVJ.

No right hydronephrosis. There is at least partial duplication of
the right urinary collecting system.

Low-density lesion in the upper pole right kidney with stable size
since 2055. On the enhanced scan, this has the appearance of cyst.

Unremarkable bladder.

Reproductive:No pathologic findings.

Stomach/Bowel: No obstruction. No appendicitis. Distal colonic
diverticulosis, inactive.

Vascular/Lymphatic: No acute vascular abnormality. No mass or
adenopathy.

Peritoneal: No ascites or pneumoperitoneum.

Musculoskeletal: L4-5 anterior lumbar interbody fusion with solid
bony fusion. There has also been posterior rod and pedicle screw
fixation at the same level. Adjacent segment disease at L3-4 with
retrolisthesis and disc narrowing, evaluated by MRI in 0168.
IMPRESSION: 1. 2 mm distal left ureteral stone with mild hydronephrosis.
2. Chronic findings are stable from 2055 study and described above.

## 2016-04-13 NOTE — Progress Notes (Signed)
HPI:  HTN/Hyperglycemia - prediabetes/Obesity: -meds: HCTZ 25, lisinopril 20mg , metformin -advised lifestyle recs, he is working on this and reports he has changed his diet significantly  R knee pain: -HEP provided last visit -reports seeing PMR in a few days as feels is coming from his back  HLD/Elevated triglycerides/Obesity: -meds: crestor increased to 40mg  last check -we have discussed wt loss options including meds and surgery and lifestyle recs in the past -concern for psych side effects with most of the meds for wt loss - he did opt to try orlistat in the past but was too expensive - may consider doing short term -has been doing better with diet, not doing regular exercise  hx depression:  -on several other medications in the past and did not tolerate  -meds: celexa, advised adding CBT last visit to help with irritability -reports: doing better -denies SI, thoughts of self harm  Chronic back pain/pain:  -followed by PMR (Dr. 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, trochanteric bursitis, steroid injections  migraines - followed by neurology, reports had sleep study  Seasonal allergies/mild intermittent asthma: -uses flonase and alb prn -denies SOB, wheezing, frequent asthma symptoms  ROS: See pertinent positives and negatives per HPI.  Past Medical History:  Diagnosis Date  . Allergy   . Arthritis   . Asthma   . Back pain   . Blood in stool   . Cluster headache   . Depression   . Headache(784.0)    frequent   . Hyperlipidemia   . Hypertension   . Lumbosacral spondylosis without myelopathy 06/15/2013  . Migraines   . Postlaminectomy syndrome, lumbar region 03/08/2015   Status post L4-L5 decompression and fusion, anterior/posterior in 2011   . Stroke [facility])    pt states "mini-Stroke"  . UTI (urinary tract infection)     Past Surgical History:  Procedure Laterality Date  . ANTERIOR AND  POSTERIOR SPINAL FUSION  2011  . BACK SURGERY    . LUMBAR LAMINECTOMY/DECOMPRESSION MICRODISCECTOMY  2010    Family History  Problem Relation Age of Onset  . Heart disease Mother   . Hypertension Mother   . Arthritis Father   . Hypertension Father   . Prostate cancer Father   . Miscarriages / Stillbirths Maternal Grandmother   . Heart disease Maternal Grandfather   . Sudden death Maternal Grandfather   . Breast cancer Sister   . Migraines Sister     Social History   Social History  . Marital status: Married    Spouse name: [name]  . Number of children: 1  . Years of education: 12th   Occupational History  .      n/a   Social History Main Topics  . Smoking status: Never Smoker  . Smokeless tobacco: Never Used  . Alcohol use Yes     Comment: once a month  . Drug use: No  . Sexual activity: Yes    Birth control/ protection: None   Other Topics Concern  . None   Social History Narrative   Patient lives at home with his family.   Caffeine Use: 2 cups daily     Current Outpatient Prescriptions:  .  acetaminophen (TYLENOL) 500 MG tablet, Take 1,500 mg by mouth every 4 (four) hours. , Disp: , Rfl:  .  citalopram (CELEXA) 40 MG tablet, Take 1 tablet (40 mg total) by mouth daily., Disp: 90 tablet, Rfl: 3 .  gabapentin (NEURONTIN) 300 MG capsule, Take  1 capsule (300 mg total) by mouth 2 (two) times daily., Disp: 60 capsule, Rfl: 1 .  hydrochlorothiazide (HYDRODIURIL) 25 MG tablet, Take 1 tablet (25 mg total) by mouth daily., Disp: 30 tablet, Rfl: 0 .  ibuprofen (ADVIL,MOTRIN) 200 MG tablet, Take 800 mg by mouth every 6 (six) hours as needed (pain). , Disp: , Rfl:  .  lisinopril (PRINIVIL,ZESTRIL) 20 MG tablet, Take 1 tablet (20 mg total) by mouth daily., Disp: 90 tablet, Rfl: 3 .  meloxicam (MOBIC) 15 MG tablet, Take 1 tablet (15 mg total) by mouth daily., Disp: 30 tablet, Rfl: 1 .  metFORMIN (GLUCOPHAGE) 500 MG tablet, Take 1 tablet (500 mg total) by mouth 2 (two)  times daily with a meal., Disp: 180 tablet, Rfl: 3 .  rosuvastatin (CRESTOR) 40 MG tablet, Take 1 tablet (40 mg total) by mouth daily., Disp: 90 tablet, Rfl: 3  EXAM:  Vitals:   04/14/16 0911  BP: 118/86  Pulse: 87  Temp: 98.7 F (37.1 C)    Body mass index is 35.77 kg/m.  GENERAL: vitals reviewed and listed above, alert, oriented, appears well hydrated and in no acute distress  HEENT: atraumatic, conjunttiva clear, no obvious abnormalities on inspection of external nose and ears  NECK: no obvious masses on inspection  LUNGS: clear to auscultation bilaterally, no wheezes, rales or rhonchi, good air movement  CV: HRRR, no peripheral edema  MS: moves all extremities without noticeable abnormality  PSYCH: pleasant and cooperative, no obvious depression or anxiety  ASSESSMENT AND PLAN:  Discussed the following assessment and plan:  Essential hypertension - Plan: Basic metabolic panel, CBC (no diff)  Hyperlipemia - Plan: Lipid Panel  BMI 35.0-35.9,adult  Hyperglycemia - Plan: Hemoglobin A1c  Chronic low back pain - hx spinal stenosis, decompression surgery in 2010, 2011 -labs: lipids, hgba1c, bmp, cbc -Patient advised to return or notify a doctor immediately if symptoms worsen or persist or new concerns arise.  Patient Instructions  BEFORE YOU LEAVE: -follow up: 3-4 months -labs  See your back specialist. Let me know if you want referral to orthopedics about the knees if not felt to be back related.  We recommend the following healthy lifestyle: 1) Small portions - eat off of salad plate instead of dinner plate 2) Eat a healthy clean diet with avoidance of (less then 1 serving per week) processed foods, sweetened drinks, white starches, red meat, fast foods and sweets and consisting of: * 5-9 servings per day of fresh or frozen fruits and vegetables (not corn or potatoes, not dried or canned) *nuts and seeds, beans *olives and olive oil *small portions of lean  meats such as fish and white chicken  *small portions of whole grains 3)Get at least 150 minutes of sweaty aerobic exercise per week 4)reduce stress - counseling, meditation, relaxation to balance other aspects of your life  We have ordered labs or studies at this visit. It can take up to 1-2 weeks for results and processing. IF results require follow up or explanation, we will call you with instructions. Clinically stable results will be released to your [facility]. If you have not heard from [place] or cannot find your results in [facility] in 2 weeks please contact our office at [phone].  If you are not yet signed up for [facility], please consider signing up.     Kriste Basque R., DO

## 2016-04-14 ENCOUNTER — Ambulatory Visit (INDEPENDENT_AMBULATORY_CARE_PROVIDER_SITE_OTHER): Payer: Commercial Managed Care - HMO | Admitting: Family Medicine

## 2016-04-14 ENCOUNTER — Encounter: Payer: Self-pay | Admitting: Family Medicine

## 2016-04-14 VITALS — BP 118/86 | HR 87 | Temp 98.7°F | Ht 66.0 in | Wt 221.6 lb

## 2016-04-14 DIAGNOSIS — R739 Hyperglycemia, unspecified: Secondary | ICD-10-CM

## 2016-04-14 DIAGNOSIS — E785 Hyperlipidemia, unspecified: Secondary | ICD-10-CM

## 2016-04-14 DIAGNOSIS — I10 Essential (primary) hypertension: Secondary | ICD-10-CM | POA: Diagnosis not present

## 2016-04-14 DIAGNOSIS — E119 Type 2 diabetes mellitus without complications: Secondary | ICD-10-CM | POA: Insufficient documentation

## 2016-04-14 DIAGNOSIS — Z6835 Body mass index (BMI) 35.0-35.9, adult: Secondary | ICD-10-CM | POA: Diagnosis not present

## 2016-04-14 DIAGNOSIS — M545 Low back pain, unspecified: Secondary | ICD-10-CM

## 2016-04-14 DIAGNOSIS — G8929 Other chronic pain: Secondary | ICD-10-CM

## 2016-04-14 LAB — CBC
HCT: 42 % (ref 39.0–52.0)
HEMOGLOBIN: 14.3 g/dL (ref 13.0–17.0)
MCHC: 34.2 g/dL (ref 30.0–36.0)
MCV: 93.8 fl (ref 78.0–100.0)
PLATELETS: 291 10*3/uL (ref 150.0–400.0)
RBC: 4.47 Mil/uL (ref 4.22–5.81)
RDW: 13.1 % (ref 11.5–15.5)
WBC: 6.7 10*3/uL (ref 4.0–10.5)

## 2016-04-14 LAB — LIPID PANEL
CHOLESTEROL: 234 mg/dL — AB (ref 0–200)
HDL: 49.3 mg/dL (ref >39.00)
LDL Cholesterol: 154 mg/dL — ABNORMAL HIGH (ref 0–99)
NonHDL: 184.34
Total CHOL/HDL Ratio: 5
Triglycerides: 152 mg/dL — ABNORMAL HIGH (ref 0.0–149.0)
VLDL: 30.4 mg/dL (ref 0.0–40.0)

## 2016-04-14 LAB — BASIC METABOLIC PANEL
BUN: 14 mg/dL (ref 6–23)
CALCIUM: 9.5 mg/dL (ref 8.4–10.5)
CHLORIDE: 106 meq/L (ref 96–112)
CO2: 27 mEq/L (ref 19–32)
Creatinine, Ser: 1 mg/dL (ref 0.40–1.50)
GFR: 85.43 mL/min (ref >60.00)
Glucose, Bld: 92 mg/dL (ref 70–99)
Potassium: 4.3 mEq/L (ref 3.5–5.1)
Sodium: 141 mEq/L (ref 135–145)

## 2016-04-14 LAB — HEMOGLOBIN A1C: Hgb A1c MFr Bld: 5.9 % (ref 4.6–6.5)

## 2016-04-14 NOTE — Progress Notes (Signed)
Pre visit review using our clinic review tool, if applicable. No additional management support is needed unless otherwise documented below in the visit note. 

## 2016-04-14 NOTE — Patient Instructions (Addendum)
BEFORE YOU LEAVE: -follow up: 3-4 months -labs  See your back specialist. Let me know if you want referral to orthopedics about the knees if not felt to be back related.  We recommend the following healthy lifestyle: 1) Small portions - eat off of salad plate instead of dinner plate 2) Eat a healthy clean diet with avoidance of (less then 1 serving per week) processed foods, sweetened drinks, white starches, red meat, fast foods and sweets and consisting of: * 5-9 servings per day of fresh or frozen fruits and vegetables (not corn or potatoes, not dried or canned) *nuts and seeds, beans *olives and olive oil *small portions of lean meats such as fish and white chicken  *small portions of whole grains 3)Get at least 150 minutes of sweaty aerobic exercise per week 4)reduce stress - counseling, meditation, relaxation to balance other aspects of your life  We have ordered labs or studies at this visit. It can take up to 1-2 weeks for results and processing. IF results require follow up or explanation, we will call you with instructions. Clinically stable results will be released to your [facility] If you have not heard from [place] or cannot find your results in [facility] in 2 weeks please contact our office at [phone].  If you are not yet signed up for [facility], please consider signing up.

## 2016-04-20 ENCOUNTER — Ambulatory Visit: Payer: Commercial Managed Care - HMO | Admitting: Family Medicine

## 2016-04-21 ENCOUNTER — Ambulatory Visit (HOSPITAL_BASED_OUTPATIENT_CLINIC_OR_DEPARTMENT_OTHER): Payer: Commercial Managed Care - HMO | Admitting: Physical Medicine & Rehabilitation

## 2016-04-21 ENCOUNTER — Encounter: Payer: Commercial Managed Care - HMO | Attending: Physical Medicine & Rehabilitation

## 2016-04-21 ENCOUNTER — Encounter: Payer: Self-pay | Admitting: Physical Medicine & Rehabilitation

## 2016-04-21 VITALS — BP 148/103 | HR 98

## 2016-04-21 DIAGNOSIS — M545 Low back pain: Secondary | ICD-10-CM | POA: Diagnosis not present

## 2016-04-21 DIAGNOSIS — M533 Sacrococcygeal disorders, not elsewhere classified: Secondary | ICD-10-CM | POA: Insufficient documentation

## 2016-04-21 DIAGNOSIS — M48062 Spinal stenosis, lumbar region with neurogenic claudication: Secondary | ICD-10-CM

## 2016-04-21 DIAGNOSIS — E669 Obesity, unspecified: Secondary | ICD-10-CM | POA: Diagnosis not present

## 2016-04-21 DIAGNOSIS — M961 Postlaminectomy syndrome, not elsewhere classified: Secondary | ICD-10-CM | POA: Diagnosis not present

## 2016-04-21 DIAGNOSIS — M4806 Spinal stenosis, lumbar region: Secondary | ICD-10-CM | POA: Diagnosis not present

## 2016-04-21 DIAGNOSIS — I10 Essential (primary) hypertension: Secondary | ICD-10-CM | POA: Insufficient documentation

## 2016-04-21 DIAGNOSIS — G8929 Other chronic pain: Secondary | ICD-10-CM | POA: Diagnosis not present

## 2016-04-21 DIAGNOSIS — M5416 Radiculopathy, lumbar region: Secondary | ICD-10-CM | POA: Diagnosis not present

## 2016-04-21 DIAGNOSIS — Z981 Arthrodesis status: Secondary | ICD-10-CM | POA: Insufficient documentation

## 2016-04-21 DIAGNOSIS — E785 Hyperlipidemia, unspecified: Secondary | ICD-10-CM | POA: Insufficient documentation

## 2016-04-21 DIAGNOSIS — F3340 Major depressive disorder, recurrent, in remission, unspecified: Secondary | ICD-10-CM | POA: Insufficient documentation

## 2016-04-21 MED ORDER — GABAPENTIN 300 MG PO CAPS: 300.0000 mg | ORAL_CAPSULE | Freq: Three times a day (TID) | ORAL | 1 refills | Status: DC

## 2016-04-21 NOTE — Progress Notes (Signed)
Subjective:    Patient ID: Gary DrumJerry D Reynoso, male    DOB: 01/12/1971, 45 y.o.   MRN: [id number]  HPI CC Left > Right lower ext pain Has bilateral knee Left Greater than right. He also complains of back pain that radiates into both lower extremities, even below the knee. This is accompanied by some tingling. This occurs with walking for longer distances, such [place] [employer]. Patient has had no falls or trauma. 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 Additional recent stressors include his mother's hospitalization. Patient denies any lower extremity weakness. He's had no bowel or bladder dysfunction. No recent fevers or chills or other illnesses.  Pain Inventory Average Pain 6 Pain Right Now 4 My pain is constant, stabbing, tingling and numbness  In the last 24 hours, has pain interfered with the following? General activity 3 Relation with others 3 Enjoyment of life 7 What TIME of day is your pain at its worst? daytime Sleep (in general) Fair  Pain is worse with: walking, bending and standing Pain improves with: rest and medication Relief from Meds: 2  Mobility walk without assistance how many minutes can you walk? 15-20 ability to climb steps?  yes do you drive?  yes Do you have any goals in this area?  yes  Function disabled: date disabled . Do you have any goals in this area?  yes  Neuro/Psych numbness trouble walking depression anxiety  Prior Studies Any changes since last visit?  no  Physicians involved in your care Any changes since last visit?  no   Family History  Problem Relation Age of Onset  . Heart disease Mother   . Hypertension Mother   . Arthritis Father   . Hypertension Father   . Prostate cancer Father   . Miscarriages / Stillbirths Maternal Grandmother   . Heart disease Maternal Grandfather   . Sudden death Maternal Grandfather   . Breast  cancer Sister   . Migraines Sister    Social History   Social History  . Marital status: Married    Spouse name: Gary Clark  . Number of children: 1  . Years of education: 12th   Occupational History  .      n/a   Social History Main Topics  . Smoking status: Never Smoker  . Smokeless tobacco: Never Used  . Alcohol use Yes     Comment: once a month  . Drug use: No  . Sexual activity: Yes    Birth control/ protection: None   Other Topics Concern  . None   Social History Narrative   Patient lives at home with his family.   Caffeine Use: 2 cups daily   Past Surgical History:  Procedure Laterality Date  . ANTERIOR AND POSTERIOR SPINAL FUSION  2011  . BACK SURGERY    . LUMBAR LAMINECTOMY/DECOMPRESSION MICRODISCECTOMY  2010   Past Medical History:  Diagnosis Date  . Allergy   . Arthritis   . Asthma   . Back pain   . Blood in stool   . Cluster headache   . Depression   . Headache(784.0)    frequent   . Hyperlipidemia   . Hypertension   . Lumbosacral spondylosis without myelopathy 06/15/2013  . Migraines   . Postlaminectomy syndrome, lumbar region 03/08/2015   Status post L4-L5 decompression and fusion, anterior/posterior in 2011   . Stroke [facility]    pt states "  mini-Stroke"  . UTI (urinary tract infection)    There were no vitals taken for this visit.  Opioid Risk Score:   Fall Risk Score:  `1  Depression screen PHQ 2/9  Depression screen [facility] 2/9 06/10/2015 04/09/2015  Decreased Interest 2 0  Down, Depressed, Hopeless 1 0  PHQ - 2 Score 3 0  Altered sleeping 2 -  Tired, decreased energy 2 -  Change in appetite 2 -  Feeling bad or failure about yourself  1 -  Trouble concentrating 2 -  Moving slowly or fidgety/restless 0 -  Suicidal thoughts 0 -  PHQ-9 Score 12 -  Difficult doing work/chores Somewhat difficult -    Review of Systems  Constitutional: Positive for diaphoresis and unexpected weight change.  HENT: Negative.   Eyes: Negative.     Respiratory: Positive for apnea.   Cardiovascular: Negative.   Genitourinary: Negative.   Musculoskeletal: Positive for gait problem.  Allergic/Immunologic: Negative.   Neurological: Positive for numbness.  Psychiatric/Behavioral: Positive for dysphoric mood. The patient is nervous/anxious.   All other systems reviewed and are negative.      Objective:   Physical Exam  Constitutional: He is oriented to person, place, and time. He appears well-developed.  Obese  HENT:  Head: Normocephalic and atraumatic.  Eyes: Conjunctivae and EOM are normal. Pupils are equal, round, and reactive to light.  Neck: Normal range of motion.  Neurological: He is alert and oriented to person, place, and time. He displays no atrophy. No sensory deficit. He exhibits normal muscle tone. Coordination and gait normal.  Reflex Scores:      Patellar reflexes are 1+ on the right side and 1+ on the left side.      Achilles reflexes are 1+ on the right side and 1+ on the left side. Negative straight leg raise. Positive right femoral stretch test Pinprick examination equal and normal. Bilateral L2, L3, L4, L5, S1 dermatomal distribution. Motor strength is 5/5 bilateral hip flexor, knee extensor, ankle dorsiflex and plantar flexor. Gait is without evidence of toe drag or knee instability   Psychiatric: He has a normal mood and affect.  Nursing note and vitals reviewed.         Assessment & Plan:  1. Lumbar postlaminectomy syndrome. He has a history of L4-L5 fusion, anterior and posterior. His last MRI was about 2 years ago, which demonstrated adjacent level degeneration with spondylolisthesis at L3-L4. I suspect he may have progressed over time and his lower extremity complaints are due to neurogenic claudication. Will repeat MRI to further assess whether his spondylolisthesis has progressed. We'll discuss further options. Once we have the imaging study. Return to clinic in one month. We generally described  treatment options, including spinal injections, surgical referral, medication management. For now, we will increase gabapentin to 300 mg 3 times a day Discussed with the patient agrees with plan

## 2016-04-30 ENCOUNTER — Telehealth: Payer: Self-pay | Admitting: Physical Medicine & Rehabilitation

## 2016-04-30 DIAGNOSIS — M48062 Spinal stenosis, lumbar region with neurogenic claudication: Secondary | ICD-10-CM

## 2016-04-30 NOTE — Telephone Encounter (Signed)
[facility] needs a call back to clarify orders for MRI for Lumbar tomorrow.  Per their protocol it should be pre contrast without contrast imaging, and what was ordered was Lumbar with contrast only.  Please call him back to clarify this, patient is scheduled for tomorrow.

## 2016-04-30 NOTE — Telephone Encounter (Signed)
With and without contrast.

## 2016-04-30 NOTE — Telephone Encounter (Signed)
Notified Jerilee HohKeegan and new order placed for W/ WO contrast. I could not cancel the old order because it has been scheduled.

## 2016-04-30 NOTE — Telephone Encounter (Signed)
Can you clarify? They would like an order for MRI with and w/o contrast. If no adjustment needed, I will call [facility] to let them know it was correct.

## 2016-05-01 ENCOUNTER — Inpatient Hospital Stay: Admission: RE | Admit: 2016-05-01 | Payer: Commercial Managed Care - HMO | Source: Ambulatory Visit

## 2016-05-01 ENCOUNTER — Ambulatory Visit
Admission: RE | Admit: 2016-05-01 | Discharge: 2016-05-01 | Disposition: A | Discharge status: Home / Self Care | Payer: Commercial Managed Care - HMO | Source: Ambulatory Visit | Attending: Physical Medicine & Rehabilitation | Admitting: Physical Medicine & Rehabilitation

## 2016-05-01 DIAGNOSIS — M48062 Spinal stenosis, lumbar region with neurogenic claudication: Secondary | ICD-10-CM

## 2016-05-01 DIAGNOSIS — M4806 Spinal stenosis, lumbar region: Secondary | ICD-10-CM | POA: Diagnosis not present

## 2016-05-01 MED ORDER — GADOBENATE DIMEGLUMINE 529 MG/ML IV SOLN
20.0000 mL | Freq: Once | INTRAVENOUS | Status: AC | PRN
  Administered 2016-05-01: 20 mL via INTRAVENOUS

## 2016-05-19 ENCOUNTER — Encounter: Payer: Self-pay | Admitting: Physical Medicine & Rehabilitation

## 2016-05-19 ENCOUNTER — Ambulatory Visit (HOSPITAL_BASED_OUTPATIENT_CLINIC_OR_DEPARTMENT_OTHER): Payer: Commercial Managed Care - HMO | Admitting: Physical Medicine & Rehabilitation

## 2016-05-19 ENCOUNTER — Encounter: Payer: Commercial Managed Care - HMO | Attending: Physical Medicine & Rehabilitation

## 2016-05-19 VITALS — BP 144/101 | HR 97 | Resp 14

## 2016-05-19 DIAGNOSIS — E669 Obesity, unspecified: Secondary | ICD-10-CM | POA: Insufficient documentation

## 2016-05-19 DIAGNOSIS — E785 Hyperlipidemia, unspecified: Secondary | ICD-10-CM | POA: Insufficient documentation

## 2016-05-19 DIAGNOSIS — M222X1 Patellofemoral disorders, right knee: Secondary | ICD-10-CM

## 2016-05-19 DIAGNOSIS — F3340 Major depressive disorder, recurrent, in remission, unspecified: Secondary | ICD-10-CM | POA: Diagnosis not present

## 2016-05-19 DIAGNOSIS — M961 Postlaminectomy syndrome, not elsewhere classified: Secondary | ICD-10-CM | POA: Diagnosis not present

## 2016-05-19 DIAGNOSIS — G8929 Other chronic pain: Secondary | ICD-10-CM | POA: Diagnosis not present

## 2016-05-19 DIAGNOSIS — M5416 Radiculopathy, lumbar region: Secondary | ICD-10-CM | POA: Insufficient documentation

## 2016-05-19 DIAGNOSIS — M545 Low back pain: Secondary | ICD-10-CM | POA: Insufficient documentation

## 2016-05-19 DIAGNOSIS — M222X2 Patellofemoral disorders, left knee: Secondary | ICD-10-CM | POA: Diagnosis not present

## 2016-05-19 DIAGNOSIS — I10 Essential (primary) hypertension: Secondary | ICD-10-CM | POA: Diagnosis not present

## 2016-05-19 DIAGNOSIS — M5417 Radiculopathy, lumbosacral region: Secondary | ICD-10-CM | POA: Insufficient documentation

## 2016-05-19 DIAGNOSIS — M533 Sacrococcygeal disorders, not elsewhere classified: Secondary | ICD-10-CM | POA: Insufficient documentation

## 2016-05-19 DIAGNOSIS — Z981 Arthrodesis status: Secondary | ICD-10-CM | POA: Insufficient documentation

## 2016-05-19 MED ORDER — GABAPENTIN 400 MG PO CAPS: 400.0000 mg | ORAL_CAPSULE | Freq: Three times a day (TID) | ORAL | 2 refills | Status: DC

## 2016-05-19 NOTE — Patient Instructions (Addendum)
Left L4 transforaminal injection should help with the pain shooting down the left leg  Would recommend jumping jacks while laying on your stomach 3 sets of 10, working up to 3 sets of 25 every day  Dr. [facility]orel [facility] [address] [place], [place] [id number] [phone]   Patellofemoral Pain Syndrome Patellofemoral pain syndrome is a condition that involves a softening or breakdown of the tissue (cartilage) on the underside of your kneecap (patella). This causes pain in the front of the knee. The condition is also called runner's knee or chondromalacia patella. Patellofemoral pain syndrome is most common in young adults who are active in sports. Your knee is the largest joint in your body. The patella covers the front of your knee and is attached to muscles above and below your knee. The underside of the patella is covered with a smooth type of cartilage (synovium). The smooth surface helps the patella glide easily when you move your knee. Patellofemoral pain syndrome causes swelling in the joint linings and bone surfaces in your knee.  CAUSES  Patellofemoral pain syndrome can be caused by:  Overuse.  Poor alignment of your knee joints.  Weak leg muscles.  A direct blow to your kneecap. RISK FACTORS You may be at risk for patellofemoral pain syndrome if you:  Do a lot of activities that can wear down your kneecap. These include:  Running.  Squatting.  Climbing stairs.  Start a new physical activity or exercise program.  Wear shoes that do not fit well.  Do not have good leg strength.  Are overweight. SIGNS AND SYMPTOMS  Knee pain is the most common symptom of patellofemoral pain syndrome. This may feel like a dull, aching pain underneath your patella, in the front of your knee. There may be a popping or cracking sound when you move your knee. Pain may get worse with:  Exercise.  Climbing  stairs.  Running.  Jumping.  Squatting.  Kneeling.  Sitting for a long time.  Moving or pushing on your patella. DIAGNOSIS  Your health care provider may be able to diagnose patellofemoral pain syndrome from your symptoms and medical history. You may be asked about your recent physical activities and which ones cause knee pain. Your health care provider may do a physical exam with certain tests to confirm the diagnosis. These may include:  Moving your patella back and forth.  Checking your range of knee motion.  Having you squat or jump to see if you have pain.  Checking the strength of your leg muscles. An MRI of the knee may also be done. TREATMENT  Patellofemoral pain syndrome can usually be treated at home with rest, ice, compression, and elevation (RICE). Other treatments may include:  Nonsteroidal anti-inflammatory drugs (NSAIDs).  Physical therapy to stretch and strengthen your leg muscles.  Shoe inserts (orthotics) to take stress off your knee.  A knee brace or knee support.  Surgery to remove damaged cartilage or move the patella to a better position. The need for surgery is rare. HOME CARE INSTRUCTIONS   Take medicines only as directed by your health care provider.  Rest your knee.  When resting, keep your knee raised above the level of your heart.  Avoid activities that cause knee pain.  Apply ice to the injured area:  Put ice in a plastic bag.  Place a towel between your skin and the bag.  Leave the ice on for 20 minutes, 2-3 times a day.  Use splints, braces, knee  supports, or walking aids as directed by your health care provider.  Perform stretching and strengthening exercises as directed by your health care provider or physical therapist.  Keep all follow-up visits as directed by your health care provider. This is important. SEEK MEDICAL CARE IF:   Your symptoms get worse.  You are not improving with home care. MAKE SURE  YOU:  Understand these instructions.  Will watch your condition.  Will get help right away if you are not doing well or get worse.   This information is not intended to replace advice given to you by your health care provider. Make sure you discuss any questions you have with your health care provider.   Document Released: 08/19/2009 Document Revised: 09/21/2014 Document Reviewed: 11/20/2013 Elsevier Interactive Patient Education [employer]

## 2016-05-19 NOTE — Progress Notes (Signed)
Subjective:    Patient ID: Gary Clark, male    DOB: 1971/08/04, 45 y.o.   MRN: [id number]    HPI CC Left > Right lower ext pain Has bilateral knee Left Greater than right. He also complains of back pain that radiates into both lower extremities, even below the knee. This is accompanied by some tingling on the left side This occurs with walking for longer distances,   Patient follows up after having MRI lumbar spine for further evaluation. In addition to his back symptoms. He has knee pain as well as some cracking sensation in both kneecaps. He's had no trauma to the area. Pain Inventory Average Pain 6 Pain Right Now 6 My pain is intermittent, sharp, stabbing and tingling  In the last 24 hours, has pain interfered with the following? General activity 6 Relation with others 6 Enjoyment of life 9 What TIME of day is your pain at its worst? morning evening Sleep (in general) Poor  Pain is worse with: walking, bending and standing Pain improves with: rest and medication Relief from Meds: 0  Mobility walk without assistance how many minutes can you walk? 15-20 ability to climb steps?  yes do you drive?  yes Do you have any goals in this area?  yes  Function disabled: date disabled . Do you have any goals in this area?  yes  Neuro/Psych numbness trouble walking depression anxiety  Prior Studies Any changes since last visit?  no  Physicians involved in your care Any changes since last visit?  no   Family History  Problem Relation Age of Onset  . Heart disease Mother   . Hypertension Mother   . Arthritis Father   . Hypertension Father   . Prostate cancer Father   . Miscarriages / Stillbirths Maternal Grandmother   . Heart disease Maternal Grandfather   . Sudden death Maternal Grandfather   . Breast cancer Sister   . Migraines Sister    Social History   Social History  . Marital status: Married    Spouse name: [name]  . Number of children: 1  . Years  of education: 12th   Occupational History  .      n/a   Social History Main Topics  . Smoking status: Never Smoker  . Smokeless tobacco: Never Used  . Alcohol use Yes     Comment: once a month  . Drug use: No  . Sexual activity: Yes    Birth control/ protection: None   Other Topics Concern  . None   Social History Narrative   Patient lives at home with his family.   Caffeine Use: 2 cups daily   Past Surgical History:  Procedure Laterality Date  . ANTERIOR AND POSTERIOR SPINAL FUSION  2011  . BACK SURGERY    . LUMBAR LAMINECTOMY/DECOMPRESSION MICRODISCECTOMY  2010   Past Medical History:  Diagnosis Date  . Allergy   . Arthritis   . Asthma   . Back pain   . Blood in stool   . Cluster headache   . Depression   . Headache(784.0)    frequent   . Hyperlipidemia   . Hypertension   . Lumbosacral spondylosis without myelopathy 06/15/2013  . Migraines   . Postlaminectomy syndrome, lumbar region 03/08/2015   Status post L4-L5 decompression and fusion, anterior/posterior in 2011   . Stroke [facility])    pt states "mini-Stroke"  . UTI (urinary tract infection)    BP (!) 144/101 (BP Location: Left Arm,  Patient Position: Sitting, Cuff Size: Large)   Pulse 97   Resp 14   SpO2 96%   Opioid Risk Score:   Fall Risk Score:  `1  Depression screen PHQ 2/9  [facility] 05/19/2016 06/10/2015 04/09/2015  Decreased Interest 0 2 0  Down, Depressed, Hopeless 0 1 0  PHQ - 2 Score 0 3 0  Altered sleeping - 2 -  Tired, decreased energy - 2 -  Change in appetite - 2 -  Feeling bad or failure about yourself  - 1 -  Trouble concentrating - 2 -  Moving slowly or fidgety/restless - 0 -  Suicidal thoughts - 0 -  PHQ-9 Score - 12 -  Difficult doing work/chores - Somewhat difficult -    Review of Systems  Constitutional: Positive for diaphoresis.  HENT: Negative.   Eyes: Negative.   Respiratory: Positive for apnea.   Cardiovascular: Negative.   Endocrine:       High blood  sugars  Genitourinary: Negative.   Musculoskeletal: Positive for gait problem.  Allergic/Immunologic: Negative.   Neurological: Positive for numbness.  Psychiatric/Behavioral: Positive for dysphoric mood. The patient is nervous/anxious.   All other systems reviewed and are negative.      Objective:   Physical Exam  Constitutional: He is oriented to person, place, and time. He appears well-developed.  Obese  HENT:  Head: Normocephalic and atraumatic.  Eyes: Conjunctivae and EOM are normal. Pupils are equal, round, and reactive to light.  Neck: Normal range of motion.  Musculoskeletal:  Lumbar spine has tenderness palpation bilateral L3, L4, L5, S1 paraspinal area  Neurological: He is alert and oriented to person, place, and time. He displays no atrophy. No sensory deficit. He exhibits normal muscle tone. Coordination and gait normal.  Negative straight leg raise. Positive right femoral stretch test Sensation intact bilateral lower extremities Motor strength is 5/5 bilateral hip flexor, knee extensor, ankle dorsiflex and plantar flexor. Gait is without evidence of toe drag or knee instability   Psychiatric: He has a normal mood and affect.  Nursing note and vitals reviewed.         Assessment & Plan:  1. Lumbar postlaminectomy syndrome. 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 For now, we will increase gabapentin to 400 mg 3 times a day At this point, patient is not interested in surgery.  We discussed core stabilization, as well as, prone jumping jacks as a specific exercise Discussed with the patient agrees with plan   2. Bilateral knee pain, crepitus) left. He has no joint line tenderness or effusion. No pain with knee twisting, modified  [place] Suspect that this is patellofemoral syndrome, will educate, will likely need some knee terminal extension exercises as well. He will first focus on his back. However If no improvement with conservative means may need some x-rays and injections as well

## 2016-05-22 ENCOUNTER — Encounter: Payer: Self-pay | Admitting: Family Medicine

## 2016-05-22 ENCOUNTER — Ambulatory Visit (INDEPENDENT_AMBULATORY_CARE_PROVIDER_SITE_OTHER): Payer: Commercial Managed Care - HMO | Admitting: Family Medicine

## 2016-05-22 VITALS — BP 132/90 | HR 94 | Temp 98.5°F | Ht 66.0 in | Wt 219.3 lb

## 2016-05-22 DIAGNOSIS — I10 Essential (primary) hypertension: Secondary | ICD-10-CM

## 2016-05-22 DIAGNOSIS — R0789 Other chest pain: Secondary | ICD-10-CM

## 2016-05-22 DIAGNOSIS — M79602 Pain in left arm: Secondary | ICD-10-CM | POA: Diagnosis not present

## 2016-05-22 MED ORDER — LISINOPRIL 20 MG PO TABS: 30.0000 mg | ORAL_TABLET | Freq: Every day | ORAL | 3 refills | Status: DC

## 2016-05-22 NOTE — Patient Instructions (Signed)
BEFORE YOU LEAVE: -follow up: in 1 month -arm exercises  -We placed a referral for you as discussed for the stress test. It usually takes about 1-2 weeks to process and schedule this referral. If you have not heard from us regarding this appointment in 2 weeks please contact our office.  -increase lisinopril to 1.5 tablets daily  For the arm pain: -do the exercises provided 4 days per week -ice or heat 15 minutes twice daily and topical menthol as needed for pain

## 2016-05-22 NOTE — Progress Notes (Signed)
HPI:  Acute visit for:  L arm pain: -started about 1 month ago and worsening -can't think of initiating activity or injury -pain is mostly in the ext muscles/tendons forearm and is worsened by activation of these muscles; tiger balm helps -has been compensating and some soreness in upper arm/pecs muscles at times as well - not exertional and constant when occurs, but he was worried about his heart -no weakness, numbness, fevers, malaise, DOE, CP with exertion, palpitations -lots of stress recently  ROS: See pertinent positives and negatives per HPI.  Past Medical History:  Diagnosis Date  . Allergy   . Arthritis   . Asthma   . Back pain   . Blood in stool   . Cluster headache   . Depression   . Headache(784.0)    frequent   . Hyperlipidemia   . Hypertension   . Lumbosacral spondylosis without myelopathy 06/15/2013  . Migraines   . Postlaminectomy syndrome, lumbar region 03/08/2015   Status post L4-L5 decompression and fusion, anterior/posterior in 2011   . Stroke [facility])    pt states "mini-Stroke"  . UTI (urinary tract infection)     Past Surgical History:  Procedure Laterality Date  . ANTERIOR AND POSTERIOR SPINAL FUSION  2011  . BACK SURGERY    . LUMBAR LAMINECTOMY/DECOMPRESSION MICRODISCECTOMY  2010    Family History  Problem Relation Age of Onset  . Heart disease Mother   . Hypertension Mother   . Arthritis Father   . Hypertension Father   . Prostate cancer Father   . Miscarriages / Stillbirths Maternal Grandmother   . Heart disease Maternal Grandfather   . Sudden death Maternal Grandfather   . Breast cancer Sister   . Migraines Sister     Social History   Social History  . Marital status: Married    Spouse name: [name]  . Number of children: 1  . Years of education: 12th   Occupational History  .      n/a   Social History Main Topics  . Smoking status: Never Smoker  . Smokeless tobacco: Never Used  . Alcohol use Yes     Comment: once a  month  . Drug use: No  . Sexual activity: Yes    Birth control/ protection: None   Other Topics Concern  . None   Social History Narrative   Patient lives at home with his family.   Caffeine Use: 2 cups daily     Current Outpatient Prescriptions:  .  acetaminophen (TYLENOL) 500 MG tablet, Take 1,500 mg by mouth every 4 (four) hours. , Disp: , Rfl:  .  citalopram (CELEXA) 40 MG tablet, Take 1 tablet (40 mg total) by mouth daily., Disp: 90 tablet, Rfl: 3 .  gabapentin (NEURONTIN) 400 MG capsule, Take 1 capsule (400 mg total) by mouth 3 (three) times daily., Disp: 90 capsule, Rfl: 2 .  hydrochlorothiazide (HYDRODIURIL) 25 MG tablet, Take 1 tablet (25 mg total) by mouth daily., Disp: 30 tablet, Rfl: 0 .  ibuprofen (ADVIL,MOTRIN) 200 MG tablet, Take 800 mg by mouth every 6 (six) hours as needed (pain). , Disp: , Rfl:  .  lisinopril (PRINIVIL,ZESTRIL) 20 MG tablet, Take 1.5 tablets (30 mg total) by mouth daily., Disp: 135 tablet, Rfl: 3 .  meloxicam (MOBIC) 15 MG tablet, Take 1 tablet (15 mg total) by mouth daily., Disp: 30 tablet, Rfl: 1 .  metFORMIN (GLUCOPHAGE) 500 MG tablet, Take 1 tablet (500 mg total) by mouth 2 (two) times daily  with a meal., Disp: 180 tablet, Rfl: 3 .  rosuvastatin (CRESTOR) 40 MG tablet, Take 1 tablet (40 mg total) by mouth daily., Disp: 90 tablet, Rfl: 3  EXAM:  Vitals:   05/22/16 0819  BP: 132/90  Pulse: 94  Temp: 98.5 F (36.9 C)    Body mass index is 35.4 kg/m.  GENERAL: vitals reviewed and listed above, alert, oriented, appears well hydrated and in no acute distress  HEENT: atraumatic, conjunttiva clear, no obvious abnormalities on inspection of external nose and ears  NECK: no obvious masses on inspection  LUNGS: clear to auscultation bilaterally, no wheezes, rales or rhonchi, good air movement  CV: HRRR, no peripheral edema  MS: moves all extremities without noticeable abnormality; normal inspection arms, wrists, shoulder; TTP in the ext  tendons forearm and some in deltoid and pecs; normal ROM head and neck/arms; normal sensitivity to light touch, strength throughout in both upper extremities; ext wrist against resistance reproduces his arm pain; NV intact distally both UEs  PSYCH: pleasant and cooperative, no obvious depression or anxiety  ASSESSMENT AND PLAN:  Discussed the following assessment and plan:  Left arm pain  Atypical chest pain - Plan: Exercise Tolerance Test  Essential hypertension  -we discussed possible serious and likely etiologies, workup and treatment, treatment risks and return precautions -exam findings with reproducible symptoms suggest forearm ext strain as cause of primary complaint; other sore muscle may be from compensation; radicular symptoms also a possibility but less likely given distribution of symptoms -opted to treat with HEP, ice/heat, topical analgesics -will get stress test given his heart concerns - very atypical symptoms -increased lisinopril for mildly elevated BP-close f/u in 1 month and advised to return or notify a doctor immediately if symptoms worsen or new concerns arise.  Patient Instructions  BEFORE YOU LEAVE: -follow up: in 1 month -arm exercises  -We placed a referral for you as discussed for the stress test. It usually takes about 1-2 weeks to process and schedule this referral. If you have not heard from [place] regarding this appointment in 2 weeks please contact our office.  -increase lisinopril to 1.5 tablets daily  For the arm pain: -do the exercises provided 4 days per week -ice or heat 15 minutes twice daily and topical menthol as needed for pain    Kriste Basque R., DO

## 2016-05-22 NOTE — Progress Notes (Signed)
Pre visit review using our clinic review tool, if applicable. No additional management support is needed unless otherwise documented below in the visit note. 

## 2016-05-26 ENCOUNTER — Encounter: Payer: Self-pay | Admitting: Family Medicine

## 2016-05-28 ENCOUNTER — Ambulatory Visit (INDEPENDENT_AMBULATORY_CARE_PROVIDER_SITE_OTHER): Payer: Commercial Managed Care - HMO

## 2016-05-28 DIAGNOSIS — R0789 Other chest pain: Secondary | ICD-10-CM | POA: Diagnosis not present

## 2016-05-28 LAB — EXERCISE TOLERANCE TEST
CHL CUP RESTING HR STRESS: 115 {beats}/min
CHL CUP STRESS STAGE 1 GRADE: 0 %
CHL CUP STRESS STAGE 1 HR: 112 {beats}/min
CHL CUP STRESS STAGE 1 SBP: 113 mmHg
CHL CUP STRESS STAGE 1 SPEED: 0 mph
CHL CUP STRESS STAGE 2 GRADE: 0 %
CHL CUP STRESS STAGE 2 HR: 115 {beats}/min
CHL CUP STRESS STAGE 3 HR: 117 {beats}/min
CHL CUP STRESS STAGE 3 SPEED: 1 mph
CHL CUP STRESS STAGE 4 HR: 118 {beats}/min
CHL CUP STRESS STAGE 5 DBP: 77 mmHg
CHL CUP STRESS STAGE 5 GRADE: 10 %
CHL CUP STRESS STAGE 6 GRADE: 12 %
CHL CUP STRESS STAGE 6 HR: 139 {beats}/min
CHL CUP STRESS STAGE 6 SPEED: 2.5 mph
CHL CUP STRESS STAGE 7 GRADE: 14 %
CHL CUP STRESS STAGE 7 SPEED: 3.4 mph
CHL CUP STRESS STAGE 8 DBP: 78 mmHg
CHL CUP STRESS STAGE 8 GRADE: 0 %
CHL CUP STRESS STAGE 8 HR: 130 {beats}/min
CHL RATE OF PERCEIVED EXERTION: 16
CSEPED: 8 min
CSEPEDS: 0 s
CSEPEW: 10.1 METS
CSEPHR: 86 %
CSEPPHR: 148 {beats}/min
CSEPPMHR: 85 %
MPHR: 174 {beats}/min
Stage 1 DBP: 81 mmHg
Stage 2 Speed: 0 mph
Stage 3 Grade: 0 %
Stage 4 Grade: 0 %
Stage 4 Speed: 1 mph
Stage 5 HR: 129 {beats}/min
Stage 5 SBP: 117 mmHg
Stage 5 Speed: 1.7 mph
Stage 6 DBP: 79 mmHg
Stage 6 SBP: 149 mmHg
Stage 7 HR: 148 {beats}/min
Stage 8 SBP: 172 mmHg
Stage 8 Speed: 0 mph
Stage 9 DBP: 89 mmHg
Stage 9 Grade: 0 %
Stage 9 HR: 109 {beats}/min
Stage 9 SBP: 157 mmHg
Stage 9 Speed: 0 mph

## 2016-06-01 ENCOUNTER — Encounter: Payer: Self-pay | Admitting: Physical Medicine & Rehabilitation

## 2016-06-01 ENCOUNTER — Ambulatory Visit (HOSPITAL_BASED_OUTPATIENT_CLINIC_OR_DEPARTMENT_OTHER): Payer: Commercial Managed Care - HMO | Admitting: Physical Medicine & Rehabilitation

## 2016-06-01 VITALS — BP 132/80 | HR 97 | Resp 14

## 2016-06-01 DIAGNOSIS — E669 Obesity, unspecified: Secondary | ICD-10-CM | POA: Diagnosis not present

## 2016-06-01 DIAGNOSIS — I10 Essential (primary) hypertension: Secondary | ICD-10-CM | POA: Diagnosis not present

## 2016-06-01 DIAGNOSIS — Z981 Arthrodesis status: Secondary | ICD-10-CM | POA: Diagnosis not present

## 2016-06-01 DIAGNOSIS — M5417 Radiculopathy, lumbosacral region: Secondary | ICD-10-CM

## 2016-06-01 DIAGNOSIS — G8929 Other chronic pain: Secondary | ICD-10-CM | POA: Diagnosis not present

## 2016-06-01 DIAGNOSIS — E785 Hyperlipidemia, unspecified: Secondary | ICD-10-CM | POA: Diagnosis not present

## 2016-06-01 DIAGNOSIS — M5416 Radiculopathy, lumbar region: Secondary | ICD-10-CM

## 2016-06-01 DIAGNOSIS — M961 Postlaminectomy syndrome, not elsewhere classified: Secondary | ICD-10-CM | POA: Diagnosis not present

## 2016-06-01 DIAGNOSIS — M533 Sacrococcygeal disorders, not elsewhere classified: Secondary | ICD-10-CM | POA: Diagnosis not present

## 2016-06-01 DIAGNOSIS — M545 Low back pain: Secondary | ICD-10-CM | POA: Diagnosis not present

## 2016-06-01 NOTE — Progress Notes (Signed)
Lumbar Left L3-4  transforaminal epidural steroid injection under fluoroscopic guidance  Indication: Lumbosacral radiculitis is not relieved by medication management or other conservative care and interfering with self-care and mobility.   Informed consent was obtained after describing risk and benefits of the procedure with the patient, this includes bleeding, bruising, infection, paralysis and medication side effects.  The patient wishes to proceed and has given written consent.  Patient was placed in prone position.  The lumbar area was marked and prepped with Betadine.  It was entered with a 25-gauge 1-1/2 inch needle and one mL of 1% lidocaine was injected into the skin and subcutaneous tissue.  Then a 22-gauge 3.5in spinal needle was inserted into the Left L3-4 intervertebral foramen under AP, lateral, and oblique view.  Then a solution containing one mL of 10 mg per mL dexamethasone and 2 mL of 1% lidocaine was injected.  The patient tolerated procedure well.  Post procedure instructions were given.  Please see post procedure form.

## 2016-06-01 NOTE — Patient Instructions (Signed)

## 2016-06-01 NOTE — Progress Notes (Signed)
  PROCEDURE RECORD [facility]   Name: Gary Clark DOB:03/05/1971 MRN: [id number]  Date:06/01/2016  Physician: Claudette LawsAndrew Kirsteins, MD    Nurse/CMA: Jahki Witham, CMA  Allergies: No Known Allergies  Consent Signed: Yes.    Is patient diabetic? No.  CBG today?   Pregnant: No. LMP: No LMP for male patient. (age 46-55)  Anticoagulants: no Anti-inflammatory: no Antibiotics: no  Procedure: left L3-4 transforaminal epidural steroid injection  Position: Prone Start Time: 9:42am  End Time: 9:47am  Fluoro Time: 23  RN/CMA [name], CMA [name], CMA    Time 9:30am 9:55am    BP 132/80 132/83    Pulse 97 93    Respirations 14 14    O2 Sat 98 98    S/S 6 6    Pain Level 4/10 4/10     D/C home with Dad, patient A & O X 3, D/C instructions reviewed, and sits independently.

## 2016-06-11 ENCOUNTER — Telehealth: Payer: Self-pay | Admitting: Physical Medicine & Rehabilitation

## 2016-06-11 NOTE — Telephone Encounter (Signed)
We sometimes need to do up to 3 epidural injections to get full effect. Would recommend repeat injection in 2 weeks. Please schedule

## 2016-06-11 NOTE — Telephone Encounter (Signed)
Patient received injection a couple of weeks ago and it didn't last but a day.  Would like to know what else he can do.  Please call patient.

## 2016-06-16 NOTE — Progress Notes (Signed)
HPI:  Gary Clark is a pleasant 45 year old here for an acute visit for several issues. The main reason is that he has been experiencing some erectile dysfunction. He has been under a lot of stress recently and he has noticed that he intermittently is not able to maintain an erection. He also has had difficulty with insomnia more so than usual. Poor sleep hygiene. He notices that he gets hot at night and sometimes during the day and will get sweaty. He wants to check a thyroid level. No fevers, malaise, cough, unexplained weight loss, polyuria, urinary symptoms, worse in question, suicidal ideation or panic attacks. He takes Celexa for anxiety and depression. He does not want to take a sleep aid. He is concerned about side effects with erectile dysfunction medications. He did have a shot in his back about a month ago and wonders if this could be related to the erectile dysfunction.    ROS:  See pertinent positives and negatives per HPI.  Past Medical History:  Diagnosis Date  . Allergy   . Arthritis   . Asthma   . Back pain   . Blood in stool   . Cluster headache   . Depression   . Headache(784.0)    frequent   . Hyperlipidemia   . Hypertension   . Lumbosacral spondylosis without myelopathy 06/15/2013  . Migraines   . Postlaminectomy syndrome, lumbar region 03/08/2015   Status post L4-L5 decompression and fusion, anterior/posterior in 2011   . Stroke [facility])    pt states "mini-Stroke"  . UTI (urinary tract infection)     Past Surgical History:  Procedure Laterality Date  . ANTERIOR AND POSTERIOR SPINAL FUSION  2011  . BACK SURGERY    . LUMBAR LAMINECTOMY/DECOMPRESSION MICRODISCECTOMY  2010    Family History  Problem Relation Age of Onset  . Heart disease Mother   . Hypertension Mother   . Arthritis Father   . Hypertension Father   . Prostate cancer Father   . Miscarriages / Stillbirths Maternal Grandmother   . Heart disease Maternal Grandfather   . Sudden death Maternal  Grandfather   . Breast cancer Sister   . Migraines Sister     Social History   Social History  . Marital status: Married    Spouse name: [name]  . Number of children: 1  . Years of education: 12th   Occupational History  .      n/a   Social History Main Topics  . Smoking status: Never Smoker  . Smokeless tobacco: Never Used  . Alcohol use Yes     Comment: once a month  . Drug use: No  . Sexual activity: Yes    Birth control/ protection: None   Other Topics Concern  . None   Social History Narrative   Patient lives at home with his family.   Caffeine Use: 2 cups daily     Current Outpatient Prescriptions:  .  acetaminophen (TYLENOL) 500 MG tablet, Take 1,500 mg by mouth every 4 (four) hours. , Disp: , Rfl:  .  citalopram (CELEXA) 40 MG tablet, Take 1 tablet (40 mg total) by mouth daily., Disp: 90 tablet, Rfl: 3 .  gabapentin (NEURONTIN) 400 MG capsule, Take 1 capsule (400 mg total) by mouth 3 (three) times daily., Disp: 90 capsule, Rfl: 2 .  hydrochlorothiazide (HYDRODIURIL) 25 MG tablet, Take 1 tablet (25 mg total) by mouth daily., Disp: 30 tablet, Rfl: 0 .  ibuprofen (ADVIL,MOTRIN) 200 MG tablet, Take 800 mg by  mouth every 6 (six) hours as needed (pain). , Disp: , Rfl:  .  lisinopril (PRINIVIL,ZESTRIL) 20 MG tablet, Take 1.5 tablets (30 mg total) by mouth daily., Disp: 135 tablet, Rfl: 3 .  meloxicam (MOBIC) 15 MG tablet, Take 1 tablet (15 mg total) by mouth daily., Disp: 30 tablet, Rfl: 1 .  metFORMIN (GLUCOPHAGE) 500 MG tablet, Take 1 tablet (500 mg total) by mouth 2 (two) times daily with a meal., Disp: 180 tablet, Rfl: 3 .  rosuvastatin (CRESTOR) 40 MG tablet, Take 1 tablet (40 mg total) by mouth daily., Disp: 90 tablet, Rfl: 3  EXAM:  Vitals:   06/18/16 0838  BP: 118/84  Pulse: 97  Temp: 98.6 F (37 C)    Body mass index is 35.12 kg/m.  GENERAL: vitals reviewed and listed above, alert, oriented, appears well hydrated and in no acute distress  HEENT:  atraumatic, conjunttiva clear, no obvious abnormalities on inspection of external nose and ears  NECK: no obvious masses on inspection  LUNGS: clear to auscultation bilaterally, no wheezes, rales or rhonchi, good air movement  CV: HRRR, no peripheral edema  MS: moves all extremities without noticeable abnormality  PSYCH: pleasant and cooperative, no obvious depression or anxiety  ASSESSMENT AND PLAN:  Discussed the following assessment and plan:  Poor sleep  Heat intolerance - Plan: TSH  Erectile dysfunction, unspecified erectile dysfunction type  Encounter for immunization - Plan: Flu Vaccine QUAD 36+ mos IM  -Discussed various options for the treatment of sleep issues,  He has poor hygiene - advised starting with cognitive behavioral therapy and sleep hygiene for the sleep and anxiety and attenuation of his Celexa -Discussed options for treatment of erectile dysfunction and further evaluation; he plans to consider -Check thyroid per his request -Follow up in 3 months or sooner as needed -Flu vaccine today -Patient advised to return or notify a doctor immediately if symptoms worsen or persist or new concerns arise.  Patient Instructions  BEFORE YOU LEAVE: -follow up: in 3 months -? Flu shot -lab for thyroid check -counseling brochure  Consider counseling to reduce stress.  FOR IMPROVED SLEEP AND TO RESET YOUR SLEEP SCHEDULE: []  exercise 30 minutes daily  []  go to bed and wake up at the same time  []  keep bedroom cool, dark and quiet  []  reserve bed for sleep - do not read, watch TV, etc in bed  []  If you toss and turn more then 15-20 minutes get out of bed and list thoughts/do quite activity then go back to bed; repeat as needed; do not worry about when you eventually fall asleep - still get up at the same time and turn on lights and take shower  [] get counseling  []  some people find that a half dose of benadryl, melatonin, tylenol pm or unisom on a few nights  per week is helpful initially for a few weeks  [] seek help and treat any depression or anxiety  [] prescription strength sleep medications should only be used in severe cases of insomnia if other measures fail and should be used sparingly        Kriste BasqueKIM, HANNAH R., DO

## 2016-06-18 ENCOUNTER — Encounter: Payer: Self-pay | Admitting: Family Medicine

## 2016-06-18 ENCOUNTER — Ambulatory Visit (INDEPENDENT_AMBULATORY_CARE_PROVIDER_SITE_OTHER): Payer: Commercial Managed Care - HMO | Admitting: Family Medicine

## 2016-06-18 VITALS — BP 118/84 | HR 97 | Temp 98.6°F | Ht 66.0 in | Wt 217.6 lb

## 2016-06-18 DIAGNOSIS — Z72820 Sleep deprivation: Secondary | ICD-10-CM

## 2016-06-18 DIAGNOSIS — R6889 Other general symptoms and signs: Secondary | ICD-10-CM | POA: Diagnosis not present

## 2016-06-18 DIAGNOSIS — Z23 Encounter for immunization: Secondary | ICD-10-CM

## 2016-06-18 DIAGNOSIS — N529 Male erectile dysfunction, unspecified: Secondary | ICD-10-CM

## 2016-06-18 LAB — TSH: TSH: 1.07 u[IU]/mL (ref 0.35–4.50)

## 2016-06-18 NOTE — Progress Notes (Signed)
Pre visit review using our clinic review tool, if applicable. No additional management support is needed unless otherwise documented below in the visit note. 

## 2016-06-18 NOTE — Patient Instructions (Addendum)
BEFORE YOU LEAVE: -follow up: in 3 months -? Flu shot -lab for thyroid check -counseling brochure  Consider counseling to reduce stress.  FOR IMPROVED SLEEP AND TO RESET YOUR SLEEP SCHEDULE: []  exercise 30 minutes daily  []  go to bed and wake up at the same time  []  keep bedroom cool, dark and quiet  []  reserve bed for sleep - do not read, watch TV, etc in bed  []  If you toss and turn more then 15-20 minutes get out of bed and list thoughts/do quite activity then go back to bed; repeat as needed; do not worry about when you eventually fall asleep - still get up at the same time and turn on lights and take shower  [] get counseling  []  some people find that a half dose of benadryl, melatonin, tylenol pm or unisom on a few nights per week is helpful initially for a few weeks  [] seek help and treat any depression or anxiety  [] prescription strength sleep medications should only be used in severe cases of insomnia if other measures fail and should be used sparingly

## 2016-06-19 ENCOUNTER — Telehealth: Payer: Self-pay | Admitting: Family Medicine

## 2016-06-19 NOTE — Telephone Encounter (Signed)
Patient called to advise that he would like for you to go ahead with prescribing the medication that you spoke to him about yesterday

## 2016-06-22 ENCOUNTER — Telehealth: Payer: Self-pay | Admitting: Family Medicine

## 2016-06-22 MED ORDER — SILDENAFIL CITRATE 20 MG PO TABS: ORAL_TABLET | ORAL | 0 refills | Status: DC

## 2016-06-22 MED ORDER — SILDENAFIL CITRATE 50 MG PO TABS: 50.0000 mg | ORAL_TABLET | Freq: Every day | ORAL | 0 refills | Status: DC | PRN

## 2016-06-22 NOTE — Telephone Encounter (Signed)
Please see if he would like need [place] [facility] [place], or to try a small amount at a local pharmacy. Thanks.

## 2016-06-22 NOTE — Telephone Encounter (Signed)
I called the pt and informed him of the message below and advised [facility] call [facility] for prices.

## 2016-06-22 NOTE — Telephone Encounter (Signed)
Sent viagra rx to his pharmacy. Either med is about the same from what pt report to me. [facility] then elsewhere - so usually go with it and then do mail order rx if he finds it useful.

## 2016-06-22 NOTE — Telephone Encounter (Signed)
I called the pt and he stated he was reading about these medications and wanted to know if Dr Selena BattenKim would recommend Cialis or Viagra and to send either to his local pharmacy.  Pt asked that he be called back regarding this message.

## 2016-06-22 NOTE — Addendum Note (Signed)
Addended by: Kriste BasqueKIM, HANNAH R on: 06/22/2016 03:00 PM   Modules accepted: Orders

## 2016-06-22 NOTE — Telephone Encounter (Signed)
Pt is returning joann call °

## 2016-06-22 NOTE — Telephone Encounter (Signed)
rx sent. Let him know they only have 20mg  tablet. Advise 2-3 tablets as needed. dont take more then one dose in 24 hours.

## 2016-06-22 NOTE — Addendum Note (Signed)
Addended by: Terressa KoyanagiKIM, Kian Gamarra R on: 06/22/2016 12:32 PM   Modules accepted: Orders

## 2016-06-22 NOTE — Telephone Encounter (Signed)
I called the pt and informed him of the message below and [facility].  Patient now [facility].

## 2016-06-25 ENCOUNTER — Encounter: Payer: Self-pay | Admitting: Physical Medicine & Rehabilitation

## 2016-06-25 ENCOUNTER — Ambulatory Visit (HOSPITAL_BASED_OUTPATIENT_CLINIC_OR_DEPARTMENT_OTHER): Payer: Commercial Managed Care - HMO | Admitting: Physical Medicine & Rehabilitation

## 2016-06-25 ENCOUNTER — Encounter: Payer: Commercial Managed Care - HMO | Attending: Physical Medicine & Rehabilitation

## 2016-06-25 VITALS — BP 126/87 | HR 91 | Resp 14

## 2016-06-25 DIAGNOSIS — Z981 Arthrodesis status: Secondary | ICD-10-CM | POA: Diagnosis not present

## 2016-06-25 DIAGNOSIS — M533 Sacrococcygeal disorders, not elsewhere classified: Secondary | ICD-10-CM | POA: Insufficient documentation

## 2016-06-25 DIAGNOSIS — E669 Obesity, unspecified: Secondary | ICD-10-CM | POA: Diagnosis not present

## 2016-06-25 DIAGNOSIS — I10 Essential (primary) hypertension: Secondary | ICD-10-CM | POA: Insufficient documentation

## 2016-06-25 DIAGNOSIS — E785 Hyperlipidemia, unspecified: Secondary | ICD-10-CM | POA: Diagnosis not present

## 2016-06-25 DIAGNOSIS — M5417 Radiculopathy, lumbosacral region: Secondary | ICD-10-CM | POA: Diagnosis not present

## 2016-06-25 DIAGNOSIS — M545 Low back pain: Secondary | ICD-10-CM | POA: Insufficient documentation

## 2016-06-25 DIAGNOSIS — M961 Postlaminectomy syndrome, not elsewhere classified: Secondary | ICD-10-CM | POA: Insufficient documentation

## 2016-06-25 DIAGNOSIS — G8929 Other chronic pain: Secondary | ICD-10-CM | POA: Diagnosis not present

## 2016-06-25 DIAGNOSIS — M5416 Radiculopathy, lumbar region: Secondary | ICD-10-CM | POA: Insufficient documentation

## 2016-06-25 DIAGNOSIS — F3340 Major depressive disorder, recurrent, in remission, unspecified: Secondary | ICD-10-CM | POA: Diagnosis not present

## 2016-06-25 NOTE — Progress Notes (Signed)
Subjective:    Patient ID: Gary DrumJerry D Yokley, male    DOB: 1971/04/23, 45 y.o.   MRN: [id number]  HPI  Pain Inventory Average Pain 5 Pain Right Now 6 My pain is constant, burning, stabbing, tingling and aching  In the last 24 hours, has pain interfered with the following? General activity 5 Relation with others 5 Enjoyment of life 8 What TIME of day is your pain at its worst? Morning, night Sleep (in general) Fair  Pain is worse with: walking, inactivity and standing Pain improves with: none Relief from Meds: none  Mobility walk without assistance how many minutes can you walk? 15 minutes ability to climb steps?  yes do you drive?  yes  Function not employed: date last employed 2010 Do you have any goals in this area?  yes  Neuro/Psych numbness tingling depression anxiety  Prior Studies Any changes since last visit?  no  Physicians involved in your care Any changes since last visit?  no   Family History  Problem Relation Age of Onset  . Heart disease Mother   . Hypertension Mother   . Arthritis Father   . Hypertension Father   . Prostate cancer Father   . Miscarriages / Stillbirths Maternal Grandmother   . Heart disease Maternal Grandfather   . Sudden death Maternal Grandfather   . Breast cancer Sister   . Migraines Sister    Social History   Social History  . Marital status: Married    Spouse name: [name]  . Number of children: 1  . Years of education: 12th   Occupational History  .      n/a   Social History Main Topics  . Smoking status: Never Smoker  . Smokeless tobacco: Never Used  . Alcohol use Yes     Comment: once a month  . Drug use: No  . Sexual activity: Yes    Birth control/ protection: None   Other Topics Concern  . None   Social History Narrative   Patient lives at home with his family.   Caffeine Use: 2 cups daily   Past Surgical History:  Procedure Laterality Date  . ANTERIOR AND POSTERIOR SPINAL FUSION  2011  .  BACK SURGERY    . LUMBAR LAMINECTOMY/DECOMPRESSION MICRODISCECTOMY  2010   Past Medical History:  Diagnosis Date  . Allergy   . Arthritis   . Asthma   . Back pain   . Blood in stool   . Cluster headache   . Depression   . Headache(784.0)    frequent   . Hyperlipidemia   . Hypertension   . Lumbosacral spondylosis without myelopathy 06/15/2013  . Migraines   . Postlaminectomy syndrome, lumbar region 03/08/2015   Status post L4-L5 decompression and fusion, anterior/posterior in 2011   . Stroke [facility])    pt states "mini-Stroke"  . UTI (urinary tract infection)    BP 126/87   Pulse 91   Resp 14   SpO2 97%   Opioid Risk Score:   Fall Risk Score:  `1  Depression screen PHQ 2/9  [facility] 05/19/2016 06/10/2015 04/09/2015  Decreased Interest 0 2 0  Down, Depressed, Hopeless 0 1 0  PHQ - 2 Score 0 3 0  Altered sleeping - 2 -  Tired, decreased energy - 2 -  Change in appetite - 2 -  Feeling bad or failure about yourself  - 1 -  Trouble concentrating - 2 -  Moving slowly or fidgety/restless -  0 -  Suicidal thoughts - 0 -  PHQ-9 Score - 12 -  Difficult doing work/chores - Somewhat difficult -      Review of Systems     Objective:   Physical Exam        Assessment & Plan:   Lumbar Left L3-4 translaminar epidural steroid injection under fluoroscopic guidance  Indication: Lumbosacral radiculitis is not relieved by medication management or other conservative care and interfering with self-care and mobility.  No  anticoagulant use.  Informed consent was obtained after describing risk and benefits of the procedure with the patient, this includes bleeding, bruising, infection, paralysis and medication side effects.  The patient wishes to proceed and has given written consent.  Patient was placed in a prone position.  The lumbar area was marked and prepped with Betadine.  It was entered with a 25-gauge 1-1/2 inch needle and one mL of 1% lidocaine was injected  into the skin and subcutaneous tissue.  Then a 17-gauge spinal needle was inserted under fluoroscopic guidance into the L3-4 interlaminar space under AP and Lateral imaging.  Once needle tip of approximated the posterior elements, a loss of resistance technique was utilized with lateral imaging.  A positive loss of resistance was obtained and then confirmed by injecting 2 mL's of Omnipaque 180.  Then a solution containing 1.5 mL's of 6mg /ml Celestone and 1.5 mL's of 1% lidocaine was injected.  The patient tolerated procedure well.  Post procedure instructions were given.  Please see post procedure form.

## 2016-06-25 NOTE — Patient Instructions (Signed)

## 2016-06-25 NOTE — Progress Notes (Signed)
  PROCEDURE RECORD [name]   Name: Gary Clark DOB:10/19/1970 MRN: [id number]  Date:06/25/2016  Physician: Claudette LawsAndrew Kirsteins, MD    Nurse/CMA: Shumaker RN  Allergies: No Known Allergies  Consent Signed: Yes.    Is patient diabetic? Yes.    CBG today?  Did not check, knows steroid will elevate blood sugar  Pregnant: No. LMP: No LMP for male patient. (age 46-55)  Anticoagulants: no Anti-inflammatory: no Antibiotics: no  Procedure: Lumbar translaminar epidural steroid injection Position: Prone Start Time: 10:02 End Time: 10:10 Fluoro Time: 15 seconds  RN/CMA Haematologisthumaker RN Shumaker RN    Time 9:07 10:15    BP 126/87 123/76    Pulse 91 96    Respirations 14 14    O2 Sat 97 97    S/S 6 6    Pain Level 5/10 5/10     D/C home with father, patient A & O X 3, D/C instructions reviewed, and sits independently.

## 2016-06-29 ENCOUNTER — Ambulatory Visit: Payer: Commercial Managed Care - HMO | Admitting: Physical Medicine & Rehabilitation

## 2016-07-08 NOTE — Progress Notes (Signed)
Subjective:   Gary Clark is a 45 y.o. male who presents for Medicare Annual/Subsequent preventive examination.   The Patient was informed that the wellness visit is to identify future health risk and educate and initiate measures that can reduce risk for increased disease through the lifespan.    NO ROS; Medicare Wellness Visit Patient lives at home with his family. Wife works with dev kids Son 12 yo has CP and disabled; lives at home Good relationship with father  Mother just dx with breast cancer  Caffeine Use: 2 cups daily  Describes health as good, fair or great?  Fair; feeling good;  Ongoing back issues; still has pain and treated by pain management  If up to much, loses sensation to left side / leg   Preventive Screening -Counseling & Management   Current smoking/ tobacco status: neg ETOH one time per month / occasionally but rare  RISK FACTORS Regular exercise / inhibited by numbness; Left side will get numb  When trying to walk and going to the pool can tolerate brief time prior to numbness  Falls are an ongoing risk   Diet Banana at breakfast Skips lunch Drinks a lot of water Dinner is a complete meal; no fat;grilled food and vegetables;  Fall risk seeing back specialist; pain specialist; given another injection; Can't do anymore; Neurosurgeon;  D/a getting hurt at work;   Mobility of Functional changes this year? Post laminectomy 2016   Cardiac Risk Factors:  Advanced aged > 55 in men; Hyperlipidemia/ chol 234; Trig 152; HDL 49; LDL 154  Family History (mother hd; htn; father had prostate cancer; sister breast cancer;  MGF sudden death)  HTN; Bp mod elevated but needs refill hctz Obesity bMI 35 ; due to pain and numbness  A1c 5.9; pre-diabetes (fbs 92; has been > 115;   Depression Screen PhQ 2: negative  Activities of Daily Living - See functional screen   Hearing Difficulty: normal  Ophthalmology Exam: eye exam x 1 year ago Not sure if he  goes to [facility] [employer]   Recommend q year;   Cognitive testing; Ad8 score; 0 or less than 2  MMSE deferred or completed if AD8 + 2 issues  Advanced Directives: no declined  Did agree to take [facility] form as a guide for information only for HCPOA   List the name of Physicians or other Practitioners you currently use:   Immunization History  Administered Date(s) Administered  . Influenza Split 06/10/2012  . Influenza,inj,Quad PF,36+ Mos 06/12/2013, 06/03/2015, 06/18/2016  . Tdap 01/01/2015   Required Immunizations needed today  Screening test up to date or reviewed for plan of completion There are no preventive care reminders to display for this patient.   The following information was reviewed  Allergies; Medications; Past Medical Hx; Problem list; Surgical hx; Family hx; Social Hx   Cardiac Risk Factors include: advanced age (>64men, >41 women);dyslipidemia;family history of premature cardiovascular disease;hypertension;male gender     Objective:    Vitals: BP 140/90   Pulse 95   Ht 5' 5.5" (1.664 m)   Wt 216 lb 4 oz (98.1 kg)   SpO2 98%   BMI 35.44 kg/m   Body mass index is 35.44 kg/m.  States HCTZ needs refilled  Tobacco History  Smoking Status  . Current Every Day Smoker  Smokeless Tobacco  . Never Used     Ready to quit: Not Answered Counseling given: Yes   Past Medical History:  Diagnosis Date  . Allergy   .  Arthritis   . Asthma   . Back pain   . Blood in stool   . Cluster headache   . Depression   . Headache(784.0)    frequent   . Hyperlipidemia   . Hypertension   . Lumbosacral spondylosis without myelopathy 06/15/2013  . Migraines   . Postlaminectomy syndrome, lumbar region 03/08/2015   Status post L4-L5 decompression and fusion, anterior/posterior in 2011   . Stroke [facility](HCC)    pt states "mini-Stroke"  . UTI (urinary tract infection)    Past Surgical History:  Procedure Laterality Date  . ANTERIOR AND POSTERIOR SPINAL FUSION  2011  .  BACK SURGERY    . LUMBAR LAMINECTOMY/DECOMPRESSION MICRODISCECTOMY  2010   Family History  Problem Relation Age of Onset  . Heart disease Mother   . Hypertension Mother   . Arthritis Father   . Hypertension Father   . Prostate cancer Father   . Miscarriages / Stillbirths Maternal Grandmother   . Heart disease Maternal Grandfather   . Sudden death Maternal Grandfather   . Breast cancer Sister   . Migraines Sister    History  Sexual Activity  . Sexual activity: Yes  . Birth control/ protection: None    Outpatient Encounter Prescriptions as of 07/09/2016  Medication Sig  . acetaminophen (TYLENOL) 500 MG tablet Take 1,500 mg by mouth every 4 (four) hours.   . citalopram (CELEXA) 40 MG tablet Take 1 tablet (40 mg total) by mouth daily.  Marland Kitchen. gabapentin (NEURONTIN) 400 MG capsule Take 1 capsule (400 mg total) by mouth 3 (three) times daily.  . hydrochlorothiazide (HYDRODIURIL) 25 MG tablet Take 1 tablet (25 mg total) by mouth daily.  Marland Kitchen. ibuprofen (ADVIL,MOTRIN) 200 MG tablet Take 800 mg by mouth every 6 (six) hours as needed (pain).   Marland Kitchen. lisinopril (PRINIVIL,ZESTRIL) 20 MG tablet Take 1.5 tablets (30 mg total) by mouth daily.  . meloxicam (MOBIC) 15 MG tablet Take 1 tablet (15 mg total) by mouth daily.  . metFORMIN (GLUCOPHAGE) 500 MG tablet Take 1 tablet (500 mg total) by mouth 2 (two) times daily with a meal.  . rosuvastatin (CRESTOR) 40 MG tablet Take 1 tablet (40 mg total) by mouth daily.  . sildenafil (REVATIO) 20 MG tablet Take 2-5 tablets (40-100mg ) as needed prior to sexual activity. Use lowest effective dose. Do not take more then one dose in 24 hours.   No facility-administered encounter medications on file as of 07/09/2016.     Activities of Daily Living In your present state of health, do you have any difficulty performing the following activities: 07/09/2016  Hearing? N  Vision? N  Difficulty concentrating or making decisions? N  Walking or climbing stairs? Y  Dressing  or bathing? N  Doing errands, shopping? N  Preparing Food and eating ? N  Using the Toilet? N  In the past six months, have you accidently leaked urine? N  Do you have problems with loss of bowel control? N  Managing your Medications? N  Managing your Finances? N  Housekeeping or managing your Housekeeping? N  Some recent data might be hidden    Patient Care Team: Terressa KoyanagiHannah R Kim, DO as PCP - General (Family Medicine)   Assessment:    ASSESSMENT INCLUDED:  Educated regarding prediabetes and numbers;  A1c ranges from 5.8 to 6.5 or fasting Blood sugar > 115 -126; (126 is diabetic)   Risk: >45yo; family hx; overweight or obese; African American; Hispanic; Latino; American [place]; [place] American; Pacific Islander; history  of diabetes when pregnant; or birth to a baby weighing over 9 lbs. Being less physically active than 30 minutes; 3 times a week;   Prevention; Losing a modest 7 to 8 lbs; If over 200 lbs; 10 to 14 lbs;  Choose healthier foods; colorful veggies; fish or lean meats; drinks water Reduce portion size Start exercising; 30 minutes of fast walking x 30 minutes per day/ 60 min for weight loss    Psychosocial risk reviewed as stress; unresolved grief; pain; lack of support; lack of income to buy groceries, meds etc.  Behavioral risk addressed such as tobacco, ETOH; diet (metabolic syndrome) and exercise  All immunizations and overdue screens were reviewed for a plan or follow-up.   Labs were reviewed in regard to Lipids and A1c if appropriate.   Discussed Recommended screenings and documented any personalized health advice and referrals for preventive counseling.  See AVS for patient instructions;   Exercise Activities and Dietary recommendations Current Exercise Habits: Home exercise routine, Type of exercise: walking, Time (Minutes): 30, Frequency (Times/Week): 5, Weekly Exercise (Minutes/Week): 150, Intensity: Mild  Goals    . Weight (lb) < 195 lb (88.5 kg)           Try to eat 3 meals; a day  Balance out calories  Educated regarding prediabetes and numbers;  A1c ranges from 5.8 to 6.5 or fasting Blood sugar > 115 -126; (126 is diabetic)   Risk: >45yo; family hx; overweight or obese; African American; Hispanic; Latino; American [place]; [place] American; [place] Islander; history of diabetes when pregnant; or birth to a baby weighing over 9 lbs. Being less physically active than 30 minutes; 3 times a week;   Prevention; Losing a modest 7 to 8 lbs; If over 200 lbs; 10 to 14 lbs;  Choose healthier foods; colorful veggies; fish or lean meats; drinks water Reduce portion size Start exercising; 30 minutes of fast walking x 30 minutes per day/ 60 min for weight loss   [email or web address]            Fall Risk Fall Risk  07/09/2016 06/25/2016 06/01/2016 05/19/2016 04/21/2016  Falls in the past year? No No No No No  Risk for fall due to : - - - - -   Depression Screen PHQ 2/9 Scores 07/09/2016 05/19/2016 06/10/2015 04/09/2015  PHQ - 2 Score 0 0 3 0  PHQ- 9 Score - - 12 -    Cognitive Function MMSE - Mini Mental State Exam 07/09/2016  Not completed: (No Data)    Ad8 score 0     Immunization History  Administered Date(s) Administered  . Influenza Split 06/10/2012  . Influenza,inj,Quad PF,36+ Mos 06/12/2013, 06/03/2015, 06/18/2016  . Tdap 01/01/2015   Screening Tests Health Maintenance  Topic Date Due  . HIV Screening  12/31/2024 (Originally 02/07/1986)  . TETANUS/TDAP  12/31/2024  . INFLUENZA VACCINE  Completed      Plan:     Educated on prediabetes Educated on heart health diet and eating 3 times a day for best weight loss results   Recommended heart health diet plan and eating regular meals  Will try to lose 15 lbs   Also will refill HCTZ since he seen Dr. Selena Batten to review HTN; 04/2016   During the course of the visit the patient was educated and counseled about the following appropriate screening and preventive services:   Vaccines to  include Pneumoccal, Influenza, Hepatitis B, Td, Zostavax, HCV  Electrocardiogram  Cardiovascular Disease  Colorectal cancer screening  Diabetes screening  Prostate Cancer Screening  Glaucoma screening  Nutrition counseling   Smoking cessation counseling  Patient Instructions (the written plan) was given to the patient.    Montine Circle, RN  07/09/2016  Terressa Koyanagi., DO

## 2016-07-09 ENCOUNTER — Ambulatory Visit (INDEPENDENT_AMBULATORY_CARE_PROVIDER_SITE_OTHER): Payer: Commercial Managed Care - HMO

## 2016-07-09 VITALS — BP 140/90 | HR 95 | Ht 65.5 in | Wt 216.2 lb

## 2016-07-09 DIAGNOSIS — Z0000 Encounter for general adult medical examination without abnormal findings: Secondary | ICD-10-CM

## 2016-07-09 MED ORDER — HYDROCHLOROTHIAZIDE 25 MG PO TABS: 25.0000 mg | ORAL_TABLET | Freq: Every day | ORAL | 1 refills | Status: DC

## 2016-07-09 NOTE — Patient Instructions (Addendum)
[name] , Thank you for taking time to come for your Medicare Wellness Visit. I appreciate your ongoing commitment to your health goals. Please review the following plan we discussed and let me know if I can assist you in the future.  Will have eye exam soon  Will continue heart healthy diet Fat free or low fat dairy products Fish high in omega-3 acids ( salmon, tuna, trout) Fruits, such as apples, bananas, oranges, pears, prunes Legumes, such as kidney beans, lentils, checkpeas, black-eyed peas and lima beans Vegetables; broccoli, cabbage, carrots Whole grains;   Plant fats are better; decrease "white" foods as pasta, rice, bread and desserts, sugar; Avoid red meat (limiting) palm and coconut oils; sugary foods and beverages  Two nutrients that raise blood chol levels are saturated fats and trans fat; in hydrogenated oils and fats, as stick margarine, baked goods (cookes, cakes, pies, crackers; frosting; and coffee creamers;   Some Fats lower cholesterol: Monounsaturated and polyunsaturated  Avocados Corn, sunflower, and soybean oils Nuts and seeds, such as walnuts Olive, canola, peanut, safflower, and sesame oils Peanut butter Salmon and trout Tofu     These are the goals we discussed: Goals    . Weight (lb) < 195 lb (88.5 kg)          Try to eat 3 meals; a day  Balance out calories  Educated regarding prediabetes and numbers;  A1c ranges from 5.8 to 6.5 or fasting Blood sugar > 115 -126; (126 is diabetic)   Risk: >45yo; family hx; overweight or obese; African American; Hispanic; Latino; American [place]; [place] American; [place] Islander; history of diabetes when pregnant; or birth to a baby weighing over 9 lbs. Being less physically active than 30 minutes; 3 times a week;   Prevention; Losing a modest 7 to 8 lbs; If over 200 lbs; 10 to 14 lbs;  Choose healthier foods; colorful veggies; fish or lean meats; drinks water Reduce portion size Start exercising; 30 minutes of  fast walking x 30 minutes per day/ 60 min for weight loss   [email or web address]             This is a list of the screening recommended for you and due dates:  Health Maintenance  Topic Date Due  . HIV Screening  12/31/2024*  . Tetanus Vaccine  12/31/2024  . Flu Shot  Completed  *Topic was postponed. The date shown is not the original due date.     Screening for Type 2 Diabetes Screening is a way to check for type 2 diabetes in people who do not have symptoms of the disease, but who may likely develop diabetes in the future. Diabetes can lead to serious health problems, but finding diabetes early allows for early treatment. DIABETES RISK FACTORS   Family history of diabetes.  Diseases of the pancreas.  Obesity or being overweight.  Certain racial or ethnic groups:  American [place].  Pacific Islander.  Hispanic.  Asian.  African American.  High blood pressure (hypertension).  History of diabetes while pregnant (gestational diabetes).  Delivering a baby that weighed over 9 pounds.  Being inactive.  High cholesterol or triglycerides.  Age, especially over 31 years of age.  Other diseases or conditions.  Diseases of the pancreas.  Cardiovascular disease.  Disorders of the endocrine system.  Certain medicines, such as those that treat high blood cholesterol levels. WHO IS SCREENED Adults  Adults who have no risk factors and no symptoms should be screened starting at age 50. If  the screening tests are normal, they should be repeated every 3 years.  Adults who do not have symptoms, but have 1 or more risk factors, should be screened.  Adults who have 2 or more risk factors may be screened every year.  Adults who have an A1c (3 month average of blood glucose) greater than 5.7% or who had an impaired glucose tolerance (IGT) or impaired fasting glucose (IFG) on a previous test should be screened.  Pregnant women who have risk factors should be screened at  their first prenatal visit.  Women who have given birth and had gestational diabetes should be screened 6-12 weeks after the child is born. This screening should be repeated every 1-3 years after the first test. Children or Adolescents  Children and adolescents should be screened for type 2 diabetes if they are overweight and have 2 of the following risk factors:  Having a family history of type 2 diabetes.  Being a member of a high risk race or ethnic group.  Having signs of insulin resistance or conditions associated with insulin resistance.  Having a mother who had gestational diabetes while pregnant with him or her.  Screening should start at age 66 or at the onset of puberty, whichever comes first. This should be repeated every 2 years. SCREENING In a screening, your caregiver may:  Ask questions about your overall health. This will include questions about the health of close family members, too.  Ask about any diabetes-like symptoms you may have.  Perform a physical exam.  Order some tests that may include:  A fasting plasma glucose test. This measures the level of glucose in your blood. It is done after you have had nothing to eat but water (fasted) for 8 hours.  A random blood glucose test. This test is done without the need to fast.  An oral glucose tolerance test. This is a blood test done in 2 parts. First, a blood sample is taken after you have fasted. Then, another sample is taken after you drink a liquid that contains a lot of sugar.  An A1c test. This test shows how much glucose has been in your blood over the past 2 to 3 months.   This information is not intended to replace advice given to you by your health care provider. Make sure you discuss any questions you have with your health care provider.   Document Released: 06/27/2009 Document Revised: 09/21/2014 Document Reviewed: 04/08/2011 Elsevier Interactive Patient Education 2016 [employer].   Fall  Prevention in the Home  Falls can cause injuries. They can happen to people of all ages. There are many things you can do to make your home safe and to help prevent falls.  WHAT CAN I DO ON THE OUTSIDE OF MY HOME?  Regularly fix the edges of walkways and driveways and fix any cracks.  Remove anything that might make you trip as you walk through a door, such as a raised step or threshold.  Trim any bushes or trees on the path to your home.  Use bright outdoor lighting.  Clear any walking paths of anything that might make someone trip, such as rocks or tools.  Regularly check to see if handrails are loose or broken. Make sure that both sides of any steps have handrails.  Any raised decks and porches should have guardrails on the edges.  Have any leaves, snow, or ice cleared regularly.  Use sand or salt on walking paths during winter.  Clean up any  spills in your garage right away. This includes oil or grease spills. WHAT CAN I DO IN THE BATHROOM?   Use night lights.  Install grab bars by the toilet and in the tub and shower. Do not use towel bars as grab bars.  Use non-skid mats or decals in the tub or shower.  If you need to sit down in the shower, use a plastic, non-slip stool.  Keep the floor dry. Clean up any water that spills on the floor as soon as it happens.  Remove soap buildup in the tub or shower regularly.  Attach bath mats securely with double-sided non-slip rug tape.  Do not have throw rugs and other things on the floor that can make you trip. WHAT CAN I DO IN THE BEDROOM?  Use night lights.  Make sure that you have a light by your bed that is easy to reach.  Do not use any sheets or blankets that are too big for your bed. They should not hang down onto the floor.  Have a firm chair that has side arms. You can use this for support while you get dressed.  Do not have throw rugs and other things on the floor that can make you trip. WHAT CAN I DO IN THE  KITCHEN?  Clean up any spills right away.  Avoid walking on wet floors.  Keep items that you use a lot in easy-to-reach places.  If you need to reach something above you, use a strong step stool that has a grab bar.  Keep electrical cords out of the way.  Do not use floor polish or wax that makes floors slippery. If you must use wax, use non-skid floor wax.  Do not have throw rugs and other things on the floor that can make you trip. WHAT CAN I DO WITH MY STAIRS?  Do not leave any items on the stairs.  Make sure that there are handrails on both sides of the stairs and use them. Fix handrails that are broken or loose. Make sure that handrails are as long as the stairways.  Check any carpeting to make sure that it is firmly attached to the stairs. Fix any carpet that is loose or worn.  Avoid having throw rugs at the top or bottom of the stairs. If you do have throw rugs, attach them to the floor with carpet tape.  Make sure that you have a light switch at the top of the stairs and the bottom of the stairs. If you do not have them, ask someone to add them for you. WHAT ELSE CAN I DO TO HELP PREVENT FALLS?  Wear shoes that:  Do not have high heels.  Have rubber bottoms.  Are comfortable and fit you well.  Are closed at the toe. Do not wear sandals.  If you use a stepladder:  Make sure that it is fully opened. Do not climb a closed stepladder.  Make sure that both sides of the stepladder are locked into place.  Ask someone to hold it for you, if possible.  Clearly mark and make sure that you can see:  Any grab bars or handrails.  First and last steps.  Where the edge of each step is.  Use tools that help you move around (mobility aids) if they are needed. These include:  Canes.  Walkers.  Scooters.  Crutches.  Turn on the lights when you go into a dark area. Replace any light bulbs as soon as they burn  out.  Set up your furniture so you have a clear path.  Avoid moving your furniture around.  If any of your floors are uneven, fix them.  If there are any pets around you, be aware of where they are.  Review your medicines with your doctor. Some medicines can make you feel dizzy. This can increase your chance of falling. Ask your doctor what other things that you can do to help prevent falls.   This information is not intended to replace advice given to you by your health care provider. Make sure you discuss any questions you have with your health care provider.   Document Released: 06/27/2009 Document Revised: 01/15/2015 Document Reviewed: 10/05/2014 Elsevier Interactive Patient Education 2016 [employer].  Health Maintenance, Male A healthy lifestyle and preventative care can promote health and wellness.  Maintain regular health, dental, and eye exams.  Eat a healthy diet. Foods like vegetables, fruits, whole grains, low-fat dairy products, and lean protein foods contain the nutrients you need and are low in calories. Decrease your intake of foods high in solid fats, added sugars, and salt. Get information about a proper diet from your health care provider, if necessary.  Regular physical exercise is one of the most important things you can do for your health. Most adults should get at least 150 minutes of moderate-intensity exercise (any activity that increases your heart rate and causes you to sweat) each week. In addition, most adults need muscle-strengthening exercises on 2 or more days a week.   Maintain a healthy weight. The body mass index (BMI) is a screening tool to identify possible weight problems. It provides an estimate of body fat based on height and weight. Your health care provider can find your BMI and can help you achieve or maintain a healthy weight. For males 20 years and older:  A BMI below 18.5 is considered underweight.  A BMI of 18.5 to 24.9 is normal.  A BMI of 25 to 29.9 is considered overweight.  A BMI of 30  and above is considered obese.  Maintain normal blood lipids and cholesterol by exercising and minimizing your intake of saturated fat. Eat a balanced diet with plenty of fruits and vegetables. Blood tests for lipids and cholesterol should begin at age 1 and be repeated every 5 years. If your lipid or cholesterol levels are high, you are over age 71, or you are at high risk for heart disease, you may need your cholesterol levels checked more frequently.Ongoing high lipid and cholesterol levels should be treated with medicines if diet and exercise are not working.  If you smoke, find out from your health care provider how to quit. If you do not use tobacco, do not start.  Lung cancer screening is recommended for adults aged 55-80 years who are at high risk for developing lung cancer because of a history of smoking. A yearly low-dose CT scan of the lungs is recommended for people who have at least a 30-pack-year history of smoking and are current smokers or have quit within the past 15 years. A pack year of smoking is smoking an average of 1 pack of cigarettes a day for 1 year (for example, a 30-pack-year history of smoking could mean smoking 1 pack a day for 30 years or 2 packs a day for 15 years). Yearly screening should continue until the smoker has stopped smoking for at least 15 years. Yearly screening should be stopped for people who develop a health problem that would prevent  them from having lung cancer treatment.  If you choose to drink alcohol, do not have more than 2 drinks per day. One drink is considered to be 12 oz (360 mL) of beer, 5 oz (150 mL) of wine, or 1.5 oz (45 mL) of liquor.  Avoid the use of street drugs. Do not share needles with anyone. Ask for help if you need support or instructions about stopping the use of drugs.  High blood pressure causes heart disease and increases the risk of stroke. High blood pressure is more likely to develop in:  People who have blood pressure in  the end of the normal range (100-139/85-89 mm Hg).  People who are overweight or obese.  People who are African American.  If you are 11-51 years of age, have your blood pressure checked every 3-5 years. If you are 14 years of age or older, have your blood pressure checked every year. You should have your blood pressure measured twice--once when you are at a hospital or clinic, and once when you are not at a hospital or clinic. Record the average of the two measurements. To check your blood pressure when you are not at a hospital or clinic, you can use:  An automated blood pressure machine at a pharmacy.  A home blood pressure monitor.  If you are 31-92 years old, ask your health care provider if you should take aspirin to prevent heart disease.  Diabetes screening involves taking a blood sample to check your fasting blood sugar level. This should be done once every 3 years after age 63 if you are at a normal weight and without risk factors for diabetes. Testing should be considered at a younger age or be carried out more frequently if you are overweight and have at least 1 risk factor for diabetes.  Colorectal cancer can be detected and often prevented. Most routine colorectal cancer screening begins at the age of 3 and continues through age 33. However, your health care provider may recommend screening at an earlier age if you have risk factors for colon cancer. On a yearly basis, your health care provider may provide home test kits to check for hidden blood in the stool. A small camera at the end of a tube may be used to directly examine the colon (sigmoidoscopy or colonoscopy) to detect the earliest forms of colorectal cancer. Talk to your health care provider about this at age 80 when routine screening begins. A direct exam of the colon should be repeated every 5-10 years through age 5, unless early forms of precancerous polyps or small growths are found.  People who are at an increased risk  for hepatitis B should be screened for this virus. You are considered at high risk for hepatitis B if:  You were born in a country where hepatitis B occurs often. Talk with your health care provider about which countries are considered high risk.  Your parents were born in a high-risk country and you have not received a shot to protect against hepatitis B (hepatitis B vaccine).  You have HIV or AIDS.  You use needles to inject street drugs.  You live with, or have sex with, someone who has hepatitis B.  You are a man who has sex with other men (MSM).  You get hemodialysis treatment.  You take certain medicines for conditions like cancer, organ transplantation, and autoimmune conditions.  Hepatitis C blood testing is recommended for all people born from [birth year] through 1966 and any individual  with known risk factors for hepatitis C.  Healthy men should no longer receive prostate-specific antigen (PSA) blood tests as part of routine cancer screening. Talk to your health care provider about prostate cancer screening.  Testicular cancer screening is not recommended for adolescents or adult males who have no symptoms. Screening includes self-exam, a health care provider exam, and other screening tests. Consult with your health care provider about any symptoms you have or any concerns you have about testicular cancer.  Practice safe sex. Use condoms and avoid high-risk sexual practices to reduce the spread of sexually transmitted infections (STIs).  You should be screened for STIs, including gonorrhea and chlamydia if:  You are sexually active and are younger than 24 years.  You are older than 24 years, and your health care provider tells you that you are at risk for this type of infection.  Your sexual activity has changed since you were last screened, and you are at an increased risk for chlamydia or gonorrhea. Ask your health care provider if you are at risk.  If you are at risk of being  infected with HIV, it is recommended that you take a prescription medicine daily to prevent HIV infection. This is called pre-exposure prophylaxis (PrEP). You are considered at risk if:  You are a man who has sex with other men (MSM).  You are a heterosexual man who is sexually active with multiple partners.  You take drugs by injection.  You are sexually active with a partner who has HIV.  Talk with your health care provider about whether you are at high risk of being infected with HIV. If you choose to begin PrEP, you should first be tested for HIV. You should then be tested every 3 months for as long as you are taking PrEP.  Use sunscreen. Apply sunscreen liberally and repeatedly throughout the day. You should seek shade when your shadow is shorter than you. Protect yourself by wearing long sleeves, pants, a wide-brimmed hat, and sunglasses year round whenever you are outdoors.  Tell your health care provider of new moles or changes in moles, especially if there is a change in shape or color. Also, tell your health care provider if a mole is larger than the size of a pencil eraser.  A one-time screening for abdominal aortic aneurysm (AAA) and surgical repair of large AAAs by ultrasound is recommended for men aged 65-75 years who are current or former smokers.  Stay current with your vaccines (immunizations).   This information is not intended to replace advice given to you by your health care provider. Make sure you discuss any questions you have with your health care provider.   Document Released: 02/27/2008 Document Revised: 09/21/2014 Document Reviewed: 01/26/2011 Elsevier Interactive Patient Education [employer] Elsevier Inc.

## 2016-07-16 ENCOUNTER — Ambulatory Visit: Payer: Commercial Managed Care - HMO | Admitting: Family Medicine

## 2016-07-17 ENCOUNTER — Ambulatory Visit (HOSPITAL_BASED_OUTPATIENT_CLINIC_OR_DEPARTMENT_OTHER): Payer: Commercial Managed Care - HMO | Admitting: Physical Medicine & Rehabilitation

## 2016-07-17 ENCOUNTER — Encounter: Payer: Commercial Managed Care - HMO | Attending: Physical Medicine & Rehabilitation

## 2016-07-17 ENCOUNTER — Encounter: Payer: Self-pay | Admitting: Physical Medicine & Rehabilitation

## 2016-07-17 VITALS — BP 149/86 | HR 85 | Resp 14

## 2016-07-17 DIAGNOSIS — M545 Low back pain: Secondary | ICD-10-CM | POA: Diagnosis not present

## 2016-07-17 DIAGNOSIS — E669 Obesity, unspecified: Secondary | ICD-10-CM | POA: Diagnosis not present

## 2016-07-17 DIAGNOSIS — M5416 Radiculopathy, lumbar region: Secondary | ICD-10-CM | POA: Insufficient documentation

## 2016-07-17 DIAGNOSIS — E785 Hyperlipidemia, unspecified: Secondary | ICD-10-CM | POA: Diagnosis not present

## 2016-07-17 DIAGNOSIS — G8929 Other chronic pain: Secondary | ICD-10-CM | POA: Insufficient documentation

## 2016-07-17 DIAGNOSIS — Z981 Arthrodesis status: Secondary | ICD-10-CM | POA: Insufficient documentation

## 2016-07-17 DIAGNOSIS — M961 Postlaminectomy syndrome, not elsewhere classified: Secondary | ICD-10-CM | POA: Diagnosis not present

## 2016-07-17 DIAGNOSIS — F3340 Major depressive disorder, recurrent, in remission, unspecified: Secondary | ICD-10-CM | POA: Insufficient documentation

## 2016-07-17 DIAGNOSIS — I10 Essential (primary) hypertension: Secondary | ICD-10-CM | POA: Insufficient documentation

## 2016-07-17 DIAGNOSIS — M533 Sacrococcygeal disorders, not elsewhere classified: Secondary | ICD-10-CM | POA: Insufficient documentation

## 2016-07-17 MED ORDER — GABAPENTIN 300 MG PO CAPS: 600.0000 mg | ORAL_CAPSULE | Freq: Three times a day (TID) | ORAL | 1 refills | Status: DC

## 2016-07-17 MED ORDER — GABAPENTIN 400 MG PO CAPS: 400.0000 mg | ORAL_CAPSULE | Freq: Three times a day (TID) | ORAL | 2 refills | Status: DC

## 2016-07-17 NOTE — Patient Instructions (Signed)
We are increasing gabapentin dose to help with pain going into the left leg  Referral [facility]. You should receive a call from the office to schedule an appointment

## 2016-07-17 NOTE — Progress Notes (Signed)
Gary Clark returns today after last visit on 05/19/2016. He has had both lumbar transforaminal epidural steroid injections at L3-4 on 05/19/2016 which resulted in 2 day relief as well as left L3-L4 transforaminal lumbar epidural steroid injection which gave a similar relief short-term. He has prior history of L4-L5 decompressive laminectomy in 2010, followed by L4-L5 anterior and posterior fusion in 2011. 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 As been seen by Dr.Beane from orthopedic surgery in the past. Also has been seen by Dr. Shon BatonBrooks from spine surgery  Examination  Neuro:  Eyes without evidence of nystagmus  Tone is normal without evidence of spasticity Cerebellar exam shows no evidence of ataxia on finger nose finger or heel to shin testing No evidence of trunkal ataxia  Motor strength is 5/5 in bilateral deltoid, biceps, triceps, finger flexors and extensors, wrist flexors and extensors, hip flexors, knee flexors and extensors, ankle dorsiflexors, plantar flexors, invertors and evertors, toe flexors and extensors  Sensory exam is normal to pinprick,  and light touch in the upper and lower limbs   Impression 1. Lumbar postlaminectomy syndrome with chronic postoperative pain. More recently, he started developing left lower extremity radicular pain, which appears to be at the L3-L4 area Repeat lumbar MRI 05/01/2016 showed left posterior lateral disc protrusion encroaching upon the lateral recess at L3-L4. Given lack of benefit from lumbar epidural injections will make referral back to orthopedic spine surgery, [facility].  Patient will follow up with me after he is evaluated by orthopedic spine surgery. No further spinal injections planned at the current time

## 2016-07-23 ENCOUNTER — Telehealth: Payer: Self-pay | Admitting: Family Medicine

## 2016-07-23 NOTE — Telephone Encounter (Signed)
Called and spoke with patient.  His [employer] insurance card lists Dr. Tillman Abideichard Letvak in error.  [facility] will [facility] to [facility] and get PCP changed to Dr. Kriste BasqueHannah Kim.  Patient would like to keep his appointment with specialist [facility] on 08/04/16, however the authorization from his PCP Dr. Selena BattenKim will need to be completed after pt calls [employer].

## 2016-07-24 ENCOUNTER — Emergency Department (HOSPITAL_COMMUNITY): Payer: Commercial Managed Care - HMO

## 2016-07-24 ENCOUNTER — Encounter (HOSPITAL_COMMUNITY): Payer: Self-pay

## 2016-07-24 ENCOUNTER — Emergency Department (HOSPITAL_COMMUNITY)
Admission: EM | Admit: 2016-07-24 | Discharge: 2016-07-24 | Disposition: A | Discharge status: Home / Self Care | Payer: Commercial Managed Care - HMO | Attending: Emergency Medicine | Admitting: Emergency Medicine

## 2016-07-24 DIAGNOSIS — Z8673 Personal history of transient ischemic attack (TIA), and cerebral infarction without residual deficits: Secondary | ICD-10-CM | POA: Insufficient documentation

## 2016-07-24 DIAGNOSIS — Y9301 Activity, walking, marching and hiking: Secondary | ICD-10-CM | POA: Diagnosis not present

## 2016-07-24 DIAGNOSIS — J45909 Unspecified asthma, uncomplicated: Secondary | ICD-10-CM | POA: Insufficient documentation

## 2016-07-24 DIAGNOSIS — S59912A Unspecified injury of left forearm, initial encounter: Secondary | ICD-10-CM | POA: Diagnosis not present

## 2016-07-24 DIAGNOSIS — S6992XA Unspecified injury of left wrist, hand and finger(s), initial encounter: Secondary | ICD-10-CM | POA: Diagnosis not present

## 2016-07-24 DIAGNOSIS — Y999 Unspecified external cause status: Secondary | ICD-10-CM | POA: Insufficient documentation

## 2016-07-24 DIAGNOSIS — S5012XA Contusion of left forearm, initial encounter: Secondary | ICD-10-CM | POA: Diagnosis not present

## 2016-07-24 DIAGNOSIS — Z79899 Other long term (current) drug therapy: Secondary | ICD-10-CM | POA: Insufficient documentation

## 2016-07-24 DIAGNOSIS — W109XXA Fall (on) (from) unspecified stairs and steps, initial encounter: Secondary | ICD-10-CM | POA: Insufficient documentation

## 2016-07-24 DIAGNOSIS — F17200 Nicotine dependence, unspecified, uncomplicated: Secondary | ICD-10-CM | POA: Diagnosis not present

## 2016-07-24 DIAGNOSIS — S40022A Contusion of left upper arm, initial encounter: Secondary | ICD-10-CM

## 2016-07-24 DIAGNOSIS — I10 Essential (primary) hypertension: Secondary | ICD-10-CM | POA: Insufficient documentation

## 2016-07-24 DIAGNOSIS — Z7984 Long term (current) use of oral hypoglycemic drugs: Secondary | ICD-10-CM | POA: Diagnosis not present

## 2016-07-24 DIAGNOSIS — Y9289 Other specified places as the place of occurrence of the external cause: Secondary | ICD-10-CM | POA: Diagnosis not present

## 2016-07-24 DIAGNOSIS — M79632 Pain in left forearm: Secondary | ICD-10-CM | POA: Diagnosis not present

## 2016-07-24 DIAGNOSIS — M25532 Pain in left wrist: Secondary | ICD-10-CM | POA: Diagnosis not present

## 2016-07-24 DIAGNOSIS — W19XXXA Unspecified fall, initial encounter: Secondary | ICD-10-CM

## 2016-07-24 DIAGNOSIS — S59911A Unspecified injury of right forearm, initial encounter: Secondary | ICD-10-CM | POA: Diagnosis present

## 2016-07-24 MED ORDER — HYDROCODONE-ACETAMINOPHEN 5-325 MG PO TABS: 1.0000 | ORAL_TABLET | Freq: Four times a day (QID) | ORAL | 0 refills | Status: DC | PRN

## 2016-07-24 NOTE — ED Triage Notes (Signed)
Pt states walking down steps, slipped and fell. Pt states caught self with L arm. Pt complaining of L wrist and arm pain.

## 2016-07-24 NOTE — ED Provider Notes (Signed)
[facility]-EMERGENCY DEPT Provider Note   CSN: [id number] Arrival date & time: 07/24/16  2022   By signing my name below, I, Ether GriffinsKayla Croutch, attest that this documentation has been prepared under the direction and in the presence of  [facility] M. Damian LeavellNeese, NP. Electronically Signed:Kayla Croutch, ED Scribe. 07/24/16. 10:37 PM.  History   Chief Complaint Chief Complaint  Patient presents with  . Fall  . Arm Pain    HPI Comments: Gary Clark is a 45 y.o. male who presents to the Emergency Department complaining of moderate left forearm pain s/p mechanical fall that occurred at [facility]. Pt states he slipped going down his porch steps, fell backward and caught himself with his left arm. Pt denies LOC and head injury. Pt states he has taken tylenol with minimal relief of pain. He denies additional injuries. Pt takes Gabapentin daily for chronic nerve pain.   The history is provided by the patient. No language interpreter was used.  Fall  This is a new problem. The current episode started 3 to 5 hours ago. The problem occurs constantly. The problem has not changed since onset.Nothing aggravates the symptoms. The symptoms are relieved by acetaminophen. He has tried acetaminophen for the symptoms. The treatment provided mild relief.    Past Medical History:  Diagnosis Date  . Allergy   . Arthritis   . Asthma   . Back pain   . Blood in stool   . Cluster headache   . Depression   . Headache(784.0)    frequent   . Hyperlipidemia   . Hypertension   . Lumbosacral spondylosis without myelopathy 06/15/2013  . Migraines   . Postlaminectomy syndrome, lumbar region 03/08/2015   Status post L4-L5 decompression and fusion, anterior/posterior in 2011   . [facility](HCC)    pt states "mini-Stroke"  . UTI (urinary tract infection)     Patient Active Problem List   Diagnosis Date Noted  . Lumbar radiculitis 07/17/2016  . Lumbosacral radiculitis 05/19/2016  . Patellofemoral syndrome of both knees 05/19/2016  .  Sacroiliac joint dysfunction of both sides 02/04/2016  . Obesity 06/03/2015  . Mild intermittent asthma 06/03/2015  . Major depressive disorder, recurrent, in remission (HCC) 06/03/2015  . Postlaminectomy syndrome, lumbar region 03/08/2015  . Migraine/Cluster HAs 10/14/2012  . Chronic low back pain - hx spinal stenosis, decompression surgery in 2010, 2011 10/14/2012  . Hyperlipemia 10/14/2012  . Hypertension 10/14/2012    Past Surgical History:  Procedure Laterality Date  . ANTERIOR AND POSTERIOR SPINAL FUSION  2011  . BACK SURGERY    . LUMBAR LAMINECTOMY/DECOMPRESSION MICRODISCECTOMY  2010       Home Medications    Prior to Admission medications   Medication Sig Start Date End Date Taking? Authorizing Provider  acetaminophen (TYLENOL) 500 MG tablet Take 1,500 mg by mouth every 4 (four) hours.     Historical Provider, MD  citalopram (CELEXA) 40 MG tablet Take 1 tablet (40 mg total) by mouth daily. 12/20/15   Terressa KoyanagiHannah R Kim, DO  gabapentin (NEURONTIN) 300 MG capsule Take 2 capsules (600 mg total) by mouth 3 (three) times daily. 07/17/16   Erick ColaceAndrew E Kirsteins, MD  hydrochlorothiazide (HYDRODIURIL) 25 MG tablet Take 1 tablet (25 mg total) by mouth daily. 07/09/16   Terressa KoyanagiHannah R Kim, DO  HYDROcodone-acetaminophen (NORCO) 5-325 MG tablet Take 1 tablet by mouth every 6 (six) hours as needed. 07/24/16   Brantley Naser Orlene OchM Janan Bogie, NP  ibuprofen (ADVIL,MOTRIN) 200 MG tablet Take 800 mg by mouth every 6 (  six) hours as needed (pain).  10/30/13   Historical Provider, MD  lisinopril (PRINIVIL,ZESTRIL) 20 MG tablet Take 1.5 tablets (30 mg total) by mouth daily. 05/22/16   Terressa KoyanagiHannah R Kim, DO  meloxicam (MOBIC) 15 MG tablet Take 1 tablet (15 mg total) by mouth daily. 01/21/16   Erick ColaceAndrew E Kirsteins, MD  metFORMIN (GLUCOPHAGE) 500 MG tablet Take 1 tablet (500 mg total) by mouth 2 (two) times daily with a meal. 12/20/15   Terressa KoyanagiHannah R Kim, DO  rosuvastatin (CRESTOR) 40 MG tablet Take 1 tablet (40 mg total) by mouth daily. 01/03/16    Terressa KoyanagiHannah R Kim, DO  sildenafil (REVATIO) 20 MG tablet Take 2-5 tablets (40-100mg ) as needed prior to sexual activity. Use lowest effective dose. Do not take more then one dose in 24 hours. 06/22/16   Terressa KoyanagiHannah R Kim, DO    Family History Family History  Problem Relation Age of Onset  . Heart disease Mother   . Hypertension Mother   . Arthritis Father   . Hypertension Father   . Prostate cancer Father   . Miscarriages / Stillbirths Maternal Grandmother   . Heart disease Maternal Grandfather   . Sudden death Maternal Grandfather   . Breast cancer Sister   . Migraines Sister     Social History Social History  Substance Use Topics  . Smoking status: Current Every Day Smoker  . Smokeless tobacco: Never Used  . Alcohol use Yes     Comment: once a month     Allergies   Patient has no known allergies.   Review of Systems Review of Systems  Musculoskeletal: Positive for arthralgias.       Left arm pain  Neurological: Negative for syncope.  All other systems reviewed and are negative.    Physical Exam Updated Vital Signs BP 132/98 (BP Location: Right Arm)   Pulse 83   Temp 98.5 F (36.9 C) (Oral)   Resp 16   Ht 5' 5.5" (1.664 m)   Wt 97.5 kg   SpO2 99%   BMI 35.23 kg/m   Physical Exam  Constitutional: He appears well-developed and well-nourished.  HENT:  Head: Normocephalic and atraumatic.  Eyes: Conjunctivae are normal.  Neck: Neck supple.  Cardiovascular: Normal rate and regular rhythm.   No murmur heard. Radial pulse 2+.   Pulmonary/Chest: Effort normal and breath sounds normal. No respiratory distress.  Abdominal: Soft. There is no tenderness.  Musculoskeletal: Normal range of motion. He exhibits no edema.       Left forearm: He exhibits tenderness. He exhibits no deformity and no laceration.  Full range of motion of wrist, elbow and shoulder. Tenderness with palpation to the left forearm.   Neurological: He is alert.  Grip strength equal.   Skin: Skin is  warm and dry.  Psychiatric: He has a normal mood and affect.  Nursing note and vitals reviewed.    ED Treatments / Results   DIAGNOSTIC STUDIES: Oxygen Saturation is 98% on RA, normal by my interpretation.   COORDINATION OF CARE: 10:35 PM-Discussed next steps with pt. Pt verbalized understanding and is agreeable with the plan.   Labs (all labs ordered are listed, but only abnormal results are displayed) Labs Reviewed - No data to display  Radiology Dg Forearm Left  Result Date: 07/24/2016 CLINICAL DATA:  Larey SeatFell on outstretched arm today. Forearm and wrist pain. EXAM: LEFT FOREARM - 2 VIEW COMPARISON:  None. FINDINGS: There is no evidence of fracture or other focal bone lesions. Soft tissues are  unremarkable. IMPRESSION: Negative. Electronically Signed   By: Paulina Fusi M.D.   On: 07/24/2016 21:06   Dg Wrist Complete Left  Result Date: 07/24/2016 CLINICAL DATA:  Larey Seat on outstretched arm today. Forearm and wrist pain. EXAM: LEFT WRIST - COMPLETE 3+ VIEW COMPARISON:  None. FINDINGS: There is no evidence of fracture or dislocation. There is no evidence of arthropathy or other focal bone abnormality. Soft tissues are unremarkable. IMPRESSION: Negative. Electronically Signed   By: Paulina Fusi M.D.   On: 07/24/2016 21:07    Procedures Procedures (including critical care time)  Medications Ordered in ED Medications - No data to display   Initial Impression / Assessment and Plan / ED Course  I have reviewed the triage vital signs and the nursing notes.  Pertinent labs & imaging results that were available during my care of the patient were reviewed by me and considered in my medical decision making (see chart for details).  Clinical Course     Patient with left forearm pain. X-ray results reviewed by me no bony abnormality, dislocation, or fracture. Patient is neurovascularly intact distally and able to move the arm although states it is painful. Patient given ace wrap in the  ED. Discussed RICE and pain medication. Instructed patient to follow up with their primary care provider within 2-3 days. Discussed strict return precautions to the ED. patient appears reliable and expressed understanding to the discharge instructions.    Final Clinical Impressions(s) / ED Diagnoses   Final diagnoses:  Contusion of left upper extremity, initial encounter  Fall, initial encounter    New Prescriptions Discharge Medication List as of 07/24/2016 10:38 PM    START taking these medications   Details  HYDROcodone-acetaminophen (NORCO) 5-325 MG tablet Take 1 tablet by mouth every 6 (six) hours as needed., Starting Fri 07/24/2016, Print       I personally performed the services described in this documentation, which was scribed in my presence. The recorded information has been reviewed and is accurate.     [id number] [place], NP 07/25/16 1610    Charlynne Pander, MD 07/25/16 [phone]

## 2016-08-04 DIAGNOSIS — M961 Postlaminectomy syndrome, not elsewhere classified: Secondary | ICD-10-CM | POA: Diagnosis not present

## 2016-08-18 DIAGNOSIS — M961 Postlaminectomy syndrome, not elsewhere classified: Secondary | ICD-10-CM | POA: Diagnosis not present

## 2016-08-18 DIAGNOSIS — M4316 Spondylolisthesis, lumbar region: Secondary | ICD-10-CM | POA: Diagnosis not present

## 2016-08-18 DIAGNOSIS — M5136 Other intervertebral disc degeneration, lumbar region: Secondary | ICD-10-CM | POA: Diagnosis not present

## 2016-08-21 ENCOUNTER — Ambulatory Visit: Payer: Self-pay | Admitting: Physician Assistant

## 2016-09-01 ENCOUNTER — Ambulatory Visit: Payer: Commercial Managed Care - HMO | Admitting: Family Medicine

## 2016-09-02 NOTE — Progress Notes (Signed)
No chief complaint on file.   HPI:  Gary Clark is seen for optimization of general medical care prior to surgery. He has a PMH significant for HTN, Obesity, HLD, depression, migraines and chronic back pain. He has tried many interventions for his back pain and yet still suffers greatly. He recently had a normal ECG stress test with cardiology. He had a normal Echo in 2009. Surgery type: Lumbar fusion Date of surgery: planned for Jan 8th, 2018  Kidney disease? No Prior surgeries/Issues following anesthesia? Multiple back surgeries - fusion surgery in the past, not complications or issues with either Hx MI, heart arrythmia, CHF, angina or stroke? No, ? Mini stroke remotely but it sounds out it was thought to be a migraine rather then a stroke per patient Epilepsy or Seizures? none Arthritis or problems with neck or jaw? no Thyroid disease? none Liver disease? none Asthma, COPD or chronic lung disease? Hx mild asthma, not on any medications - no flares in "years" Diabetes? None, has had mild prediabetes and is on metformin for weight and this (Needs to be evaluated by anesthesia if yes to these questions.)  Other: Poor nutrition, Frail or other: no  METS:  ?Can take care of self, such as eat, dress, or use the toilet (1 MET). yes ?Can walk up a flight of steps or a hill (4 METs).yes ?Can do heavy work around the house such as scrubbing floors or lifting or moving heavy furniture (between 4 and 10 METs). yes ?Can participate in strenuous sports such as swimming, singles tennis, football, basketball, and skiing (>10 METs): yes . AHA Risks: Major predictors that require intensive management and may lead to delay in or cancellation of the operative procedure unless emergent: NONE  . Unstable coronary syndromes including unstable or severe angina or recent MI  . Decompensated heart failure including NYHA functional class IV or worsening or new-onset HF  . Significant arrhythmias including high  grade AV block, symptomatic ventricular arrhythmias, supraventricular arrhythmias with ventricular rate >100 bpm at rest, symptomatic bradycardia, and newly recognized ventricular tachycardia  . Severe heart valve disease including severe aortic stenosis or symptomatic mitral stenosis   Other clinical predictors that warrant careful assessment of current status: NONE  . History of ischemic heart disease . History of cerebrovascular disease  . History of compensated heart failure or prior heart failure  . Diabetes mellitus  . Renal insufficiency  Type of surgery and Risk: Intermediate  Medications that need to be addressed prior to surgery: follow surgeon's advice regarding all medications,  Discontinue acei/arbs/non-statin lipid lowering drugs day of surgery ASA stop 7 days before or discuss with cardiology if CV risks, other anticoagulants discuss with cardiology. Ask your surgeon regarding any over the counter medications or vitamins or supplements you are taking.  ROS: See pertinent positives and negatives per HPI. 11 point ROS negative except where noted.  Past Medical History:  Diagnosis Date  . Allergy   . Arthritis   . Asthma   . Back pain   . Blood in stool   . Cluster headache   . Depression   . Headache(784.0)    frequent   . Hyperlipidemia   . Hypertension   . Lumbosacral spondylosis without myelopathy 06/15/2013  . Migraines   . Postlaminectomy syndrome, lumbar region 03/08/2015   Status post L4-L5 decompression and fusion, anterior/posterior in 2011   . Stroke [facility])    pt states "mini-Stroke"  . UTI (urinary tract infection)     Past Surgical History:  Procedure Laterality Date  . ANTERIOR AND POSTERIOR SPINAL FUSION  2011  . BACK SURGERY    . LUMBAR LAMINECTOMY/DECOMPRESSION MICRODISCECTOMY  2010    Family History  Problem Relation Age of Onset  . Heart disease Mother   . Hypertension Mother   . Arthritis Father   . Hypertension Father   . Prostate  cancer Father   . Miscarriages / Stillbirths Maternal Grandmother   . Heart disease Maternal Grandfather   . Sudden death Maternal Grandfather   . Breast cancer Sister   . Migraines Sister     Social History   Social History  . Marital status: Married    Spouse name: [name]  . Number of children: 1  . Years of education: 12th   Occupational History  .      n/a   Social History Main Topics  . Smoking status: Never Smoker  . Smokeless tobacco: Never Used  . Alcohol use Yes     Comment: once a month  . Drug use: No  . Sexual activity: Yes    Birth control/ protection: None   Other Topics Concern  . None   Social History Narrative   Patient lives at home with his family.   Caffeine Use: 2 cups daily     Current Outpatient Prescriptions:  .  acetaminophen (TYLENOL) 500 MG tablet, Take 1,500 mg by mouth every 4 (four) hours. , Disp: , Rfl:  .  citalopram (CELEXA) 40 MG tablet, Take 1 tablet (40 mg total) by mouth daily., Disp: 90 tablet, Rfl: 3 .  gabapentin (NEURONTIN) 300 MG capsule, Take 2 capsules (600 mg total) by mouth 3 (three) times daily., Disp: 180 capsule, Rfl: 1 .  hydrochlorothiazide (HYDRODIURIL) 25 MG tablet, Take 1 tablet (25 mg total) by mouth daily., Disp: 90 tablet, Rfl: 1 .  HYDROcodone-acetaminophen (NORCO) 5-325 MG tablet, Take 1 tablet by mouth every 6 (six) hours as needed., Disp: 15 tablet, Rfl: 0 .  ibuprofen (ADVIL,MOTRIN) 200 MG tablet, Take 800 mg by mouth every 6 (six) hours as needed (pain). , Disp: , Rfl:  .  lisinopril (PRINIVIL,ZESTRIL) 20 MG tablet, Take 1.5 tablets (30 mg total) by mouth daily., Disp: 135 tablet, Rfl: 3 .  meloxicam (MOBIC) 15 MG tablet, Take 1 tablet (15 mg total) by mouth daily., Disp: 30 tablet, Rfl: 1 .  metFORMIN (GLUCOPHAGE) 500 MG tablet, Take 1 tablet (500 mg total) by mouth 2 (two) times daily with a meal., Disp: 180 tablet, Rfl: 3 .  rosuvastatin (CRESTOR) 40 MG tablet, Take 1 tablet (40 mg total) by mouth  daily., Disp: 90 tablet, Rfl: 3 .  sildenafil (REVATIO) 20 MG tablet, Take 2-5 tablets (40-'100mg'$ ) as needed prior to sexual activity. Use lowest effective dose. Do not take more then one dose in 24 hours., Disp: 50 tablet, Rfl: 0  EXAM:  Vitals:   09/03/16 1015  BP: 120/82  Pulse: 91  Temp: 98.1 F (36.7 C)    Body mass index is 37.12 kg/m.  GENERAL: vitals reviewed and listed above, alert, oriented, appears well hydrated and in no acute distress  HEENT: atraumatic, conjunttiva clear, no obvious abnormalities on inspection of external nose and ears  NECK: no obvious masses on inspection, no carotid bruits  LUNGS: clear to auscultation bilaterally, no wheezes, rales or rhonchi, good air movement  CV: HRRR, no peripheral edema, no JVD, BP normal range, normal radial pulses  MS: moves all extremities without noticeable abnormality  PSYCH: pleasant and cooperative, no  obvious depression or anxiety  ASSESSMENT AND PLAN:  Discussed the following assessment and plan:  Assessment:  Preop examination - Lumbar fusion surgery scheduled for January 2018 Chronic low back pain -Risk factors: mild prediabetes, hx remote mild asthma, hx possible mild TIA vs migrain -Surgery Risks:intermediate -age, nutritional status, fraility: good nutritional status, age >100, no fraility -functional capacity: > 8 METs wethout symptoms -comorbidities: see above -pt had negative ECG stress test earlier this year and negative heart echo in the past Patient Specific Risks: patient is low risk for intermediate risks surgery   Recommendations for optimizing general medical care prior to surgery: -meet with the anesthesiologist prior to surgery and discuss the mild prediabetes, hx remote mild asthma, hx possible mild TIA vs migraine -advised patient to discuss specific risks morbidity and mortality of surgery with surgeon, CV risks discussed with patient -advised patient will defer to surgeon for post-op  DVT prophylaxis and post op care -no specific medical recommendations to defer surgery or for further CV testing prior to surgery -Medications that need to be addressed prior to surgery: follow surgeon's advice regarding all medications,  Discontinue acei/arbs/non-statin lipid lowering drugs day of surgery ASA stop 7 days before or discuss with cardiology if CV risks, other anticoagulants discuss with cardiology. Ask your surgeon regarding any over the counter medications or vitamins or supplements you are taking. -form for pre-op optimization of general medical care prior to surgery faxed to surgeon office  -Patient advised to return or notify a doctor immediately if symptoms worsen or persist or new concerns arise.  Patient Instructions  It was good to see you today. I hope you have a Merry Christmas and that the surgery goes well!  We will send the form and our recommendations to your surgeon.  Please follow your surgeon's advise regarding all medications, vitamins and supplements and over the counter medications prior to surgery. Please hold the lisinopril, metformin and crestor the day of surgery.     Colin Benton R.

## 2016-09-03 ENCOUNTER — Encounter: Payer: Self-pay | Admitting: Family Medicine

## 2016-09-03 ENCOUNTER — Ambulatory Visit (INDEPENDENT_AMBULATORY_CARE_PROVIDER_SITE_OTHER): Payer: Commercial Managed Care - HMO | Admitting: Family Medicine

## 2016-09-03 VITALS — BP 120/82 | HR 91 | Temp 98.1°F | Ht 65.5 in | Wt 226.5 lb

## 2016-09-03 DIAGNOSIS — G8929 Other chronic pain: Secondary | ICD-10-CM

## 2016-09-03 DIAGNOSIS — M545 Low back pain: Secondary | ICD-10-CM | POA: Diagnosis not present

## 2016-09-03 DIAGNOSIS — Z01818 Encounter for other preprocedural examination: Secondary | ICD-10-CM

## 2016-09-03 NOTE — Progress Notes (Signed)
Pre visit review using our clinic review tool, if applicable. No additional management support is needed unless otherwise documented below in the visit note. 

## 2016-09-03 NOTE — Patient Instructions (Signed)
It was good to see you today. I hope you have a Alt[place] Cabal[place]erry Christmas and that the surgery goes well!  We will send the form and our recommendations to your surgeon.  Please follow your surgeon's advise regarding all medications, vitamins and supplements and over the counter medications prior to surgery. Please hold the lisinopril, metformin and crestor the day of surgery.

## 2016-09-18 ENCOUNTER — Ambulatory Visit: Payer: Commercial Managed Care - HMO | Admitting: Family Medicine

## 2016-09-23 ENCOUNTER — Other Ambulatory Visit (HOSPITAL_COMMUNITY): Payer: Self-pay | Admitting: *Deleted

## 2016-09-23 NOTE — Pre-Procedure Instructions (Addendum)
    Gary DrumJerry D Clark  09/23/2016    Your procedure is scheduled on Thursday, October 01, 2016 at 7:30 AM.   [facility] Entr[facility] "A" Admitting Office at 5:30 AM.   Call this number if you have problems the morning of surgery: [phone]   Questions prior to day of surgery, please call [phone] between 8 & 4 PM.   Remember:  Do not eat food or drink liquids after midnight Wednesday, 09/30/16.  Take these medicines the morning of surgery with A SIP OF WATER: Citalopram (Celexa), Gabapentin (Neurontin) - if needed, Tylenol - if needed  Stop NSAIDS (Ibuprofen, Meloxicam, Aleve, etc.) and don't use Aspirin products 5 days prior to surgery.   How to Manage Your Diabetes Before Surgery   Why is it important to control my blood sugar before and after surgery?   Improving blood sugar levels before and after surgery helps healing and can limit problems.  A way of improving blood sugar control is eating a healthy diet by:  - Eating less sugar and carbohydrates  - Increasing activity/exercise  - Talk with your doctor about reaching your blood sugar goals  High blood sugars (greater than 180 mg/dL) can raise your risk of infections and slow down your recovery so you will need to focus on controlling your diabetes during the weeks before surgery.  Make sure that the doctor who takes care of your diabetes knows about your planned surgery including the date and location.  How do I manage my blood sugars before surgery?   Check your blood sugar at least 4 times a day, 2 days before surgery to make sure that they are not too high or low.  Check your blood sugar the morning of your surgery when you wake up and every 2 hours until you get to the Short-Stay unit.    Treat a low blood sugar (less than 70 mg/dL) with 1/2 cup of clear juice (cranberry or apple), 4 glucose tablets, OR glucose gel.    Recheck blood sugar in 15 minutes after treatment (to make sure it is  greater than 70 mg/dL).  If blood sugar is not greater than 70 mg/dL on re-check, call [phone] for further instructions.   Report your blood sugar to the Short-Stay nurse when you get to Short-Stay.  References:  [facility], 2007 "How to Manage your Diabetes Before and After Surgery".  What do I do about my diabetes medications?   Do not take oral diabetes medicines (pills) the morning of surgery..   Do not wear jewelry.  Do not wear lotions, cologne or deodorant.   Men may shave face and neck.  Do not bring valuables to the hospital.  [facility] is not responsible for any belongings or valuables.  Contacts, dentures or bridgework may not be worn into surgery.  Leave your suitcase in the car.  After surgery it may be brought to your room.  For patients admitted to the hospital, discharge time will be determined by your treatment team.  Special instructions:  See "Preparing for Surgery" Instruction sheet.   Please read over the fact sheets that you were given.

## 2016-09-24 ENCOUNTER — Encounter (HOSPITAL_COMMUNITY): Payer: Self-pay

## 2016-09-24 ENCOUNTER — Encounter (HOSPITAL_COMMUNITY)
Admission: RE | Admit: 2016-09-24 | Discharge: 2016-09-24 | Disposition: A | Discharge status: Home / Self Care | Payer: Medicare HMO | Source: Ambulatory Visit | Attending: Orthopedic Surgery | Admitting: Orthopedic Surgery

## 2016-09-24 DIAGNOSIS — Z01810 Encounter for preprocedural cardiovascular examination: Secondary | ICD-10-CM | POA: Insufficient documentation

## 2016-09-24 DIAGNOSIS — Z01812 Encounter for preprocedural laboratory examination: Secondary | ICD-10-CM | POA: Insufficient documentation

## 2016-09-24 HISTORY — DX: Anxiety disorder, unspecified: F41.9

## 2016-09-24 HISTORY — DX: Prediabetes: R73.03

## 2016-09-24 HISTORY — DX: Sleep apnea, unspecified: G47.30

## 2016-09-24 LAB — CBC
HCT: 40.5 % (ref 39.0–52.0)
HEMOGLOBIN: 14.4 g/dL (ref 13.0–17.0)
MCH: 33.2 pg (ref 26.0–34.0)
MCHC: 35.6 g/dL (ref 30.0–36.0)
MCV: 93.3 fL (ref 78.0–100.0)
Platelets: 279 10*3/uL (ref 150–400)
RBC: 4.34 MIL/uL (ref 4.22–5.81)
RDW: 12.2 % (ref 11.5–15.5)
WBC: 7.1 10*3/uL (ref 4.0–10.5)

## 2016-09-24 LAB — BASIC METABOLIC PANEL
ANION GAP: 10 (ref 5–15)
BUN: 12 mg/dL (ref 6–20)
CHLORIDE: 103 mmol/L (ref 101–111)
CO2: 25 mmol/L (ref 22–32)
Calcium: 9.4 mg/dL (ref 8.9–10.3)
Creatinine, Ser: 0.89 mg/dL (ref 0.61–1.24)
GFR calc Af Amer: >60 mL/min (ref >60)
GFR calc non Af Amer: >60 mL/min (ref >60)
GLUCOSE: 107 mg/dL — AB (ref 65–99)
Potassium: 4.1 mmol/L (ref 3.5–5.1)
Sodium: 138 mmol/L (ref 135–145)

## 2016-09-24 LAB — TYPE AND SCREEN
ABO/RH(D): O POS
Antibody Screen: NEGATIVE

## 2016-09-24 LAB — GLUCOSE, CAPILLARY: Glucose-Capillary: 134 mg/dL — ABNORMAL HIGH (ref 65–99)

## 2016-09-24 LAB — SURGICAL PCR SCREEN
MRSA, PCR: NEGATIVE
STAPHYLOCOCCUS AUREUS: NEGATIVE

## 2016-09-24 NOTE — Progress Notes (Signed)
Some years ago, patient was having some chest discomfort and left arm pain, was sent to cardiology - [place] for stress test.  (had echo done back in 2009) Results were negative for any cardiac issue.  Clearance for surgery inside chart from his PCP Kriste BasqueHannah Kim, DO.  Has not been back to see cardio and no other symptoms.  Had sleep study done back in 2014, positive results, but he is unable to tolerate the mask, so he does not wear it. PCP is aware.  Was told he was pre diabetic.  Last A1C in August 2017 (5.9).  Will do another today. He does not check his blood sugar hardly at all.  So, unable to determine what his morning sugars are. LOV with Selena BattenKim 09/2016. [place] with no deficits.

## 2016-09-25 DIAGNOSIS — M5442 Lumbago with sciatica, left side: Secondary | ICD-10-CM | POA: Diagnosis not present

## 2016-09-25 DIAGNOSIS — G8929 Other chronic pain: Secondary | ICD-10-CM | POA: Diagnosis not present

## 2016-09-25 LAB — HEMOGLOBIN A1C
Hgb A1c MFr Bld: 5.8 % — ABNORMAL HIGH (ref 4.8–5.6)
MEAN PLASMA GLUCOSE: 120 mg/dL

## 2016-09-25 NOTE — Progress Notes (Signed)
Anesthesia Chart Review:  Pt is a 46 year old male scheduled for L3-4 XLIF, L4-5 removal of pedicle screws, L3-4 posterior lumbar fusion on 10/01/2016 with Venita Lickahari Brooks, MD  - PCP is Kriste BasqueHannah Kim, DO  PMH includes:  Stroke, HTN, hyperlipidemia, pre-diabetes, OSA (could not tolerate CPAP), asthma, cluster headache. Never smoker. BMI 37.5.   Medications include: hctz, lisinopril, metformin, crestor, sildenafil.   Preoperative labs reviewed.  HgbA1c 5.8, glucose 107  EKG 09/24/16: NSR.   Exercise tolerance test 05/28/16:   Blood pressure demonstrated a normal response to exercise.  There was no ST segment deviation noted during stress.  No T wave inversion was noted during stress.  Echo 01/10/08: Normal LV function, EF 60-65%. No evidence of structural heart disease.   If no changes, I anticipate pt can proceed with surgery as scheduled.   Rica Mastngela Dominie Benedick, FNP-BC [facility]iology Phone: [phone] 09/25/2016 3:34 PM

## 2016-09-30 ENCOUNTER — Encounter (HOSPITAL_COMMUNITY): Payer: Self-pay | Admitting: Anesthesiology

## 2016-09-30 NOTE — Anesthesia Preprocedure Evaluation (Addendum)
Anesthesia Evaluation  Patient identified by MRN, date of birth, ID band Patient awake    Reviewed: Allergy & Precautions, H&P , NPO status , Patient's Chart, lab work & pertinent test results  Airway Mallampati: II       Dental no notable dental hx.    Pulmonary    Pulmonary exam normal        Cardiovascular hypertension, Pt. on medications Normal cardiovascular exam     Neuro/Psych    GI/Hepatic   Endo/Other    Renal/GU      Musculoskeletal   Abdominal (+) + obese,   Peds  Hematology   Anesthesia Other Findings   Reproductive/Obstetrics                            Anesthesia Physical Anesthesia Plan  ASA: III  Anesthesia Plan: General   Post-op Pain Management:    Induction: Intravenous  Airway Management Planned: Oral ETT  Additional Equipment:   Intra-op Plan:   Post-operative Plan: Extubation in OR  Informed Consent: I have reviewed the patients History and Physical, chart, labs and discussed the procedure including the risks, benefits and alternatives for the proposed anesthesia with the patient or authorized representative who has indicated his/her understanding and acceptance.   Dental advisory given  Plan Discussed with: CRNA and Surgeon  Anesthesia Plan Comments:       Anesthesia Quick Evaluation

## 2016-10-01 ENCOUNTER — Inpatient Hospital Stay (HOSPITAL_COMMUNITY): Payer: Medicare HMO | Admitting: Anesthesiology

## 2016-10-01 ENCOUNTER — Inpatient Hospital Stay (HOSPITAL_COMMUNITY): Payer: Medicare HMO | Admitting: Emergency Medicine

## 2016-10-01 ENCOUNTER — Inpatient Hospital Stay (HOSPITAL_COMMUNITY)
Admission: RE | Admit: 2016-10-01 | Discharge: 2016-10-03 | DRG: 455 | Disposition: A | Discharge status: Home / Self Care | Payer: Medicare HMO | Source: Ambulatory Visit | Attending: Orthopedic Surgery | Admitting: Orthopedic Surgery

## 2016-10-01 ENCOUNTER — Encounter (HOSPITAL_COMMUNITY): Payer: Self-pay | Admitting: Certified Registered Nurse Anesthetist

## 2016-10-01 ENCOUNTER — Encounter (HOSPITAL_COMMUNITY)
Admission: RE | Disposition: A | Discharge status: Home / Self Care | Payer: Self-pay | Source: Ambulatory Visit | Attending: Orthopedic Surgery

## 2016-10-01 ENCOUNTER — Inpatient Hospital Stay (HOSPITAL_COMMUNITY): Payer: Medicare HMO

## 2016-10-01 DIAGNOSIS — M545 Low back pain: Secondary | ICD-10-CM | POA: Diagnosis present

## 2016-10-01 DIAGNOSIS — I10 Essential (primary) hypertension: Secondary | ICD-10-CM | POA: Diagnosis not present

## 2016-10-01 DIAGNOSIS — M79609 Pain in unspecified limb: Secondary | ICD-10-CM | POA: Diagnosis not present

## 2016-10-01 DIAGNOSIS — M549 Dorsalgia, unspecified: Secondary | ICD-10-CM | POA: Diagnosis present

## 2016-10-01 DIAGNOSIS — R937 Abnormal findings on diagnostic imaging of other parts of musculoskeletal system: Secondary | ICD-10-CM | POA: Diagnosis not present

## 2016-10-01 DIAGNOSIS — M533 Sacrococcygeal disorders, not elsewhere classified: Secondary | ICD-10-CM

## 2016-10-01 DIAGNOSIS — M4316 Spondylolisthesis, lumbar region: Secondary | ICD-10-CM | POA: Diagnosis not present

## 2016-10-01 DIAGNOSIS — Z472 Encounter for removal of internal fixation device: Secondary | ICD-10-CM | POA: Diagnosis not present

## 2016-10-01 DIAGNOSIS — E785 Hyperlipidemia, unspecified: Secondary | ICD-10-CM | POA: Diagnosis not present

## 2016-10-01 DIAGNOSIS — G549 Nerve root and plexus disorder, unspecified: Secondary | ICD-10-CM | POA: Diagnosis not present

## 2016-10-01 DIAGNOSIS — Z6838 Body mass index (BMI) 38.0-38.9, adult: Secondary | ICD-10-CM | POA: Diagnosis not present

## 2016-10-01 DIAGNOSIS — M5136 Other intervertebral disc degeneration, lumbar region: Secondary | ICD-10-CM | POA: Diagnosis not present

## 2016-10-01 DIAGNOSIS — Z79899 Other long term (current) drug therapy: Secondary | ICD-10-CM | POA: Diagnosis not present

## 2016-10-01 DIAGNOSIS — G894 Chronic pain syndrome: Secondary | ICD-10-CM | POA: Diagnosis not present

## 2016-10-01 DIAGNOSIS — E669 Obesity, unspecified: Secondary | ICD-10-CM | POA: Diagnosis not present

## 2016-10-01 DIAGNOSIS — G4730 Sleep apnea, unspecified: Secondary | ICD-10-CM | POA: Diagnosis present

## 2016-10-01 DIAGNOSIS — Z981 Arthrodesis status: Secondary | ICD-10-CM

## 2016-10-01 DIAGNOSIS — M961 Postlaminectomy syndrome, not elsewhere classified: Secondary | ICD-10-CM | POA: Diagnosis not present

## 2016-10-01 DIAGNOSIS — E1141 Type 2 diabetes mellitus with diabetic mononeuropathy: Secondary | ICD-10-CM | POA: Diagnosis present

## 2016-10-01 DIAGNOSIS — Z419 Encounter for procedure for purposes other than remedying health state, unspecified: Secondary | ICD-10-CM

## 2016-10-01 HISTORY — PX: HARDWARE REMOVAL: SHX979

## 2016-10-01 HISTORY — PX: ANTERIOR LAT LUMBAR FUSION: SHX1168

## 2016-10-01 LAB — GLUCOSE, CAPILLARY
Glucose-Capillary: 126 mg/dL — ABNORMAL HIGH (ref 65–99)
Glucose-Capillary: 170 mg/dL — ABNORMAL HIGH (ref 65–99)
Glucose-Capillary: 153 mg/dL — ABNORMAL HIGH (ref 65–99)
Glucose-Capillary: 165 mg/dL — ABNORMAL HIGH (ref 65–99)

## 2016-10-01 SURGERY — ANTERIOR LATERAL LUMBAR FUSION 1 LEVEL
Anesthesia: General | Site: Spine Lumbar

## 2016-10-01 MED ORDER — PHENYLEPHRINE HCL 10 MG/ML IJ SOLN
INTRAMUSCULAR | Status: DC | PRN
  Administered 2016-10-01: 160 ug via INTRAVENOUS
  Administered 2016-10-01 (×2): 120 ug via INTRAVENOUS

## 2016-10-01 MED ORDER — BUPIVACAINE-EPINEPHRINE 0.25% -1:200000 IJ SOLN
INTRAMUSCULAR | Status: DC | PRN
  Administered 2016-10-01 (×2): 10 mL

## 2016-10-01 MED ORDER — PHENOL 1.4 % MT LIQD: 1.0000 | OROMUCOSAL | Status: DC | PRN

## 2016-10-01 MED ORDER — 0.9 % SODIUM CHLORIDE (POUR BTL) OPTIME
TOPICAL | Status: DC | PRN
  Administered 2016-10-01: 1000 mL

## 2016-10-01 MED ORDER — SUCCINYLCHOLINE CHLORIDE 20 MG/ML IJ SOLN
INTRAMUSCULAR | Status: DC | PRN
  Administered 2016-10-01: 140 mg via INTRAVENOUS

## 2016-10-01 MED ORDER — CEFAZOLIN SODIUM-DEXTROSE 2-4 GM/100ML-% IV SOLN
2.0000 g | INTRAVENOUS | Status: AC
  Administered 2016-10-01 (×2): 2 g via INTRAVENOUS

## 2016-10-01 MED ORDER — ALBUMIN HUMAN 5 % IV SOLN
INTRAVENOUS | Status: DC | PRN
  Administered 2016-10-01 (×2): via INTRAVENOUS

## 2016-10-01 MED ORDER — PROPOFOL 1000 MG/100ML IV EMUL
INTRAVENOUS | Status: AC
Start: 2016-10-01 — End: 2016-10-01
  Filled 2016-10-01: qty 100

## 2016-10-01 MED ORDER — METHOCARBAMOL 1000 MG/10ML IJ SOLN
500.0000 mg | Freq: Four times a day (QID) | INTRAMUSCULAR | Status: DC | PRN
  Filled 2016-10-01: qty 5

## 2016-10-01 MED ORDER — OXYCODONE-ACETAMINOPHEN 10-325 MG PO TABS: 1.0000 | ORAL_TABLET | ORAL | 0 refills | Status: DC | PRN

## 2016-10-01 MED ORDER — ARTIFICIAL TEARS OP OINT
TOPICAL_OINTMENT | OPHTHALMIC | Status: AC
  Filled 2016-10-01: qty 3.5

## 2016-10-01 MED ORDER — MENTHOL 3 MG MT LOZG
1.0000 | LOZENGE | OROMUCOSAL | Status: DC | PRN
  Administered 2016-10-02: 3 mg via ORAL
  Filled 2016-10-01: qty 9

## 2016-10-01 MED ORDER — GLYCOPYRROLATE 0.2 MG/ML IJ SOLN
INTRAMUSCULAR | Status: DC | PRN
  Administered 2016-10-01: 0.2 mg via INTRAVENOUS

## 2016-10-01 MED ORDER — METOPROLOL TARTRATE 5 MG/5ML IV SOLN
INTRAVENOUS | Status: AC
  Filled 2016-10-01: qty 5

## 2016-10-01 MED ORDER — EPINEPHRINE PF 1 MG/ML IJ SOLN
INTRAMUSCULAR | Status: AC
  Filled 2016-10-01: qty 1

## 2016-10-01 MED ORDER — LISINOPRIL 20 MG PO TABS
20.0000 mg | ORAL_TABLET | Freq: Every day | ORAL | Status: DC
  Administered 2016-10-02 – 2016-10-03 (×2): 20 mg via ORAL
  Filled 2016-10-01 (×2): qty 1

## 2016-10-01 MED ORDER — MORPHINE SULFATE (PF) 4 MG/ML IV SOLN
INTRAVENOUS | Status: AC
  Filled 2016-10-01: qty 1

## 2016-10-01 MED ORDER — KETAMINE HCL-SODIUM CHLORIDE 100-0.9 MG/10ML-% IV SOSY
PREFILLED_SYRINGE | INTRAVENOUS | Status: AC
  Filled 2016-10-01: qty 10

## 2016-10-01 MED ORDER — ACETAMINOPHEN 325 MG PO TABS
650.0000 mg | ORAL_TABLET | ORAL | Status: DC | PRN
  Administered 2016-10-01: 650 mg via ORAL
  Filled 2016-10-01 (×2): qty 2

## 2016-10-01 MED ORDER — MORPHINE SULFATE (PF) 2 MG/ML IV SOLN
2.0000 mg | INTRAVENOUS | Status: DC | PRN
  Administered 2016-10-01 (×2): 2 mg via INTRAVENOUS
  Filled 2016-10-01: qty 1

## 2016-10-01 MED ORDER — BUPIVACAINE HCL (PF) 0.25 % IJ SOLN
INTRAMUSCULAR | Status: AC
  Filled 2016-10-01: qty 30

## 2016-10-01 MED ORDER — DEXAMETHASONE SODIUM PHOSPHATE 10 MG/ML IJ SOLN
INTRAMUSCULAR | Status: DC | PRN
  Administered 2016-10-01: 10 mg via INTRAVENOUS

## 2016-10-01 MED ORDER — ALBUTEROL SULFATE HFA 108 (90 BASE) MCG/ACT IN AERS
INHALATION_SPRAY | RESPIRATORY_TRACT | Status: DC | PRN
  Administered 2016-10-01 (×2): 2 via RESPIRATORY_TRACT

## 2016-10-01 MED ORDER — LACTATED RINGERS IV SOLN
INTRAVENOUS | Status: DC | PRN
  Administered 2016-10-01 (×3): via INTRAVENOUS

## 2016-10-01 MED ORDER — METHOCARBAMOL 500 MG PO TABS
500.0000 mg | ORAL_TABLET | Freq: Four times a day (QID) | ORAL | Status: DC | PRN
  Administered 2016-10-01 – 2016-10-02 (×3): 500 mg via ORAL
  Filled 2016-10-01 (×4): qty 1

## 2016-10-01 MED ORDER — ACETAMINOPHEN 10 MG/ML IV SOLN
INTRAVENOUS | Status: AC
  Filled 2016-10-01: qty 100

## 2016-10-01 MED ORDER — CEFAZOLIN SODIUM 1 G IJ SOLR
INTRAMUSCULAR | Status: AC
  Filled 2016-10-01: qty 20

## 2016-10-01 MED ORDER — FENTANYL CITRATE (PF) 100 MCG/2ML IJ SOLN
INTRAMUSCULAR | Status: AC
  Filled 2016-10-01: qty 2

## 2016-10-01 MED ORDER — PROMETHAZINE HCL 25 MG/ML IJ SOLN: 6.2500 mg | INTRAMUSCULAR | Status: DC | PRN

## 2016-10-01 MED ORDER — METHOCARBAMOL 500 MG PO TABS
ORAL_TABLET | ORAL | Status: AC
  Filled 2016-10-01: qty 1

## 2016-10-01 MED ORDER — OXYCODONE HCL 5 MG PO TABS
10.0000 mg | ORAL_TABLET | ORAL | Status: DC | PRN
Start: 2016-10-01 — End: 2016-10-03
  Administered 2016-10-01 – 2016-10-03 (×7): 10 mg via ORAL
  Filled 2016-10-01 (×6): qty 2

## 2016-10-01 MED ORDER — LIDOCAINE HCL (CARDIAC) 20 MG/ML IV SOLN
INTRAVENOUS | Status: DC | PRN
  Administered 2016-10-01: 100 mg via INTRATRACHEAL

## 2016-10-01 MED ORDER — HYDROCHLOROTHIAZIDE 25 MG PO TABS
25.0000 mg | ORAL_TABLET | Freq: Every day | ORAL | Status: DC
  Administered 2016-10-02 – 2016-10-03 (×2): 25 mg via ORAL
  Filled 2016-10-01 (×2): qty 1

## 2016-10-01 MED ORDER — PROPOFOL 1000 MG/100ML IV EMUL
INTRAVENOUS | Status: AC
  Filled 2016-10-01: qty 100

## 2016-10-01 MED ORDER — HEMOSTATIC AGENTS (NO CHARGE) OPTIME
TOPICAL | Status: DC | PRN
  Administered 2016-10-01 (×2): 1 via TOPICAL

## 2016-10-01 MED ORDER — PROPOFOL 500 MG/50ML IV EMUL
INTRAVENOUS | Status: DC | PRN
  Administered 2016-10-01: 100 ug/kg/min via INTRAVENOUS
  Administered 2016-10-01 (×2): via INTRAVENOUS

## 2016-10-01 MED ORDER — ROCURONIUM BROMIDE 50 MG/5ML IV SOSY
PREFILLED_SYRINGE | INTRAVENOUS | Status: AC
  Filled 2016-10-01: qty 5

## 2016-10-01 MED ORDER — SODIUM CHLORIDE 0.9% FLUSH: 3.0000 mL | INTRAVENOUS | Status: DC | PRN

## 2016-10-01 MED ORDER — FENTANYL CITRATE (PF) 100 MCG/2ML IJ SOLN
INTRAMUSCULAR | Status: AC
  Filled 2016-10-01: qty 4

## 2016-10-01 MED ORDER — DEXAMETHASONE SODIUM PHOSPHATE 4 MG/ML IJ SOLN
4.0000 mg | Freq: Four times a day (QID) | INTRAMUSCULAR | Status: AC
  Administered 2016-10-02 (×2): 4 mg via INTRAVENOUS
  Filled 2016-10-01 (×3): qty 1

## 2016-10-01 MED ORDER — PHENYLEPHRINE 40 MCG/ML (10ML) SYRINGE FOR IV PUSH (FOR BLOOD PRESSURE SUPPORT)
PREFILLED_SYRINGE | INTRAVENOUS | Status: AC
  Filled 2016-10-01: qty 10

## 2016-10-01 MED ORDER — KETOROLAC TROMETHAMINE 30 MG/ML IJ SOLN
INTRAMUSCULAR | Status: AC
  Filled 2016-10-01: qty 1

## 2016-10-01 MED ORDER — MIDAZOLAM HCL 2 MG/2ML IJ SOLN
INTRAMUSCULAR | Status: AC
Start: 2016-10-01 — End: 2016-10-01
  Filled 2016-10-01: qty 2

## 2016-10-01 MED ORDER — KETOROLAC TROMETHAMINE 30 MG/ML IJ SOLN
30.0000 mg | Freq: Once | INTRAMUSCULAR | Status: AC
  Administered 2016-10-01: 30 mg via INTRAVENOUS

## 2016-10-01 MED ORDER — ONDANSETRON HCL 4 MG/2ML IJ SOLN
4.0000 mg | INTRAMUSCULAR | Status: DC | PRN
  Administered 2016-10-02: 4 mg via INTRAVENOUS

## 2016-10-01 MED ORDER — CEFAZOLIN SODIUM-DEXTROSE 2-4 GM/100ML-% IV SOLN
2.0000 g | Freq: Three times a day (TID) | INTRAVENOUS | Status: AC
  Administered 2016-10-01 – 2016-10-02 (×2): 2 g via INTRAVENOUS
  Filled 2016-10-01 (×2): qty 100

## 2016-10-01 MED ORDER — DOCUSATE SODIUM 100 MG PO CAPS
100.0000 mg | ORAL_CAPSULE | Freq: Two times a day (BID) | ORAL | Status: DC
  Administered 2016-10-02 – 2016-10-03 (×3): 100 mg via ORAL
  Filled 2016-10-01 (×3): qty 1

## 2016-10-01 MED ORDER — KETAMINE HCL 10 MG/ML IJ SOLN
INTRAMUSCULAR | Status: DC | PRN
  Administered 2016-10-01 (×2): 20 mg via INTRAVENOUS

## 2016-10-01 MED ORDER — POLYETHYLENE GLYCOL 3350 17 G PO PACK
17.0000 g | PACK | Freq: Every day | ORAL | Status: DC | PRN
  Administered 2016-10-02: 17 g via ORAL
  Filled 2016-10-01: qty 1

## 2016-10-01 MED ORDER — ONDANSETRON HCL 4 MG/2ML IJ SOLN
INTRAMUSCULAR | Status: AC
  Filled 2016-10-01: qty 2

## 2016-10-01 MED ORDER — INSULIN ASPART 100 UNIT/ML ~~LOC~~ SOLN
0.0000 [IU] | Freq: Three times a day (TID) | SUBCUTANEOUS | Status: DC
  Administered 2016-10-02 (×3): 3 [IU] via SUBCUTANEOUS

## 2016-10-01 MED ORDER — CITALOPRAM HYDROBROMIDE 40 MG PO TABS
40.0000 mg | ORAL_TABLET | Freq: Every day | ORAL | Status: DC
  Administered 2016-10-02 – 2016-10-03 (×2): 40 mg via ORAL
  Filled 2016-10-01 (×2): qty 1

## 2016-10-01 MED ORDER — ACETAMINOPHEN 325 MG PO TABS
ORAL_TABLET | ORAL | Status: AC
  Filled 2016-10-01: qty 2

## 2016-10-01 MED ORDER — INSULIN ASPART 100 UNIT/ML ~~LOC~~ SOLN
0.0000 [IU] | Freq: Every day | SUBCUTANEOUS | Status: DC
Start: 2016-10-01 — End: 2016-10-03

## 2016-10-01 MED ORDER — FENTANYL CITRATE (PF) 100 MCG/2ML IJ SOLN
25.0000 ug | INTRAMUSCULAR | Status: DC | PRN
  Administered 2016-10-01 (×3): 50 ug via INTRAVENOUS

## 2016-10-01 MED ORDER — METHOCARBAMOL 500 MG PO TABS: 500.0000 mg | ORAL_TABLET | Freq: Three times a day (TID) | ORAL | 0 refills | Status: DC | PRN

## 2016-10-01 MED ORDER — ACETAMINOPHEN 10 MG/ML IV SOLN
INTRAVENOUS | Status: DC | PRN
  Administered 2016-10-01: 1000 mg via INTRAVENOUS

## 2016-10-01 MED ORDER — LACTATED RINGERS IV SOLN: INTRAVENOUS | Status: DC

## 2016-10-01 MED ORDER — DEXTROSE 5 % IV SOLN
INTRAVENOUS | Status: DC | PRN
  Administered 2016-10-01: 50 ug/min via INTRAVENOUS

## 2016-10-01 MED ORDER — SUGAMMADEX SODIUM 200 MG/2ML IV SOLN
INTRAVENOUS | Status: AC
Start: 2016-10-01 — End: 2016-10-01
  Filled 2016-10-01: qty 2

## 2016-10-01 MED ORDER — ARTIFICIAL TEARS OP OINT
TOPICAL_OINTMENT | OPHTHALMIC | Status: DC | PRN
  Administered 2016-10-01: 1 via OPHTHALMIC

## 2016-10-01 MED ORDER — METFORMIN HCL 500 MG PO TABS
500.0000 mg | ORAL_TABLET | Freq: Every day | ORAL | Status: DC
  Administered 2016-10-02 – 2016-10-03 (×2): 500 mg via ORAL
  Filled 2016-10-01 (×2): qty 1

## 2016-10-01 MED ORDER — METOPROLOL TARTARATE 1 MG/ML SYRINGE (5ML)
Status: DC | PRN
  Administered 2016-10-01: 1 mg via INTRAVENOUS

## 2016-10-01 MED ORDER — ACETAMINOPHEN 650 MG RE SUPP
650.0000 mg | RECTAL | Status: DC | PRN
  Filled 2016-10-01: qty 1

## 2016-10-01 MED ORDER — MIDAZOLAM HCL 2 MG/2ML IJ SOLN
INTRAMUSCULAR | Status: DC | PRN
  Administered 2016-10-01 (×2): 1 mg via INTRAVENOUS

## 2016-10-01 MED ORDER — ONDANSETRON HCL 4 MG/2ML IJ SOLN
INTRAMUSCULAR | Status: DC | PRN
  Administered 2016-10-01: 4 mg via INTRAVENOUS

## 2016-10-01 MED ORDER — SODIUM CHLORIDE 0.9 % IV SOLN
250.0000 mL | INTRAVENOUS | Status: DC
  Administered 2016-10-01: 250 mL via INTRAVENOUS

## 2016-10-01 MED ORDER — MEPERIDINE HCL 25 MG/ML IJ SOLN: 6.2500 mg | INTRAMUSCULAR | Status: DC | PRN

## 2016-10-01 MED ORDER — ONDANSETRON HCL 4 MG PO TABS: 4.0000 mg | ORAL_TABLET | Freq: Three times a day (TID) | ORAL | 0 refills | Status: DC | PRN

## 2016-10-01 MED ORDER — PROPOFOL 10 MG/ML IV BOLUS
INTRAVENOUS | Status: DC | PRN
  Administered 2016-10-01: 200 mg via INTRAVENOUS

## 2016-10-01 MED ORDER — FENTANYL CITRATE (PF) 100 MCG/2ML IJ SOLN
INTRAMUSCULAR | Status: DC | PRN
  Administered 2016-10-01: 100 ug via INTRAVENOUS
  Administered 2016-10-01 (×2): 50 ug via INTRAVENOUS

## 2016-10-01 MED ORDER — DEXAMETHASONE 4 MG PO TABS
4.0000 mg | ORAL_TABLET | Freq: Four times a day (QID) | ORAL | Status: AC
  Filled 2016-10-01: qty 1

## 2016-10-01 MED ORDER — LIDOCAINE 2% (20 MG/ML) 5 ML SYRINGE
INTRAMUSCULAR | Status: AC
  Filled 2016-10-01: qty 5

## 2016-10-01 MED ORDER — OXYCODONE HCL 5 MG PO TABS
ORAL_TABLET | ORAL | Status: AC
  Filled 2016-10-01: qty 2

## 2016-10-01 MED ORDER — SODIUM CHLORIDE 0.9% FLUSH
3.0000 mL | Freq: Two times a day (BID) | INTRAVENOUS | Status: DC
  Administered 2016-10-01 – 2016-10-02 (×3): 3 mL via INTRAVENOUS

## 2016-10-01 MED ORDER — THROMBIN 20000 UNITS EX SOLR
CUTANEOUS | Status: AC
  Filled 2016-10-01: qty 20000

## 2016-10-01 MED ORDER — CEFAZOLIN SODIUM-DEXTROSE 2-4 GM/100ML-% IV SOLN
INTRAVENOUS | Status: AC
  Filled 2016-10-01: qty 100

## 2016-10-01 SURGICAL SUPPLY — 116 items
BLADE SURG 10 STRL SS (BLADE) ×4 IMPLANT
BLADE SURG ROTATE 9660 (MISCELLANEOUS) IMPLANT
BONE MATRIX OSTEOCEL PRO MED (Bone Implant) ×2 IMPLANT
BONE VIVIGEN FORMABLE 5.4CC (Bone Implant) ×2 IMPLANT
BUR EGG ELITE 4.0 (BURR) IMPLANT
CAGE COROENT XLWTI 12X22X55-10 (Cage) ×2 IMPLANT
CANISTER SUCT 3000ML (MISCELLANEOUS) ×2 IMPLANT
CLIP NEUROVISION LG (CLIP) ×2 IMPLANT
CLSR STERI-STRIP ANTIMIC 1/2X4 (GAUZE/BANDAGES/DRESSINGS) ×6 IMPLANT
CORDS BIPOLAR (ELECTRODE) ×4 IMPLANT
COVER MAYO STAND STRL (DRAPES) ×2 IMPLANT
COVER SURGICAL LIGHT HANDLE (MISCELLANEOUS) ×2 IMPLANT
CUFF TOURNIQUET SINGLE 34IN LL (TOURNIQUET CUFF) IMPLANT
CUFF TOURNIQUET SINGLE 44IN (TOURNIQUET CUFF) IMPLANT
DERMABOND ADVANCED (GAUZE/BANDAGES/DRESSINGS)
DERMABOND ADVANCED .7 DNX12 (GAUZE/BANDAGES/DRESSINGS) IMPLANT
DRAPE C-ARM 42X72 X-RAY (DRAPES) ×4 IMPLANT
DRAPE C-ARMOR (DRAPES) ×2 IMPLANT
DRAPE INCISE IOBAN 66X45 STRL (DRAPES) ×2 IMPLANT
DRAPE LAPAROTOMY TRNSV 102X78 (DRAPE) ×2 IMPLANT
DRAPE ORTHO SPLIT 77X108 STRL (DRAPES) ×1
DRAPE POUCH INSTRU U-SHP 10X18 (DRAPES) ×2 IMPLANT
DRAPE PROXIMA HALF (DRAPES) ×2 IMPLANT
DRAPE SURG 17X23 STRL (DRAPES) ×2 IMPLANT
DRAPE SURG ORHT 6 SPLT 77X108 (DRAPES) ×1 IMPLANT
DRAPE TABLE COVER HEAVY DUTY (DRAPES) IMPLANT
DRAPE U-SHAPE 47X51 STRL (DRAPES) ×6 IMPLANT
DRSG ADAPTIC 3X8 NADH LF (GAUZE/BANDAGES/DRESSINGS) IMPLANT
DRSG AQUACEL AG ADV 3.5X10 (GAUZE/BANDAGES/DRESSINGS) ×6 IMPLANT
DRSG OPSITE 6X11 MED (GAUZE/BANDAGES/DRESSINGS) ×2 IMPLANT
DURAPREP 26ML APPLICATOR (WOUND CARE) ×4 IMPLANT
ELECT BLADE 4.0 EZ CLEAN MEGAD (MISCELLANEOUS) ×2
ELECT BLADE 6.5 EXT (BLADE) IMPLANT
ELECT CAUTERY BLADE 6.4 (BLADE) IMPLANT
ELECT PENCIL ROCKER SW 15FT (MISCELLANEOUS) ×4 IMPLANT
ELECT REM PT RETURN 9FT ADLT (ELECTROSURGICAL) ×4
ELECTRODE BLDE 4.0 EZ CLN MEGD (MISCELLANEOUS) ×1 IMPLANT
ELECTRODE REM PT RTRN 9FT ADLT (ELECTROSURGICAL) ×2 IMPLANT
EVACUATOR 1/8 PVC DRAIN (DRAIN) IMPLANT
FILTER STRAW FLUID ASPIR (MISCELLANEOUS) ×2 IMPLANT
GAUZE SPONGE 4X4 12PLY STRL (GAUZE/BANDAGES/DRESSINGS) IMPLANT
GAUZE SPONGE 4X4 16PLY XRAY LF (GAUZE/BANDAGES/DRESSINGS) IMPLANT
GLOVE BIO SURGEON STRL SZ 6.5 (GLOVE) ×2 IMPLANT
GLOVE BIOGEL PI IND STRL 6.5 (GLOVE) ×1 IMPLANT
GLOVE BIOGEL PI IND STRL 8.5 (GLOVE) ×1 IMPLANT
GLOVE BIOGEL PI INDICATOR 6.5 (GLOVE) ×1
GLOVE BIOGEL PI INDICATOR 8.5 (GLOVE) ×1
GLOVE SS BIOGEL STRL SZ 8 (GLOVE) ×1 IMPLANT
GLOVE SS BIOGEL STRL SZ 8.5 (GLOVE) ×1 IMPLANT
GLOVE SUPERSENSE BIOGEL SZ 8 (GLOVE) ×1
GLOVE SUPERSENSE BIOGEL SZ 8.5 (GLOVE) ×1
GOWN STRL REUS W/ TWL LRG LVL3 (GOWN DISPOSABLE) ×1 IMPLANT
GOWN STRL REUS W/ TWL XL LVL3 (GOWN DISPOSABLE) ×2 IMPLANT
GOWN STRL REUS W/TWL 2XL LVL3 (GOWN DISPOSABLE) ×4 IMPLANT
GOWN STRL REUS W/TWL LRG LVL3 (GOWN DISPOSABLE) ×1
GOWN STRL REUS W/TWL XL LVL3 (GOWN DISPOSABLE) ×2
GUIDEWIRE NITINOL BEVEL TIP (WIRE) ×4 IMPLANT
KIT BASIN OR (CUSTOM PROCEDURE TRAY) ×2 IMPLANT
KIT DILATOR XLIF 5 (KITS) ×1 IMPLANT
KIT POSITION SURG JACKSON T1 (MISCELLANEOUS) IMPLANT
KIT ROOM TURNOVER OR (KITS) ×2 IMPLANT
KIT SURGICAL ACCESS MAXCESS 4 (KITS) ×2 IMPLANT
KIT XLIF (KITS) ×1
MODULE NVM5 NEXT GEN EMG (NEEDLE) ×2 IMPLANT
NEEDLE 18GX1X1/2 (RX/OR ONLY) (NEEDLE) ×2 IMPLANT
NEEDLE 22X1 1/2 (OR ONLY) (NEEDLE) ×2 IMPLANT
NEEDLE I-PASS III (NEEDLE) ×2 IMPLANT
NEEDLE SPNL 18GX3.5 QUINCKE PK (NEEDLE) ×4 IMPLANT
NS IRRIG 1000ML POUR BTL (IV SOLUTION) ×2 IMPLANT
PACK LAMINECTOMY ORTHO (CUSTOM PROCEDURE TRAY) ×2 IMPLANT
PACK UNIVERSAL I (CUSTOM PROCEDURE TRAY) ×4 IMPLANT
PAD ARMBOARD 7.5X6 YLW CONV (MISCELLANEOUS) ×10 IMPLANT
PATTIES SURGICAL .5 X.5 (GAUZE/BANDAGES/DRESSINGS) IMPLANT
PATTIES SURGICAL .5 X1 (DISPOSABLE) IMPLANT
POSITIONER HEAD PRONE TRACH (MISCELLANEOUS) ×2 IMPLANT
PROBE BALL TIP NVM5 SNG USE (BALLOONS) ×2 IMPLANT
ROD RELINE MAS LORD 5.5X45MM (Rod) ×4 IMPLANT
SCREW LOCK RELINE 5.5 TULIP (Screw) ×8 IMPLANT
SCREW RED MAS POLY 7.5X40MM (Screw) ×4 IMPLANT
SCREW RELINE MAS POLY 6.5X40MM (Screw) ×4 IMPLANT
SPONGE LAP 4X18 X RAY DECT (DISPOSABLE) ×6 IMPLANT
SPONGE SURGIFOAM ABS GEL 100 (HEMOSTASIS) IMPLANT
STAPLER VISISTAT 35W (STAPLE) ×2 IMPLANT
STRIP CLOSURE SKIN 1/2X4 (GAUZE/BANDAGES/DRESSINGS) IMPLANT
SURGIFLO W/THROMBIN 8M KIT (HEMOSTASIS) ×6 IMPLANT
SUT BONE WAX W31G (SUTURE) ×2 IMPLANT
SUT ETHILON 3 0 FSL (SUTURE) IMPLANT
SUT MNCRL AB 3-0 PS2 18 (SUTURE) ×2 IMPLANT
SUT MON AB 3-0 SH 27 (SUTURE)
SUT MON AB 3-0 SH27 (SUTURE) IMPLANT
SUT PDS AB 1 CTX 36 (SUTURE) ×2 IMPLANT
SUT VIC AB 0 CT1 18XCR BRD 8 (SUTURE) ×1 IMPLANT
SUT VIC AB 0 CT1 8-18 (SUTURE) ×1
SUT VIC AB 0 CTB1 27 (SUTURE) IMPLANT
SUT VIC AB 1 CT1 18XCR BRD 8 (SUTURE) ×2 IMPLANT
SUT VIC AB 1 CT1 27 (SUTURE)
SUT VIC AB 1 CT1 27XBRD ANBCTR (SUTURE) IMPLANT
SUT VIC AB 1 CT1 8-18 (SUTURE) ×2
SUT VIC AB 1 CTX 36 (SUTURE)
SUT VIC AB 1 CTX36XBRD ANBCTR (SUTURE) IMPLANT
SUT VIC AB 2-0 CT1 18 (SUTURE) ×6 IMPLANT
SUT VIC AB 2-0 CT1 27 (SUTURE) ×1
SUT VIC AB 2-0 CT1 TAPERPNT 27 (SUTURE) ×1 IMPLANT
SUT VIC AB 3-0 X1 27 (SUTURE) IMPLANT
SYR BULB IRRIGATION 50ML (SYRINGE) ×2 IMPLANT
SYR CONTROL 10ML LL (SYRINGE) ×4 IMPLANT
SYR TB 1ML LUER SLIP (SYRINGE) ×2 IMPLANT
SYSTEM NUVAMAP OR (SYSTAGENIX WOUND MANAGEMENT) ×2 IMPLANT
TAPE CLOTH 4X10 WHT NS (GAUZE/BANDAGES/DRESSINGS) ×4 IMPLANT
TOWEL OR 17X24 6PK STRL BLUE (TOWEL DISPOSABLE) ×2 IMPLANT
TOWEL OR 17X26 10 PK STRL BLUE (TOWEL DISPOSABLE) ×2 IMPLANT
TRAY FOLEY CATH 16FR SILVER (SET/KITS/TRAYS/PACK) ×2 IMPLANT
TRAY FOLEY CATH 16FRSI W/METER (SET/KITS/TRAYS/PACK) ×2 IMPLANT
TUBE CONNECTING 12X1/4 (SUCTIONS) ×2 IMPLANT
WATER STERILE IRR 1000ML POUR (IV SOLUTION) IMPLANT
YANKAUER SUCT BULB TIP NO VENT (SUCTIONS) ×2 IMPLANT

## 2016-10-01 NOTE — Transfer of Care (Signed)
Immediate Anesthesia Transfer of Care Note  Patient: Gary Clark  Procedure(s) Performed: Procedure(s): XLIF L3-4 ANTERIOR LATERAL LUMBAR FUSION 1 LEVEL (N/A) Removal of pedical screws L4-5 HARDWARE REMOVAL (N/A) PSFI L3-4 POSTERIOR LUMBAR FUSION 1 LEVEL (N/A)  Patient Location: PACU  Anesthesia Type:General  Level of Consciousness: awake, alert  and patient cooperative  Airway & Oxygen Therapy: Patient Spontanous Breathing and Patient connected to nasal cannula oxygen  Post-op Assessment: Report given to RN and Post -op Vital signs reviewed and stable  Post vital signs: Reviewed and stable  Last Vitals:  Vitals:   10/01/16 0610 10/01/16 1215  BP: (!) 155/103   Pulse: 81   Temp: 37.1 C (P) 36.2 C    Last Pain:  Vitals:   10/01/16 0643  TempSrc:   PainSc: 4          Complications: No apparent anesthesia complications

## 2016-10-01 NOTE — Procedures (Signed)
Patient stated he does have OSA but that he does not wear CPAP or BIPAP at home.  He also stated he does not want to wear while in hospital.

## 2016-10-01 NOTE — Anesthesia Postprocedure Evaluation (Addendum)
Anesthesia Post Note  Patient: Gary Clark  Procedure(s) Performed: Procedure(s) (LRB): XLIF L3-4 ANTERIOR LATERAL LUMBAR FUSION 1 LEVEL (N/A) Removal of pedical screws L4-5 HARDWARE REMOVAL (N/A) PSFI L3-4 POSTERIOR LUMBAR FUSION 1 LEVEL (N/A)  Patient location during evaluation: PACU Anesthesia Type: General Level of consciousness: awake and sedated Pain management: pain level controlled Vital Signs Assessment: post-procedure vital signs reviewed and stable Respiratory status: spontaneous breathing Cardiovascular status: stable Postop Assessment: no signs of nausea or vomiting Anesthetic complications: no        Last Vitals:  Vitals:   10/01/16 1245 10/01/16 1255  BP: (!) 143/99   Pulse: 97 96  Resp: 18 (!) 23  Temp:      Last Pain:  Vitals:   10/01/16 1255  TempSrc:   PainSc: 7    Pain Goal:                 Gary Clark,Gary Clark

## 2016-10-01 NOTE — Brief Op Note (Signed)
10/01/2016  11:35 AM  PATIENT:  Gary Clark  46 y.o. 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  PROCEDURE:  Procedure(s): XLIF L3-4 ANTERIOR LATERAL LUMBAR FUSION 1 LEVEL (N/A) Removal of pedical screws L4-5 HARDWARE REMOVAL (N/A) PSFI L3-4 POSTERIOR LUMBAR FUSION 1 LEVEL (N/A)  SURGEON:  Surgeon(s) and Role:    * Venita Lickahari Shanisha Lech, MD - Primary  PHYSICIAN ASSISTANT:   ASSISTANTS: Carmen Mayo   ANESTHESIA:   general  EBL:  Total I/O In: 2500 [I.V.:2000; IV Piggyback:500] Out: 460 [Urine:260; Blood:200]  BLOOD ADMINISTERED:none  DRAINS: none   LOCAL MEDICATIONS USED:  MARCAINE     SPECIMEN:  No Specimen  DISPOSITION OF SPECIMEN:  N/A  COUNTS:  YES  TOURNIQUET:  * No tourniquets in log *  DICTATION: .Other Dictation: Dictation Number [id number]  PLAN OF CARE: Admit to inpatient   PATIENT DISPOSITION:  PACU - hemodynamically stable.

## 2016-10-01 NOTE — H&P (Signed)
History of Present Illness The patient is a 46 year old male who comes in today for a preoperative History and Physical. The patient is scheduled for a XLIF L3-4, PSFI L3-4 and removal of posterior instrumentation to be performed by Dr. Debria Garret D. Shon Baton, MD at [facility] on 09/21/2016 . Please see the hospital record for complete dictated history and physical. Pt takes Metformin for DM2. Pt does carry a diagnosis of sleep apnea. He is unable to use his CPAP.  Problem List/Past Medical  Chronic pain syndrome (338.4)  Lumbar adjacent segment disease with spondylolisthesis (M51.36, M43.16)  Postlaminectomy syndrome of lumbar region (M96.1)  Problems Reconciled   Allergies No Known Drug Allergies [07/14/2012]: No Known Allergies [07/05/2012]: Allergies Reconciled   Family History  Cerebrovascular Accident  grandfather mothers side Chronic Obstructive Lung Disease  father Congestive Heart Failure  grandmother mothers side and grandfather mothers side Heart Disease  mother, grandmother mothers side and grandfather mothers side Heart disease in male family member before age 40  Heart disease in male family member before age 54  Hypertension  mother  Social History Tobacco use  never smoker Alcohol use  current drinker; only occasionally per week Children  1 Current work status  disabled Drug/Alcohol Rehab (Currently)  no Drug/Alcohol Rehab (Previously)  no Exercise  Exercises monthly; does running / walking Illicit drug use  no Living situation  live with spouse Marital status  married Number of flights of stairs before winded  less than 1 Pain Contract  no  Medication History Hydrochlorothiazide (25MG  Tablet, 1 Oral daily) Active. Citalopram Hydrobromide (40MG  Tablet, 1 Oral daily) Active. Lisinopril (20MG  Tablet, 1 Oral daily) Active. Butterbur Active. Medications Reconciled  Vitals  09/25/2016 7:54 AM Weight: 228 lb Height:  65.5in Body Surface Area: 2.1 m Body Mass Index: 37.36 kg/m  Temp.: 98.49F  Pulse: 77 (Regular)  BP: 159/117 (Sitting, Left Arm, Standard) w/shoes smt  Physical Exam  General General Appearance-Not in acute distress. Orientation-Oriented X3. Build & Nutrition-Well nourished and Well developed.  Integumentary General Characteristics Surgical Scars - surgical scarring consistent with previous lumbar surgery. Lumbar Spine-Skin examination of the lumbar spine is without deformity, skin lesions, lacerations or abrasions.  Chest and Lung Exam Auscultation Breath sounds - Normal and Clear.  Cardiovascular Auscultation Rhythm - Regular rate and rhythm.  Abdomen Palpation/Percussion Palpation and Percussion of the abdomen reveal - Soft, Non Tender and No Rebound tenderness.  Peripheral Vascular Lower Extremity Palpation - Posterior tibial pulse - Bilateral - 2+. Dorsalis pedis pulse - Bilateral - 2+.  Neurologic Sensation Lower Extremity - Bilateral - sensation is intact in the lower extremity. Reflexes Patellar Reflex - Bilateral - 2+. Achilles Reflex - Bilateral - 2+. Clonus - Bilateral - clonus not present. Hoffman's Sign - Bilateral - Hoffman's sign not present. Testing Seated Straight Leg Raise - Bilateral - Seated straight leg raise positive.  Musculoskeletal Spine/Ribs/Pelvis  Lumbosacral Spine: Inspection and Palpation - Tenderness - left lumbar paraspinals tender to palpation and right lumbar paraspinals tender to palpation. Strength and Tone: Strength - Hip Flexion - Bilateral - 5/5. Knee Extension - Bilateral - 5/5. Knee Flexion - Bilateral - 5/5. Ankle Dorsiflexion - Bilateral - 5/5. Ankle Plantarflexion - Bilateral - 5/5. Heel walk - Bilateral - able to heel walk without difficulty. Toe Walk - Bilateral - able to walk on toes without difficulty. Heel-Toe Walk - Bilateral - able to heel-toe walk without difficulty. ROM - Flexion - moderately  decreased range of motion and painful. Extension -  moderately decreased range of motion and painful. Left Lateral Bending - moderately decreased range of motion and painful. Right Lateral Bending - moderately decreased range of motion and painful. Right Rotation - moderately decreased range of motion and painful. Left Rotation - moderately decreased range of motion and painful. Pain - neither flexion or extension is more painful than the other. Lumbosacral Spine - Waddell's Signs - no Waddell's signs present. Lower Extremity Range of Motion - No true hip, knee or ankle pain with range of motion. Gait and Station - [employer]ive Devices - no assistive devices.    Assessment & Plan   Goal Of Surgery: Discussed that goal of surgery is to reduce pain and improve function and quality of life. Patient is aware that despite all appropriate treatment that there pain and function could be the same, worse, or different.  Lateral Lumbar Fusion: Risksof surgery include, but are not limited to: Death, stroke, paralysis, nerve root damage/injury, bleeding, blood clots, loss of bowel/bladder control, sexual dysfunction, retrograde ejaculation, hardware failure, or mal-position, spinal fluid leak, adjacent segment disease, non-union, need for further surgery, ongoing or worse pain, and injury to bladder, bowel and abdominal contents, infection. Injury to the lumbar plexus resulting in temporary or permanent hip flexor weakness and difficulty walking. Need for supplemental posterior surgery including decompression and need for screw fixation. Lumbar adjacent segment disease with spondylolisthesis   At this point in time he continues to have horrific back pain with radiation primarily into the right anterior and lateral thigh. He states that despite several injections and the physical therapy, his quality of life continues to deteriorate. His dynamic flexion CT myelogram shows dynamic retrolisthesis at L3-L4. His MRI did  not show significant direct neural compromise, but this was a static picture. At this point, I do think the retrolisthesis of L3-L4 is causing irritation to the L3 nerve root, as well as most likely the traversing L4 nerve root, generating his pain. The degenerative disease, although not severe, is also contributing to his back pain. At this point, we have tried for several years now multiple forms of conservative management, and unfortunately he continues to deteriorate. As a result, we have elected to proceed with surgery. I think the best course of action is a lateral L3-L4 interbody fusion with removal of the posterior pedicle screw construct and supplemental posterior fixation at L3-L4. We have gone over the risks and benefits of surgery. All of his questions were addressed.

## 2016-10-01 NOTE — Anesthesia Procedure Notes (Addendum)
Procedure Name: Intubation Date/Time: 10/01/2016 7:42 AM Performed by: Lyn Hollingshead Pre-anesthesia Checklist: Patient identified, Emergency Drugs available, Suction available and Patient being monitored Patient Re-evaluated:Patient Re-evaluated prior to inductionOxygen Delivery Method: Circle system utilized Preoxygenation: Pre-oxygenation with 100% oxygen Intubation Type: IV induction Ventilation: Oral airway inserted - appropriate to patient size Laryngoscope Size: Mac and 3 Grade View: Grade III Tube type: Oral Tube size: 7.5 mm Number of attempts: 1 Airway Equipment and Method: Stylet Placement Confirmation: ETT inserted through vocal cords under direct vision,  positive ETCO2 and breath sounds checked- equal and bilateral Secured at: 23 cm Tube secured with: Tape Dental Injury: Injury to lip and Injury to tongue  Difficulty Due To: Difficulty was unanticipated, Difficult Airway- due to anterior larynx and Difficult Airway- due to large tongue

## 2016-10-01 NOTE — Anesthesia Procedure Notes (Deleted)
Performed by: HATCHETT, FRANKLIN      

## 2016-10-01 NOTE — Anesthesia Procedure Notes (Signed)
Performed by: HATCHETT, FRANKLIN      

## 2016-10-02 ENCOUNTER — Inpatient Hospital Stay (HOSPITAL_COMMUNITY): Payer: Medicare HMO

## 2016-10-02 ENCOUNTER — Encounter (HOSPITAL_COMMUNITY): Payer: Self-pay | Admitting: Orthopedic Surgery

## 2016-10-02 DIAGNOSIS — M79609 Pain in unspecified limb: Secondary | ICD-10-CM

## 2016-10-02 LAB — GLUCOSE, CAPILLARY
GLUCOSE-CAPILLARY: 176 mg/dL — AB (ref 65–99)
Glucose-Capillary: 156 mg/dL — ABNORMAL HIGH (ref 65–99)
Glucose-Capillary: 197 mg/dL — ABNORMAL HIGH (ref 65–99)
Glucose-Capillary: 116 mg/dL — ABNORMAL HIGH (ref 65–99)

## 2016-10-02 MED ORDER — CHLORPROMAZINE HCL 25 MG PO TABS
25.0000 mg | ORAL_TABLET | Freq: Once | ORAL | Status: AC
Start: 2016-10-02 — End: 2016-10-02
  Administered 2016-10-02: 25 mg via ORAL
  Filled 2016-10-02: qty 1

## 2016-10-02 MED ORDER — HYDROMORPHONE HCL 1 MG/ML IJ SOLN
1.0000 mg | INTRAMUSCULAR | Status: DC | PRN
  Administered 2016-10-02: 1 mg via INTRAVENOUS
  Filled 2016-10-02: qty 1

## 2016-10-02 NOTE — Evaluation (Signed)
Physical Therapy Evaluation & Discharge Patient Details Name: Gary DrumJerry D Chumney MRN: [id number] DOB: 05-17-1971 Today's Date: 10/02/2016   History of Present Illness  Patient is a 46 y/o male with history of DM and sleep apnea and prior lumbar surgery now s/p XLIF L3-4, PSFI L3-4 and removal of posterior instrumentation  Clinical Impression  Patient presents at supervision level for mobility for safety.  Wife will be present at d/c for several days prior to return to work.  Feel safe for mobility and will have walker at home.  May need 3:1 due to low toilets.  No further skilled PT needs at this time.  Will sign off.     Follow Up Recommendations No PT follow up    Equipment Recommendations  3in1 (PT)    Recommendations for Other Services       Precautions / Restrictions Precautions Precautions: Back Precaution Booklet Issued: No Required Braces or Orthoses: Spinal Brace Spinal Brace: Lumbar corset;Applied in sitting position      Mobility  Bed Mobility Overal bed mobility: Needs Assistance Bed Mobility: Rolling;Sidelying to Sit Rolling: Supervision Sidelying to sit: Supervision       General bed mobility comments: cues for technique  Transfers Overall transfer level: Needs assistance Equipment used: None Transfers: Sit to/from Stand Sit to Stand: Min guard         General transfer comment: increased time, hand on L knee and time for hip extension on that side due to pain; simulated car transfer on edge of tub bench with cues and increased time  Ambulation/Gait Ambulation/Gait assistance: Supervision Ambulation Distance (Feet): 250 Feet Assistive device: Rolling walker (2 wheeled) Gait Pattern/deviations: Step-through pattern;Decreased stride length;Trunk flexed     General Gait Details: cues for posture and forward gaze, adjusted walker height with improved posture  Stairs Stairs: Yes Stairs assistance: Supervision Stair Management: Step to  pattern;Sideways;Forwards;One rail Left;Two rails Number of Stairs: 4 General stair comments: initially two rails to simulate home entry with step to pattern, then with one rail sideways to simulate accessing sunken living room   Wheelchair Mobility    Modified Rankin (Stroke Patients Only)       Balance Overall balance assessment: Needs assistance   Sitting balance-Leahy Scale: Good       Standing balance-Leahy Scale: Fair                               Pertinent Vitals/Pain Pain Assessment: 0-10 Pain Score: 5  Pain Location: L side Pain Descriptors / Indicators: Discomfort;Operative site guarding Pain Intervention(s): Monitored during session    Home Living Family/patient expects to be discharged to:: Private residence Living Arrangements: Spouse/significant other Available Help at Discharge: Family;Available 24 hours/day (till tuesday) Type of Home: House Home Access: Stairs to enter Entrance Stairs-Rails: Doctor, general practiceight;Left Entrance Stairs-Number of Steps: 7 Home Layout: One level (sunken living room 2 steps down with rail) Home Equipment: Cane - single point;Walker - 2 wheels (borrorwed RW)      Prior Function Level of Independence: Independent               Hand Dominance   Dominant Hand: Right    Extremity/Trunk Assessment        Lower Extremity Assessment Lower Extremity Assessment: LLE deficits/detail LLE Deficits / Details: reports numbness and pain L hip/thigh       Communication   Communication: No difficulties  Cognition   Behavior During Therapy: WFL for tasks assessed/performed Overall  Cognitive Status: Within Functional Limits for tasks assessed                      General Comments      Exercises     Assessment/Plan    PT Assessment Patent does not need any further PT services  PT Problem List            PT Treatment Interventions      PT Goals (Current goals can be found in the Care Plan section)   Acute Rehab PT Goals PT Goal Formulation: Patient unable to participate in goal setting    Frequency     Barriers to discharge        Co-evaluation               End of Session Equipment Utilized During Treatment: Back brace Activity Tolerance: Patient tolerated treatment well Patient left: in chair;with call bell/phone within reach;with family/visitor present           Time: 4098-1191 PT Time Calculation (min) (ACUTE ONLY): 24 min   Charges:   PT Evaluation $PT Eval Moderate Complexity: 1 Procedure PT Treatments $Gait Training: 8-22 mins   PT G CodesElray Mcgregor Oct 04, 2016, 10:06 AM  Sheran Lawless, PT [phone] 10/04/2016

## 2016-10-02 NOTE — Progress Notes (Signed)
    Subjective: Procedure(s) (LRB): XLIF L3-4 ANTERIOR LATERAL LUMBAR FUSION 1 LEVEL (N/A) Removal of pedical screws L4-5 HARDWARE REMOVAL (N/A) PSFI L3-4 POSTERIOR LUMBAR FUSION 1 LEVEL (N/A) 1 Day Post-Op  Patient reports pain as 3 on 0-10 scale.  Reports decreased leg pain reports incisional back pain   Positive void Negative bowel movement Positive flatus Negative chest pain or shortness of breath  Objective: Vital signs in last 24 hours: Temp:  [97.1 F (36.2 C)-99.2 F (37.3 C)] 99.2 F (37.3 C) (01/19 0549) Pulse Rate:  [70-104] 73 (01/19 0549) Resp:  [9-24] 18 (01/19 0549) BP: (111-149)/(73-102) 140/82 (01/19 0549) SpO2:  [90 %-100 %] 93 % (01/19 0549) Weight:  [103.9 kg (229 lb)] 103.9 kg (229 lb) (01/19 0500)  Intake/Output from previous day: 01/18 0701 - 01/19 0700 In: 3547.7 [P.O.:240; I.V.:2607.7; IV Piggyback:700] Out: 2860 [Urine:2660; Blood:200]  Labs: No results for input(s): WBC, RBC, HCT, PLT in the last 72 hours. No results for input(s): NA, K, CL, CO2, BUN, CREATININE, GLUCOSE, CALCIUM in the last 72 hours. No results for input(s): LABPT, INR in the last 72 hours.  Physical Exam: Neurologically intact ABD soft Intact pulses distally Incision: dressing C/D/I Compartment soft  Assessment/Plan: Patient stable  xrays pending Continue mobilization with physical therapy Continue care  Advance diet Up with therapy Plan for discharge tomorrow  Venita Lickahari Delilah Mulgrew, MD [facility] [phone]

## 2016-10-02 NOTE — Progress Notes (Signed)
*  PRELIMINARY RESULTS* Vascular Ultrasound Bilateral lower extremity venous duplex has been completed.  Preliminary findings: No evidence of deep vein thrombosis or baker's cysts bilaterally.   Gary FischerCharlotte C Zakhi Clark 10/02/2016, 2:36 PM

## 2016-10-02 NOTE — Care Management Note (Signed)
Case Management Note  Patient Details  Name: Gary Clark MRN: [id number] Date of Birth: August 18, 1971  Subjective/Objective:               Patient was admitted for a lumbar fusion with hardware removal. Lives at home with spouse. CM will follow for discharge needs pending PT/OT evals and physician orders.      Action/Plan:   Expected Discharge Date:                  Expected Discharge Plan:     In-House Referral:     Discharge planning Services     Post Acute Care Choice:    Choice offered to:     DME Arranged:    DME Agency:     HH Arranged:    HH Agency:     Status of Service:     If discussed at [employer] Length of Stay Meetings, dates discussed:    Additional Comments:  Anda KraftRobarge, Gary Steenson C, RN 10/02/2016, 10:35 AM

## 2016-10-02 NOTE — Op Note (Signed)
NAMCoralie Keens:  Conaty, Stark                 ACCOUNT NO.:  [id number]  MEDICAL RECORD NO.:  [id number]  LOCATION:                                 FACILITY:  PHYSICIAN:  Chandler Stofer D. Shon BatonBrooks, M.D. DATE OF BIRTH:  [birth year]  DATE OF PROCEDURE:  10/01/2016 DATE OF DISCHARGE:                              OPERATIVE REPORT   POSTOPERATIVE DIAGNOSIS:  Adjacent segment degenerative disk disease, L3- 4 with neuropathic left leg pain.  POSTOPERATIVE DIAGNOSIS:  Adjacent segment degenerative disk disease, L3- 4 with neuropathic left leg pain.  OPERATIVE PROCEDURES: 1. Lateral interbody fusion at L3-4. 2. Removal of posterior pedicle screw hardware construct, L4-5. 3. Posterior fusion with pedicle screw fixation, L3-4.  COMPLICATIONS:  None.  CONDITION:  Stable.  FIRST ASSISTANT:  Anette Riedelarmen Mayo, my PA.  Intraoperative neuromonitoring during both lateral and posterior portions of the surgery were completed.  There was no abnormal free running EMG activity.  IMPLANT SYSTEM USED:  NuVasive Titanium size 12 x 22 lordotic intervertebral cage packed with allograft bone (Vivigen, Osteocel), removal of Synthes pedicle screw matrix system at L4 and L5, implantation of NuVasive pedicle screws.  At L4, I used a 7.5, 40 mm screw and at L3, I used a 6.5 diameter 40 mm screw.  OPERATIVE REPORT:  The patient was brought to the operating room and placed supine on the operating table.  After successful induction of general anesthesia and endotracheal intubation, Ted's SCDs, and a Foley were inserted.  The patient was turned into the lateral decubitus position left side up.  An axillary roll was placed as was and a pad was placed into the knee and ankle.  Pillow was placed between the legs and the left arm was placed in the well arm holder.  The patient was then taped down to the table and secured after confirming satisfactory positioning with AP and lateral fluoroscopy.  The lateral flank was then prepped and  draped in a standard fashion.  Time-out was taken to confirm patient, procedure, and all other pertinent important data.  Once this was completed, an incision was made centered over the L3-4 disk space. Sharp dissection was carried down through the deep fascia to the external oblique fascia.  A second incision 1 fingerbreadth posteriorly was made and bluntly dissected down to the retroperitoneal fascia.  I popped into the retroperitoneum and then used my finger to mobilize the retroperitoneum.  I then brought my finger to the undersurface of the oblique and dissected through bluntly.  I then brought the 1st stroke, the 1st dilating tube down to the surface of the iliopsoas and stimulated the surface of the psoas over the L3-4 disk space.  Once I confirmed, I was not indeed injuring the plexus, I then advanced through the psoas down to the disk space.  I then again stimulated circumferentially confirming that the plexus was not under compression. I then sequentially dilated up and then placed my working retractor down.  Once I confirmed satisfactory position in both planes, I secured the 2 retractors to the bed and then stimulated behind the retractor.  I did get some stimulation indicating that the plexus was posterior to my retracting blade.  I then placed the Shim to lock into the disk space and then clearly visualized the entire lateral aspect of the L3-4 disk space.  An annulotomy with a 10 blade scalpel was performed and then using a combination of pituitary rongeurs, curettes and Kerrison rongeurs, I removed all the disk material.  Using a Cobb elevator, I released the contralateral annulus.  I scraped the endplates, so I had confirmed bleeding subchondral bone.  I then sequentially trialed spacers starting with the 8 x 22 spacer and ending with the 12 lordotic x 22 spacer. This provided excellent fixation.  I obtained the actual implant, packed it with the bone graft substitute  and then malleted it to the appropriate depth.  This had excellent fixation.  At this point, I irrigated the wound copiously with normal saline and then made sure hemostasis using bipolar electrocautery.  I removed the trocar and the working retractor and then closed the external oblique fascia with interrupted #1 Vicryl sutures.  I then closed superficial with 2-0 Vicryl suture and a 3-0 Monocryl.  I also closed the 2nd posterior wound in similar fashion.  At this point with all wounds closed, I placed a Tegaderm over the wound and then the patient was turned back into the supine position.  Jackson table with the spine frame was brought in and the patient was then turned prone onto the spine frame.  Again, all bony prominences were well padded and the back was prepped and draped in a standard fashion. A second time-out was done confirming the 2nd procedure which was the removal of hardware and implantation of new pedicle screws.  I identified using fluoro, the lateral aspect of the L3 pedicle, made a small incision and advanced the Jamshidi needle percutaneously down to the lateral aspect of the pedicle.  I then connected the Jamshidi to the neuromonitoring device and using fluoro and real-time monitoring, I advanced the Jamshidi needle into the pedicle of L3.  Once I approached the medial border, I switched to a lateral view to ensure that I was just beyond the posterior margin of the vertebral body.  Once I confirmed satisfactory trajectory, I advanced into the vertebral body and placed my guidepin.  I then made a second incision slightly larger over the L4-5 screws.  I sharply dissected down, exposed the screw heads.  I removed the locking nuts as well as the rod.  I then removed the L5 screw and placed bone wax over the pedicle hole.  I removed the L4 screw and then tapped that screw hole with a 6.5 diameter tap.  I then placed a 40 mm 7.5 diameter NuVasive screw into that hole  and had excellent purchase.  I then tapped over the L3 guidepin and then placed the 40 mm 6.5 diameter screw.  I stimulated both screws and there was no adverse activity with greater than 40 milliamps of stimulation.  I then went to the contralateral side and repeated the entire procedure. I again made 2 incisions, 1 smaller 1 to place the percutaneous L3 screw, 1 slightly larger to remove the L4-5 screw and repositioned with the new L4 pedicle screw.  Again using the same technique I had used on the contralateral side, I repeated this and placed the new construct at L3-4.  I again stimulated both screws and there was no activity greater than 40 milliamps.  Rods were then placed and then the locking caps were locked down and torqued off according manufactures standards.  At this  point, the final x-rays were taken which were satisfactory.  The wounds were closed in a layered fashion with interrupted #1 Vicryl suture, 0 Vicryl suture, interrupted 2-0 Vicryl sutures, and a 3-0 Monocryl for the skin.  Steri-Strips and dry dressings were applied and the patient was ultimately extubated and transferred to PACU without incident.  At the end of the case, all needle and sponge counts were correct.     Verlan Grotz D. Shon Baton, M.D.   ______________________________ Donn Pierini. Shon Baton, M.D.    DDB/MEDQ  D:  10/01/2016  T:  10/02/2016  Job:  [id number]

## 2016-10-02 NOTE — Evaluation (Addendum)
Occupational Therapy Evaluation and Discharge Patient Details Name: Gary Clark MRN: [id number] DOB: 1970-10-02 Today's Date: 10/02/2016    History of Present Illness Patient is a 46 y/o male with history of DM and sleep apnea and prior lumbar surgery now s/p XLIF L3-4, PSFI L3-4 and removal of posterior instrumentation   Clinical Impression   This 46 yo male admitted and underwent above presents to acute OT with all education completed, we will D/C from acute OT.    Follow Up Recommendations  No OT follow up;Supervision/Assistance - 24 hour    Equipment Recommendations  3 in 1 bedside commode       Precautions / Restrictions Precautions Precautions: Back Precaution Booklet Issued: Yes (comment) Required Braces or Orthoses: Spinal Brace Spinal Brace: Lumbar corset;Applied in sitting position Restrictions Weight Bearing Restrictions: No      Mobility Bed Mobility       General bed mobility comments: pt up in recliner upon arrival  Transfers Overall transfer level: Needs assistance Equipment used: Rolling walker (2 wheeled) Transfers: Sit to/from Stand Sit to Stand: Supervision                 ADL Overall ADL's : Needs assistance/impaired Eating/Feeding: Independent;Sitting   Grooming: Set up;Sitting                   Toilet Transfer: Supervision/safety;BSC;RW       Tub/ Engineer, structural: Tub Lexicographer Details (indicate cue type and reason): educated on side step into tub; family going to check to see if they have a seat for tub they can use or may get one on their own   General ADL Comments: Educated pt on use of two cups for brushing teeth avoid bending over sink (1 for spit and 1 for rinse); use of wet wipes for back peri-care (and toilet aid); most efficient sequence of getting dressed; feet slightly turned out for sit<>stand from toilet. Pt educated on use of AE for LBD and will decide if he will  get any of it.      Pertinent Vitals/Pain Pain Assessment: 0-10 Pain Score: 5  Pain Location: L side Pain Descriptors / Indicators: Discomfort;Operative site guarding Pain Intervention(s): Monitored during session;Repositioned     Hand Dominance Right   Extremity/Trunk Assessment Upper Extremity Assessment Upper Extremity Assessment: Overall WFL for tasks assessed     Communication Communication Communication: No difficulties   Cognition Arousal/Alertness: Awake/alert Behavior During Therapy: WFL for tasks assessed/performed Overall Cognitive Status: Within Functional Limits for tasks assessed                                Home Living Family/patient expects to be discharged to:: Private residence Living Arrangements: Spouse/significant other Available Help at Discharge: Family;Available 24 hours/day (till tuesday) Type of Home: House Home Access: Stairs to enter [employer] of Steps: 7 Entrance Stairs-Rails: Right;Left Home Layout: One level (sunken living room 2 steps down with rail)     Bathroom Shower/Tub: Chief Strategy Officer: Standard     Home Equipment: Cane - single point;Walker - 2 wheels (borrorwed RW)          Prior Functioning/Environment Level of Independence: Independent                 OT Problem List: Decreased range of motion;Impaired balance (sitting and/or standing);Pain      OT Goals(Current goals can be  found in the care plan section) Acute Rehab OT Goals Patient Stated Goal: home in next day or 2  OT Frequency:                End of Session Equipment Utilized During Treatment: Rolling walker;Back brace  Activity Tolerance: Patient tolerated treatment well Patient left: in chair;with call bell/phone within reach;with family/visitor present   Time: 1001-1036 OT Time Calculation (min): 35 min Charges:  OT General Charges $OT Visit: 1 Procedure OT Evaluation $OT Eval Moderate  Complexity: 1 Procedure OT Treatments $Self Care/Home Management : 8-22 mins  Evette GeorgesLeonard, [name] [phone] 10/02/2016, 12:39 PM

## 2016-10-03 LAB — GLUCOSE, CAPILLARY
GLUCOSE-CAPILLARY: 111 mg/dL — AB (ref 65–99)
GLUCOSE-CAPILLARY: 148 mg/dL — AB (ref 65–99)

## 2016-10-03 LAB — HEMOGLOBIN A1C
Hgb A1c MFr Bld: 5.9 % — ABNORMAL HIGH (ref 4.8–5.6)
MEAN PLASMA GLUCOSE: 123 mg/dL

## 2016-10-03 MED ORDER — METHOCARBAMOL 500 MG PO TABS: 500.0000 mg | ORAL_TABLET | Freq: Four times a day (QID) | ORAL | 0 refills | Status: DC | PRN

## 2016-10-03 NOTE — Discharge Summary (Signed)
Physician Discharge Summary   Patient ID: Gary Clark MRN: [id number] DOB/AGE: [birth year] 46 y.o.  Admit date: 10/01/2016 Discharge date: 10/03/2016  Admission Diagnoses:  Active Problems:   Back pain   Discharge Diagnoses:  Same   Surgeries: Procedure(s): XLIF L3-4 ANTERIOR LATERAL LUMBAR FUSION 1 LEVEL Removal of pedical screws L4-5 HARDWARE REMOVAL PSFI L3-4 POSTERIOR LUMBAR FUSION 1 LEVEL on 10/01/2016   Consultants: PT/OT  Discharged Condition: Stable  Hospital Course: Gary Clark is an 46 y.o. male who was admitted 10/01/2016 with a chief complaint of lumbar pain, and found to have a diagnosis of degenerative disc disease.  They were brought to the operating room on 10/01/2016 and underwent the above named procedures.    The patient had an uncomplicated hospital course and was stable for discharge.  Recent vital signs:  Vitals:   10/03/16 0434 10/03/16 0500  BP: 121/70 (!) 146/83  Pulse: 99 77  Resp: 18 18  Temp: 98.2 F (36.8 C) 97.5 F (36.4 C)    Recent laboratory studies:  Results for orders placed or performed during the hospital encounter of 10/01/16  Glucose, capillary  Result Value Ref Range   Glucose-Capillary 126 (H) 65 - 99 mg/dL  Glucose, capillary  Result Value Ref Range   Glucose-Capillary 170 (H) 65 - 99 mg/dL  Glucose, capillary  Result Value Ref Range   Glucose-Capillary 153 (H) 65 - 99 mg/dL  Hemoglobin [id number]  Result Value Ref Range   Hgb A1c MFr Bld 5.9 (H) 4.8 - 5.6 %   Mean Plasma Glucose 123 mg/dL  Glucose, capillary  Result Value Ref Range   Glucose-Capillary 165 (H) 65 - 99 mg/dL  Glucose, capillary  Result Value Ref Range   Glucose-Capillary 176 (H) 65 - 99 mg/dL   Comment 1 Notify RN    Comment 2 Document in Chart   Glucose, capillary  Result Value Ref Range   Glucose-Capillary 156 (H) 65 - 99 mg/dL  Glucose, capillary  Result Value Ref Range   Glucose-Capillary 197 (H) 65 - 99 mg/dL  Glucose, capillary  Result Value  Ref Range   Glucose-Capillary 116 (H) 65 - 99 mg/dL   Comment 1 Notify RN    Comment 2 Document in Chart   Glucose, capillary  Result Value Ref Range   Glucose-Capillary 111 (H) 65 - 99 mg/dL   Comment 1 Notify RN    Comment 2 Document in Chart     Discharge Medications:   Allergies as of 10/03/2016      Reactions   Codeine Other (See Comments)   Gets "loopy"      Medication List    STOP taking these medications   acetaminophen 500 MG tablet Commonly known as:  TYLENOL   gabapentin 400 MG capsule Commonly known as:  NEURONTIN   ibuprofen 200 MG tablet Commonly known as:  ADVIL,MOTRIN   meloxicam 15 MG tablet Commonly known as:  MOBIC     TAKE these medications   citalopram 40 MG tablet Commonly known as:  CELEXA Take 1 tablet (40 mg total) by mouth daily.   hydrochlorothiazide 25 MG tablet Commonly known as:  HYDRODIURIL Take 1 tablet (25 mg total) by mouth daily.   lisinopril 20 MG tablet Commonly known as:  PRINIVIL,ZESTRIL Take 1.5 tablets (30 mg total) by mouth daily. What changed:  how much to take   metFORMIN 500 MG tablet Commonly known as:  GLUCOPHAGE Take 1 tablet (500 mg total) by mouth 2 (two) times daily  with a meal. What changed:  when to take this   methocarbamol 500 MG tablet Commonly known as:  ROBAXIN Take 1 tablet (500 mg total) by mouth 3 (three) times daily as needed for muscle spasms.   methocarbamol 500 MG tablet Commonly known as:  ROBAXIN Take 1 tablet (500 mg total) by mouth every 6 (six) hours as needed for muscle spasms.   ondansetron 4 MG tablet Commonly known as:  ZOFRAN Take 1 tablet (4 mg total) by mouth every 8 (eight) hours as needed for nausea or vomiting.   oxyCODONE-acetaminophen 10-325 MG tablet Commonly known as:  PERCOCET Take 1 tablet by mouth every 4 (four) hours as needed for pain.   rosuvastatin 40 MG tablet Commonly known as:  CRESTOR Take 1 tablet (40 mg total) by mouth daily.   sildenafil 20 MG  tablet Commonly known as:  REVATIO Take 2-5 tablets (40-100mg ) as needed prior to sexual activity. Use lowest effective dose. Do not take more then one dose in 24 hours.       Diagnostic Studies: Dg Lumbar Spine 2-3 Views  Result Date: 10/02/2016 CLINICAL DATA:  46 year old male status post XLIF at L4-L5 and and removal of hardware, as well as PLIF at L3-L4. EXAM: LUMBAR SPINE - 2-3 VIEW COMPARISON:  Lumbar spine radiograph 10/01/2016. FINDINGS: Postoperative changes of XLIF are noted at L4-L5, with an interbody graft at the L4-L5 interspace. There are also postoperative changes of PLIF at L3-L4, with an interbody cage at the L3-L4 interspace. Alignment is anatomic. No acute hardware complication or other acute abnormalities. Multilevel degenerative disc disease throughout the lumbar spine with extensive multilevel facet arthropathy. IMPRESSION: 1. Postoperative changes as above, without acute complicating features. Electronically Signed   By: Trudie Reed M.D.   On: 10/02/2016 09:48   Dg Lumbar Spine 2-3 Views  Result Date: 10/01/2016 CLINICAL DATA:  Lumbar spine surgery. EXAM: LUMBAR SPINE - 2-3 VIEW; DG C-ARM GT 120 MIN FLUOROSCOPY TIME:  C-arm fluoroscopic images were obtained intraoperatively and submitted for post operative interpretation. Please see the performing provider's procedural report for the fluoroscopy time utilized. COMPARISON:  Lumbar spine MRI 05/01/2016 FINDINGS: Two intraoperative spot fluoroscopic images of the lumbar spine are provided. Numbering as on the prior MRI. An anterior fusion device is again seen at L4-5. L5 pedicle screws have been removed. L4 pedicle screws remain in place. Interval L3-4 fusion has been performed with placement of pedicle screws at L3 and an interbody cage at L3-4. IMPRESSION: Intraoperative images during L3-4 fusion. Electronically Signed   By: Sebastian Ache M.D.   On: 10/01/2016 11:34   Dg C-arm Gt 120 Min  Result Date: 10/01/2016 CLINICAL  DATA:  Lumbar spine surgery. EXAM: LUMBAR SPINE - 2-3 VIEW; DG C-ARM GT 120 MIN FLUOROSCOPY TIME:  C-arm fluoroscopic images were obtained intraoperatively and submitted for post operative interpretation. Please see the performing provider's procedural report for the fluoroscopy time utilized. COMPARISON:  Lumbar spine MRI 05/01/2016 FINDINGS: Two intraoperative spot fluoroscopic images of the lumbar spine are provided. Numbering as on the prior MRI. An anterior fusion device is again seen at L4-5. L5 pedicle screws have been removed. L4 pedicle screws remain in place. Interval L3-4 fusion has been performed with placement of pedicle screws at L3 and an interbody cage at L3-4. IMPRESSION: Intraoperative images during L3-4 fusion. Electronically Signed   By: Sebastian Ache M.D.   On: 10/01/2016 11:34    Disposition: 01-Home or Self Care  Discharge Instructions  Call MD / Call 911    Complete by:  As directed    If you experience chest pain or shortness of breath, CALL 911 and be transported to the hospital emergency room.  If you develope a fever above 101 F, pus (white drainage) or increased drainage or redness at the wound, or calf pain, call your surgeon's office.   Constipation Prevention    Complete by:  As directed    Drink plenty of fluids.  Prune juice may be helpful.  You may use a stool softener, such as Colace (over the counter) 100 mg twice a day.  Use MiraLax (over the counter) for constipation as needed.   Diet - low sodium heart healthy    Complete by:  As directed    Incentive spirometry RT    Complete by:  As directed    Increase activity slowly as tolerated    Complete by:  As directed       Follow-up Information    BROOKS,DAHARI D, MD. Schedule an appointment as soon as possible for a visit in 2 weeks.   Specialty:  Orthopedic Surgery Why:  If symptoms worsen, For suture removal, For wound re-check Contact information: [address] [place]  [address] [phone]            Signed: Thea Gist 10/03/2016, 9:18 AM

## 2016-10-03 NOTE — Progress Notes (Signed)
Patient ready for discharge to home; discharge instructions given and Rx's given by Bettye BoeckKehinde Kirk,RN; patient received his 3 in 1 commode chair; patient discharged out via wheelchair accompanied home by his wife and family.

## 2016-10-03 NOTE — Progress Notes (Signed)
   Subjective: 2 Days Post-Op Procedure(s) (LRB): XLIF L3-4 ANTERIOR LATERAL LUMBAR FUSION 1 LEVEL (N/A) Removal of pedical screws L4-5 HARDWARE REMOVAL (N/A) PSFI L3-4 POSTERIOR LUMBAR FUSION 1 LEVEL (N/A)  Pt doing well Pain under control Ready for d/c  Patient reports pain as mild.  Objective:   VITALS:   Vitals:   10/03/16 0434 10/03/16 0500  BP: 121/70 (!) 146/83  Pulse: 99 77  Resp: 18 18  Temp: 98.2 F (36.8 C) 97.5 F (36.4 C)    Lumbar incision healing well nv intact distally Slight numbness to left leg as per his baseline prior to surgery No rashes or edema  LABS No results for input(s): HGB, HCT, WBC, PLT in the last 72 hours.  No results for input(s): NA, K, BUN, CREATININE, GLUCOSE in the last 72 hours.   Assessment/Plan: 2 Days Post-Op Procedure(s) (LRB): XLIF L3-4 ANTERIOR LATERAL LUMBAR FUSION 1 LEVEL (N/A) Removal of pedical screws L4-5 HARDWARE REMOVAL (N/A) PSFI L3-4 POSTERIOR LUMBAR FUSION 1 LEVEL (N/A) D/c home today F/u in 2 weeks with Dr. Linus SalmonsBrooks    Brad Witney Huie, MPAS, PA-C  10/03/2016, 9:14 AM

## 2016-10-03 NOTE — Care Management Note (Signed)
Case Management Note  Patient Details  Name: Gary Clark MRN: [id number] Date of Birth: 05-30-1971  Subjective/Objective:                  s/p XLIF L3-4, PSFI L3-4 and removal of posterior instrumentation Action/Plan: Discharge planning Expected Discharge Date:  10/03/16               Expected Discharge Plan:  Home/Self Care  In-House Referral:     Discharge planning Services  CM Consult  Post Acute Care Choice:  Durable Medical Equipment Choice offered to:  Patient  DME Arranged:  3-N-1 DME Agency:  [facility]  HH Arranged:  NA HH Agency:  NA  Status of Service:  Completed, signed off  If discussed at Long Length of Stay Meetings, dates discussed:    Additional Comments: CM received call from RN, Marylene LandAngela requesting 3n1 for pt to discharge.  CM notified [facility]  DME rep, Reggie to please deliver the 3n1 to room prior to discharge. NO other CM needs were communicated. Yves DillJeffries, Marvelous Woolford Christine, RN 10/03/2016, 10:42 AM

## 2016-11-05 DIAGNOSIS — G8929 Other chronic pain: Secondary | ICD-10-CM | POA: Diagnosis not present

## 2016-11-05 DIAGNOSIS — M5442 Lumbago with sciatica, left side: Secondary | ICD-10-CM | POA: Diagnosis not present

## 2016-12-24 ENCOUNTER — Other Ambulatory Visit: Payer: Self-pay | Admitting: Family Medicine

## 2017-02-11 IMAGING — DX DG FOREARM 2V*L*
2 series · 2 of 2 positions shown · non-contrast
Comparison: None.

CLINICAL DATA: Fell on outstretched arm today. Forearm and wrist
pain.

EXAM:
LEFT FOREARM - 2 VIEW

[x forearm ap left]
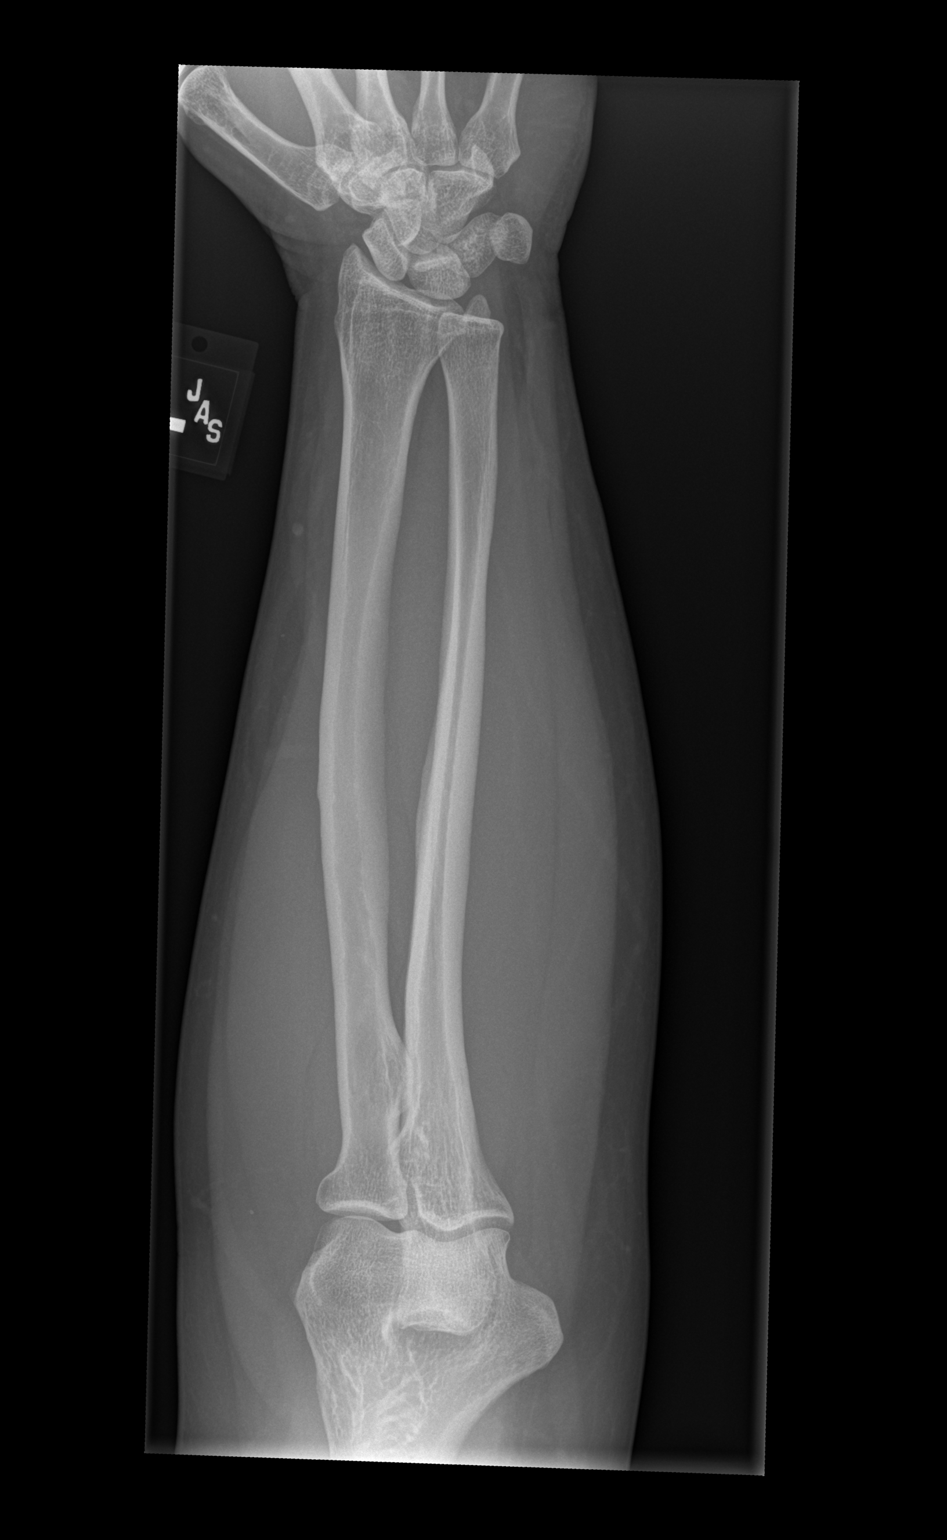

[x forearm lat left]
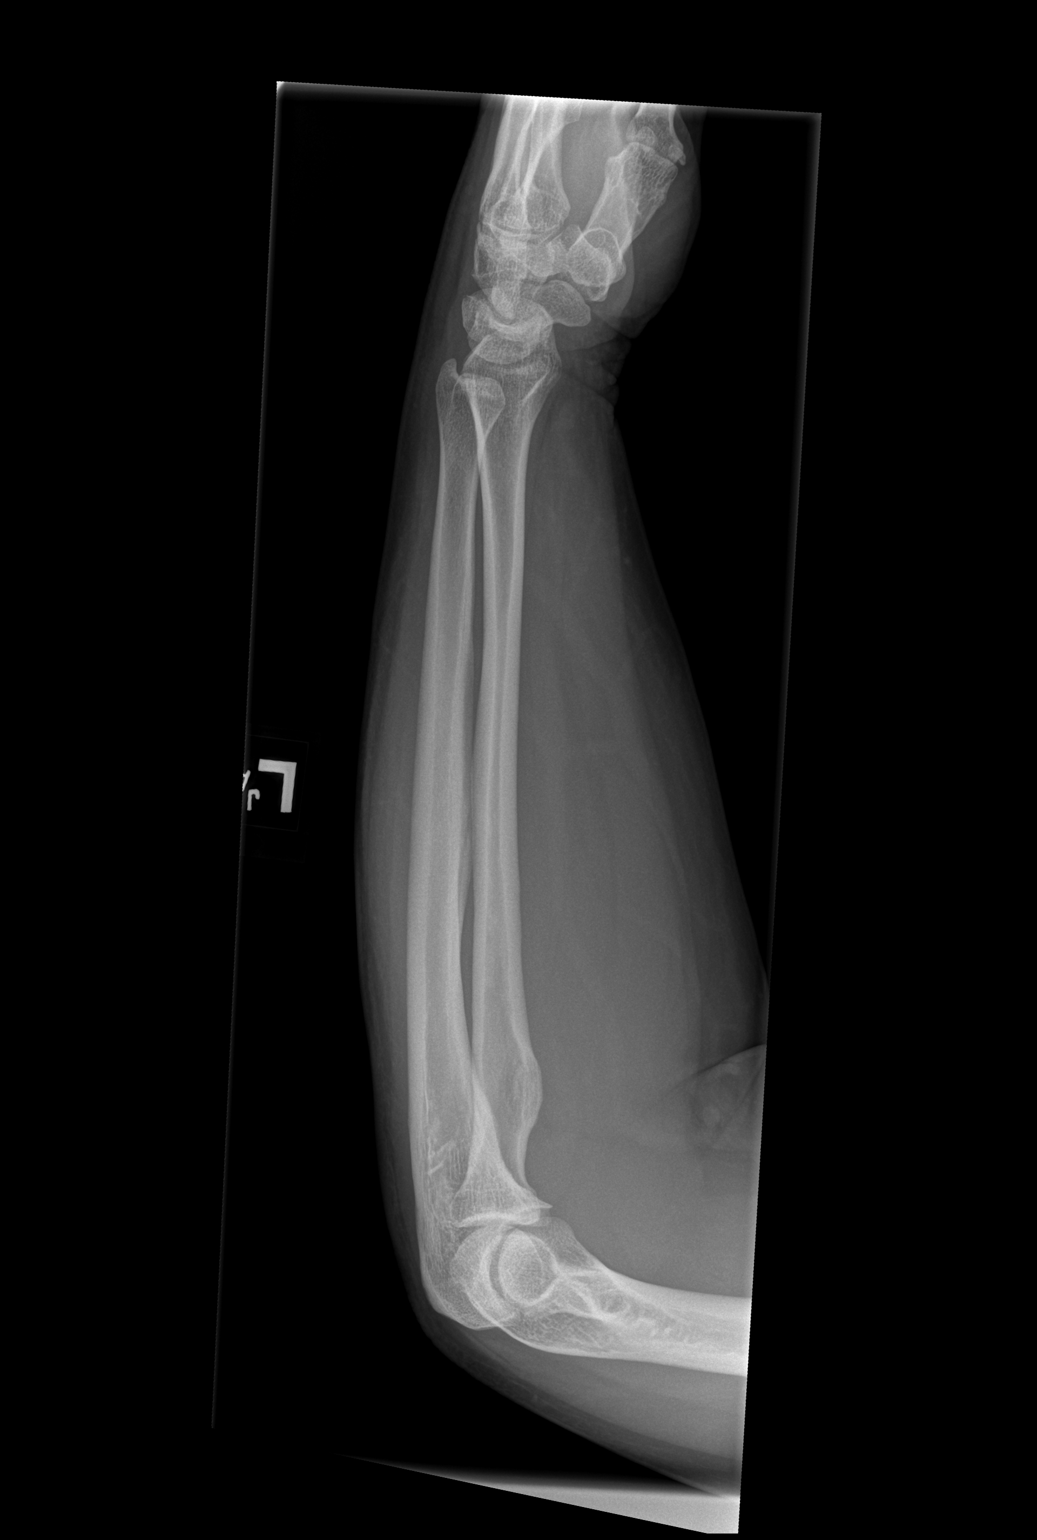

[2 of 2 positions shown; findings below may reference images not displayed]

FINDINGS: There is no evidence of fracture or other focal bone lesions. Soft
tissues are unremarkable.
IMPRESSION: Negative.

## 2017-02-19 ENCOUNTER — Other Ambulatory Visit: Payer: Self-pay | Admitting: Family Medicine

## 2017-02-19 NOTE — Addendum Note (Signed)
Addendum  created 02/19/17 0926 by Zeth Buday, MD   Sign clinical note    

## 2017-03-22 ENCOUNTER — Telehealth: Payer: Self-pay | Admitting: Family Medicine

## 2017-03-22 MED ORDER — CITALOPRAM HYDROBROMIDE 40 MG PO TABS: 40.0000 mg | ORAL_TABLET | Freq: Every day | ORAL | 0 refills | Status: DC

## 2017-03-22 NOTE — Telephone Encounter (Signed)
Pt has an appt with dr Selena Battenkim on 04-02-17 and needs a refill on citalopram 40 mg [facility]. Pt is out

## 2017-03-23 MED ORDER — CITALOPRAM HYDROBROMIDE 40 MG PO TABS: 40.0000 mg | ORAL_TABLET | Freq: Every day | ORAL | 0 refills | Status: DC

## 2017-03-23 NOTE — Addendum Note (Signed)
Addended by: Johnella MoloneyFUNDERBURK, Vermon Grays A on: 03/23/2017 06:52 AM   Modules accepted: Orders

## 2017-03-23 NOTE — Telephone Encounter (Signed)
Rx done. 

## 2017-04-01 NOTE — Progress Notes (Signed)
HPI:  Not seen in some time. Due for AWV/CPE in October. PMH sig for Prediabetes, HTN, HLD, Depression, Sleep apnea, chronic back pain. Reports doing well. Walking on a regular basis and trying to eat healthy. No complaints today. Back surgery went well and feels great. Mood great.. Denies CP, SOB, DOE, Depression. Due for labs.  ROS: See pertinent positives and negatives per HPI.  Past Medical History:  Diagnosis Date  . Allergy   . Anxiety   . Arthritis   . Asthma   . Back pain   . Blood in stool   . Cluster headache   . Depression   . Headache(784.0)    frequent   . Hyperlipidemia   . Hypertension   . Lumbosacral spondylosis without myelopathy 06/15/2013  . Migraines   . Postlaminectomy syndrome, lumbar region 03/08/2015   Status post L4-L5 decompression and fusion, anterior/posterior in 2011   . Pre-diabetes    pre diabetic per his PCP  . Sleep apnea    tested in 2014 positive   . Stroke [facility])    pt states "mini-Stroke"  . UTI (urinary tract infection)     Past Surgical History:  Procedure Laterality Date  . ANTERIOR AND POSTERIOR SPINAL FUSION  2011  . ANTERIOR LAT LUMBAR FUSION N/A 10/01/2016   Procedure: XLIF L3-4 ANTERIOR LATERAL LUMBAR FUSION 1 LEVEL;  Surgeon: Venita Lick, MD;  Location: [facility] OR;  Service: Orthopedics;  Laterality: N/A;  . BACK SURGERY    . HARDWARE REMOVAL N/A 10/01/2016   Procedure: Removal of pedical screws L4-5 HARDWARE REMOVAL;  Surgeon: Venita Lick, MD;  Location: [facility] OR;  Service: Orthopedics;  Laterality: N/A;  . LUMBAR LAMINECTOMY/DECOMPRESSION MICRODISCECTOMY  2010    Family History  Problem Relation Age of Onset  . Heart disease Mother   . Hypertension Mother   . Arthritis Father   . Hypertension Father   . Prostate cancer Father   . Miscarriages / Stillbirths Maternal Grandmother   . Heart disease Maternal Grandfather   . Sudden death Maternal Grandfather   . Breast cancer Sister   . Migraines Sister     Social  History   Social History  . Marital status: Married    Spouse name: [name]  . Number of children: 1  . Years of education: 12th   Occupational History  .      n/a   Social History Main Topics  . Smoking status: Never Smoker  . Smokeless tobacco: Never Used  . Alcohol use Yes     Comment: once a month  . Drug use: No  . Sexual activity: Yes    Birth control/ protection: None   Other Topics Concern  . None   Social History Narrative   Patient lives at home with his family.   Caffeine Use: 2 cups daily     Current Outpatient Prescriptions:  .  citalopram (CELEXA) 40 MG tablet, Take 1 tablet (40 mg total) by mouth daily., Disp: 30 tablet, Rfl: 0 .  hydrochlorothiazide (HYDRODIURIL) 25 MG tablet, Take 1 tablet (25 mg total) by mouth daily., Disp: 90 tablet, Rfl: 1 .  hydrochlorothiazide (HYDRODIURIL) 25 MG tablet, TAKE ONE TABLET BY MOUTH ONCE DAILY, Disp: 90 tablet, Rfl: 0 .  lisinopril (PRINIVIL,ZESTRIL) 20 MG tablet, Take 1.5 tablets (30 mg total) by mouth daily. (Patient taking differently: Take 20 mg by mouth daily. ), Disp: 135 tablet, Rfl: 3 .  lisinopril (PRINIVIL,ZESTRIL) 20 MG tablet, TAKE ONE TABLET BY MOUTH DAILY, Disp: 90  tablet, Rfl: 1 .  metFORMIN (GLUCOPHAGE) 500 MG tablet, Take 1 tablet (500 mg total) by mouth 2 (two) times daily with a meal. (Patient taking differently: Take 500 mg by mouth daily with breakfast. ), Disp: 180 tablet, Rfl: 3 .  methocarbamol (ROBAXIN) 500 MG tablet, Take 1 tablet (500 mg total) by mouth 3 (three) times daily as needed for muscle spasms., Disp: 21 tablet, Rfl: 0 .  methocarbamol (ROBAXIN) 500 MG tablet, Take 1 tablet (500 mg total) by mouth every 6 (six) hours as needed for muscle spasms., Disp: 60 tablet, Rfl: 0 .  ondansetron (ZOFRAN) 4 MG tablet, Take 1 tablet (4 mg total) by mouth every 8 (eight) hours as needed for nausea or vomiting., Disp: 20 tablet, Rfl: 0 .  oxyCODONE-acetaminophen (PERCOCET) 10-325 MG tablet, Take 1 tablet  by mouth every 4 (four) hours as needed for pain., Disp: 42 tablet, Rfl: 0 .  rosuvastatin (CRESTOR) 40 MG tablet, Take 1 tablet (40 mg total) by mouth daily., Disp: 90 tablet, Rfl: 3 .  sildenafil (REVATIO) 20 MG tablet, Take 2-5 tablets (40-100mg ) as needed prior to sexual activity. Use lowest effective dose. Do not take more then one dose in 24 hours., Disp: 50 tablet, Rfl: 0  EXAM:  Vitals:   04/02/17 1120  BP: 122/82  Pulse: (!) 102  Temp: 99.3 F (37.4 C)    Body mass index is 36.81 kg/m.  GENERAL: vitals reviewed and listed above, alert, oriented, appears well hydrated and in no acute distress  HEENT: atraumatic, conjunttiva clear, no obvious abnormalities on inspection of external nose and ears  NECK: no obvious masses on inspection  LUNGS: clear to auscultation bilaterally, no wheezes, rales or rhonchi, good air movement  CV: HRRR, no peripheral edema  MS: moves all extremities without noticeable abnormality  PSYCH: pleasant and cooperative, no obvious depression or anxiety  ASSESSMENT AND PLAN:  Discussed the following assessment and plan:  Hyperlipidemia, unspecified hyperlipidemia type  Essential hypertension - Plan: Basic metabolic panel, CBC  Hyperglycemia - Plan: Hemoglobin A1c  Class 2 obesity with serious comorbidity and body mass index (BMI) of 36.0 to 36.9 in adult, unspecified obesity type  Major depressive disorder, recurrent, in remission (HCC)  -doing well -labs -lifestyle recs -cont current meds for now -CPE/AWV in November -Patient advised to return or notify a doctor immediately if symptoms worsen or persist or new concerns arise.  Patient Instructions  BEFORE YOU LEAVE: -follow up: Medicare Exam with Darl Pikes and CPE with Dr. Selena Batten - same day in November. -labs  We have ordered labs or studies at this visit. It can take up to 1-2 weeks for results and processing. IF results require follow up or explanation, we will call you with  instructions. Clinically stable results will be released to your [facility]. If you have not heard from [place] or cannot find your results in [facility] in 2 weeks please contact our office at [phone].  If you are not yet signed up for [facility], please consider signing up.   We recommend the following healthy lifestyle for LIFE: 1) Small portions.   Tip: eat off of a salad plate instead of a dinner plate.  Tip: It is ok to feel hungry after a meal - that likely means you ate an appropriate portion.  Tip: if you need more or a snack choose fruits, veggies and/or a handful of nuts or seeds.  2) Eat a healthy clean diet.   TRY TO EAT: -at least 5-7 servings of  low sugar vegetables per day (not corn, potatoes or bananas.) -berries are the best choice if you wish to eat fruit.   -lean meets (fish, chicken or [place] breasts) -vegan proteins for some meals - beans or tofu, whole grains, nuts and seeds -Replace bad fats with good fats - good fats include: fish, nuts and seeds, canola oil, olive oil -small amounts of low fat or non fat dairy -small amounts of100 % whole grains - check the lables  AVOID: -SUGAR, sweets, anything with added sugar, corn syrup or sweeteners -if you must have a sweetener, small amounts of stevia may be best -sweetened beverages -simple starches (rice, bread, potatoes, pasta, chips, etc - small amounts of 100% whole grains are ok) -red meat, pork, butter -fried foods, fast food, processed food, excessive dairy, eggs and coconut.  3)Get at least 150 minutes of sweaty aerobic exercise per week.  4)Reduce stress - consider counseling, meditation and relaxation to balance other aspects of your life.          Kriste BasqueKIM, HANNAH R., DO

## 2017-04-02 ENCOUNTER — Encounter: Payer: Self-pay | Admitting: Family Medicine

## 2017-04-02 ENCOUNTER — Ambulatory Visit (INDEPENDENT_AMBULATORY_CARE_PROVIDER_SITE_OTHER): Payer: Medicare HMO | Admitting: Family Medicine

## 2017-04-02 VITALS — BP 122/82 | HR 102 | Temp 99.3°F | Ht 65.5 in | Wt 224.6 lb

## 2017-04-02 DIAGNOSIS — I10 Essential (primary) hypertension: Secondary | ICD-10-CM

## 2017-04-02 DIAGNOSIS — E785 Hyperlipidemia, unspecified: Secondary | ICD-10-CM

## 2017-04-02 DIAGNOSIS — F3340 Major depressive disorder, recurrent, in remission, unspecified: Secondary | ICD-10-CM

## 2017-04-02 DIAGNOSIS — Z6836 Body mass index (BMI) 36.0-36.9, adult: Secondary | ICD-10-CM | POA: Diagnosis not present

## 2017-04-02 DIAGNOSIS — E669 Obesity, unspecified: Secondary | ICD-10-CM | POA: Diagnosis not present

## 2017-04-02 DIAGNOSIS — R739 Hyperglycemia, unspecified: Secondary | ICD-10-CM | POA: Diagnosis not present

## 2017-04-02 DIAGNOSIS — IMO0001 Reserved for inherently not codable concepts without codable children: Secondary | ICD-10-CM

## 2017-04-02 LAB — BASIC METABOLIC PANEL
BUN: 21 mg/dL (ref 6–23)
CALCIUM: 10.3 mg/dL (ref 8.4–10.5)
CHLORIDE: 102 meq/L (ref 96–112)
CO2: 30 meq/L (ref 19–32)
CREATININE: 1.04 mg/dL (ref 0.40–1.50)
GFR: 81.31 mL/min (ref >60.00)
GLUCOSE: 111 mg/dL — AB (ref 70–99)
Potassium: 4.4 mEq/L (ref 3.5–5.1)
SODIUM: 140 meq/L (ref 135–145)

## 2017-04-02 LAB — CBC
HEMATOCRIT: 42.5 % (ref 39.0–52.0)
Hemoglobin: 14.3 g/dL (ref 13.0–17.0)
MCHC: 33.7 g/dL (ref 30.0–36.0)
MCV: 96.1 fl (ref 78.0–100.0)
PLATELETS: 281 10*3/uL (ref 150.0–400.0)
RBC: 4.42 Mil/uL (ref 4.22–5.81)
RDW: 13.1 % (ref 11.5–15.5)
WBC: 7 10*3/uL (ref 4.0–10.5)

## 2017-04-02 LAB — HEMOGLOBIN A1C: Hgb A1c MFr Bld: 6.2 % (ref 4.6–6.5)

## 2017-04-02 NOTE — Patient Instructions (Addendum)
BEFORE YOU LEAVE: -follow up: Medicare Exam with Darl PikesSusan and CPE with Dr. Selena BattenKim - same day in November. -labs  We have ordered labs or studies at this visit. It can take up to 1-2 weeks for results and processing. IF results require follow up or explanation, we will call you with instructions. Clinically stable results will be released to your [facility]. If you have not heard from us or cannot find your results in [facility] in 2 weeks please contact our office at [phone].  If you are not yet signed up for [facility], please consider signing up.   We recommend the following healthy lifestyle for LIFE: 1) Small portions.   Tip: eat off of a salad plate instead of a dinner plate.  Tip: It is ok to feel hungry after a meal - that likely means you ate an appropriate portion.  Tip: if you need more or a snack choose fruits, veggies and/or a handful of nuts or seeds.  2) Eat a healthy clean diet.   TRY TO EAT: -at least 5-7 servings of low sugar vegetables per day (not corn, potatoes or bananas.) -berries are the best choice if you wish to eat fruit.   -lean meets (fish, chicken or [place] breasts) -vegan proteins for some meals - beans or tofu, whole grains, nuts and seeds -Replace bad fats with good fats - good fats include: fish, nuts and seeds, canola oil, olive oil -small amounts of low fat or non fat dairy -small amounts of100 % whole grains - check the lables  AVOID: -SUGAR, sweets, anything with added sugar, corn syrup or sweeteners -if you must have a sweetener, small amounts of stevia may be best -sweetened beverages -simple starches (rice, bread, potatoes, pasta, chips, etc - small amounts of 100% whole grains are ok) -red meat, pork, butter -fried foods, fast food, processed food, excessive dairy, eggs and coconut.  3)Get at least 150 minutes of sweaty aerobic exercise per week.  4)Reduce stress - consider counseling, meditation and relaxation to balance other aspects of your  life.

## 2017-05-14 ENCOUNTER — Other Ambulatory Visit: Payer: Self-pay | Admitting: Family Medicine

## 2017-06-03 ENCOUNTER — Encounter: Payer: Self-pay | Admitting: Family Medicine

## 2017-07-20 ENCOUNTER — Ambulatory Visit (HOSPITAL_COMMUNITY)
Admission: EM | Admit: 2017-07-20 | Discharge: 2017-07-20 | Disposition: A | Discharge status: Home / Self Care | Payer: Medicare HMO | Attending: Emergency Medicine | Admitting: Emergency Medicine

## 2017-07-20 ENCOUNTER — Encounter (HOSPITAL_COMMUNITY): Payer: Self-pay | Admitting: Emergency Medicine

## 2017-07-20 DIAGNOSIS — W010XXA Fall on same level from slipping, tripping and stumbling without subsequent striking against object, initial encounter: Secondary | ICD-10-CM | POA: Diagnosis not present

## 2017-07-20 DIAGNOSIS — S86012A Strain of left Achilles tendon, initial encounter: Secondary | ICD-10-CM | POA: Diagnosis not present

## 2017-07-20 NOTE — ED Provider Notes (Signed)
[facility]    CSN: [id number] Arrival date & time: 07/20/17  1107     History   Chief Complaint Chief Complaint  Patient presents with  . Foot Injury    HPI Gary Clark is a 46 y.o. male.   46 year old man states he slipped on the step this morning and landed hard on his left heel. He is complaining of pain to the left posterior heel. The pain is much worse with ambulation. Denies injury to the foot or other areas.      Past Medical History:  Diagnosis Date  . Allergy   . Anxiety   . Arthritis   . Asthma   . Back pain   . Blood in stool   . Cluster headache   . Depression   . Headache(784.0)    frequent   . Hyperlipidemia   . Hypertension   . Lumbosacral spondylosis without myelopathy 06/15/2013  . Migraines   . Postlaminectomy syndrome, lumbar region 03/08/2015   Status post L4-L5 decompression and fusion, anterior/posterior in 2011   . Pre-diabetes    pre diabetic per his PCP  . Sleep apnea    tested in 2014 positive   . Stroke [facility](HCC)    pt states "mini-Stroke"  . UTI (urinary tract infection)     Patient Active Problem List   Diagnosis Date Noted  . Back pain 10/01/2016  . Lumbar radiculitis 07/17/2016  . Lumbosacral radiculitis 05/19/2016  . Patellofemoral syndrome of both knees 05/19/2016  . Sacroiliac joint dysfunction of both sides 02/04/2016  . Obesity 06/03/2015  . Mild intermittent asthma 06/03/2015  . Major depressive disorder, recurrent, in remission (HCC) 06/03/2015  . Postlaminectomy syndrome, lumbar region 03/08/2015  . Migraine/Cluster HAs 10/14/2012  . Chronic low back pain - hx spinal stenosis, decompression surgery in 2010, 2011 10/14/2012  . Hyperlipemia 10/14/2012  . Hypertension 10/14/2012    Past Surgical History:  Procedure Laterality Date  . ANTERIOR AND POSTERIOR SPINAL FUSION  2011  . BACK SURGERY    . LUMBAR LAMINECTOMY/DECOMPRESSION MICRODISCECTOMY  2010       Home Medications    Prior to  Admission medications   Medication Sig Start Date End Date Taking? Authorizing Provider  citalopram (CELEXA) 40 MG tablet Take 1 tablet (40 mg total) by mouth daily. 03/23/17  Yes Kriste BasqueKim, Hannah R, DO  hydrochlorothiazide (HYDRODIURIL) 25 MG tablet Take 1 tablet (25 mg total) by mouth daily. 07/09/16  Yes Kriste BasqueKim, Hannah R, DO  hydrochlorothiazide (HYDRODIURIL) 25 MG tablet TAKE 1 TABLET BY MOUTH ONCE DAILY (NEED  AN  APPOINTMENT) 05/14/17  Yes Kriste BasqueKim, Hannah R, DO  lisinopril (PRINIVIL,ZESTRIL) 20 MG tablet Take 1.5 tablets (30 mg total) by mouth daily. Patient taking differently: Take 20 mg by mouth daily.  05/22/16  Yes Terressa KoyanagiKim, Hannah R, DO  metFORMIN (GLUCOPHAGE) 500 MG tablet Take 1 tablet (500 mg total) by mouth 2 (two) times daily with a meal. Patient taking differently: Take 500 mg by mouth daily with breakfast.  12/20/15  Yes Terressa KoyanagiKim, Hannah R, DO  methocarbamol (ROBAXIN) 500 MG tablet Take 1 tablet (500 mg total) by mouth 3 (three) times daily as needed for muscle spasms. 10/01/16  Yes Venita LickBrooks, Dahari, MD  ondansetron (ZOFRAN) 4 MG tablet Take 1 tablet (4 mg total) by mouth every 8 (eight) hours as needed for nausea or vomiting. 10/01/16  Yes Venita LickBrooks, Dahari, MD  lisinopril (PRINIVIL,ZESTRIL) 20 MG tablet TAKE ONE TABLET BY MOUTH DAILY 12/24/16   Terressa KoyanagiKim, Hannah R,  DO  methocarbamol (ROBAXIN) 500 MG tablet Take 1 tablet (500 mg total) by mouth every 6 (six) hours as needed for muscle spasms. 10/03/16   Dixon, Katharina Caper, PA-C  oxyCODONE-acetaminophen (PERCOCET) 10-325 MG tablet Take 1 tablet by mouth every 4 (four) hours as needed for pain. 10/01/16   Venita Lick, MD  rosuvastatin (CRESTOR) 40 MG tablet Take 1 tablet (40 mg total) by mouth daily. 01/03/16   Terressa Koyanagi, DO  sildenafil (REVATIO) 20 MG tablet Take 2-5 tablets (40-100mg ) as needed prior to sexual activity. Use lowest effective dose. Do not take more then one dose in 24 hours. 06/22/16   Terressa Koyanagi, DO    Family History Family History  Problem  Relation Age of Onset  . Heart disease Mother   . Hypertension Mother   . Arthritis Father   . Hypertension Father   . Prostate cancer Father   . Miscarriages / Stillbirths Maternal Grandmother   . Heart disease Maternal Grandfather   . Sudden death Maternal Grandfather   . Breast cancer Sister   . Migraines Sister     Social History Social History   Tobacco Use  . Smoking status: Never Smoker  . Smokeless tobacco: Never Used  Substance Use Topics  . Alcohol use: Yes    Comment: once a month  . Drug use: No     Allergies   Codeine   Review of Systems Review of Systems  Constitutional: Negative.   Respiratory: Negative.   Gastrointestinal: Negative.   Genitourinary: Negative.   Musculoskeletal:       As per HPI  Skin: Negative.   Neurological: Negative for dizziness, weakness, numbness and headaches.  All other systems reviewed and are negative.    Physical Exam Triage Vital Signs ED Triage Vitals  Enc Vitals Group     BP 07/20/17 1132 (!) 158/105     Pulse Rate 07/20/17 1132 (!) 105     Resp 07/20/17 1132 16     Temp 07/20/17 1132 99 F (37.2 C)     Temp Source 07/20/17 1132 Oral     SpO2 07/20/17 1132 100 %     Weight 07/20/17 1133 220 lb (99.8 kg)     Height 07/20/17 1133 5\' 6"  (1.676 m)     Head Circumference --      Peak Flow --      Pain Score 07/20/17 1133 5     Pain Loc --      Pain Edu? --      Excl. in GC? --    No data found.  Updated Vital Signs BP (!) 158/105   Pulse (!) 105   Temp 99 F (37.2 C) (Oral)   Resp 16   Ht 5\' 6"  (1.676 m)   Wt 220 lb (99.8 kg)   SpO2 100%   BMI 35.51 kg/m   Visual Acuity Right Eye Distance:   Left Eye Distance:   Bilateral Distance:    Right Eye Near:   Left Eye Near:    Bilateral Near:     Physical Exam  Constitutional: He is oriented to person, place, and time. He appears well-developed and well-nourished.  HENT:  Head: Normocephalic and atraumatic.  Eyes: EOM are normal. Left eye  exhibits no discharge.  Neck: Normal range of motion. Neck supple.  Musculoskeletal: Normal range of motion. He exhibits tenderness. He exhibits no deformity.  Inspection of ankle reveals no deformity, swelling or discoloration. No tenderness to the lateral or  medial aspect of the ankle. Tenderness is limited to the posterior Achilles tendon. There is no tenderness to the plantar aspect of the heel. Palpating the length of the Achilles tendon does not reveal any indentations, deformities or inconsistency in the length or continuity of the tendon. The pain is primarily at the insertion point of the posterior heel. Plantar flexion increases pain. However, plantarflexion and dorsiflexion is intact.  Neurological: He is alert and oriented to person, place, and time. No cranial nerve deficit.  Skin: Skin is warm and dry.  Psychiatric: He has a normal mood and affect.  Nursing note and vitals reviewed.    UC Treatments / Results  Labs (all labs ordered are listed, but only abnormal results are displayed) Labs Reviewed - No data to display  EKG  EKG Interpretation None       Radiology No results found.  Procedures Procedures (including critical care time)  Medications Ordered in UC Medications - No data to display   Initial Impression / Assessment and Plan / UC Course  I have reviewed the triage vital signs and the nursing notes.  Pertinent labs & imaging results that were available during my care of the patient were reviewed by me and considered in my medical decision making (see chart for details).    Rest, ice, elevation and wear the wrap most of the time. The next few days. Limit pushing off with the right foot when ambulating. Applied ice off and on for the next couple days. He may take ibuprofen or Aleve as needed for pain.   C[place]an wrap to the left angle and minimal plantar flexion per provider  Final Clinical Impressions(s) / UC Diagnoses   Final diagnoses:  Achilles  tendon sprain, left, initial encounter    ED Discharge Orders    None       Controlled Substance Prescriptions [place] Controlled Substance Registry consulted? Not Applicable   Hayden RasmussenMabe, Farris Geiman, NP 07/20/17 1318

## 2017-07-20 NOTE — Discharge Instructions (Signed)
Rest, ice, elevation and wear the wrap most of the time. The next few days. Limit pushing off with the right foot when ambulating. Applied ice off and on for the next couple days. He may take ibuprofen or Aleve as needed for pain.

## 2017-07-20 NOTE — ED Triage Notes (Signed)
PT slipped on wet leaves this morning, fell down one step and landed on left heel. PT is able to bear weight.

## 2017-07-28 NOTE — Progress Notes (Signed)
HPI:  Here for CPE: Due for labs and flu vaccine.  -Concerns and/or follow up today: none  Seeing Darl Pikes for AWV today.  PMH Prediabetes, Obesity, HTN, HLD, Depression, Sleep apnea and chronic back pain. Reports doing well overall.  Continues to take his blood pressure medications and cholesterol medicine.  Needs refills on these in on the Celexa.  Reports his mood has been pretty good overall, he has had a lot of stress with his mom sick with breast cancer, but she is doing better.  He feels the medication is working well and does not need further help at this time.  Denies any severe symptoms or suicidal ideation.  See PHQ 9 screening.    -Diet: variety of foods, balance and well rounded, larger portion sizes  -Exercise: no regular exercise  -Diabetes and Dyslipidemia Screening: Not fasting today  -Hx of HTN: no  -Vaccines: UTD  -sexual activity: yes, male partner, no new partners  -wants STI testing, Hep C screening (if born 78-1966): no  -FH colon or prstate ca: see FH Last colon cancer screening: n/a Last prostate ca screening:n/a  -Alcohol, Tobacco, drug use: see social history  Review of Systems - no fevers, unintentional weight loss, vision loss, hearing loss, chest pain, sob, hemoptysis, melena, hematochezia, hematuria, genital discharge, changing or concerning skin lesions, bleeding, bruising, loc, thoughts of self harm or SI  Past Medical History:  Diagnosis Date  . Allergy   . Anxiety   . Arthritis   . Asthma   . Back pain   . Blood in stool   . Cluster headache   . Depression   . Headache(784.0)    frequent   . Hyperlipidemia   . Hypertension   . Lumbosacral spondylosis without myelopathy 06/15/2013  . Migraines   . Postlaminectomy syndrome, lumbar region 03/08/2015   Status post L4-L5 decompression and fusion, anterior/posterior in 2011   . Pre-diabetes    pre diabetic per his PCP  . Sleep apnea    tested in 2014 positive   . Stroke [facility])    pt  states "mini-Stroke"  . UTI (urinary tract infection)     Past Surgical History:  Procedure Laterality Date  . ANTERIOR AND POSTERIOR SPINAL FUSION  2011  . ANTERIOR LAT LUMBAR FUSION N/A 10/01/2016   Procedure: XLIF L3-4 ANTERIOR LATERAL LUMBAR FUSION 1 LEVEL;  Surgeon: Venita Lick, MD;  Location: [facility] OR;  Service: Orthopedics;  Laterality: N/A;  . BACK SURGERY    . HARDWARE REMOVAL N/A 10/01/2016   Procedure: Removal of pedical screws L4-5 HARDWARE REMOVAL;  Surgeon: Venita Lick, MD;  Location: [facility] OR;  Service: Orthopedics;  Laterality: N/A;  . LUMBAR LAMINECTOMY/DECOMPRESSION MICRODISCECTOMY  2010    Family History  Problem Relation Age of Onset  . Heart disease Mother   . Hypertension Mother   . Arthritis Father   . Hypertension Father   . Prostate cancer Father   . Miscarriages / Stillbirths Maternal Grandmother   . Heart disease Maternal Grandfather   . Sudden death Maternal Grandfather   . Breast cancer Sister   . Migraines Sister     Social History   Socioeconomic History  . Marital status: Married    Spouse name: [name]  . Number of children: 1  . Years of education: 12th  . Highest education level: None  Social Needs  . Financial resource strain: None  . Food insecurity - worry: None  . Food insecurity - inability: None  . Transportation needs -  medical: None  . Transportation needs - non-medical: None  Occupational History    Comment: n/a  Tobacco Use  . Smoking status: Never Smoker  . Smokeless tobacco: Never Used  Substance and Sexual Activity  . Alcohol use: Yes    Comment: once a month  . Drug use: No  . Sexual activity: Yes    Birth control/protection: None  Other Topics Concern  . None  Social History Narrative   Patient lives at home with his family.   Caffeine Use: 2 cups daily     Current Outpatient Medications:  .  citalopram (CELEXA) 40 MG tablet, Take 1 tablet (40 mg total) daily by mouth., Disp: 30 tablet, Rfl: 11 .   hydrochlorothiazide (HYDRODIURIL) 25 MG tablet, TAKE 1 TABLET BY MOUTH ONCE DAILY (NEED  AN  APPOINTMENT), Disp: 90 tablet, Rfl: 1 .  hydrochlorothiazide (HYDRODIURIL) 25 MG tablet, Take 1 tablet (25 mg total) daily by mouth., Disp: 90 tablet, Rfl: 3 .  lisinopril (PRINIVIL,ZESTRIL) 20 MG tablet, Take 1.5 tablets (30 mg total) by mouth daily. (Patient taking differently: Take 20 mg by mouth daily. ), Disp: 135 tablet, Rfl: 3 .  lisinopril (PRINIVIL,ZESTRIL) 20 MG tablet, Take 1 tablet (20 mg total) daily by mouth., Disp: 90 tablet, Rfl: 3 .  metFORMIN (GLUCOPHAGE) 500 MG tablet, Take 1 tablet (500 mg total) by mouth 2 (two) times daily with a meal. (Patient taking differently: Take 500 mg by mouth daily with breakfast. ), Disp: 180 tablet, Rfl: 3 .  methocarbamol (ROBAXIN) 500 MG tablet, Take 1 tablet (500 mg total) by mouth 3 (three) times daily as needed for muscle spasms., Disp: 21 tablet, Rfl: 0 .  methocarbamol (ROBAXIN) 500 MG tablet, Take 1 tablet (500 mg total) by mouth every 6 (six) hours as needed for muscle spasms., Disp: 60 tablet, Rfl: 0 .  ondansetron (ZOFRAN) 4 MG tablet, Take 1 tablet (4 mg total) by mouth every 8 (eight) hours as needed for nausea or vomiting., Disp: 20 tablet, Rfl: 0 .  oxyCODONE-acetaminophen (PERCOCET) 10-325 MG tablet, Take 1 tablet by mouth every 4 (four) hours as needed for pain., Disp: 42 tablet, Rfl: 0 .  rosuvastatin (CRESTOR) 40 MG tablet, Take 1 tablet (40 mg total) by mouth daily., Disp: 90 tablet, Rfl: 3 .  sildenafil (REVATIO) 20 MG tablet, Take 2-5 tablets (40-100mg ) as needed prior to sexual activity. Use lowest effective dose. Do not take more then one dose in 24 hours., Disp: 50 tablet, Rfl: 0  EXAM:  Vitals:   07/29/17 0915  BP: 124/82  Pulse: 94  Temp: 98.5 F (36.9 C)  TempSrc: Oral  Weight: 223 lb 3.2 oz (101.2 kg)  Height: 5' 5.75" (1.67 m)    Estimated body mass index is 36.3 kg/m as calculated from the following:   Height as of  this encounter: 5' 5.75" (1.67 m).   Weight as of this encounter: 223 lb 3.2 oz (101.2 kg).  GENERAL: vitals reviewed and listed below, alert, oriented, appears well hydrated and in no acute distress  HEENT: head atraumatic, PERRLA, normal appearance of eyes, ears, nose and mouth. moist mucus membranes.  NECK: supple, no masses or lymphadenopathy  LUNGS: clear to auscultation bilaterally, no rales, rhonchi or wheeze  CV: HRRR, no peripheral edema or cyanosis, normal pedal pulses  ABDOMEN: bowel sounds normal, soft, non tender to palpation, no masses, no rebound or guarding  GU: He declined a GU urinary exam and a digital rectal exam  SKIN: no rash  or abnormal lesions, hedeclined a full skin check  MS: normal gait, moves all extremities normally  NEURO: normal gait, speech and thought processing grossly intact, muscle tone grossly intact throughout  PSYCH: normal affect, pleasant and cooperative  ASSESSMENT AND PLAN:  Discussed the following assessment and plan:  Encounter for preventive health examination -Discussed and advised all [place] preventive services health task force level A and B recommendations for age, sex and risks.  --Advised at least 150 minutes of exercise per week and a healthy diet with avoidance of (less then 1 serving per week) processed foods, white starches, red meat, fast foods and sweets and consisting of: * 5-9 servings of fresh fruits and vegetables (not corn or potatoes) *nuts and seeds, beans *olives and olive oil *lean meats such as fish and white chicken  *whole grains  -labs, studies and vaccines per orders this encounter  He will return for fasting lab work  Morbid obesity (HCC) -Lifestyle recommendations -patient instructions, weight reduction advised -Follow-up 3-4 months  Essential hypertension -Refills, labs, continue medications  Hyperlipidemia, unspecified hyperlipidemia type -Refills, labs, continue medications  MDD: -stable,  continue medication  Medicare annual wellness visit, subsequent      Patient advised to return to clinic immediately if symptoms worsen or persist or new concerns.  Patient Instructions  BEFORE YOU LEAVE: -Refills for 1 year requested medications -Annual wellness visit with Darl PikesSusan -Flu shot -follow up: 1.  Fasting lab visit in the next 2 weeks 2.  Follow-up in 3-4 months   Health Maintenance, Male A healthy lifestyle and preventive care is important for your health and wellness. Ask your health care provider about what schedule of regular examinations is right for you. What should I know about weight and diet? Eat a Healthy Diet  Eat plenty of vegetables, fruits, whole grains, low-fat dairy products, and lean protein.  Do not eat a lot of foods high in solid fats, added sugars, or salt.  Maintain a Healthy Weight Regular exercise can help you achieve or maintain a healthy weight. You should:  Do at least 150 minutes of exercise each week. The exercise should increase your heart rate and make you sweat (moderate-intensity exercise).  Do strength-training exercises at least twice a week.  Watch Your Levels of Cholesterol and Blood Lipids  Have your blood tested for lipids and cholesterol every 5 years starting at 47 years of age. If you are at high risk for heart disease, you should start having your blood tested when you are 47 years old. You may need to have your cholesterol levels checked more often if: ? Your lipid or cholesterol levels are high. ? You are older than 47 years of age. ? You are at high risk for heart disease.  What should I know about cancer screening? Many types of cancers can be detected early and may often be prevented. Lung Cancer  You should be screened every year for lung cancer if: ? You are a current smoker who has smoked for at least 30 years. ? You are a former smoker who has quit within the past 15 years.  Talk to your health care  provider about your screening options, when you should start screening, and how often you should be screened.  Colorectal Cancer  Routine colorectal cancer screening usually begins at 47 years of age and should be repeated every 5-10 years until you are 47 years old. You may need to be screened more often if early forms of precancerous polyps or  small growths are found. Your health care provider may recommend screening at an earlier age if you have risk factors for colon cancer.  Your health care provider may recommend using home test kits to check for hidden blood in the stool.  A small camera at the end of a tube can be used to examine your colon (sigmoidoscopy or colonoscopy). This checks for the earliest forms of colorectal cancer.  Prostate and Testicular Cancer  Depending on your age and overall health, your health care provider may do certain tests to screen for prostate and testicular cancer.  Talk to your health care provider about any symptoms or concerns you have about testicular or prostate cancer.  Skin Cancer  Check your skin from head to toe regularly.  Tell your health care provider about any new moles or changes in moles, especially if: ? There is a change in a mole's size, shape, or color. ? You have a mole that is larger than a pencil eraser.  Always use sunscreen. Apply sunscreen liberally and repeat throughout the day.  Protect yourself by wearing long sleeves, pants, a wide-brimmed hat, and sunglasses when outside.  What should I know about heart disease, diabetes, and high blood pressure?  If you are 5218-47 years of age, have your blood pressure checked every 3-5 years. If you are 47 years of age or older, have your blood pressure checked every year. You should have your blood pressure measured twice-once when you are at a hospital or clinic, and once when you are not at a hospital or clinic. Record the average of the two measurements. To check your blood pressure  when you are not at a hospital or clinic, you can use: ? An automated blood pressure machine at a pharmacy. ? A home blood pressure monitor.  Talk to your health care provider about your target blood pressure.  If you are between 5945-47 years old, ask your health care provider if you should take aspirin to prevent heart disease.  Have regular diabetes screenings by checking your fasting blood sugar level. ? If you are at a normal weight and have a low risk for diabetes, have this test once every three years after the age of 47. ? If you are overweight and have a high risk for diabetes, consider being tested at a younger age or more often.  A one-time screening for abdominal aortic aneurysm (AAA) by ultrasound is recommended for men aged 65-75 years who are current or former smokers. What should I know about preventing infection? Hepatitis B If you have a higher risk for hepatitis B, you should be screened for this virus. Talk with your health care provider to find out if you are at risk for hepatitis B infection. Hepatitis C Blood testing is recommended for:  Everyone born from [id number] through 1965.  Anyone with known risk factors for hepatitis C.  Sexually Transmitted Diseases (STDs)  You should be screened each year for STDs including gonorrhea and chlamydia if: ? You are sexually active and are younger than 47 years of age. ? You are older than 47 years of age and your health care provider tells you that you are at risk for this type of infection. ? Your sexual activity has changed since you were last screened and you are at an increased risk for chlamydia or gonorrhea. Ask your health care provider if you are at risk.  Talk with your health care provider about whether you are at high risk of being  infected with HIV. Your health care provider may recommend a prescription medicine to help prevent HIV infection.  What else can I do?  Schedule regular health, dental, and eye  exams.  Stay current with your vaccines (immunizations).  Do not use any tobacco products, such as cigarettes, chewing tobacco, and e-cigarettes. If you need help quitting, ask your health care provider.  Limit alcohol intake to no more than 2 drinks per day. One drink equals 12 ounces of beer, 5 ounces of wine, or 1 ounces of hard liquor.  Do not use street drugs.  Do not share needles.  Ask your health care provider for help if you need support or information about quitting drugs.  Tell your health care provider if you often feel depressed.  Tell your health care provider if you have ever been abused or do not feel safe at home. This information is not intended to replace advice given to you by your health care provider. Make sure you discuss any questions you have with your health care provider. Document Released: 02/27/2008 Document Revised: 04/29/2016 Document Reviewed: 06/04/2015 Elsevier Interactive Patient Education  2018 [employer].    No Follow-up on file.   Kriste Basque R., DO

## 2017-07-28 NOTE — Progress Notes (Signed)
Subjective:   Gary Clark is a 46 y.o. male who presents for Medicare Annual/Subsequent preventive examination.  The Patient was informed that the wellness visit is to identify future health risk and educate and initiate measures that can reduce risk for increased disease through the lifespan.    Annual Wellness Assessment  Reports health as about the same as last year Wife works with children with DD Son 18 lives with them; disabled  Disability due to work related injury per his report  Still have some pain; back surgery in January 2018  Preventive Screening -Counseling & Management  Medicare Annual Preventive Care Visit - Subsequent Last OV 03/2017   There are no preventive care reminders to display for this patient. 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 [facility]   Patient Care Team: Terressa KoyanagiKim, Hannah R, DO as PCP - General (Family Medicine) Cardiac Risk Factors include: advanced age (>2355men, 30>65 women);diabetes mellitus;hypertension;male gender;obesity (BMI >30kg/m2)     Objective:    Vitals: BP 124/82 (BP Location: Left Arm, Patient Position: Sitting, Cuff Size: Large)   Pulse 94   Temp 98.5 F (36.9 C) (Oral)   Ht 5'  5.75" (1.67 m)   Wt 223 lb 3.2 oz (101.2 kg)   BMI 36.30 kg/m   Body mass index is 36.3 kg/m.  Tobacco Social History   Tobacco Use  Smoking Status Never Smoker  Smokeless Tobacco Never Used     Counseling given: Yes   Past Medical History:  Diagnosis Date  . Allergy   . Anxiety   . Arthritis   . Asthma   . Back pain   . Blood in stool   . Cluster headache   . Depression   . Headache(784.0)    frequent   . Hyperlipidemia   . Hypertension   . Lumbosacral spondylosis without myelopathy 06/15/2013  . Migraines   . Postlaminectomy syndrome, lumbar region 03/08/2015   Status post L4-L5 decompression and fusion, anterior/posterior in 2011   . Pre-diabetes    pre diabetic per his PCP  . Sleep apnea    tested in 2014 positive   . Stroke [facility](HCC)    pt states "mini-Stroke"  . UTI (urinary tract infection)    Past Surgical History:  Procedure Laterality Date  . ANTERIOR AND POSTERIOR SPINAL FUSION  2011  . ANTERIOR LAT LUMBAR FUSION N/A 10/01/2016   Procedure: XLIF L3-4 ANTERIOR LATERAL LUMBAR FUSION 1 LEVEL;  Surgeon: Venita Lickahari Brooks, MD;  Location: [facility] OR;  Service: Orthopedics;  Laterality: N/A;  . BACK SURGERY    .  HARDWARE REMOVAL N/A 10/01/2016   Procedure: Removal of pedical screws L4-5 HARDWARE REMOVAL;  Surgeon: Venita Lickahari Brooks, MD;  Location: [facility] OR;  Service: Orthopedics;  Laterality: N/A;  . LUMBAR LAMINECTOMY/DECOMPRESSION MICRODISCECTOMY  2010   Family History  Problem Relation Age of Onset  . Heart disease Mother   . Hypertension Mother   . Arthritis Father   . Hypertension Father   . Prostate cancer Father   . Miscarriages / Stillbirths Maternal Grandmother   . Heart disease Maternal Grandfather   . Sudden death Maternal Grandfather   . Breast cancer Sister   . Migraines Sister    Social History   Substance and Sexual Activity  Sexual Activity Yes  . Birth control/protection: None    Outpatient Encounter Medications as of 07/29/2017  Medication Sig    . citalopram (CELEXA) 40 MG tablet Take 1 tablet (40 mg total) daily by mouth.  . hydrochlorothiazide (HYDRODIURIL) 25 MG tablet TAKE 1 TABLET BY MOUTH ONCE DAILY (NEED  AN  APPOINTMENT)  . hydrochlorothiazide (HYDRODIURIL) 25 MG tablet Take 1 tablet (25 mg total) daily by mouth.  Marland Kitchen. lisinopril (PRINIVIL,ZESTRIL) 20 MG tablet Take 1.5 tablets (30 mg total) by mouth daily. (Patient taking differently: Take 20 mg by mouth daily. )  . lisinopril (PRINIVIL,ZESTRIL) 20 MG tablet Take 1 tablet (20 mg total) daily by mouth.  . metFORMIN (GLUCOPHAGE) 500 MG tablet Take 1 tablet (500 mg total) by mouth 2 (two) times daily with a meal. (Patient taking differently: Take 500 mg by mouth daily with breakfast. )  . methocarbamol (ROBAXIN) 500 MG tablet Take 1 tablet (500 mg total) by mouth 3 (three) times daily as needed for muscle spasms.  . methocarbamol (ROBAXIN) 500 MG tablet Take 1 tablet (500 mg total) by mouth every 6 (six) hours as needed for muscle spasms.  . ondansetron (ZOFRAN) 4 MG tablet Take 1 tablet (4 mg total) by mouth every 8 (eight) hours as needed for nausea or vomiting.  Marland Kitchen. oxyCODONE-acetaminophen (PERCOCET) 10-325 MG tablet Take 1 tablet by mouth every 4 (four) hours as needed for pain.  . rosuvastatin (CRESTOR) 40 MG tablet Take 1 tablet (40 mg total) by mouth daily.  . sildenafil (REVATIO) 20 MG tablet Take 2-5 tablets (40-100mg ) as needed prior to sexual activity. Use lowest effective dose. Do not take more then one dose in 24 hours.  . [DISCONTINUED] citalopram (CELEXA) 40 MG tablet Take 1 tablet (40 mg total) by mouth daily.  . [DISCONTINUED] hydrochlorothiazide (HYDRODIURIL) 25 MG tablet Take 1 tablet (25 mg total) by mouth daily.  . [DISCONTINUED] lisinopril (PRINIVIL,ZESTRIL) 20 MG tablet TAKE ONE TABLET BY MOUTH DAILY   No facility-administered encounter medications on file as of 07/29/2017.     Activities of Daily Living In your present state of health, do you have any  difficulty performing the following activities: 07/29/2017 10/02/2016  Hearing? N N  Vision? N N  Difficulty concentrating or making decisions? N N  Walking or climbing stairs? N Y  Dressing or bathing? N N  Doing errands, shopping? N N  Preparing Food and eating ? N -  Using the Toilet? N -  In the past six months, have you accidently leaked urine? N -  Do you have problems with loss of bowel control? N -  Managing your Medications? N -  Managing your Finances? N -  Housekeeping or managing your Housekeeping? N -  Some recent data might be hidden    Patient Care Team: Kriste BasqueKim, Hannah  R, DO as PCP - General (Family Medicine)   Assessment:     Exercise Activities and Dietary recommendations Current Exercise Habits: Home exercise routine, Type of exercise: walking, Time (Minutes): 60, Frequency (Times/Week): 2, Weekly Exercise (Minutes/Week): 120  Goals    . Weight (lb) < 195 lb (88.5 kg)     Try to eat 3 meals; a day  Balance out calories  Educated regarding prediabetes and numbers;  A1c ranges from 5.8 to 6.5 or fasting Blood sugar > 115 -126; (126 is diabetic)   Risk: >45yo; family hx; overweight or obese; African American; Hispanic; Latino; American [place]; [place] American; [place] Islander; history of diabetes when pregnant; or birth to a baby weighing over 9 lbs. Being less physically active than 30 minutes; 3 times a week;   Prevention; Losing a modest 7 to 8 lbs; If over 200 lbs; 10 to 14 lbs;  Choose healthier foods; colorful veggies; fish or lean meats; drinks water Reduce portion size Start exercising; 30 minutes of fast walking x 30 minutes per day/ 60 min for weight loss   [email or web address]            Fall Risk Fall Risk  07/29/2017 07/09/2016 06/25/2016 06/01/2016 05/19/2016  Falls in the past year? No No No No No  Comment caught himself and sprain his achilles  - - - -  Risk for fall due to : - - - - -   Depression Screen PHQ 2/9 Scores 07/29/2017 07/09/2016  05/19/2016 06/10/2015  PHQ - 2 Score 0 0 0 3  PHQ- 9 Score - - - 12   Mother had breast cancer and being tx at [facility] Children are making their payments   Cognitive Function MMSE - Mini Mental State Exam 07/29/2017 07/09/2016  Not completed: (No Data) (No Data)        Immunization History  Administered Date(s) Administered  . Influenza Split 06/10/2012  . Influenza,inj,Quad PF,6+ Mos 06/12/2013, 06/03/2015, 06/18/2016, 07/29/2017  . Tdap 01/01/2015   Screening Tests Health Maintenance  Topic Date Due  . HIV Screening  12/31/2024 (Originally 02/07/1986)  . TETANUS/TDAP  12/31/2024  . INFLUENZA VACCINE  Completed      Plan:     PCP Notes   Health Maintenance Flu vaccine today  Abnormal Screens  Discussed A1c and to be rechecked soon   Referrals  None   Patient concerns; None; doing well; plans on graduating from [employer] in Dec and then going to get hs MSW. Hopes to start a home for children with DD   Nurse Concerns; As noted   Next PCP apt Seen today       I have personally reviewed and noted the following in the patient's chart:   . Medical and social history . Use of alcohol, tobacco or illicit drugs  . Current medications and supplements . Functional ability and status . Nutritional status . Physical activity . Advanced directives . List of other physicians . Hospitalizations, surgeries, and ER visits in previous 12 months . Vitals . Screenings to include cognitive, depression, and falls . Referrals and appointments  In addition, I have reviewed and discussed with patient certain preventive protocols, quality metrics, and best practice recommendations. A written personalized care plan for preventive services as well as general preventive health recommendations were provided to patient.     Montine Circle, RN  07/29/2017  Montine Circle, RN

## 2017-07-29 ENCOUNTER — Encounter: Payer: Self-pay | Admitting: Family Medicine

## 2017-07-29 ENCOUNTER — Ambulatory Visit (INDEPENDENT_AMBULATORY_CARE_PROVIDER_SITE_OTHER): Payer: Medicare HMO | Admitting: Family Medicine

## 2017-07-29 VITALS — BP 124/82 | HR 94 | Temp 98.5°F | Ht 65.75 in | Wt 223.2 lb

## 2017-07-29 DIAGNOSIS — I10 Essential (primary) hypertension: Secondary | ICD-10-CM | POA: Diagnosis not present

## 2017-07-29 DIAGNOSIS — F3340 Major depressive disorder, recurrent, in remission, unspecified: Secondary | ICD-10-CM

## 2017-07-29 DIAGNOSIS — E785 Hyperlipidemia, unspecified: Secondary | ICD-10-CM | POA: Diagnosis not present

## 2017-07-29 DIAGNOSIS — Z23 Encounter for immunization: Secondary | ICD-10-CM | POA: Diagnosis not present

## 2017-07-29 DIAGNOSIS — Z0000 Encounter for general adult medical examination without abnormal findings: Secondary | ICD-10-CM

## 2017-07-29 MED ORDER — LISINOPRIL 20 MG PO TABS: 20.0000 mg | ORAL_TABLET | Freq: Every day | ORAL | 3 refills | Status: DC

## 2017-07-29 MED ORDER — HYDROCHLOROTHIAZIDE 25 MG PO TABS: 25.0000 mg | ORAL_TABLET | Freq: Every day | ORAL | 3 refills | Status: DC

## 2017-07-29 MED ORDER — CITALOPRAM HYDROBROMIDE 40 MG PO TABS: 40.0000 mg | ORAL_TABLET | Freq: Every day | ORAL | 11 refills | Status: DC

## 2017-07-29 NOTE — Patient Instructions (Addendum)
BEFORE YOU LEAVE: -Refills for 1 year requested medications -Annual wellness visit with Gary Clark -Flu shot -follow up: 1.  Fasting lab visit in the next 2 weeks 2.  Follow-up in 3-4 months   Health Maintenance, Male A healthy lifestyle and preventive care is important for your health and wellness. Ask your health care provider about what schedule of regular examinations is right for you. What should I know about weight and diet? Eat a Healthy Diet  Eat plenty of vegetables, fruits, whole grains, low-fat dairy products, and lean protein.  Do not eat a lot of foods high in solid fats, added sugars, or salt.  Maintain a Healthy Weight Regular exercise can help you achieve or maintain a healthy weight. You should:  Do at least 150 minutes of exercise each week. The exercise should increase your heart rate and make you sweat (moderate-intensity exercise).  Do strength-training exercises at least twice a week.  Watch Your Levels of Cholesterol and Blood Lipids  Have your blood tested for lipids and cholesterol every 5 years starting at 47 years of age. If you are at high risk for heart disease, you should start having your blood tested when you are 47 years old. You may need to have your cholesterol levels checked more often if: ? Your lipid or cholesterol levels are high. ? You are older than 47 years of age. ? You are at high risk for heart disease.  What should I know about cancer screening? Many types of cancers can be detected early and may often be prevented. Lung Cancer  You should be screened every year for lung cancer if: ? You are a current smoker who has smoked for at least 30 years. ? You are a former smoker who has quit within the past 15 years.  Talk to your health care provider about your screening options, when you should start screening, and how often you should be screened.  Colorectal Cancer  Routine colorectal cancer screening usually begins at 47 years of age  and should be repeated every 5-10 years until you are 47 years old. You may need to be screened more often if early forms of precancerous polyps or small growths are found. Your health care provider may recommend screening at an earlier age if you have risk factors for colon cancer.  Your health care provider may recommend using home test kits to check for hidden blood in the stool.  A small camera at the end of a tube can be used to examine your colon (sigmoidoscopy or colonoscopy). This checks for the earliest forms of colorectal cancer.  Prostate and Testicular Cancer  Depending on your age and overall health, your health care provider may do certain tests to screen for prostate and testicular cancer.  Talk to your health care provider about any symptoms or concerns you have about testicular or prostate cancer.  Skin Cancer  Check your skin from head to toe regularly.  Tell your health care provider about any new moles or changes in moles, especially if: ? There is a change in a mole's size, shape, or color. ? You have a mole that is larger than a pencil eraser.  Always use sunscreen. Apply sunscreen liberally and repeat throughout the day.  Protect yourself by wearing long sleeves, pants, a wide-brimmed hat, and sunglasses when outside.  What should I know about heart disease, diabetes, and high blood pressure?  If you are 8618-47 years of age, have your blood pressure checked every 3-5  years. If you are 6 years of age or older, have your blood pressure checked every year. You should have your blood pressure measured twice-once when you are at a hospital or clinic, and once when you are not at a hospital or clinic. Record the average of the two measurements. To check your blood pressure when you are not at a hospital or clinic, you can use: ? An automated blood pressure machine at a pharmacy. ? A home blood pressure monitor.  Talk to your health care provider about your target blood  pressure.  If you are between 77-41 years old, ask your health care provider if you should take aspirin to prevent heart disease.  Have regular diabetes screenings by checking your fasting blood sugar level. ? If you are at a normal weight and have a low risk for diabetes, have this test once every three years after the age of 66. ? If you are overweight and have a high risk for diabetes, consider being tested at a younger age or more often.  A one-time screening for abdominal aortic aneurysm (AAA) by ultrasound is recommended for men aged 65-75 years who are current or former smokers. What should I know about preventing infection? Hepatitis B If you have a higher risk for hepatitis B, you should be screened for this virus. Talk with your health care provider to find out if you are at risk for hepatitis B infection. Hepatitis C Blood testing is recommended for:  Everyone born from [birth year] through 1965.  Anyone with known risk factors for hepatitis C.  Sexually Transmitted Diseases (STDs)  You should be screened each year for STDs including gonorrhea and chlamydia if: ? You are sexually active and are younger than 47 years of age. ? You are older than 47 years of age and your health care provider tells you that you are at risk for this type of infection. ? Your sexual activity has changed since you were last screened and you are at an increased risk for chlamydia or gonorrhea. Ask your health care provider if you are at risk.  Talk with your health care provider about whether you are at high risk of being infected with HIV. Your health care provider may recommend a prescription medicine to help prevent HIV infection.  What else can I do?  Schedule regular health, dental, and eye exams.  Stay current with your vaccines (immunizations).  Do not use any tobacco products, such as cigarettes, chewing tobacco, and e-cigarettes. If you need help quitting, ask your health care  provider.  Limit alcohol intake to no more than 2 drinks per day. One drink equals 12 ounces of beer, 5 ounces of wine, or 1 ounces of hard liquor.  Do not use street drugs.  Do not share needles.  Ask your health care provider for help if you need support or information about quitting drugs.  Tell your health care provider if you often feel depressed.  Tell your health care provider if you have ever been abused or do not feel safe at home. This information is not intended to replace advice given to you by your health care provider. Make sure you discuss any questions you have with your health care provider. Document Released: 02/27/2008 Document Revised: 04/29/2016 Document Reviewed: 06/04/2015 Elsevier Interactive Patient Education  2018 [employer].    Gary Clark , Thank you for taking time to come for your Medicare Wellness Visit. I appreciate your ongoing commitment to your health goals. Please review  the following plan we discussed and let me know if I can assist you in the future.   Will try to complete AD; Given copy  Referred to [facility] for questions [place] offers free advance directive forms, as well as assistance in completing the forms themselves. For assistance, contact the Spiritual Care Department at [phone], or the Clinical Social Work Department at [phone].  [place] [facility]; [phone] Sr. Glennon Hamilton; ([phone] Get resource to get information on any and all community programs for Seniors  Community solutions; "Aging Gracefully In Place" program; can request or apply  [place]: 361-476-1866 [facility] Response Program -857-130-7344 Public Health Dept; Need to be a skilled visit but can assist with bathing as well; (517) [phone]   Help with Rx at [facility]  Monday - Friday 8am to 10pm EST Sat- Sunday 9am to 7pm Patient help line 502-124-1900 Email support online at http://www.thompson.com/  Dept of Social  Services; Call 669-107-2655 and ask for SW on call  Options for Medicaid include the Community Alternatives program; -PCS.org (personal care services) or PACE program, which is a medical and social program combined   Caregiver support group and information regarding Long Term Care is at the; [facility] Address: [address] [place], [place] [address]  Phone: 6033641740   [employer] general resources for food etc   Http://nihseniorhealth.giv  Deaf & Hard of Hearing Division Services - can assist with hearing aid x 1  No reviews  [place] [facility]  [address] [place] [address]  [phone]     These are the goals we discussed: Goals    . Weight (lb) < 195 lb (88.5 kg)     Try to eat 3 meals; a day  Balance out calories  Educated regarding prediabetes and numbers;  A1c ranges from 5.8 to 6.5 or fasting Blood sugar > 115 -126; (126 is diabetic)   Risk: >45yo; family hx; overweight or obese; African American; Hispanic; Latino; American [place]; [place] American; [place] Islander; history of diabetes when pregnant; or birth to a baby weighing over 9 lbs. Being less physically active than 30 minutes; 3 times a week;   Prevention; Losing a modest 7 to 8 lbs; If over 200 lbs; 10 to 14 lbs;  Choose healthier foods; colorful veggies; fish or lean meats; drinks water Reduce portion size Start exercising; 30 minutes of fast walking x 30 minutes per day/ 60 min for weight loss   Diabetes.org             This is a list of the screening recommended for you and due dates:  Health Maintenance  Topic Date Due  . Flu Shot  04/14/2017  . HIV Screening  12/31/2024*  . Tetanus Vaccine  12/31/2024  *Topic was postponed. The date shown is not the original due date.     Health Maintenance, Male A healthy lifestyle and preventive care is important for your health and wellness. Ask your health care provider about what  schedule of regular examinations is right for you. What should I know about weight and diet? Eat a Healthy Diet  Eat plenty of vegetables, fruits, whole grains, low-fat dairy products, and lean protein.  Do not eat a lot of foods high in solid fats, added sugars, or salt.  Maintain a Healthy Weight Regular exercise can help you achieve or maintain a healthy weight. You should:  Do at least 150 minutes of exercise each week. The exercise should increase your heart rate and make you  sweat (moderate-intensity exercise).  Do strength-training exercises at least twice a week.  Watch Your Levels of Cholesterol and Blood Lipids  Have your blood tested for lipids and cholesterol every 5 years starting at 47 years of age. If you are at high risk for heart disease, you should start having your blood tested when you are 47 years old. You may need to have your cholesterol levels checked more often if: ? Your lipid or cholesterol levels are high. ? You are older than 47 years of age. ? You are at high risk for heart disease.  What should I know about cancer screening? Many types of cancers can be detected early and may often be prevented. Lung Cancer  You should be screened every year for lung cancer if: ? You are a current smoker who has smoked for at least 30 years. ? You are a former smoker who has quit within the past 15 years.  Talk to your health care provider about your screening options, when you should start screening, and how often you should be screened.  Colorectal Cancer  Routine colorectal cancer screening usually begins at 47 years of age and should be repeated every 5-10 years until you are 47 years old. You may need to be screened more often if early forms of precancerous polyps or small growths are found. Your health care provider may recommend screening at an earlier age if you have risk factors for colon cancer.  Your health care provider may recommend using home test kits  to check for hidden blood in the stool.  A small camera at the end of a tube can be used to examine your colon (sigmoidoscopy or colonoscopy). This checks for the earliest forms of colorectal cancer.  Prostate and Testicular Cancer  Depending on your age and overall health, your health care provider may do certain tests to screen for prostate and testicular cancer.  Talk to your health care provider about any symptoms or concerns you have about testicular or prostate cancer.  Skin Cancer  Check your skin from head to toe regularly.  Tell your health care provider about any new moles or changes in moles, especially if: ? There is a change in a mole's size, shape, or color. ? You have a mole that is larger than a pencil eraser.  Always use sunscreen. Apply sunscreen liberally and repeat throughout the day.  Protect yourself by wearing long sleeves, pants, a wide-brimmed hat, and sunglasses when outside.  What should I know about heart disease, diabetes, and high blood pressure?  If you are 22-90 years of age, have your blood pressure checked every 3-5 years. If you are 71 years of age or older, have your blood pressure checked every year. You should have your blood pressure measured twice-once when you are at a hospital or clinic, and once when you are not at a hospital or clinic. Record the average of the two measurements. To check your blood pressure when you are not at a hospital or clinic, you can use: ? An automated blood pressure machine at a pharmacy. ? A home blood pressure monitor.  Talk to your health care provider about your target blood pressure.  If you are between 40-58 years old, ask your health care provider if you should take aspirin to prevent heart disease.  Have regular diabetes screenings by checking your fasting blood sugar level. ? If you are at a normal weight and have a low risk for diabetes, have this test  once every three years after the age of 58. ? If you  are overweight and have a high risk for diabetes, consider being tested at a younger age or more often.  A one-time screening for abdominal aortic aneurysm (AAA) by ultrasound is recommended for men aged 65-75 years who are current or former smokers. What should I know about preventing infection? Hepatitis B If you have a higher risk for hepatitis B, you should be screened for this virus. Talk with your health care provider to find out if you are at risk for hepatitis B infection. Hepatitis C Blood testing is recommended for:  Everyone born from [birth year] through 1965.  Anyone with known risk factors for hepatitis C.  Sexually Transmitted Diseases (STDs)  You should be screened each year for STDs including gonorrhea and chlamydia if: ? You are sexually active and are younger than 47 years of age. ? You are older than 47 years of age and your health care provider tells you that you are at risk for this type of infection. ? Your sexual activity has changed since you were last screened and you are at an increased risk for chlamydia or gonorrhea. Ask your health care provider if you are at risk.  Talk with your health care provider about whether you are at high risk of being infected with HIV. Your health care provider may recommend a prescription medicine to help prevent HIV infection.  What else can I do?  Schedule regular health, dental, and eye exams.  Stay current with your vaccines (immunizations).  Do not use any tobacco products, such as cigarettes, chewing tobacco, and e-cigarettes. If you need help quitting, ask your health care provider.  Limit alcohol intake to no more than 2 drinks per day. One drink equals 12 ounces of beer, 5 ounces of wine, or 1 ounces of hard liquor.  Do not use street drugs.  Do not share needles.  Ask your health care provider for help if you need support or information about quitting drugs.  Tell your health care provider if you often feel  depressed.  Tell your health care provider if you have ever been abused or do not feel safe at home. This information is not intended to replace advice given to you by your health care provider. Make sure you discuss any questions you have with your health care provider. Document Released: 02/27/2008 Document Revised: 04/29/2016 Document Reviewed: 06/04/2015 Elsevier Interactive Patient Education  2018 [employer].    Fall Prevention in the Home Falls can cause injuries and can affect people from all age groups. There are many simple things that you can do to make your home safe and to help prevent falls. What can I do on the outside of my home?  Regularly repair the edges of walkways and driveways and fix any cracks.  Remove high doorway thresholds.  Trim any shrubbery on the main path into your home.  Use bright outdoor lighting.  Clear walkways of debris and clutter, including tools and rocks.  Regularly check that handrails are securely fastened and in good repair. Both sides of any steps should have handrails.  Install guardrails along the edges of any raised decks or porches.  Have leaves, snow, and ice cleared regularly.  Use sand or salt on walkways during winter months.  In the garage, clean up any spills right away, including grease or oil spills. What can I do in the bathroom?  Use night lights.  Install grab bars by the  toilet and in the tub and shower. Do not use towel bars as grab bars.  Use non-skid mats or decals on the floor of the tub or shower.  If you need to sit down while you are in the shower, use a plastic, non-slip stool.  Keep the floor dry. Immediately clean up any water that spills on the floor.  Remove soap buildup in the tub or shower on a regular basis.  Attach bath mats securely with double-sided non-slip rug tape.  Remove throw rugs and other tripping hazards from the floor. What can I do in the bedroom?  Use night lights.  Make  sure that a bedside light is easy to reach.  Do not use oversized bedding that drapes onto the floor.  Have a firm chair that has side arms to use for getting dressed.  Remove throw rugs and other tripping hazards from the floor. What can I do in the kitchen?  Clean up any spills right away.  Avoid walking on wet floors.  Place frequently used items in easy-to-reach places.  If you need to reach for something above you, use a sturdy step stool that has a grab bar.  Keep electrical cables out of the way.  Do not use floor polish or wax that makes floors slippery. If you have to use wax, make sure that it is non-skid floor wax.  Remove throw rugs and other tripping hazards from the floor. What can I do in the stairways?  Do not leave any items on the stairs.  Make sure that there are handrails on both sides of the stairs. Fix handrails that are broken or loose. Make sure that handrails are as long as the stairways.  Check any carpeting to make sure that it is firmly attached to the stairs. Fix any carpet that is loose or worn.  Avoid having throw rugs at the top or bottom of stairways, or secure the rugs with carpet tape to prevent them from moving.  Make sure that you have a light switch at the top of the stairs and the bottom of the stairs. If you do not have them, have them installed. What are some other fall prevention tips?  Wear closed-toe shoes that fit well and support your feet. Wear shoes that have rubber soles or low heels.  When you use a stepladder, make sure that it is completely opened and that the sides are firmly locked. Have someone hold the ladder while you are using it. Do not climb a closed stepladder.  Add color or contrast paint or tape to grab bars and handrails in your home. Place contrasting color strips on the first and last steps.  Use mobility aids as needed, such as canes, walkers, scooters, and crutches.  Turn on lights if it is dark. Replace  any light bulbs that burn out.  Set up furniture so that there are clear paths. Keep the furniture in the same spot.  Fix any uneven floor surfaces.  Choose a carpet design that does not hide the edge of steps of a stairway.  Be aware of any and all pets.  Review your medicines with your healthcare provider. Some medicines can cause dizziness or changes in blood pressure, which increase your risk of falling. Talk with your health care provider about other ways that you can decrease your risk of falls. This may include working with a physical therapist or trainer to improve your strength, balance, and endurance. This information is not intended to  replace advice given to you by your health care provider. Make sure you discuss any questions you have with your health care provider. Document Released: 08/21/2002 Document Revised: 01/28/2016 Document Reviewed: 10/05/2014 Elsevier Interactive Patient Education  2017 [employer].

## 2017-09-13 ENCOUNTER — Other Ambulatory Visit: Payer: Self-pay | Admitting: *Deleted

## 2017-09-13 MED ORDER — LISINOPRIL 20 MG PO TABS: 20.0000 mg | ORAL_TABLET | Freq: Every day | ORAL | 3 refills | Status: DC

## 2017-09-13 MED ORDER — CITALOPRAM HYDROBROMIDE 40 MG PO TABS: 40.0000 mg | ORAL_TABLET | Freq: Every day | ORAL | 3 refills | Status: DC

## 2017-09-13 MED ORDER — HYDROCHLOROTHIAZIDE 25 MG PO TABS: 25.0000 mg | ORAL_TABLET | Freq: Every day | ORAL | 3 refills | Status: DC

## 2017-09-13 NOTE — Telephone Encounter (Signed)
Rx done. 

## 2017-09-23 ENCOUNTER — Encounter: Payer: Self-pay | Admitting: Family Medicine

## 2017-10-27 DIAGNOSIS — Z981 Arthrodesis status: Secondary | ICD-10-CM | POA: Diagnosis not present

## 2017-10-27 DIAGNOSIS — M5136 Other intervertebral disc degeneration, lumbar region: Secondary | ICD-10-CM | POA: Insufficient documentation

## 2017-10-27 DIAGNOSIS — M5416 Radiculopathy, lumbar region: Secondary | ICD-10-CM | POA: Diagnosis not present

## 2017-10-27 DIAGNOSIS — M545 Low back pain: Secondary | ICD-10-CM | POA: Diagnosis not present

## 2017-11-19 ENCOUNTER — Encounter: Payer: Self-pay | Admitting: Family Medicine

## 2017-11-19 ENCOUNTER — Ambulatory Visit: Payer: Medicare HMO | Admitting: Family Medicine

## 2017-11-19 VITALS — BP 126/84 | HR 98 | Temp 98.4°F | Ht 65.75 in | Wt 220.4 lb

## 2017-11-19 DIAGNOSIS — J329 Chronic sinusitis, unspecified: Secondary | ICD-10-CM

## 2017-11-19 MED ORDER — IPRATROPIUM BROMIDE 0.06 % NA SOLN: 2.0000 | Freq: Four times a day (QID) | NASAL | 0 refills | Status: DC

## 2017-11-19 MED ORDER — AMOXICILLIN-POT CLAVULANATE 875-125 MG PO TABS: 1.0000 | ORAL_TABLET | Freq: Two times a day (BID) | ORAL | 0 refills | Status: DC

## 2017-11-19 NOTE — Progress Notes (Signed)
    Subjective:  Gary Clark is a 47 y.o. male who presents today for same-day appointment with a chief complaint of nasal congestion.   HPI:  Sinus congestion, acute issue Symptoms started about 10 days ago.  Worsened over the last few days.  Associated with rhinorrhea, sneeze, and irritated throat.  He has had several family members with similar symptoms.  He has tried taking Claritin and Flonase without significant help.  Is also had a little bit of nosebleed in his left nostril.  No fevers or chills.  No other treatments tried.  Symptoms are worse at night.  No other obvious alleviating or aggravating factors.  ROS: Per HPI  PMH: He reports that  has never smoked. he has never used smokeless tobacco. He reports that he drinks alcohol. He reports that he does not use drugs.  Objective:  Physical Exam: BP 126/84 (BP Location: Left Arm, Patient Position: Sitting, Cuff Size: Large)   Pulse 98   Temp 98.4 F (36.9 C) (Oral)   Ht 5' 5.75" (1.67 m)   Wt 220 lb 6.4 oz (100 kg)   SpO2 97%   BMI 35.84 kg/m   Gen: NAD, resting comfortably HEENT: TMs clear bilaterally.  Nasal mucosa boggy and erythematous with thick, green nasal discharge.  Oropharynx erythematous without exudate.  Maxillary sinuses with decreased transillumination bilaterally. CV: RRR with no murmurs appreciated Pulm: NWOB, CTAB with no crackles, wheezes, or rhonchi  Assessment/Plan:  Sinusitis Given 10+ day course, it is reasonable to start antibiotics.  We will start Augmentin today.  Will also start Atrovent to help him with his rhinorrhea and sinus congestion.  Encouraged other supportive measures including good oral hydration, rest, and Tylenol/Motrin as needed for pain and fever.  We discussed return precautions.  He will follow-up as needed.  Katina Degreealeb M. Jimmey RalphParker, MD 11/19/2017 12:10 PM

## 2017-11-19 NOTE — Patient Instructions (Signed)
Start the augmentin and atrovent. .  Please stay well hydrated.  You can take tylenol and/or motrin as needed for low grade fever and pain.  Please let me know if your symptoms worsen or fail to improve.  Take care, Dr Jimmey RalphParker

## 2017-11-30 ENCOUNTER — Ambulatory Visit: Payer: Medicare HMO | Admitting: Family Medicine

## 2018-01-25 DIAGNOSIS — R0600 Dyspnea, unspecified: Secondary | ICD-10-CM | POA: Diagnosis not present

## 2018-01-25 DIAGNOSIS — R51 Headache: Secondary | ICD-10-CM | POA: Diagnosis not present

## 2018-01-25 DIAGNOSIS — E669 Obesity, unspecified: Secondary | ICD-10-CM | POA: Diagnosis not present

## 2018-01-25 DIAGNOSIS — G4733 Obstructive sleep apnea (adult) (pediatric): Secondary | ICD-10-CM | POA: Diagnosis not present

## 2018-02-13 ENCOUNTER — Encounter (HOSPITAL_COMMUNITY): Payer: Self-pay | Admitting: Emergency Medicine

## 2018-02-13 ENCOUNTER — Other Ambulatory Visit: Payer: Self-pay

## 2018-02-13 ENCOUNTER — Emergency Department (HOSPITAL_COMMUNITY)
Admission: EM | Admit: 2018-02-13 | Discharge: 2018-02-13 | Disposition: A | Discharge status: Home / Self Care | Payer: Medicare HMO | Attending: Emergency Medicine | Admitting: Emergency Medicine

## 2018-02-13 DIAGNOSIS — Z79899 Other long term (current) drug therapy: Secondary | ICD-10-CM | POA: Diagnosis not present

## 2018-02-13 DIAGNOSIS — R0981 Nasal congestion: Secondary | ICD-10-CM | POA: Diagnosis not present

## 2018-02-13 DIAGNOSIS — J029 Acute pharyngitis, unspecified: Secondary | ICD-10-CM | POA: Diagnosis present

## 2018-02-13 DIAGNOSIS — J020 Streptococcal pharyngitis: Secondary | ICD-10-CM | POA: Diagnosis not present

## 2018-02-13 DIAGNOSIS — I10 Essential (primary) hypertension: Secondary | ICD-10-CM | POA: Diagnosis not present

## 2018-02-13 DIAGNOSIS — Z8673 Personal history of transient ischemic attack (TIA), and cerebral infarction without residual deficits: Secondary | ICD-10-CM | POA: Insufficient documentation

## 2018-02-13 DIAGNOSIS — J45909 Unspecified asthma, uncomplicated: Secondary | ICD-10-CM | POA: Diagnosis not present

## 2018-02-13 DIAGNOSIS — E785 Hyperlipidemia, unspecified: Secondary | ICD-10-CM | POA: Insufficient documentation

## 2018-02-13 DIAGNOSIS — Z7984 Long term (current) use of oral hypoglycemic drugs: Secondary | ICD-10-CM | POA: Insufficient documentation

## 2018-02-13 DIAGNOSIS — R7303 Prediabetes: Secondary | ICD-10-CM | POA: Diagnosis not present

## 2018-02-13 DIAGNOSIS — H9202 Otalgia, left ear: Secondary | ICD-10-CM | POA: Diagnosis not present

## 2018-02-13 DIAGNOSIS — R1310 Dysphagia, unspecified: Secondary | ICD-10-CM | POA: Diagnosis not present

## 2018-02-13 LAB — GROUP A STREP BY PCR: GROUP A STREP BY PCR: DETECTED — AB

## 2018-02-13 MED ORDER — PENICILLIN G BENZATHINE 1200000 UNIT/2ML IM SUSP
1.2000 10*6.[IU] | Freq: Once | INTRAMUSCULAR | Status: AC
  Administered 2018-02-13: 1.2 10*6.[IU] via INTRAMUSCULAR
  Filled 2018-02-13: qty 2

## 2018-02-13 MED ORDER — DEXAMETHASONE SODIUM PHOSPHATE 10 MG/ML IJ SOLN
10.0000 mg | Freq: Once | INTRAMUSCULAR | Status: AC
  Administered 2018-02-13: 10 mg via INTRAMUSCULAR
  Filled 2018-02-13: qty 1

## 2018-02-13 NOTE — ED Provider Notes (Signed)
[facility] EMERGENCY DEPARTMENT Provider Note   CSN: [id number] Arrival date & time: 02/13/18  1015     History   Chief Complaint Chief Complaint  Patient presents with  . Sore Throat    HPI Gary Clark is a 47 y.o. male with a hx of anxiety, asthma, depression, hyperlipidemia, migraines, and HTN who presents to the ED for sore throat that developed a few days ago. Patient states he has had progressive worsening of pain. States pain is mostly to L side of throat radiating to L ear. Rates it an 8/10 in severity, tried ibuprofen without relief. Patient's pain is worse with swallowing however he is able to swallow. Has had some associated congestion. His wife was recently dx with strep throat and received tx. Denies fever, dyspnea, or vomiting.  HPI  Past Medical History:  Diagnosis Date  . Allergy   . Anxiety   . Arthritis   . Asthma   . Back pain   . Blood in stool   . Cluster headache   . Depression   . Headache(784.0)    frequent   . Hyperlipidemia   . Hypertension   . Lumbosacral spondylosis without myelopathy 06/15/2013  . Migraines   . Postlaminectomy syndrome, lumbar region 03/08/2015   Status post L4-L5 decompression and fusion, anterior/posterior in 2011   . Pre-diabetes    pre diabetic per his PCP  . Sleep apnea    tested in 2014 positive   . Stroke [facility])    pt states "mini-Stroke"  . UTI (urinary tract infection)     Patient Active Problem List   Diagnosis Date Noted  . Back pain 10/01/2016  . Lumbar radiculitis 07/17/2016  . Lumbosacral radiculitis 05/19/2016  . Patellofemoral syndrome of both knees 05/19/2016  . Sacroiliac joint dysfunction of both sides 02/04/2016  . Obesity 06/03/2015  . Mild intermittent asthma 06/03/2015  . Major depressive disorder, recurrent, in remission (HCC) 06/03/2015  . Postlaminectomy syndrome, lumbar region 03/08/2015  . Migraine/Cluster HAs 10/14/2012  . Chronic low back pain - hx spinal stenosis,  decompression surgery in 2010, 2011 10/14/2012  . Hyperlipemia 10/14/2012  . Hypertension 10/14/2012    Past Surgical History:  Procedure Laterality Date  . ANTERIOR AND POSTERIOR SPINAL FUSION  2011  . ANTERIOR LAT LUMBAR FUSION N/A 10/01/2016   Procedure: XLIF L3-4 ANTERIOR LATERAL LUMBAR FUSION 1 LEVEL;  Surgeon: Venita Lick, MD;  Location: [facility] OR;  Service: Orthopedics;  Laterality: N/A;  . BACK SURGERY    . HARDWARE REMOVAL N/A 10/01/2016   Procedure: Removal of pedical screws L4-5 HARDWARE REMOVAL;  Surgeon: Venita Lick, MD;  Location: [facility] OR;  Service: Orthopedics;  Laterality: N/A;  . LUMBAR LAMINECTOMY/DECOMPRESSION MICRODISCECTOMY  2010        Home Medications    Prior to Admission medications   Medication Sig Start Date End Date Taking? Authorizing Provider  amoxicillin-clavulanate (AUGMENTIN) 875-125 MG tablet Take 1 tablet by mouth 2 (two) times daily. 11/19/17   Ardith Dark, MD  citalopram (CELEXA) 40 MG tablet Take 1 tablet (40 mg total) by mouth daily. 09/13/17   Terressa Koyanagi, DO  hydrochlorothiazide (HYDRODIURIL) 25 MG tablet TAKE 1 TABLET BY MOUTH ONCE DAILY (NEED  AN  APPOINTMENT) 05/14/17   Terressa Koyanagi, DO  hydrochlorothiazide (HYDRODIURIL) 25 MG tablet Take 1 tablet (25 mg total) by mouth daily. 09/13/17   Terressa Koyanagi, DO  ipratropium (ATROVENT) 0.06 % nasal spray Place 2 sprays into both nostrils  4 (four) times daily. 11/19/17   Ardith Dark, MD  lisinopril (PRINIVIL,ZESTRIL) 20 MG tablet Take 1.5 tablets (30 mg total) by mouth daily. Patient taking differently: Take 20 mg by mouth daily.  05/22/16   Terressa Koyanagi, DO  metFORMIN (GLUCOPHAGE) 500 MG tablet Take 1 tablet (500 mg total) by mouth 2 (two) times daily with a meal. Patient taking differently: Take 500 mg by mouth daily with breakfast.  12/20/15   Terressa Koyanagi, DO  methocarbamol (ROBAXIN) 500 MG tablet Take 1 tablet (500 mg total) by mouth 3 (three) times daily as needed for muscle spasms. 10/01/16    Venita Lick, MD  methocarbamol (ROBAXIN) 500 MG tablet Take 1 tablet (500 mg total) by mouth every 6 (six) hours as needed for muscle spasms. 10/03/16   Dixon, Katharina Caper, PA-C  ondansetron (ZOFRAN) 4 MG tablet Take 1 tablet (4 mg total) by mouth every 8 (eight) hours as needed for nausea or vomiting. 10/01/16   Venita Lick, MD  rosuvastatin (CRESTOR) 40 MG tablet Take 1 tablet (40 mg total) by mouth daily. 01/03/16   Terressa Koyanagi, DO  sildenafil (REVATIO) 20 MG tablet Take 2-5 tablets (40-100mg ) as needed prior to sexual activity. Use lowest effective dose. Do not take more then one dose in 24 hours. 06/22/16   Terressa Koyanagi, DO    Family History Family History  Problem Relation Age of Onset  . Heart disease Mother   . Hypertension Mother   . Arthritis Father   . Hypertension Father   . Prostate cancer Father   . Miscarriages / Stillbirths Maternal Grandmother   . Heart disease Maternal Grandfather   . Sudden death Maternal Grandfather   . Breast cancer Sister   . Migraines Sister     Social History Social History   Tobacco Use  . Smoking status: Never Smoker  . Smokeless tobacco: Never Used  Substance Use Topics  . Alcohol use: Yes    Comment: once a month  . Drug use: No     Allergies   Codeine   Review of Systems Review of Systems  Constitutional: Negative for chills and fever.  HENT: Positive for congestion, ear pain and sore throat.   Respiratory: Negative for shortness of breath.   Cardiovascular: Negative for chest pain.  Gastrointestinal: Negative for vomiting.  Musculoskeletal: Negative for neck stiffness.     Physical Exam Updated Vital Signs BP (!) 155/106 (BP Location: Right Arm)   Pulse 78   Temp 98.6 F (37 C) (Oral)   Resp 16   SpO2 96%   Physical Exam  Constitutional: He appears well-developed and well-nourished.  Non-toxic appearance. No distress.  HENT:  Head: Normocephalic and atraumatic.  Right Ear: No mastoid tenderness.  Tympanic membrane is not perforated, not erythematous, not retracted and not bulging. A middle ear effusion is present.  Left Ear: No mastoid tenderness. Tympanic membrane is not perforated, not erythematous, not retracted and not bulging. A middle ear effusion is present.  Nose: Mucosal edema (Congestion) present. Right sinus exhibits no maxillary sinus tenderness and no frontal sinus tenderness. Left sinus exhibits no maxillary sinus tenderness and no frontal sinus tenderness.  Mouth/Throat: Uvula is midline. Oropharyngeal exudate and posterior oropharyngeal erythema present. Tonsils are 2+ on the right. Tonsils are 2+ on the left.  Patient is tolerating his own secretions without difficulty.  No trismus.  No drooling.  No hot potato voice.  Submandibular compartment is soft.  Eyes: Conjunctivae are normal.  Right eye exhibits no discharge. Left eye exhibits no discharge.  Neck: Normal range of motion and full passive range of motion without pain. Neck supple. No neck rigidity.  Cardiovascular: Normal rate and regular rhythm.  Pulmonary/Chest: Effort normal and breath sounds normal. No respiratory distress.  Abdominal: He exhibits no distension.  Lymphadenopathy:    He has cervical adenopathy (Bilateral anterior).  Neurological: He is alert.  Psychiatric: He has a normal mood and affect. His behavior is normal. Thought content normal.  Nursing note and vitals reviewed.   ED Treatments / Results  Labs (all labs ordered are listed, but only abnormal results are displayed) Labs Reviewed  GROUP A STREP BY PCR - Abnormal; Notable for the following components:      Result Value   Group A Strep by PCR DETECTED (*)    All other components within normal limits    EKG None  Radiology No results found.  Procedures Procedures (including critical care time)  Medications Ordered in ED Medications  dexamethasone (DECADRON) injection 10 mg (has no administration in time range)  penicillin g  benzathine (BICILLIN LA) 1200000 UNIT/2ML injection 1.2 Million Units (has no administration in time range)     Initial Impression / Assessment and Plan / ED Course  I have reviewed the triage vital signs and the nursing notes.  Pertinent labs & imaging results that were available during my care of the patient were reviewed by me and considered in my medical decision making (see chart for details).    Presents with complaint of sore throat.  Patient is nontoxic-appearing, vitals are within normal limits with the exception of elevated BP, do not suspect HTN emergency, patient aware of need for recheck.  On exam patient with tonsillar erythema, exudates, and anterior cervical lymphadenopathy.  Strep test is positive.  Treated in the emergency department with IM Decadron and IM Penicillin.  Exam non concerning for PTA or RPA, there is no trismus, uvular deviation, or hot potato voice. Patient is tolerating his own secretions without difficulty, full ROM of the neck, submandibular compartment is soft. Recommended use of Tylenol and Ibuprofen for any continued discomfort or fevers. I discussed results, treatment plan, need for PCP follow-up, and return precautions with the patient. Provided opportunity for questions, patient confirmed understanding and is in agreement with plan.   Final Clinical Impressions(s) / ED Diagnoses   Final diagnoses:  Strep throat    ED Discharge Orders    None       Cherly Andersonetrucelli, Chantella Creech R, PA-C 02/13/18 1330    Doug SouJacubowitz, Sam, MD 02/13/18 [id number]

## 2018-02-13 NOTE — Discharge Instructions (Addendum)
You were seen in the emergency department and diagnosed with strep throat.  You were given a shot of Penicillin and a shot of Decadron.  - Penicillin is an antibiotic used to treat the infection - Decadron is a steroid used to treat the pain and swelling of your throat.   You should gradually feel better over the next few days. Take Tylenol and Ibuprofen per over the counter instructions for fever and pain. Follow up with your primary care provider in the next 1 week if you are not feeling better, if you do not have a primary care provider one is provided in your discharge instructions. Return to the emergency department for any new or worsening symptoms including but not limited to inability to open your mouth, inability to move your neck, worsening pain, change in your voice, inability to swallow your own saliva, drooling, or any other concerns.    Additionally have your blood pressure rechecked by your primary care provider within 1 month as it was elevated in the ER today.   Vitals:   02/13/18 1036  BP: (!) 155/106  Pulse: 78  Resp: 16  Temp: 98.6 F (37 C)  SpO2: 96%

## 2018-02-13 NOTE — ED Triage Notes (Signed)
The patient said his wife was just treated for strep.  Last night he began to have a sore throat, painful to swallow along with left ear pain.  No other symptoms.  Patient took 800mg  of ibuprofen with no relief.  He rates his pain 8/10.

## 2018-02-25 DIAGNOSIS — E669 Obesity, unspecified: Secondary | ICD-10-CM | POA: Diagnosis not present

## 2018-02-25 DIAGNOSIS — R51 Headache: Secondary | ICD-10-CM | POA: Diagnosis not present

## 2018-02-25 DIAGNOSIS — R0600 Dyspnea, unspecified: Secondary | ICD-10-CM | POA: Diagnosis not present

## 2018-02-25 DIAGNOSIS — G4733 Obstructive sleep apnea (adult) (pediatric): Secondary | ICD-10-CM | POA: Diagnosis not present

## 2018-03-27 DIAGNOSIS — E669 Obesity, unspecified: Secondary | ICD-10-CM | POA: Diagnosis not present

## 2018-03-27 DIAGNOSIS — G4733 Obstructive sleep apnea (adult) (pediatric): Secondary | ICD-10-CM | POA: Diagnosis not present

## 2018-03-27 DIAGNOSIS — R0600 Dyspnea, unspecified: Secondary | ICD-10-CM | POA: Diagnosis not present

## 2018-03-27 DIAGNOSIS — R51 Headache: Secondary | ICD-10-CM | POA: Diagnosis not present

## 2018-04-27 DIAGNOSIS — R51 Headache: Secondary | ICD-10-CM | POA: Diagnosis not present

## 2018-04-27 DIAGNOSIS — R0600 Dyspnea, unspecified: Secondary | ICD-10-CM | POA: Diagnosis not present

## 2018-04-27 DIAGNOSIS — E669 Obesity, unspecified: Secondary | ICD-10-CM | POA: Diagnosis not present

## 2018-04-27 DIAGNOSIS — G4733 Obstructive sleep apnea (adult) (pediatric): Secondary | ICD-10-CM | POA: Diagnosis not present

## 2018-05-20 DIAGNOSIS — Z981 Arthrodesis status: Secondary | ICD-10-CM | POA: Diagnosis not present

## 2018-05-20 DIAGNOSIS — M5416 Radiculopathy, lumbar region: Secondary | ICD-10-CM | POA: Diagnosis not present

## 2018-05-25 DIAGNOSIS — M545 Low back pain: Secondary | ICD-10-CM | POA: Diagnosis not present

## 2018-05-26 DIAGNOSIS — M5416 Radiculopathy, lumbar region: Secondary | ICD-10-CM | POA: Diagnosis not present

## 2018-05-27 DIAGNOSIS — Z981 Arthrodesis status: Secondary | ICD-10-CM | POA: Diagnosis not present

## 2018-05-27 DIAGNOSIS — M5416 Radiculopathy, lumbar region: Secondary | ICD-10-CM | POA: Diagnosis not present

## 2018-05-28 DIAGNOSIS — R51 Headache: Secondary | ICD-10-CM | POA: Diagnosis not present

## 2018-05-28 DIAGNOSIS — G4733 Obstructive sleep apnea (adult) (pediatric): Secondary | ICD-10-CM | POA: Diagnosis not present

## 2018-05-28 DIAGNOSIS — R0600 Dyspnea, unspecified: Secondary | ICD-10-CM | POA: Diagnosis not present

## 2018-05-28 DIAGNOSIS — E669 Obesity, unspecified: Secondary | ICD-10-CM | POA: Diagnosis not present

## 2018-06-17 DIAGNOSIS — Z7689 Persons encountering health services in other specified circumstances: Secondary | ICD-10-CM | POA: Diagnosis not present

## 2018-06-17 DIAGNOSIS — M545 Low back pain: Secondary | ICD-10-CM | POA: Diagnosis not present

## 2018-06-24 DIAGNOSIS — M545 Low back pain: Secondary | ICD-10-CM | POA: Diagnosis not present

## 2018-06-24 DIAGNOSIS — M5136 Other intervertebral disc degeneration, lumbar region: Secondary | ICD-10-CM | POA: Diagnosis not present

## 2018-06-24 DIAGNOSIS — M5416 Radiculopathy, lumbar region: Secondary | ICD-10-CM | POA: Diagnosis not present

## 2018-06-24 DIAGNOSIS — Z981 Arthrodesis status: Secondary | ICD-10-CM | POA: Diagnosis not present

## 2018-06-27 DIAGNOSIS — R0600 Dyspnea, unspecified: Secondary | ICD-10-CM | POA: Diagnosis not present

## 2018-06-27 DIAGNOSIS — G4733 Obstructive sleep apnea (adult) (pediatric): Secondary | ICD-10-CM | POA: Diagnosis not present

## 2018-06-27 DIAGNOSIS — E669 Obesity, unspecified: Secondary | ICD-10-CM | POA: Diagnosis not present

## 2018-06-27 DIAGNOSIS — R51 Headache: Secondary | ICD-10-CM | POA: Diagnosis not present

## 2018-06-30 DIAGNOSIS — M5136 Other intervertebral disc degeneration, lumbar region: Secondary | ICD-10-CM | POA: Diagnosis not present

## 2018-06-30 DIAGNOSIS — M5416 Radiculopathy, lumbar region: Secondary | ICD-10-CM | POA: Diagnosis not present

## 2018-07-12 DIAGNOSIS — M545 Low back pain: Secondary | ICD-10-CM | POA: Diagnosis not present

## 2018-07-14 ENCOUNTER — Ambulatory Visit (HOSPITAL_COMMUNITY)
Admission: EM | Admit: 2018-07-14 | Discharge: 2018-07-14 | Disposition: A | Discharge status: Home / Self Care | Payer: Medicare HMO | Attending: Family Medicine | Admitting: Family Medicine

## 2018-07-14 ENCOUNTER — Encounter (HOSPITAL_COMMUNITY): Payer: Self-pay | Admitting: *Deleted

## 2018-07-14 ENCOUNTER — Other Ambulatory Visit: Payer: Self-pay

## 2018-07-14 DIAGNOSIS — M5432 Sciatica, left side: Secondary | ICD-10-CM

## 2018-07-14 DIAGNOSIS — G8929 Other chronic pain: Secondary | ICD-10-CM | POA: Diagnosis not present

## 2018-07-14 MED ORDER — METHYLPREDNISOLONE ACETATE 40 MG/ML IJ SUSP
40.0000 mg | Freq: Once | INTRAMUSCULAR | Status: AC
  Administered 2018-07-14: 40 mg via INTRAMUSCULAR

## 2018-07-14 MED ORDER — PREDNISONE 10 MG (21) PO TBPK: ORAL_TABLET | Freq: Every day | ORAL | 0 refills | Status: DC

## 2018-07-14 MED ORDER — METHYLPREDNISOLONE ACETATE 40 MG/ML IJ SUSP
INTRAMUSCULAR | Status: AC
  Filled 2018-07-14: qty 1

## 2018-07-14 NOTE — Discharge Instructions (Addendum)
Steroid injection given here in clinic We will send you home with a steroid pack Follow-up with your doctor as scheduled on December 2 For continued worsening symptoms before then please follow-up

## 2018-07-14 NOTE — ED Triage Notes (Signed)
C/o pain lower back and left hip and leg. States he has back issues and has back surgery x 3 unable to see ortho til Dec. 2

## 2018-07-14 NOTE — ED Provider Notes (Signed)
[facility]    CSN: [id number] Arrival date & time: 07/14/18  7829     History   Chief Complaint Chief Complaint  Patient presents with  . Back Pain    HPI Gary Clark is a 47 y.o. male.   Pt is a 47 year old male here for 4 months of chronic back pain. This pain has worsened over the past week. He is having pain in the left lower lumbar region that radiates into the left leg with numbness, tingling, burning and weakness in the left leg.his left leg gives out on him at times.   He has hx of multiple back surgeries  and he cannot get into see his neurosurgeon until December 2nd. No fever, chills. Some mornings he cannot get out of the bed fast enough before he voids on himself due to the pain. He has the sensation of having to void. No loss of bowel. No saddle paraesthesia. He has been taking robaxin, gabapentin, ibuprofen and tylenol without any relief of the pain. 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   ROS per HPI      Past Medical History:  Diagnosis Date  . Allergy   . Anxiety   . Arthritis   . Asthma   . Back pain   . Blood in stool   . Cluster headache   . Depression   . Headache(784.0)    frequent   . Hyperlipidemia   . Hypertension   . Lumbosacral spondylosis without myelopathy 06/15/2013  . Migraines   . Postlaminectomy syndrome, lumbar region 03/08/2015   Status post L4-L5 decompression and fusion, anterior/posterior in 2011   . Pre-diabetes    pre diabetic per his PCP  . Sleep apnea    tested in 2014 positive   . Stroke [facility])    pt states "mini-Stroke"  . UTI (urinary tract infection)     Patient Active Problem List   Diagnosis Date Noted  . Back pain 10/01/2016  . Lumbar radiculitis 07/17/2016  . Lumbosacral radiculitis 05/19/2016  . Patellofemoral syndrome of both knees 05/19/2016  . Sacroiliac joint dysfunction of both sides  02/04/2016  . Obesity 06/03/2015  . Mild intermittent asthma 06/03/2015  . Major depressive disorder, recurrent, in remission (HCC) 06/03/2015  . Postlaminectomy syndrome, lumbar region 03/08/2015  . Migraine/Cluster HAs 10/14/2012  . Chronic low back pain - hx spinal stenosis, decompression surgery in 2010, 2011 10/14/2012  . Hyperlipemia 10/14/2012  . Hypertension 10/14/2012    Past Surgical History:  Procedure Laterality Date  . ANTERIOR AND POSTERIOR SPINAL FUSION  2011  . ANTERIOR LAT LUMBAR FUSION N/A 10/01/2016   Procedure: XLIF L3-4 ANTERIOR LATERAL LUMBAR FUSION 1 LEVEL;  Surgeon: Venita Lick, MD;  Location: [facility] OR;  Service: Orthopedics;  Laterality: N/A;  . BACK SURGERY    . HARDWARE REMOVAL N/A 10/01/2016   Procedure: Removal of pedical screws L4-5 HARDWARE REMOVAL;  Surgeon: Venita Lick, MD;  Location: [facility] OR;  Service: Orthopedics;  Laterality: N/A;  . LUMBAR LAMINECTOMY/DECOMPRESSION MICRODISCECTOMY  2010       Home Medications    Prior to Admission medications   Medication Sig Start Date End Date Taking? Authorizing Provider  amoxicillin-clavulanate (AUGMENTIN) 875-125 MG tablet Take 1 tablet by mouth 2 (two) times daily. 11/19/17   Ardith Dark, MD  citalopram (CELEXA) 40 MG tablet Take 1 tablet (40 mg  total) by mouth daily. 09/13/17   Terressa Koyanagi, DO  hydrochlorothiazide (HYDRODIURIL) 25 MG tablet TAKE 1 TABLET BY MOUTH ONCE DAILY (NEED  AN  APPOINTMENT) 05/14/17   Terressa Koyanagi, DO  hydrochlorothiazide (HYDRODIURIL) 25 MG tablet Take 1 tablet (25 mg total) by mouth daily. 09/13/17   Kriste Basque R, DO  ipratropium (ATROVENT) 0.06 % nasal spray Place 2 sprays into both nostrils 4 (four) times daily. 11/19/17   Ardith Dark, MD  lisinopril (PRINIVIL,ZESTRIL) 20 MG tablet Take 1.5 tablets (30 mg total) by mouth daily. Patient taking differently: Take 20 mg by mouth daily.  05/22/16   Terressa Koyanagi, DO  metFORMIN (GLUCOPHAGE) 500 MG tablet Take 1 tablet (500 mg  total) by mouth 2 (two) times daily with a meal. Patient taking differently: Take 500 mg by mouth daily with breakfast.  12/20/15   Terressa Koyanagi, DO  methocarbamol (ROBAXIN) 500 MG tablet Take 1 tablet (500 mg total) by mouth 3 (three) times daily as needed for muscle spasms. 10/01/16   Venita Lick, MD  methocarbamol (ROBAXIN) 500 MG tablet Take 1 tablet (500 mg total) by mouth every 6 (six) hours as needed for muscle spasms. 10/03/16   Dixon, Katharina Caper, PA-C  ondansetron (ZOFRAN) 4 MG tablet Take 1 tablet (4 mg total) by mouth every 8 (eight) hours as needed for nausea or vomiting. 10/01/16   Venita Lick, MD  predniSONE (STERAPRED UNI-PAK 21 TAB) 10 MG (21) TBPK tablet Take by mouth daily. 6 tabs  for 2 days,  5 tabs for 2 days,  4 tabs for 2 days, 3 tabs for 2 days, 2 tabs for 2 days,  1 tab  for 2 days 07/14/18   Dahlia Byes A, NP  rosuvastatin (CRESTOR) 40 MG tablet Take 1 tablet (40 mg total) by mouth daily. 01/03/16   Terressa Koyanagi, DO  sildenafil (REVATIO) 20 MG tablet Take 2-5 tablets (40-100mg ) as needed prior to sexual activity. Use lowest effective dose. Do not take more then one dose in 24 hours. 06/22/16   Terressa Koyanagi, DO    Family History Family History  Problem Relation Age of Onset  . Heart disease Mother   . Hypertension Mother   . Arthritis Father   . Hypertension Father   . Prostate cancer Father   . Miscarriages / Stillbirths Maternal Grandmother   . Heart disease Maternal Grandfather   . Sudden death Maternal Grandfather   . Breast cancer Sister   . Migraines Sister     Social History Social History   Tobacco Use  . Smoking status: Never Smoker  . Smokeless tobacco: Never Used  Substance Use Topics  . Alcohol use: Yes    Comment: once a month  . Drug use: No     Allergies   Codeine   Review of Systems Review of Systems   Physical Exam Triage Vital Signs ED Triage Vitals  Enc Vitals Group     BP 07/14/18 0832 (!) 176/126     Pulse Rate  07/14/18 0832 90     Resp 07/14/18 0832 18     Temp 07/14/18 0832 98.4 F (36.9 C)     Temp Source 07/14/18 0832 Oral     SpO2 07/14/18 0832 98 %     Weight --      Height --      Head Circumference --      Peak Flow --      Pain Score 07/14/18  1610 10     Pain Loc --      Pain Edu? --      Excl. in GC? --    No data found.  Updated Vital Signs BP (!) 176/126 (BP Location: Right Arm)   Pulse 90   Temp 98.4 F (36.9 C) (Oral)   Resp 18   SpO2 98%   Visual Acuity Right Eye Distance:   Left Eye Distance:   Bilateral Distance:    Right Eye Near:   Left Eye Near:    Bilateral Near:     Physical Exam  Constitutional: He appears well-developed and well-nourished.  Appears in pain   HENT:  Head: Normocephalic and atraumatic.  Eyes: Conjunctivae are normal.  Neck: Normal range of motion.  Pulmonary/Chest: Effort normal.  Musculoskeletal: Normal range of motion.  Mild tenderness to the lower lumbar spine and sciatic notch. Positive left straight leg raise.   Neurological: He is alert.  Skin: Skin is warm.  Psychiatric: He has a normal mood and affect.  Nursing note and vitals reviewed.    UC Treatments / Results  Labs (all labs ordered are listed, but only abnormal results are displayed) Labs Reviewed - No data to display  EKG None  Radiology No results found.  Procedures Procedures (including critical care time)  Medications Ordered in UC Medications  methylPREDNISolone acetate (DEPO-MEDROL) injection 40 mg (40 mg Intramuscular Given 07/14/18 0927)    Initial Impression / Assessment and Plan / UC Course  I have reviewed the triage vital signs and the nursing notes.  Pertinent labs & imaging results that were available during my care of the patient were reviewed by me and considered in my medical decision making (see chart for details).     Steroid injection given here in the clinic Prednisone  pack given to taper  Instructed not to take NSAID'S  while taking the prednisone.  He can continue the other medications he is prescribed Follow up as needed for continued or worsening symptoms   Final Clinical Impressions(s) / UC Diagnoses   Final diagnoses:  Sciatica of left side     Discharge Instructions     Steroid injection given here in clinic We will send you home with a steroid pack Follow-up with your doctor as scheduled on December 2 For continued worsening symptoms before then please follow-up    ED Prescriptions    Medication Sig Dispense Auth. Provider   predniSONE (STERAPRED UNI-PAK 21 TAB) 10 MG (21) TBPK tablet Take by mouth daily. 6 tabs  for 2 days,  5 tabs for 2 days,  4 tabs for 2 days, 3 tabs for 2 days, 2 tabs for 2 days,  1 tab  for 2 days 42 tablet Jobany Montellano A, NP     Controlled Substance Prescriptions [place] Controlled Substance Registry consulted? Not Applicable   Janace Aris, NP 07/14/18 1003

## 2018-07-19 ENCOUNTER — Telehealth (HOSPITAL_COMMUNITY): Payer: Self-pay | Admitting: Emergency Medicine

## 2018-07-19 NOTE — Telephone Encounter (Signed)
Left message

## 2018-07-20 ENCOUNTER — Telehealth (HOSPITAL_COMMUNITY): Payer: Self-pay

## 2018-07-20 MED ORDER — CYCLOBENZAPRINE HCL 10 MG PO TABS: 10.0000 mg | ORAL_TABLET | Freq: Two times a day (BID) | ORAL | 0 refills | Status: DC

## 2018-07-20 NOTE — Telephone Encounter (Signed)
Pt called stating he was still having pain.  Per traci bast Flexeril 10 mg bid prn x 30 days until he sees his doctor.

## 2018-07-26 ENCOUNTER — Ambulatory Visit (HOSPITAL_COMMUNITY)
Admission: EM | Admit: 2018-07-26 | Discharge: 2018-07-26 | Disposition: A | Discharge status: Home / Self Care | Payer: Medicare HMO | Attending: Family Medicine | Admitting: Family Medicine

## 2018-07-26 ENCOUNTER — Encounter (HOSPITAL_COMMUNITY): Payer: Self-pay

## 2018-07-26 DIAGNOSIS — M5116 Intervertebral disc disorders with radiculopathy, lumbar region: Secondary | ICD-10-CM | POA: Diagnosis not present

## 2018-07-26 MED ORDER — TIZANIDINE HCL 4 MG PO TABS: 4.0000 mg | ORAL_TABLET | Freq: Four times a day (QID) | ORAL | 0 refills | Status: DC | PRN

## 2018-07-26 MED ORDER — HYDROCODONE-ACETAMINOPHEN 7.5-325 MG PO TABS: 1.0000 | ORAL_TABLET | Freq: Four times a day (QID) | ORAL | 0 refills | Status: DC | PRN

## 2018-07-26 MED ORDER — METHYLPREDNISOLONE 4 MG PO TBPK: ORAL_TABLET | ORAL | 0 refills | Status: DC

## 2018-07-26 MED ORDER — IBUPROFEN 800 MG PO TABS: 800.0000 mg | ORAL_TABLET | Freq: Three times a day (TID) | ORAL | 0 refills | Status: DC | PRN

## 2018-07-26 NOTE — Discharge Instructions (Signed)
Take the Medrol Dosepak.  The steroid will help with nerve inflammation.  Take all of day 1 today. After you finish the Medrol take ibuprofen 3 times a day with food Try tizanidine as a muscle relaxer.  You may take 1 or 2 tabs 3 times a day.  This is useful at night Take pain medicine as needed.  Caution drowsiness.  Caution constipation.  This is also useful for sleep. See your spine specialist December 2 as scheduled

## 2018-07-26 NOTE — ED Triage Notes (Signed)
Pt presents with severe back pain from a chronic back issue; was treated a few weeks and has returned to get seen again .  Pt states he doesn't have an appointment with his neurologist until December.

## 2018-07-26 NOTE — ED Provider Notes (Signed)
[facility]    CSN: [id number] Arrival date & time: 07/26/18  4098     History   Chief Complaint Chief Complaint  Patient presents with  . Back Pain    HPI Gary Clark is a 47 y.o. male.   HPI  She has significant lumbar degenerative disc disease.  He has had multiple surgeries.  He has chronic back pain on a daily basis.  He takes gabapentin for pain.  He takes ibuprofen as needed.  His pain has been severe with left leg pain for the last couple of weeks.  He was seen here and treated with a Medrol pack.  He thinks it helped somewhat.  He is back because of the severe pain, and inability to get an appointment with his spine specialist until December 2. No bowel or bladder complaint.  No saddle anesthesia.  No trauma or injury.  He has had recent MRI scanning. Past Medical History:  Diagnosis Date  . Allergy   . Anxiety   . Arthritis   . Asthma   . Back pain   . Blood in stool   . Cluster headache   . Depression   . Headache(784.0)    frequent   . Hyperlipidemia   . Hypertension   . Lumbosacral spondylosis without myelopathy 06/15/2013  . Migraines   . Postlaminectomy syndrome, lumbar region 03/08/2015   Status post L4-L5 decompression and fusion, anterior/posterior in 2011   . Pre-diabetes    pre diabetic per his PCP  . Sleep apnea    tested in 2014 positive   . Stroke [facility])    pt states "mini-Stroke"  . UTI (urinary tract infection)     Patient Active Problem List   Diagnosis Date Noted  . Back pain 10/01/2016  . Lumbar radiculitis 07/17/2016  . Lumbosacral radiculitis 05/19/2016  . Patellofemoral syndrome of both knees 05/19/2016  . Sacroiliac joint dysfunction of both sides 02/04/2016  . Obesity 06/03/2015  . Mild intermittent asthma 06/03/2015  . Major depressive disorder, recurrent, in remission (HCC) 06/03/2015  . Postlaminectomy syndrome, lumbar region 03/08/2015  . Migraine/Cluster HAs 10/14/2012  . Chronic low back pain - hx spinal  stenosis, decompression surgery in 2010, 2011 10/14/2012  . Hyperlipemia 10/14/2012  . Hypertension 10/14/2012    Past Surgical History:  Procedure Laterality Date  . ANTERIOR AND POSTERIOR SPINAL FUSION  2011  . ANTERIOR LAT LUMBAR FUSION N/A 10/01/2016   Procedure: XLIF L3-4 ANTERIOR LATERAL LUMBAR FUSION 1 LEVEL;  Surgeon: Venita Lick, MD;  Location: [facility];  Service: Orthopedics;  Laterality: N/A;  . BACK SURGERY    . HARDWARE REMOVAL N/A 10/01/2016   Procedure: Removal of pedical screws L4-5 HARDWARE REMOVAL;  Surgeon: Venita Lick, MD;  Location: [facility] OR;  Service: Orthopedics;  Laterality: N/A;  . LUMBAR LAMINECTOMY/DECOMPRESSION MICRODISCECTOMY  2010       Home Medications    Prior to Admission medications   Medication Sig Start Date End Date Taking? Authorizing Provider  citalopram (CELEXA) 40 MG tablet Take 1 tablet (40 mg total) by mouth daily. 09/13/17   Terressa Koyanagi, DO  hydrochlorothiazide (HYDRODIURIL) 25 MG tablet Take 1 tablet (25 mg total) by mouth daily. 09/13/17   Terressa Koyanagi, DO  HYDROcodone-acetaminophen (NORCO) 7.5-325 MG tablet Take 1 tablet by mouth every 6 (six) hours as needed for moderate pain. 07/26/18   Eustace Moore, MD  ibuprofen (ADVIL,MOTRIN) 800 MG tablet Take 1 tablet (800 mg total) by mouth every 8 (  eight) hours as needed for moderate pain. 07/26/18   Eustace Moore, MD  ipratropium (ATROVENT) 0.06 % nasal spray Place 2 sprays into both nostrils 4 (four) times daily. 11/19/17   Ardith Dark, MD  lisinopril (PRINIVIL,ZESTRIL) 20 MG tablet Take 1.5 tablets (30 mg total) by mouth daily. Patient taking differently: Take 20 mg by mouth daily.  05/22/16   Terressa Koyanagi, DO  metFORMIN (GLUCOPHAGE) 500 MG tablet Take 1 tablet (500 mg total) by mouth 2 (two) times daily with a meal. Patient taking differently: Take 500 mg by mouth daily with breakfast.  12/20/15   Terressa Koyanagi, DO  methylPREDNISolone (MEDROL DOSEPAK) 4 MG TBPK tablet tad 07/26/18    Eustace Moore, MD  rosuvastatin (CRESTOR) 40 MG tablet Take 1 tablet (40 mg total) by mouth daily. 01/03/16   Terressa Koyanagi, DO  sildenafil (REVATIO) 20 MG tablet Take 2-5 tablets (40-100mg ) as needed prior to sexual activity. Use lowest effective dose. Do not take more then one dose in 24 hours. 06/22/16   Terressa Koyanagi, DO  tiZANidine (ZANAFLEX) 4 MG tablet Take 1 tablet (4 mg total) by mouth every 6 (six) hours as needed for muscle spasms. 07/26/18   Eustace Moore, MD    Family History Family History  Problem Relation Age of Onset  . Heart disease Mother   . Hypertension Mother   . Arthritis Father   . Hypertension Father   . Prostate cancer Father   . Miscarriages / Stillbirths Maternal Grandmother   . Heart disease Maternal Grandfather   . Sudden death Maternal Grandfather   . Breast cancer Sister   . Migraines Sister     Social History Social History   Tobacco Use  . Smoking status: Never Smoker  . Smokeless tobacco: Never Used  Substance Use Topics  . Alcohol use: Yes    Comment: once a month  . Drug use: No     Allergies   Codeine   Review of Systems Review of Systems  Constitutional: Negative for chills and fever.  HENT: Negative for ear pain and sore throat.   Eyes: Negative for pain and visual disturbance.  Respiratory: Negative for cough and shortness of breath.   Cardiovascular: Negative for chest pain and palpitations.  Gastrointestinal: Negative for abdominal pain and vomiting.  Genitourinary: Negative for dysuria and hematuria.  Musculoskeletal: Positive for back pain and gait problem. Negative for arthralgias.  Skin: Negative for color change and rash.  Neurological: Negative for seizures and syncope.  All other systems reviewed and are negative.    Physical Exam Triage Vital Signs ED Triage Vitals  Enc Vitals Group     BP 07/26/18 0854 (!) 183/126     Pulse Rate 07/26/18 0854 (!) 103     Resp 07/26/18 0854 20     Temp 07/26/18  0854 98.4 F (36.9 C)     Temp Source 07/26/18 0854 Oral     SpO2 07/26/18 0854 99 %     Weight --      Height --      Head Circumference --      Peak Flow --      Pain Score 07/26/18 0858 10     Pain Loc --      Pain Edu? --      Excl. in GC? --    No data found.  Updated Vital Signs BP (!) 183/126 (BP Location: Right Arm)   Pulse (!) 103  Temp 98.4 F (36.9 C) (Oral)   Resp 20   SpO2 99%       Physical Exam  Constitutional: He appears well-developed and well-nourished. No distress.  Acutely uncomfortable.  Moves slowly.  HENT:  Head: Normocephalic and atraumatic.  Mouth/Throat: Oropharynx is clear and moist.  Eyes: Pupils are equal, round, and reactive to light. Conjunctivae are normal.  Neck: Normal range of motion.  Cardiovascular: Normal rate.  Pulmonary/Chest: Effort normal. No respiratory distress.  Abdominal: Soft. He exhibits no distension.  Musculoskeletal: Normal range of motion. He exhibits no edema.  Multiple scars on low back, well-healed.  Left lumbar column of muscles is tense and tender.  Leg raising is positive on the left for increased lateral thigh pain.  Knee reflexes are symmetric.  Left ankle reflex is absent.  Neurological: He is alert.  Skin: Skin is warm and dry.     UC Treatments / Results  Labs (all labs ordered are listed, but only abnormal results are displayed) Labs Reviewed - No data to display  EKG None  Radiology No results found.  Procedures Procedures (including critical care time)  Medications Ordered in UC Medications - No data to display  Initial Impression / Assessment and Plan / UC Course  I have reviewed the triage vital signs and the nursing notes.  Pertinent labs & imaging results that were available during my care of the patient were reviewed by me and considered in my medical decision making (see chart for details).      Final Clinical Impressions(s) / UC Diagnoses   We talked about conservative  management of low back pain.  Has chronic pain.  Discussed the pitfalls of narcotic therapy.  I told him that the are to be used short-term only, and not long-term.  Because he is so uncomfortable I am giving him a limited number of pain pills today.  He will also have another Dosepak, and ibuprofen.  In the past Flexeril and Robaxin have not helped him much.  We will try tizanidine.  Activity as tolerated. Final diagnoses:  Lumbar disc disease with radiculopathy     Discharge Instructions     Take the Medrol Dosepak.  The steroid will help with nerve inflammation.  Take all of day 1 today. After you finish the Medrol take ibuprofen 3 times a day with food Try tizanidine as a muscle relaxer.  You may take 1 or 2 tabs 3 times a day.  This is useful at night Take pain medicine as needed.  Caution drowsiness.  Caution constipation.  This is also useful for sleep. See your spine specialist December 2 as scheduled   ED Prescriptions    Medication Sig Dispense Auth. Provider   tiZANidine (ZANAFLEX) 4 MG tablet Take 1 tablet (4 mg total) by mouth every 6 (six) hours as needed for muscle spasms. 30 tablet Eustace Moore, MD   ibuprofen (ADVIL,MOTRIN) 800 MG tablet Take 1 tablet (800 mg total) by mouth every 8 (eight) hours as needed for moderate pain. 90 tablet Eustace Moore, MD   methylPREDNISolone (MEDROL DOSEPAK) 4 MG TBPK tablet tad 21 tablet Eustace Moore, MD   HYDROcodone-acetaminophen [facility]) 7.5-325 MG tablet Take 1 tablet by mouth every 6 (six) hours as needed for moderate pain. 20 tablet Eustace Moore, MD     Controlled Substance Prescriptions [place] Controlled Substance Registry consulted? Yes, I have consulted the [place] Controlled Substances Registry for this patient, and feel the risk/benefit ratio today is favorable  for proceeding with this prescription for a controlled substance.   Eustace MooreNelson, Senetra Dillin Sue, MD 07/26/18 [phone]

## 2018-07-28 DIAGNOSIS — G4733 Obstructive sleep apnea (adult) (pediatric): Secondary | ICD-10-CM | POA: Diagnosis not present

## 2018-07-28 DIAGNOSIS — R51 Headache: Secondary | ICD-10-CM | POA: Diagnosis not present

## 2018-07-28 DIAGNOSIS — R0600 Dyspnea, unspecified: Secondary | ICD-10-CM | POA: Diagnosis not present

## 2018-07-28 DIAGNOSIS — E669 Obesity, unspecified: Secondary | ICD-10-CM | POA: Diagnosis not present

## 2018-08-03 ENCOUNTER — Other Ambulatory Visit: Payer: Self-pay | Admitting: Family Medicine

## 2018-08-15 DIAGNOSIS — M5416 Radiculopathy, lumbar region: Secondary | ICD-10-CM | POA: Diagnosis not present

## 2018-08-15 DIAGNOSIS — M545 Low back pain: Secondary | ICD-10-CM | POA: Diagnosis not present

## 2018-08-15 DIAGNOSIS — Z981 Arthrodesis status: Secondary | ICD-10-CM | POA: Diagnosis not present

## 2018-08-24 ENCOUNTER — Emergency Department (HOSPITAL_COMMUNITY): Payer: Medicare HMO

## 2018-08-24 ENCOUNTER — Emergency Department (HOSPITAL_COMMUNITY)
Admission: EM | Admit: 2018-08-24 | Discharge: 2018-08-24 | Disposition: A | Discharge status: Home / Self Care | Payer: Medicare HMO | Attending: Emergency Medicine | Admitting: Emergency Medicine

## 2018-08-24 ENCOUNTER — Encounter (HOSPITAL_COMMUNITY): Payer: Self-pay

## 2018-08-24 DIAGNOSIS — J45909 Unspecified asthma, uncomplicated: Secondary | ICD-10-CM | POA: Insufficient documentation

## 2018-08-24 DIAGNOSIS — R109 Unspecified abdominal pain: Secondary | ICD-10-CM | POA: Insufficient documentation

## 2018-08-24 DIAGNOSIS — Z7984 Long term (current) use of oral hypoglycemic drugs: Secondary | ICD-10-CM | POA: Diagnosis not present

## 2018-08-24 DIAGNOSIS — Z79899 Other long term (current) drug therapy: Secondary | ICD-10-CM | POA: Diagnosis not present

## 2018-08-24 DIAGNOSIS — I10 Essential (primary) hypertension: Secondary | ICD-10-CM | POA: Insufficient documentation

## 2018-08-24 DIAGNOSIS — R1031 Right lower quadrant pain: Secondary | ICD-10-CM | POA: Diagnosis not present

## 2018-08-24 LAB — BASIC METABOLIC PANEL
Anion gap: 15 (ref 5–15)
BUN: 17 mg/dL (ref 6–20)
CO2: 22 mmol/L (ref 22–32)
Calcium: 9.6 mg/dL (ref 8.9–10.3)
Chloride: 103 mmol/L (ref 98–111)
Creatinine, Ser: 0.94 mg/dL (ref 0.61–1.24)
GFR calc Af Amer: >60 mL/min (ref >60)
GFR calc non Af Amer: >60 mL/min (ref >60)
Glucose, Bld: 143 mg/dL — ABNORMAL HIGH (ref 70–99)
Potassium: 3.3 mmol/L — ABNORMAL LOW (ref 3.5–5.1)
Sodium: 140 mmol/L (ref 135–145)

## 2018-08-24 LAB — CBC WITH DIFFERENTIAL/PLATELET
Abs Immature Granulocytes: 0.05 10*3/uL (ref 0.00–0.07)
Basophils Absolute: 0 10*3/uL (ref 0.0–0.1)
Basophils Relative: 0 %
Eosinophils Absolute: 0.1 10*3/uL (ref 0.0–0.5)
Eosinophils Relative: 2 %
HCT: 43.3 % (ref 39.0–52.0)
Hemoglobin: 14.4 g/dL (ref 13.0–17.0)
Immature Granulocytes: 1 %
Lymphocytes Relative: 30 %
Lymphs Abs: 2.4 10*3/uL (ref 0.7–4.0)
MCH: 32.4 pg (ref 26.0–34.0)
MCHC: 33.3 g/dL (ref 30.0–36.0)
MCV: 97.5 fL (ref 80.0–100.0)
Monocytes Absolute: 0.5 10*3/uL (ref 0.1–1.0)
Monocytes Relative: 7 %
Neutro Abs: 4.9 10*3/uL (ref 1.7–7.7)
Neutrophils Relative %: 60 %
Platelets: 309 10*3/uL (ref 150–400)
RBC: 4.44 MIL/uL (ref 4.22–5.81)
RDW: 12 % (ref 11.5–15.5)
WBC: 8 10*3/uL (ref 4.0–10.5)
nRBC: 0 % (ref 0.0–0.2)

## 2018-08-24 LAB — URINALYSIS, ROUTINE W REFLEX MICROSCOPIC
Bilirubin Urine: NEGATIVE
Glucose, UA: NEGATIVE mg/dL
Hgb urine dipstick: NEGATIVE
Ketones, ur: NEGATIVE mg/dL
Leukocytes, UA: NEGATIVE
Nitrite: NEGATIVE
Protein, ur: NEGATIVE mg/dL
Specific Gravity, Urine: 1.02 (ref 1.005–1.030)
pH: 5 (ref 5.0–8.0)

## 2018-08-24 MED ORDER — FENTANYL CITRATE (PF) 100 MCG/2ML IJ SOLN: 50.0000 ug | Freq: Once | INTRAMUSCULAR | Status: DC

## 2018-08-24 MED ORDER — KETOROLAC TROMETHAMINE 30 MG/ML IJ SOLN
30.0000 mg | Freq: Once | INTRAMUSCULAR | Status: AC
Start: 2018-08-24 — End: 2018-08-24
  Administered 2018-08-24: 30 mg via INTRAMUSCULAR
  Filled 2018-08-24: qty 1

## 2018-08-24 NOTE — ED Notes (Signed)
Declined W/C at D/C and was escorted to lobby by RN. 

## 2018-08-24 NOTE — Discharge Instructions (Addendum)
Please read attached information. If you experience any new or worsening signs or symptoms please return to the emergency room for evaluation. Please follow-up with your primary care provider or specialist as discussed.  °

## 2018-08-24 NOTE — ED Triage Notes (Signed)
Pt to BR to collect urine sample.

## 2018-08-24 NOTE — ED Triage Notes (Signed)
Pt presents with 2 day h/o R flank pain.  Pt denies any injury, reports pain is constant and does not radiate.  Pt reports movement worsens pain; reports no dysuria but reports urine is darker than normal.

## 2018-08-24 NOTE — ED Provider Notes (Signed)
[facility] EMERGENCY DEPARTMENT Provider Note   CSN: [id number] Arrival date & time: 08/24/18  1218     History   Chief Complaint Chief Complaint  Patient presents with  . Flank Pain    HPI Gary Clark is a 47 y.o. male.  HPI   47 year old male presents today with right-sided flank pain.  Patient notes a history of chronic low back pain.  He is currently followed as an outpatient.  He notes some baseline radiculopathy in his left lower extremity.  He notes yesterday while walking he developed a sharp pain in his right flank.  He notes his pain is worse with movements, worse with palpation.  He notes this does not feel similar to previous episodes of back pain, he notes history of kidney stones and notes this feels somewhat similar to this.  He denies any abdominal pain nausea vomiting or fever, denies any urinary complaints.  No change to his distal neurovascular status.  Past Medical History:  Diagnosis Date  . Allergy   . Anxiety   . Arthritis   . Asthma   . Back pain   . Blood in stool   . Cluster headache   . Depression   . Headache(784.0)    frequent   . Hyperlipidemia   . Hypertension   . Lumbosacral spondylosis without myelopathy 06/15/2013  . Migraines   . Postlaminectomy syndrome, lumbar region 03/08/2015   Status post L4-L5 decompression and fusion, anterior/posterior in 2011   . Pre-diabetes    pre diabetic per his PCP  . Sleep apnea    tested in 2014 positive   . Stroke [facility])    pt states "mini-Stroke"  . UTI (urinary tract infection)     Patient Active Problem List   Diagnosis Date Noted  . Back pain 10/01/2016  . Lumbar radiculitis 07/17/2016  . Lumbosacral radiculitis 05/19/2016  . Patellofemoral syndrome of both knees 05/19/2016  . Sacroiliac joint dysfunction of both sides 02/04/2016  . Obesity 06/03/2015  . Mild intermittent asthma 06/03/2015  . Major depressive disorder, recurrent, in remission (HCC) 06/03/2015  .  Postlaminectomy syndrome, lumbar region 03/08/2015  . Migraine/Cluster HAs 10/14/2012  . Chronic low back pain - hx spinal stenosis, decompression surgery in 2010, 2011 10/14/2012  . Hyperlipemia 10/14/2012  . Hypertension 10/14/2012    Past Surgical History:  Procedure Laterality Date  . ANTERIOR AND POSTERIOR SPINAL FUSION  2011  . ANTERIOR LAT LUMBAR FUSION N/A 10/01/2016   Procedure: XLIF L3-4 ANTERIOR LATERAL LUMBAR FUSION 1 LEVEL;  Surgeon: Venita Lick, MD;  Location: [facility];  Service: Orthopedics;  Laterality: N/A;  . BACK SURGERY    . HARDWARE REMOVAL N/A 10/01/2016   Procedure: Removal of pedical screws L4-5 HARDWARE REMOVAL;  Surgeon: Venita Lick, MD;  Location: [facility];  Service: Orthopedics;  Laterality: N/A;  . LUMBAR LAMINECTOMY/DECOMPRESSION MICRODISCECTOMY  2010        Home Medications    Prior to Admission medications   Medication Sig Start Date End Date Taking? Authorizing Provider  citalopram (CELEXA) 40 MG tablet Take 1 tablet (40 mg total) by mouth daily. 09/13/17   Terressa Koyanagi, DO  citalopram (CELEXA) 40 MG tablet TAKE 1 TABLET BY MOUTH ONCE DAILY 08/04/18   Terressa Koyanagi, DO  hydrochlorothiazide (HYDRODIURIL) 25 MG tablet Take 1 tablet (25 mg total) by mouth daily. 09/13/17   Terressa Koyanagi, DO  HYDROcodone-acetaminophen (NORCO) 7.5-325 MG tablet Take 1 tablet by mouth every 6 (six)  hours as needed for moderate pain. 07/26/18   Eustace Moore, MD  ibuprofen (ADVIL,MOTRIN) 800 MG tablet Take 1 tablet (800 mg total) by mouth every 8 (eight) hours as needed for moderate pain. 07/26/18   Eustace Moore, MD  ipratropium (ATROVENT) 0.06 % nasal spray Place 2 sprays into both nostrils 4 (four) times daily. 11/19/17   Ardith Dark, MD  lisinopril (PRINIVIL,ZESTRIL) 20 MG tablet Take 1.5 tablets (30 mg total) by mouth daily. Patient taking differently: Take 20 mg by mouth daily.  05/22/16   Terressa Koyanagi, DO  metFORMIN (GLUCOPHAGE) 500 MG tablet Take 1 tablet  (500 mg total) by mouth 2 (two) times daily with a meal. Patient taking differently: Take 500 mg by mouth daily with breakfast.  12/20/15   Terressa Koyanagi, DO  methylPREDNISolone (MEDROL DOSEPAK) 4 MG TBPK tablet tad 07/26/18   Eustace Moore, MD  rosuvastatin (CRESTOR) 40 MG tablet Take 1 tablet (40 mg total) by mouth daily. 01/03/16   Terressa Koyanagi, DO  sildenafil (REVATIO) 20 MG tablet Take 2-5 tablets (40-100mg ) as needed prior to sexual activity. Use lowest effective dose. Do not take more then one dose in 24 hours. 06/22/16   Terressa Koyanagi, DO  tiZANidine (ZANAFLEX) 4 MG tablet Take 1 tablet (4 mg total) by mouth every 6 (six) hours as needed for muscle spasms. 07/26/18   Eustace Moore, MD    Family History Family History  Problem Relation Age of Onset  . Heart disease Mother   . Hypertension Mother   . Arthritis Father   . Hypertension Father   . Prostate cancer Father   . Miscarriages / Stillbirths Maternal Grandmother   . Heart disease Maternal Grandfather   . Sudden death Maternal Grandfather   . Breast cancer Sister   . Migraines Sister     Social History Social History   Tobacco Use  . Smoking status: Never Smoker  . Smokeless tobacco: Never Used  Substance Use Topics  . Alcohol use: Yes    Comment: once a month  . Drug use: No     Allergies   Codeine   Review of Systems Review of Systems  All other systems reviewed and are negative.    Physical Exam Updated Vital Signs BP (!) 173/120 (BP Location: Right Arm)   Pulse (!) 111   Temp 97.9 F (36.6 C) (Oral)   Resp 18   Ht 5\' 6"  (1.676 m)   Wt 99.8 kg   SpO2 96%   BMI 35.51 kg/m   Physical Exam  Constitutional: He is oriented to person, place, and time. He appears well-developed and well-nourished.  HENT:  Head: Normocephalic and atraumatic.  Eyes: Pupils are equal, round, and reactive to light. Conjunctivae are normal. Right eye exhibits no discharge. Left eye exhibits no discharge. No  scleral icterus.  Neck: Normal range of motion. No JVD present. No tracheal deviation present.  Pulmonary/Chest: Effort normal. No stridor.  Abdominal:  Abdomen soft nontender  Musculoskeletal:  Right lower lumbar musculature and flank with tenderness to palpation, worse with flexion, no rashes-bilateral distal sensation strength and motor function intact although slightly reduced in the left leg(baseline per patient)  Neurological: He is alert and oriented to person, place, and time. Coordination normal.  Psychiatric: He has a normal mood and affect. His behavior is normal. Judgment and thought content normal.  Nursing note and vitals reviewed.    ED Treatments / Results  Labs (all labs  ordered are listed, but only abnormal results are displayed) Labs Reviewed  BASIC METABOLIC PANEL - Abnormal; Notable for the following components:      Result Value   Potassium 3.3 (*)    Glucose, Bld 143 (*)    All other components within normal limits  CBC WITH DIFFERENTIAL/PLATELET  URINALYSIS, ROUTINE W REFLEX MICROSCOPIC    EKG None  Radiology Ct Renal [place]k Study  Result Date: 08/24/2018 CLINICAL DATA:  RIGHT flank pain for 2 days. History of kidney stones and spine surgery. EXAM: CT ABDOMEN AND PELVIS WITHOUT CONTRAST TECHNIQUE: Multidetector CT imaging of the abdomen and pelvis was performed following the standard protocol without IV contrast. COMPARISON:  None. FINDINGS: LOWER CHEST: Lung bases are clear. The visualized heart size is normal. No pericardial effusion. HEPATOBILIARY: Normal. PANCREAS: Normal. SPLEEN: Normal. ADRENALS/URINARY TRACT: Kidneys are orthotopic, demonstrating normal size and morphology. No nephrolithiasis, hydronephrosis; limited assessment for renal masses by nonenhanced CT. Stable 18 mm RIGHT upper pole cyst. Partially duplicated RIGHT renal collecting system. Urinary bladder is partially distended and unremarkable. Normal adrenal glands. STOMACH/BOWEL: The  stomach, small and large bowel are normal in course and caliber without inflammatory changes, sensitivity decreased by lack of enteric contrast. Mild descending colonic diverticulosis. Large cecal diverticulum. Normal appendix. VASCULAR/LYMPHATIC: Aortoiliac vessels are normal in course and caliber. No lymphadenopathy by CT size criteria. REPRODUCTIVE: Normal. OTHER: No intraperitoneal free fluid or free air. Small fat containing RIGHT inguinal hernia. Small fat containing umbilical and infraumbilical ventral hernias. MUSCULOSKELETAL: Non-acute. Large RIGHT os acetabulum. Status post L3 through L5 PLIF/ALIF with arthrodesis. Severe L2-3 degenerative disc compatible with adjacent segment disease. IMPRESSION: 1. No nephrolithiasis or obstructive uropathy; partially duplicated RIGHT renal collecting system. 2. No acute intra-abdominal/pelvic process.  Normal appendix. Electronically Signed   By: Awilda Metroourtnay  Bloomer M.D.   On: 08/24/2018 15:00    Procedures Procedures (including critical care time)  Medications Ordered in ED Medications  ketorolac (TORADOL) 30 MG/ML injection 30 mg (30 mg Intramuscular Given 08/24/18 1258)     Initial Impression / Assessment and Plan / ED Course  I have reviewed the triage vital signs and the nursing notes.  Pertinent labs & imaging results that were available during my care of the patient were reviewed by me and considered in my medical decision making (see chart for details).     Labs:   Imaging:  Consults:  Therapeutics:  Discharge Meds:   Assessment/Plan: 47 year old male presents today with right-sided flank pain.  I have high suspicion for muscular etiology.  Patient concern for kidney [place], CT renal reassuring labs reassuring.  Likely muscular in nature, discharged with symptomatic care and strict return precautions.  He verbalized understanding and agreement to today's plan.  He has no change in distal vascular status.    Final Clinical  Impressions(s) / ED Diagnoses   Final diagnoses:  Flank pain    ED Discharge Orders    None       Rosalio LoudHedges, Kymari Lollis, PA-C 08/24/18 1556    Benjiman CorePickering, Nathan, MD 08/24/18 1851

## 2018-08-27 DIAGNOSIS — R0600 Dyspnea, unspecified: Secondary | ICD-10-CM | POA: Diagnosis not present

## 2018-08-27 DIAGNOSIS — R51 Headache: Secondary | ICD-10-CM | POA: Diagnosis not present

## 2018-08-27 DIAGNOSIS — E669 Obesity, unspecified: Secondary | ICD-10-CM | POA: Diagnosis not present

## 2018-08-27 DIAGNOSIS — G4733 Obstructive sleep apnea (adult) (pediatric): Secondary | ICD-10-CM | POA: Diagnosis not present

## 2018-08-29 DIAGNOSIS — M545 Low back pain: Secondary | ICD-10-CM | POA: Diagnosis not present

## 2018-09-01 ENCOUNTER — Other Ambulatory Visit: Payer: Self-pay | Admitting: Neurological Surgery

## 2018-09-01 DIAGNOSIS — M545 Low back pain, unspecified: Secondary | ICD-10-CM

## 2018-09-01 DIAGNOSIS — G8929 Other chronic pain: Secondary | ICD-10-CM

## 2018-09-02 ENCOUNTER — Telehealth: Payer: Self-pay | Admitting: Nurse Practitioner

## 2018-09-02 NOTE — Telephone Encounter (Signed)
Phone call to patient to verify medication list and allergies for myelogram procedure. Pt instructed to hold Celexa for 48hrs prior to myelogram appointment time. Pt verbalized understanding.

## 2018-09-12 DIAGNOSIS — M545 Low back pain: Secondary | ICD-10-CM | POA: Diagnosis not present

## 2018-09-12 DIAGNOSIS — M5416 Radiculopathy, lumbar region: Secondary | ICD-10-CM | POA: Diagnosis not present

## 2018-09-19 ENCOUNTER — Ambulatory Visit
Admission: RE | Admit: 2018-09-19 | Discharge: 2018-09-19 | Disposition: A | Discharge status: Home / Self Care | Payer: Medicare HMO | Source: Ambulatory Visit | Attending: Neurological Surgery | Admitting: Neurological Surgery

## 2018-09-19 DIAGNOSIS — G8929 Other chronic pain: Secondary | ICD-10-CM

## 2018-09-19 DIAGNOSIS — M48061 Spinal stenosis, lumbar region without neurogenic claudication: Secondary | ICD-10-CM | POA: Diagnosis not present

## 2018-09-19 DIAGNOSIS — M545 Low back pain, unspecified: Secondary | ICD-10-CM

## 2018-09-19 MED ORDER — ONDANSETRON HCL 4 MG/2ML IJ SOLN
4.0000 mg | Freq: Once | INTRAMUSCULAR | Status: AC
  Administered 2018-09-19: 4 mg via INTRAMUSCULAR

## 2018-09-19 MED ORDER — MEPERIDINE HCL 100 MG/ML IJ SOLN
75.0000 mg | Freq: Once | INTRAMUSCULAR | Status: AC
  Administered 2018-09-19: 75 mg via INTRAMUSCULAR

## 2018-09-19 MED ORDER — DIAZEPAM 5 MG PO TABS
10.0000 mg | ORAL_TABLET | Freq: Once | ORAL | Status: AC
  Administered 2018-09-19: 10 mg via ORAL

## 2018-09-19 MED ORDER — IOPAMIDOL (ISOVUE-M 200) INJECTION 41%
15.0000 mL | Freq: Once | INTRAMUSCULAR | Status: AC
  Administered 2018-09-19: 15 mL via EPIDURAL

## 2018-09-19 MED ORDER — ONDANSETRON HCL 4 MG/2ML IJ SOLN: 4.0000 mg | Freq: Four times a day (QID) | INTRAMUSCULAR | Status: DC | PRN

## 2018-09-19 NOTE — Discharge Instructions (Signed)
Myelogram Discharge Instructions  1. Go home and rest quietly for the next 24 hours.  It is important to lie flat for the next 24 hours.  Get up only to go to the restroom.  You may lie in the bed or on a couch on your back, your stomach, your left side or your right side.  You may have one pillow under your head.  You may have pillows between your knees while you are on your side or under your knees while you are on your back.  2. DO NOT drive today.  Recline the seat as far back as it will go, while still wearing your seat belt, on the way home.  3. You may get up to go to the bathroom as needed.  You may sit up for 10 minutes to eat.  You may resume your normal diet and medications unless otherwise indicated.  Drink lots of extra fluids today and tomorrow.  4. The incidence of headache, nausea, or vomiting is about 5% (one in 20 patients).  If you develop a headache, lie flat and drink plenty of fluids until the headache goes away.  Caffeinated beverages may be helpful.  If you develop severe nausea and vomiting or a headache that does not go away with flat bed rest, call [phone].  5. You may resume normal activities after your 24 hours of bed rest is over; however, do not exert yourself strongly or do any heavy lifting tomorrow. If when you get up you have a headache when standing, go back to bed and force fluids for another 24 hours.  6. Call your physician for a follow-up appointment.  The results of your myelogram will be sent directly to your physician by the following day.  7. If you have any questions or if complications develop after you arrive home, please call [phone].  Discharge instructions have been explained to the patient.  The patient, or the person responsible for the patient, fully understands these instructions.  YOU MAY RESTART YOUR CELEXA AND ELAVIL TOMORROW 09/20/2018 AT 09:30AM.

## 2018-09-21 ENCOUNTER — Other Ambulatory Visit: Payer: Self-pay | Admitting: Family Medicine

## 2018-09-27 ENCOUNTER — Other Ambulatory Visit: Payer: Self-pay | Admitting: Neurological Surgery

## 2018-09-27 DIAGNOSIS — Z6836 Body mass index (BMI) 36.0-36.9, adult: Secondary | ICD-10-CM | POA: Diagnosis not present

## 2018-09-27 DIAGNOSIS — I10 Essential (primary) hypertension: Secondary | ICD-10-CM | POA: Diagnosis not present

## 2018-09-27 DIAGNOSIS — M4317 Spondylolisthesis, lumbosacral region: Secondary | ICD-10-CM | POA: Diagnosis not present

## 2018-10-05 NOTE — Progress Notes (Signed)
HPI:  Using dictation device. Unfortunately this device frequently misinterprets words/phrases.  Gary Clark is a pleasant 48 yo unfortunately with a complicated PMH significant for obesity, HTN, hyperglycemia, HLD, depression, sleep apnea and chronic back pain. He has not been in for a follow up visit in > 1 year! Has been dealing with back issues. Sees [facility], orthopedic specialist and pain management.  Reports he will be getting another surgery on his back. He had bmp and cbc last month. Has not had lipid check or hgba1c in some time. Looks like per refills he has not been taking his hctz, lisinopril or crestor on a regular basis.  Reports he has been taking his cholesterol blood pressure medications every other day to space them out.  He did request refills on celexa recently.  Reports he is taking his Celexa every day. Denies cp, sob, doe. Due for flu vaccine, lipids, hgba1c, phq9  ROS: See pertinent positives and negatives per HPI.  Past Medical History:  Diagnosis Date  . Allergy   . Anxiety   . Arthritis   . Asthma   . Back pain   . Blood in stool   . Cluster headache   . Depression   . Headache(784.0)    frequent   . Hyperlipidemia   . Hypertension   . Lumbosacral spondylosis without myelopathy 06/15/2013  . Migraines   . Postlaminectomy syndrome, lumbar region 03/08/2015   Status post L4-L5 decompression and fusion, anterior/posterior in 2011   . Pre-diabetes    pre diabetic per his PCP  . Sleep apnea    tested in 2014 positive   . Stroke [facility](HCC)    pt states "mini-Stroke"  . UTI (urinary tract infection)     Past Surgical History:  Procedure Laterality Date  . ANTERIOR AND POSTERIOR SPINAL FUSION  2011  . ANTERIOR LAT LUMBAR FUSION N/A 10/01/2016   Procedure: XLIF L3-4 ANTERIOR LATERAL LUMBAR FUSION 1 LEVEL;  Surgeon: Venita Lickahari Brooks, MD;  Location: [facility] OR;  Service: Orthopedics;  Laterality: N/A;  . BACK SURGERY    . HARDWARE REMOVAL N/A 10/01/2016   Procedure:  Removal of pedical screws L4-5 HARDWARE REMOVAL;  Surgeon: Venita Lickahari Brooks, MD;  Location: [facility] OR;  Service: Orthopedics;  Laterality: N/A;  . LUMBAR LAMINECTOMY/DECOMPRESSION MICRODISCECTOMY  2010    Family History  Problem Relation Age of Onset  . Heart disease Mother   . Hypertension Mother   . Arthritis Father   . Hypertension Father   . Prostate cancer Father   . Miscarriages / Stillbirths Maternal Grandmother   . Heart disease Maternal Grandfather   . Sudden death Maternal Grandfather   . Breast cancer Sister   . Migraines Sister     SOCIAL HX: see hpi   Current Outpatient Medications:  .  citalopram (CELEXA) 40 MG tablet, TAKE 1 TABLET BY MOUTH ONCE DAILY (PATIENT  NEEDS  APPOINTMENT), Disp: 30 tablet, Rfl: 0 .  hydrochlorothiazide (HYDRODIURIL) 25 MG tablet, Take 1 tablet (25 mg total) by mouth daily., Disp: 90 tablet, Rfl: 3 .  HYDROcodone-acetaminophen (NORCO) 7.5-325 MG tablet, Take 1 tablet by mouth every 6 (six) hours as needed for moderate pain., Disp: 20 tablet, Rfl: 0 .  ibuprofen (ADVIL,MOTRIN) 800 MG tablet, Take 1 tablet (800 mg total) by mouth every 8 (eight) hours as needed for moderate pain., Disp: 90 tablet, Rfl: 0 .  ipratropium (ATROVENT) 0.06 % nasal spray, Place 2 sprays into both nostrils 4 (four) times daily., Disp: 15 mL, Rfl: 0 .  lisinopril (PRINIVIL,ZESTRIL) 20 MG tablet, Take 1.5 tablets (30 mg total) by mouth daily. (Patient taking differently: Take 20 mg by mouth daily. ), Disp: 135 tablet, Rfl: 3 .  rosuvastatin (CRESTOR) 40 MG tablet, Take 1 tablet (40 mg total) by mouth daily., Disp: 90 tablet, Rfl: 3 .  sildenafil (REVATIO) 20 MG tablet, Take 2-5 tablets (40-100mg ) as needed prior to sexual activity. Use lowest effective dose. Do not take more then one dose in 24 hours., Disp: 50 tablet, Rfl: 0 .  tiZANidine (ZANAFLEX) 4 MG tablet, Take 1 tablet (4 mg total) by mouth every 6 (six) hours as needed for muscle spasms., Disp: 30 tablet, Rfl:  0  EXAM:  Vitals:   10/06/18 0822  BP: 122/84  Pulse: 98  Temp: 98.2 F (36.8 C)    Body mass index is 36.69 kg/m.  GENERAL: vitals reviewed and listed above, alert, oriented, appears well hydrated and in no acute distress  HEENT: atraumatic, conjunttiva clear, no obvious abnormalities on inspection of external nose and ears  NECK: no obvious masses on inspection  LUNGS: clear to auscultation bilaterally, no wheezes, rales or rhonchi, good air movement  CV: HRRR, no peripheral edema  MS: moves all extremities without noticeable abnormality  PSYCH: pleasant and cooperative, no obvious depression or anxiety  ASSESSMENT AND PLAN:  Discussed the following assessment and plan:  Morbid obesity (HCC)  Essential hypertension  Hyperlipidemia, unspecified hyperlipidemia type - Plan: HDL cholesterol, Cholesterol, total  Hyperglycemia - Plan: Hemoglobin A1c  Major depressive disorder, recurrent, in remission (HCC)  -lifestyle recs - advised healthy low sugar diet, exercise as  Able and once over back surgery -labs per orders -advised taking medications every day for the cholesterol and blood pressure, refills sent -he feels mood will improve after back surgery, cont tx, follow up if worsening or further help needed -follow up 3-4 months -Patient advised to return or notify a doctor immediately if symptoms worsen or persist or new concerns arise.  Patient Instructions  BEFORE YOU LEAVE: -phq9 in epic -flu shot -labs -follow up: 3-4 months  Healthy low sugar diet and regular exercise.  Take depression, blood pressure and cholesterol medications every day.  We have ordered labs or studies at this visit. It can take up to 1-2 weeks for results and processing. IF results require follow up or explanation, we will call you with instructions. Clinically stable results will be released to your [facility]. If you have not heard from us or cannot find your results in [facility] in 2  weeks please contact our office at [phone].  If you are not yet signed up for [facility], please consider signing up.   We recommend the following healthy lifestyle for LIFE: 1) Small portions. But, make sure to get regular (at least 3 per day), healthy meals and small healthy snacks if needed.  2) Eat a healthy clean diet.   TRY TO EAT: -at least 5-7 servings of low sugar, colorful, and nutrient rich vegetables per day (not corn, potatoes or bananas.) -berries are the best choice if you wish to eat fruit (only eat small amounts if trying to reduce weight)  -lean meets (fish, white meat of chicken or [place]) -vegan proteins for some meals - beans or tofu, whole grains, nuts and seeds -Replace bad fats with good fats - good fats include: fish, nuts and seeds, canola oil, olive oil -small amounts of low fat or non fat dairy -small amounts of100 % whole grains - check the lables -drink  plenty of water  AVOID: -SUGAR, sweets, anything with added sugar, corn syrup or sweeteners - must read labels as even foods advertised as "healthy" often are loaded with sugar -if you must have a sweetener, small amounts of stevia may be best -sweetened beverages and artificially sweetened beverages -simple starches (rice, bread, potatoes, pasta, chips, etc - small amounts of 100% whole grains are ok) -red meat, pork, butter -fried foods, fast food, processed food, excessive dairy, eggs and coconut.  3)Get at least 150 minutes of sweaty aerobic exercise per week.  4)Reduce stress - consider counseling, meditation and relaxation to balance other aspects of your life.         Terressa Koyanagi, DO

## 2018-10-06 ENCOUNTER — Ambulatory Visit (INDEPENDENT_AMBULATORY_CARE_PROVIDER_SITE_OTHER): Payer: Medicare HMO | Admitting: Family Medicine

## 2018-10-06 ENCOUNTER — Encounter: Payer: Self-pay | Admitting: Family Medicine

## 2018-10-06 DIAGNOSIS — E785 Hyperlipidemia, unspecified: Secondary | ICD-10-CM | POA: Diagnosis not present

## 2018-10-06 DIAGNOSIS — F3340 Major depressive disorder, recurrent, in remission, unspecified: Secondary | ICD-10-CM

## 2018-10-06 DIAGNOSIS — R739 Hyperglycemia, unspecified: Secondary | ICD-10-CM

## 2018-10-06 DIAGNOSIS — I10 Essential (primary) hypertension: Secondary | ICD-10-CM | POA: Diagnosis not present

## 2018-10-06 DIAGNOSIS — Z23 Encounter for immunization: Secondary | ICD-10-CM | POA: Diagnosis not present

## 2018-10-06 LAB — CHOLESTEROL, TOTAL: Cholesterol: 278 mg/dL — ABNORMAL HIGH (ref 0–200)

## 2018-10-06 LAB — HDL CHOLESTEROL: HDL: 47.1 mg/dL (ref >39.00)

## 2018-10-06 LAB — HEMOGLOBIN A1C: HEMOGLOBIN A1C: 6 % (ref 4.6–6.5)

## 2018-10-06 NOTE — Patient Instructions (Signed)
BEFORE YOU LEAVE: -phq9 in epic -flu shot -labs -follow up: 3-4 months  Healthy low sugar diet and regular exercise.  Take depression, blood pressure and cholesterol medications every day.  We have ordered labs or studies at this visit. It can take up to 1-2 weeks for results and processing. IF results require follow up or explanation, we will call you with instructions. Clinically stable results will be released to your [facility]. If you have not heard from [place] or cannot find your results in [facility] in 2 weeks please contact our office at [phone].  If you are not yet signed up for [facility], please consider signing up.   We recommend the following healthy lifestyle for LIFE: 1) Small portions. But, make sure to get regular (at least 3 per day), healthy meals and small healthy snacks if needed.  2) Eat a healthy clean diet.   TRY TO EAT: -at least 5-7 servings of low sugar, colorful, and nutrient rich vegetables per day (not corn, potatoes or bananas.) -berries are the best choice if you wish to eat fruit (only eat small amounts if trying to reduce weight)  -lean meets (fish, white meat of chicken or [place]) -vegan proteins for some meals - beans or tofu, whole grains, nuts and seeds -Replace bad fats with good fats - good fats include: fish, nuts and seeds, canola oil, olive oil -small amounts of low fat or non fat dairy -small amounts of100 % whole grains - check the lables -drink plenty of water  AVOID: -SUGAR, sweets, anything with added sugar, corn syrup or sweeteners - must read labels as even foods advertised as "healthy" often are loaded with sugar -if you must have a sweetener, small amounts of stevia may be best -sweetened beverages and artificially sweetened beverages -simple starches (rice, bread, potatoes, pasta, chips, etc - small amounts of 100% whole grains are ok) -red meat, pork, butter -fried foods, fast food, processed food, excessive dairy, eggs and  coconut.  3)Get at least 150 minutes of sweaty aerobic exercise per week.  4)Reduce stress - consider counseling, meditation and relaxation to balance other aspects of your life.

## 2018-10-06 NOTE — Addendum Note (Signed)
Addended by: Johnella Moloney on: 10/06/2018 09:47 AM   Modules accepted: Orders

## 2018-10-11 ENCOUNTER — Telehealth: Payer: Self-pay | Admitting: Family Medicine

## 2018-10-11 MED ORDER — ROSUVASTATIN CALCIUM 40 MG PO TABS: 40.0000 mg | ORAL_TABLET | Freq: Every day | ORAL | 3 refills | Status: DC

## 2018-10-11 NOTE — Addendum Note (Signed)
Addended by: Johnella Moloney on: 10/11/2018 02:21 PM   Modules accepted: Orders

## 2018-10-11 NOTE — Telephone Encounter (Signed)
Lab results given and documented in result note 

## 2018-10-11 NOTE — Telephone Encounter (Unsigned)
Copied from CRM [id number]. Topic: Quick Communication - Lab Results (Clinic Use ONLY) >> Oct 11, 2018  2:22 PM Johnella Moloney, CMA wrote: Called patient to inform them of lab results. When patient returns call, triage nurse may disclose results. >> Oct 11, 2018  2:31 PM Zada Girt, Lumin L wrote: NOD unavailable for results

## 2018-10-13 ENCOUNTER — Other Ambulatory Visit: Payer: Self-pay | Admitting: Family Medicine

## 2018-10-17 ENCOUNTER — Other Ambulatory Visit: Payer: Medicare HMO

## 2018-10-25 NOTE — Pre-Procedure Instructions (Addendum)
Gary Clark  10/25/2018     Your procedure is scheduled on Friday, February 21..  [facility]               Your surgery or procedure is scheduled for 7:30 AM   Call this number if you have problems the morning of surgery: [phone]  This is the number for the Pre- Surgical Desk.               For any other questions, please call [phone], Monday - Friday 8 AM - 4 PM.   Remember:  Do not eat or drink after midnight Thursday, February 20.    Take these medicines the morning of surgery with A SIP OF WATER : citalopram (CELEXA)  rosuvastatin (CRESTOR)   TAKE IF NEEDED: acetaminophen (TYLENOL)  1 Week prior to surgery STOP taking Aspirin, Aspirin Products (Goody Powder, Excedrin Migraine), Ibuprofen (Advil), Naproxen (Aleve), Vitamins and Herbal Products (ie Fish Oil). Special instructions:   [place]- Preparing For Surgery  Before surgery, you can play an important role. Because skin is not sterile, your skin needs to be as free of germs as possible. You can reduce the number of germs on your skin by washing with CHG (chlorahexidine gluconate) Soap before surgery.  CHG is an antiseptic cleaner which kills germs and bonds with the skin to continue killing germs even after washing.    Oral Hygiene is also important to reduce your risk of infection.  Remember - BRUSH YOUR TEETH THE MORNING OF SURGERY WITH YOUR REGULAR TOOTHPASTE  Please do not use if you have an allergy to CHG or antibacterial soaps. If your skin becomes reddened/irritated stop using the CHG.  Do not shave (including legs and underarms) for at least 48 hours prior to first CHG shower. It is OK to shave your face.  Please follow these instructions carefully.   1. Shower the NIGHT BEFORE SURGERY and the MORNING OF SURGERY with CHG.   2. If you chose to wash your hair, wash your hair first as usual with your normal shampoo.  3. After you shampoo,wash your face and  private area with the soap you use at home, then rinse your hair and body thoroughly to remove the shampoo and soap.   4. Use CHG as you would any other liquid soap. You can apply CHG directly to the skin and wash gently with a scrungie or a clean washcloth.   Apply the CHG Soap to your body ONLY FROM THE NECK DOWN.  Do not use on open wounds or open sores. Avoid contact with your eyes, ears, mouth and genitals (private parts).  5. Wash thoroughly, paying special attention to the area where your surgery will be performed.  6. Thoroughly rinse your body with warm water from the neck down.  7. DO NOT shower/wash with your normal soap after using and rinsing off the CHG Soap.  8. Pat yourself dry with a CLEAN TOWEL.  9. Wear CLEAN PAJAMAS to bed the night before surgery, wear comfortable clothes the morning of surgery  10. Place CLEAN SHEETS on your bed the night of your first shower and DO NOT SLEEP WITH PETS. Day of Surgery: Shower as written above                Do not wear lotions, powders, or perfumes, or deodorant. Please wear clean clothes to the hospital/surgery center.   Remember to brush your  teeth WITH YOUR REGULAR TOOTHPASTE.             Do not wear jewelry, make-up or nail polish.  Do not shave 48 hours prior to surgery.  Men may shave face and neck.  Do not bring valuables to the hospital.  [facility] is not responsible for any belongings or valuables.  Contacts, dentures or bridgework may not be worn into surgery.  Leave your suitcase in the car.  After surgery it may be brought to your room.  For patients admitted to the hospital, discharge time will be determined by your treatment team.  Patients discharged the day of surgery will not be allowed to drive home.   Please read over the following fact sheets that you were given. Pain Booklet, Patient Instructions for Mupirocin Application, Coughing and Deep Breathing, Surgical Site Infections.

## 2018-10-26 NOTE — Pre-Procedure Instructions (Signed)
Gary Clark  10/26/2018      [facility] [place], [place] - [address] [place] [address] Phone: [phone] Fax: [phone]  [facility] - [place], [place] - [address] [place] [address] Phone: [phone] Fax: [phone]  [facility] - [place], [place] - [address] [place] [address] Phone: [phone] Fax: [phone]    Your procedure is scheduled on Friday February 21st.  Report to [facility] Admitting at 5:30 A.M.  Call this number if you have problems the morning of surgery:  [phone]   Remember:  Do not eat or drink after midnight.    Take these medicines the morning of surgery with A SIP OF WATER  acetaminophen (TYLENOL)- if needed citalopram (CELEXA) rosuvastatin (CRESTOR)    7 days prior to surgery STOP taking any Aspirin(unless otherwise instructed by your surgeon), Aleve, Naproxen, Ibuprofen, Motrin, Advil, Goody's, BC's, all herbal medications, fish oil, and all vitamins     Do not wear jewelry.  Do not wear lotions, powders, or colognes, or deodorant.  Men may shave face and neck.  Do not bring valuables to the hospital.  [facility] is not responsible for any belongings or valuables.  Contacts, dentures or bridgework may not be worn into surgery.  Leave your suitcase in the car.  After surgery it may be brought to your room.  For patients admitted to the hospital, discharge time will be determined by your treatment team.  Patients discharged the day of surgery will not be allowed to drive home.   [place]- Preparing For Surgery  Before surgery, you can play an important role. Because skin is not sterile, your skin needs to be as free of germs as possible. You can reduce the number of germs on your skin by washing with CHG (chlorahexidine gluconate) Soap before surgery.  CHG is an  antiseptic cleaner which kills germs and bonds with the skin to continue killing germs even after washing.    Oral Hygiene is also important to reduce your risk of infection.  Remember - BRUSH YOUR TEETH THE MORNING OF SURGERY WITH YOUR REGULAR TOOTHPASTE  Please do not use if you have an allergy to CHG or antibacterial soaps. If your skin becomes reddened/irritated stop using the CHG.  Do not shave (including legs and underarms) for at least 48 hours prior to first CHG shower. It is OK to shave your face.  Please follow these instructions carefully.   1. Shower the NIGHT BEFORE SURGERY and the MORNING OF SURGERY with CHG.   2. If you chose to wash your hair, wash your hair first as usual with your normal shampoo.  3. After you shampoo, rinse your hair and body thoroughly to remove the shampoo.  4. Use CHG as you would any other liquid soap. You can apply CHG directly to the skin and wash gently with a scrungie or a clean washcloth.   5. Apply the CHG Soap to your body ONLY FROM THE NECK DOWN.  Do not use on open wounds or open sores. Avoid contact with your eyes, ears, mouth and genitals (private parts). Wash Face and genitals (private parts)  with your normal soap.  6. Wash thoroughly, paying special attention to the area where your surgery will be performed.  7. Thoroughly rinse your body with warm water from the neck down.  8.  DO NOT shower/wash with your normal soap after using and rinsing off the CHG Soap.  9. Pat yourself dry with a CLEAN TOWEL.  10. Wear CLEAN PAJAMAS to bed the night before surgery, wear comfortable clothes the morning of surgery  11. Place CLEAN SHEETS on your bed the night of your first shower and DO NOT SLEEP WITH PETS.    Day of Surgery: Shower as stated above. Do not apply any deodorants/lotions.  Please wear clean clothes to the hospital/surgery center.   Remember to brush your teeth WITH YOUR REGULAR TOOTHPASTE.   Please read over the following  fact sheets that you were given.

## 2018-10-27 ENCOUNTER — Ambulatory Visit (HOSPITAL_COMMUNITY)
Admission: RE | Admit: 2018-10-27 | Discharge: 2018-10-27 | Disposition: A | Discharge status: Home / Self Care | Payer: Medicare HMO | Source: Ambulatory Visit | Attending: Neurological Surgery | Admitting: Neurological Surgery

## 2018-10-27 ENCOUNTER — Encounter (HOSPITAL_COMMUNITY): Payer: Self-pay

## 2018-10-27 ENCOUNTER — Encounter (HOSPITAL_COMMUNITY)
Admission: RE | Admit: 2018-10-27 | Discharge: 2018-10-27 | Disposition: A | Discharge status: Home / Self Care | Payer: Medicare HMO | Source: Ambulatory Visit | Attending: Neurological Surgery | Admitting: Neurological Surgery

## 2018-10-27 ENCOUNTER — Other Ambulatory Visit: Payer: Self-pay

## 2018-10-27 DIAGNOSIS — Z01811 Encounter for preprocedural respiratory examination: Secondary | ICD-10-CM | POA: Diagnosis not present

## 2018-10-27 DIAGNOSIS — M4310 Spondylolisthesis, site unspecified: Secondary | ICD-10-CM | POA: Insufficient documentation

## 2018-10-27 DIAGNOSIS — Z01818 Encounter for other preprocedural examination: Secondary | ICD-10-CM | POA: Diagnosis not present

## 2018-10-27 DIAGNOSIS — M4326 Fusion of spine, lumbar region: Secondary | ICD-10-CM | POA: Diagnosis not present

## 2018-10-27 HISTORY — DX: Personal history of urinary calculi: Z87.442

## 2018-10-27 LAB — CBC WITH DIFFERENTIAL/PLATELET
Abs Immature Granulocytes: 0.03 10*3/uL (ref 0.00–0.07)
Basophils Absolute: 0 10*3/uL (ref 0.0–0.1)
Basophils Relative: 1 %
EOS PCT: 4 %
Eosinophils Absolute: 0.3 10*3/uL (ref 0.0–0.5)
HCT: 45.5 % (ref 39.0–52.0)
Hemoglobin: 15.7 g/dL (ref 13.0–17.0)
Immature Granulocytes: 0 %
Lymphocytes Relative: 31 %
Lymphs Abs: 2.2 10*3/uL (ref 0.7–4.0)
MCH: 33.2 pg (ref 26.0–34.0)
MCHC: 34.5 g/dL (ref 30.0–36.0)
MCV: 96.2 fL (ref 80.0–100.0)
MONO ABS: 0.5 10*3/uL (ref 0.1–1.0)
MONOS PCT: 7 %
Neutro Abs: 4.1 10*3/uL (ref 1.7–7.7)
Neutrophils Relative %: 57 %
Platelets: 317 10*3/uL (ref 150–400)
RBC: 4.73 MIL/uL (ref 4.22–5.81)
RDW: 11.8 % (ref 11.5–15.5)
WBC: 7.1 10*3/uL (ref 4.0–10.5)
nRBC: 0 % (ref 0.0–0.2)

## 2018-10-27 LAB — TYPE AND SCREEN
ABO/RH(D): O POS
Antibody Screen: NEGATIVE

## 2018-10-27 LAB — BASIC METABOLIC PANEL
Anion gap: 13 (ref 5–15)
BUN: 17 mg/dL (ref 6–20)
CO2: 25 mmol/L (ref 22–32)
Calcium: 9.8 mg/dL (ref 8.9–10.3)
Chloride: 103 mmol/L (ref 98–111)
Creatinine, Ser: 1.04 mg/dL (ref 0.61–1.24)
GFR calc Af Amer: >60 mL/min (ref >60)
GFR calc non Af Amer: >60 mL/min (ref >60)
Glucose, Bld: 120 mg/dL — ABNORMAL HIGH (ref 70–99)
Potassium: 4.3 mmol/L (ref 3.5–5.1)
Sodium: 141 mmol/L (ref 135–145)

## 2018-10-27 LAB — SURGICAL PCR SCREEN
MRSA, PCR: NEGATIVE
Staphylococcus aureus: POSITIVE — AB

## 2018-10-27 LAB — PROTIME-INR
INR: 0.9
Prothrombin Time: 12.1 seconds (ref 11.4–15.2)

## 2018-10-27 NOTE — Progress Notes (Signed)
PCR Positive for staph. I called a prescription for Mupirocin ointment to [facility].

## 2018-10-27 NOTE — Progress Notes (Signed)
PCP - Dr Kriste Basque Cardiologist - no  Chest x-ray - 2/13 EKG - 10/27/2018  Stress Test - 2017  ECHO - 2009  Cardiac Cath - no   Sleep Study - ?   CPAP - Yes, does not use, scares son with  Ceberal Palsy  LABS-   ASA-NA  HA1C- 10/06/2018   6.0  Pre diabetes Fasting Blood Sugar - no Checks Blood Sugar _____ times a day no Anesthesia- Blood pressure elevate on arrival and after sitting for 30 minutes.  Mr Bova reports tnhat he walked from the parking deck and was in a lot of pain.  PCP 's note 10/06/2018 states patient does not always  Take blood pressure medications. Blood pressure at PCP 's office was .  I spoke wiht patient and stressed taking blood pressure medications everyday until the morning of surgery- do not take that morning.  I educated patient about drinking water and limiting salt.  Pt denies having chest pain, sob, or fever at this time. All instructions explained to the pt, with a verbal understanding of the material. Pt agrees to go over the instructions while at home for a better understanding. The opportunity to ask questions was provided.

## 2018-10-27 NOTE — Anesthesia Preprocedure Evaluation (Addendum)
Anesthesia Evaluation  Patient identified by MRN, date of birth, ID band Patient awake    Reviewed: Allergy & Precautions, NPO status , Patient's Chart, lab work & pertinent test results  History of Anesthesia Complications Negative for: history of anesthetic complications  Airway Mallampati: II  TM Distance: >3 FB Neck ROM: Full    Dental  (+) Teeth Intact, Dental Advisory Given   Pulmonary asthma , sleep apnea (noncompliant with CPAP) ,    Pulmonary exam normal breath sounds clear to auscultation       Cardiovascular hypertension, Pt. on medications Normal cardiovascular exam Rhythm:Regular Rate:Normal  Negative stress EKG 2017   Neuro/Psych  Headaches, Anxiety Depression Lumbar radiculopathy CVA (2008), No Residual Symptoms    GI/Hepatic negative GI ROS, Neg liver ROS,   Endo/Other  negative endocrine ROS  Renal/GU negative Renal ROS     Musculoskeletal  (+) Arthritis ,   Abdominal   Peds  Hematology negative hematology ROS (+)   Anesthesia Other Findings Day of surgery medications reviewed with the patient.  Reproductive/Obstetrics                           Anesthesia Physical Anesthesia Plan  ASA: II  Anesthesia Plan: General   Post-op Pain Management:    Induction: Intravenous  PONV Risk Score and Plan: 2 and Treatment may vary due to age or medical condition, Ondansetron, Dexamethasone and Midazolam  Airway Management Planned: Oral ETT  Additional Equipment:   Intra-op Plan:   Post-operative Plan: Extubation in OR  Informed Consent: I have reviewed the patients History and Physical, chart, labs and discussed the procedure including the risks, benefits and alternatives for the proposed anesthesia with the patient or authorized representative who has indicated his/her understanding and acceptance.     Dental advisory given  Plan Discussed with: CRNA  Anesthesia  Plan Comments: (History of poorly controlled HTN. Per PCP note 10/06/18 he was only taking antiHTN meds every other day. He was counseled to take as prescribed. Understands surgery could be cancelled if BP is prohibitively high.   BP 142/95 in preop. Will proceed. Stephannie Peters, MD)     Anesthesia Quick Evaluation

## 2018-11-04 ENCOUNTER — Encounter (HOSPITAL_COMMUNITY): Payer: Self-pay

## 2018-11-04 ENCOUNTER — Encounter (HOSPITAL_COMMUNITY)
Admission: RE | Disposition: A | Discharge status: Home / Self Care | Payer: Self-pay | Source: Home / Self Care | Attending: Neurological Surgery

## 2018-11-04 ENCOUNTER — Inpatient Hospital Stay (HOSPITAL_COMMUNITY): Payer: Medicare HMO

## 2018-11-04 ENCOUNTER — Other Ambulatory Visit: Payer: Self-pay

## 2018-11-04 ENCOUNTER — Inpatient Hospital Stay (HOSPITAL_COMMUNITY): Payer: Medicare HMO | Admitting: Physician Assistant

## 2018-11-04 ENCOUNTER — Inpatient Hospital Stay (HOSPITAL_COMMUNITY): Payer: Medicare HMO | Admitting: Anesthesiology

## 2018-11-04 ENCOUNTER — Inpatient Hospital Stay (HOSPITAL_COMMUNITY)
Admission: RE | Admit: 2018-11-04 | Discharge: 2018-11-05 | DRG: 455 | Disposition: A | Discharge status: Home / Self Care | Payer: Medicare HMO | Attending: Neurological Surgery | Admitting: Neurological Surgery

## 2018-11-04 DIAGNOSIS — M4807 Spinal stenosis, lumbosacral region: Secondary | ICD-10-CM | POA: Diagnosis not present

## 2018-11-04 DIAGNOSIS — Z8249 Family history of ischemic heart disease and other diseases of the circulatory system: Secondary | ICD-10-CM | POA: Diagnosis not present

## 2018-11-04 DIAGNOSIS — E785 Hyperlipidemia, unspecified: Secondary | ICD-10-CM | POA: Diagnosis present

## 2018-11-04 DIAGNOSIS — Z8261 Family history of arthritis: Secondary | ICD-10-CM | POA: Diagnosis not present

## 2018-11-04 DIAGNOSIS — Z885 Allergy status to narcotic agent status: Secondary | ICD-10-CM | POA: Diagnosis not present

## 2018-11-04 DIAGNOSIS — G4730 Sleep apnea, unspecified: Secondary | ICD-10-CM | POA: Diagnosis present

## 2018-11-04 DIAGNOSIS — Z87442 Personal history of urinary calculi: Secondary | ICD-10-CM | POA: Diagnosis not present

## 2018-11-04 DIAGNOSIS — Z981 Arthrodesis status: Secondary | ICD-10-CM

## 2018-11-04 DIAGNOSIS — Z79899 Other long term (current) drug therapy: Secondary | ICD-10-CM | POA: Diagnosis not present

## 2018-11-04 DIAGNOSIS — I10 Essential (primary) hypertension: Secondary | ICD-10-CM | POA: Diagnosis present

## 2018-11-04 DIAGNOSIS — Z419 Encounter for procedure for purposes other than remedying health state, unspecified: Secondary | ICD-10-CM

## 2018-11-04 DIAGNOSIS — M5418 Radiculopathy, sacral and sacrococcygeal region: Secondary | ICD-10-CM | POA: Diagnosis not present

## 2018-11-04 DIAGNOSIS — F329 Major depressive disorder, single episode, unspecified: Secondary | ICD-10-CM | POA: Diagnosis present

## 2018-11-04 DIAGNOSIS — M4317 Spondylolisthesis, lumbosacral region: Secondary | ICD-10-CM | POA: Diagnosis not present

## 2018-11-04 DIAGNOSIS — Z8673 Personal history of transient ischemic attack (TIA), and cerebral infarction without residual deficits: Secondary | ICD-10-CM

## 2018-11-04 DIAGNOSIS — M5416 Radiculopathy, lumbar region: Secondary | ICD-10-CM | POA: Diagnosis not present

## 2018-11-04 SURGERY — POSTERIOR LUMBAR FUSION 1 LEVEL
Anesthesia: General | Site: Spine Lumbar

## 2018-11-04 MED ORDER — LISINOPRIL 20 MG PO TABS
20.0000 mg | ORAL_TABLET | Freq: Every day | ORAL | Status: DC
  Administered 2018-11-04 – 2018-11-05 (×2): 20 mg via ORAL
  Filled 2018-11-04 (×2): qty 1

## 2018-11-04 MED ORDER — THROMBIN 5000 UNITS EX SOLR
CUTANEOUS | Status: AC
  Filled 2018-11-04: qty 5000

## 2018-11-04 MED ORDER — CELECOXIB 200 MG PO CAPS
200.0000 mg | ORAL_CAPSULE | Freq: Two times a day (BID) | ORAL | Status: DC
  Administered 2018-11-04 – 2018-11-05 (×3): 200 mg via ORAL
  Filled 2018-11-04 (×3): qty 1

## 2018-11-04 MED ORDER — SODIUM CHLORIDE 0.9 % IV SOLN
INTRAVENOUS | Status: DC | PRN
  Administered 2018-11-04: 500 mL

## 2018-11-04 MED ORDER — VANCOMYCIN HCL 1000 MG IV SOLR
INTRAVENOUS | Status: AC
  Filled 2018-11-04: qty 1000

## 2018-11-04 MED ORDER — SODIUM CHLORIDE 0.9 % IV SOLN
INTRAVENOUS | Status: DC | PRN
  Administered 2018-11-04: 35 ug/min via INTRAVENOUS

## 2018-11-04 MED ORDER — ACD FORMULA A 0.73-2.45-2.2 GM/100ML VI SOLN
Status: AC | PRN
  Administered 2018-11-04: 10 mL

## 2018-11-04 MED ORDER — 0.9 % SODIUM CHLORIDE (POUR BTL) OPTIME
TOPICAL | Status: DC | PRN
  Administered 2018-11-04: 1000 mL

## 2018-11-04 MED ORDER — MIDAZOLAM HCL 2 MG/2ML IJ SOLN
INTRAMUSCULAR | Status: AC
  Filled 2018-11-04: qty 2

## 2018-11-04 MED ORDER — DEXAMETHASONE SODIUM PHOSPHATE 4 MG/ML IJ SOLN: 4.0000 mg | Freq: Four times a day (QID) | INTRAMUSCULAR | Status: DC

## 2018-11-04 MED ORDER — ACETAMINOPHEN 650 MG RE SUPP: 650.0000 mg | RECTAL | Status: DC | PRN

## 2018-11-04 MED ORDER — BUPIVACAINE HCL (PF) 0.25 % IJ SOLN
INTRAMUSCULAR | Status: AC
  Filled 2018-11-04: qty 30

## 2018-11-04 MED ORDER — ONDANSETRON HCL 4 MG PO TABS: 4.0000 mg | ORAL_TABLET | Freq: Four times a day (QID) | ORAL | Status: DC | PRN

## 2018-11-04 MED ORDER — CHLORHEXIDINE GLUCONATE CLOTH 2 % EX PADS: 6.0000 | MEDICATED_PAD | Freq: Once | CUTANEOUS | Status: DC

## 2018-11-04 MED ORDER — OXYCODONE HCL 5 MG PO TABS
5.0000 mg | ORAL_TABLET | Freq: Once | ORAL | Status: AC | PRN
  Administered 2018-11-04: 5 mg via ORAL

## 2018-11-04 MED ORDER — SODIUM CHLORIDE 0.9% FLUSH
3.0000 mL | Freq: Two times a day (BID) | INTRAVENOUS | Status: DC
  Administered 2018-11-04 (×2): 3 mL via INTRAVENOUS

## 2018-11-04 MED ORDER — HEPARIN SODIUM (PORCINE) 1000 UNIT/ML IJ SOLN
INTRAMUSCULAR | Status: DC | PRN
  Administered 2018-11-04: 5000 [IU] via INTRAVENOUS

## 2018-11-04 MED ORDER — OXYCODONE HCL 5 MG/5ML PO SOLN: 5.0000 mg | Freq: Once | ORAL | Status: AC | PRN

## 2018-11-04 MED ORDER — ROCURONIUM BROMIDE 50 MG/5ML IV SOSY
PREFILLED_SYRINGE | INTRAVENOUS | Status: DC | PRN
  Administered 2018-11-04: 80 mg via INTRAVENOUS
  Administered 2018-11-04: 20 mg via INTRAVENOUS
  Administered 2018-11-04: 15 mg via INTRAVENOUS
  Administered 2018-11-04: 10 mg via INTRAVENOUS

## 2018-11-04 MED ORDER — THROMBIN 20000 UNITS EX SOLR
CUTANEOUS | Status: DC | PRN
  Administered 2018-11-04: 20 mL via TOPICAL

## 2018-11-04 MED ORDER — CEFAZOLIN SODIUM-DEXTROSE 2-4 GM/100ML-% IV SOLN
2.0000 g | INTRAVENOUS | Status: AC
  Administered 2018-11-04: 2 g via INTRAVENOUS
  Filled 2018-11-04: qty 100

## 2018-11-04 MED ORDER — ACETAMINOPHEN 10 MG/ML IV SOLN: 1000.0000 mg | Freq: Once | INTRAVENOUS | Status: DC | PRN

## 2018-11-04 MED ORDER — THROMBIN 20000 UNITS EX SOLR
CUTANEOUS | Status: AC
  Filled 2018-11-04: qty 20000

## 2018-11-04 MED ORDER — THROMBIN 5000 UNITS EX SOLR
OROMUCOSAL | Status: DC | PRN
  Administered 2018-11-04: 5 mL via TOPICAL

## 2018-11-04 MED ORDER — LIDOCAINE 2% (20 MG/ML) 5 ML SYRINGE
INTRAMUSCULAR | Status: DC | PRN
  Administered 2018-11-04: 100 mg via INTRAVENOUS

## 2018-11-04 MED ORDER — POTASSIUM CHLORIDE IN NACL 20-0.9 MEQ/L-% IV SOLN: INTRAVENOUS | Status: DC

## 2018-11-04 MED ORDER — PROPOFOL 10 MG/ML IV BOLUS
INTRAVENOUS | Status: AC
  Filled 2018-11-04: qty 20

## 2018-11-04 MED ORDER — SUGAMMADEX SODIUM 200 MG/2ML IV SOLN
INTRAVENOUS | Status: DC | PRN
  Administered 2018-11-04: 200 mg via INTRAVENOUS

## 2018-11-04 MED ORDER — ONDANSETRON HCL 4 MG/2ML IJ SOLN
INTRAMUSCULAR | Status: DC | PRN
  Administered 2018-11-04: 4 mg via INTRAVENOUS

## 2018-11-04 MED ORDER — BUPIVACAINE HCL (PF) 0.25 % IJ SOLN
INTRAMUSCULAR | Status: DC | PRN
  Administered 2018-11-04: 7 mL

## 2018-11-04 MED ORDER — PROPOFOL 10 MG/ML IV BOLUS
INTRAVENOUS | Status: DC | PRN
  Administered 2018-11-04: 180 mg via INTRAVENOUS

## 2018-11-04 MED ORDER — METHOCARBAMOL 500 MG PO TABS
500.0000 mg | ORAL_TABLET | Freq: Four times a day (QID) | ORAL | Status: DC | PRN
  Administered 2018-11-04 – 2018-11-05 (×4): 500 mg via ORAL
  Filled 2018-11-04 (×3): qty 1

## 2018-11-04 MED ORDER — PHENYLEPHRINE HCL 10 MG/ML IJ SOLN
INTRAMUSCULAR | Status: DC | PRN
  Administered 2018-11-04: 120 ug via INTRAVENOUS

## 2018-11-04 MED ORDER — VANCOMYCIN HCL 1000 MG IV SOLR
INTRAVENOUS | Status: DC | PRN
  Administered 2018-11-04: 1000 mg via TOPICAL

## 2018-11-04 MED ORDER — ACETAMINOPHEN 500 MG PO TABS
ORAL_TABLET | ORAL | Status: AC
  Administered 2018-11-04: 1000 mg
  Filled 2018-11-04: qty 2

## 2018-11-04 MED ORDER — CITALOPRAM HYDROBROMIDE 20 MG PO TABS
40.0000 mg | ORAL_TABLET | Freq: Every day | ORAL | Status: DC
  Administered 2018-11-04 – 2018-11-05 (×2): 40 mg via ORAL
  Filled 2018-11-04 (×2): qty 2

## 2018-11-04 MED ORDER — HYDROCHLOROTHIAZIDE 25 MG PO TABS
25.0000 mg | ORAL_TABLET | Freq: Every day | ORAL | Status: DC
  Administered 2018-11-04 – 2018-11-05 (×2): 25 mg via ORAL
  Filled 2018-11-04 (×2): qty 1

## 2018-11-04 MED ORDER — DEXAMETHASONE SODIUM PHOSPHATE 10 MG/ML IJ SOLN
10.0000 mg | INTRAMUSCULAR | Status: AC
  Administered 2018-11-04: 10 mg via INTRAVENOUS
  Filled 2018-11-04: qty 1

## 2018-11-04 MED ORDER — CEFAZOLIN SODIUM-DEXTROSE 2-4 GM/100ML-% IV SOLN
2.0000 g | Freq: Three times a day (TID) | INTRAVENOUS | Status: AC
  Administered 2018-11-04 (×2): 2 g via INTRAVENOUS
  Filled 2018-11-04 (×2): qty 100

## 2018-11-04 MED ORDER — MENTHOL 3 MG MT LOZG: 1.0000 | LOZENGE | OROMUCOSAL | Status: DC | PRN

## 2018-11-04 MED ORDER — PHENYLEPHRINE 40 MCG/ML (10ML) SYRINGE FOR IV PUSH (FOR BLOOD PRESSURE SUPPORT)
PREFILLED_SYRINGE | INTRAVENOUS | Status: AC
  Filled 2018-11-04: qty 10

## 2018-11-04 MED ORDER — PROMETHAZINE HCL 25 MG/ML IJ SOLN: 6.2500 mg | INTRAMUSCULAR | Status: DC | PRN

## 2018-11-04 MED ORDER — SODIUM CHLORIDE 0.9% FLUSH: 3.0000 mL | INTRAVENOUS | Status: DC | PRN

## 2018-11-04 MED ORDER — OXYCODONE HCL 5 MG PO TABS
ORAL_TABLET | ORAL | Status: AC
  Administered 2018-11-04: 5 mg via ORAL
  Filled 2018-11-04: qty 1

## 2018-11-04 MED ORDER — MIDAZOLAM HCL 5 MG/5ML IJ SOLN
INTRAMUSCULAR | Status: DC | PRN
  Administered 2018-11-04: 2 mg via INTRAVENOUS

## 2018-11-04 MED ORDER — ACETAMINOPHEN 325 MG PO TABS
650.0000 mg | ORAL_TABLET | ORAL | Status: DC | PRN
  Administered 2018-11-04 – 2018-11-05 (×2): 650 mg via ORAL
  Filled 2018-11-04 (×2): qty 2

## 2018-11-04 MED ORDER — LACTATED RINGERS IV SOLN
INTRAVENOUS | Status: DC | PRN
  Administered 2018-11-04 (×2): via INTRAVENOUS

## 2018-11-04 MED ORDER — FENTANYL CITRATE (PF) 250 MCG/5ML IJ SOLN
INTRAMUSCULAR | Status: AC
  Filled 2018-11-04: qty 5

## 2018-11-04 MED ORDER — METHOCARBAMOL 500 MG PO TABS
ORAL_TABLET | ORAL | Status: AC
  Administered 2018-11-04: 500 mg via ORAL
  Filled 2018-11-04: qty 1

## 2018-11-04 MED ORDER — HYDROMORPHONE HCL 1 MG/ML IJ SOLN
INTRAMUSCULAR | Status: AC
  Administered 2018-11-04: 0.5 mg via INTRAVENOUS
  Filled 2018-11-04: qty 1

## 2018-11-04 MED ORDER — SENNA 8.6 MG PO TABS
1.0000 | ORAL_TABLET | Freq: Two times a day (BID) | ORAL | Status: DC
  Administered 2018-11-04 – 2018-11-05 (×2): 8.6 mg via ORAL
  Filled 2018-11-04 (×2): qty 1

## 2018-11-04 MED ORDER — ONDANSETRON HCL 4 MG/2ML IJ SOLN
4.0000 mg | Freq: Four times a day (QID) | INTRAMUSCULAR | Status: DC | PRN
  Administered 2018-11-04: 4 mg via INTRAVENOUS
  Filled 2018-11-04: qty 2

## 2018-11-04 MED ORDER — FENTANYL CITRATE (PF) 100 MCG/2ML IJ SOLN
INTRAMUSCULAR | Status: DC | PRN
  Administered 2018-11-04 (×4): 50 ug via INTRAVENOUS

## 2018-11-04 MED ORDER — PHENOL 1.4 % MT LIQD: 1.0000 | OROMUCOSAL | Status: DC | PRN

## 2018-11-04 MED ORDER — OXYCODONE HCL 5 MG PO TABS
10.0000 mg | ORAL_TABLET | ORAL | Status: DC | PRN
  Administered 2018-11-04 – 2018-11-05 (×5): 10 mg via ORAL
  Filled 2018-11-04 (×5): qty 2

## 2018-11-04 MED ORDER — METHOCARBAMOL 1000 MG/10ML IJ SOLN
500.0000 mg | Freq: Four times a day (QID) | INTRAVENOUS | Status: DC | PRN
  Filled 2018-11-04: qty 5

## 2018-11-04 MED ORDER — HYDROMORPHONE HCL 1 MG/ML IJ SOLN
0.2500 mg | INTRAMUSCULAR | Status: DC | PRN
  Administered 2018-11-04: 0.5 mg via INTRAVENOUS

## 2018-11-04 MED ORDER — ALBUMIN HUMAN 5 % IV SOLN
INTRAVENOUS | Status: DC | PRN
  Administered 2018-11-04 (×2): via INTRAVENOUS

## 2018-11-04 MED ORDER — HEPARIN SODIUM (PORCINE) 1000 UNIT/ML IJ SOLN
INTRAMUSCULAR | Status: AC
  Filled 2018-11-04: qty 1

## 2018-11-04 MED ORDER — SODIUM CHLORIDE 0.9 % IV SOLN: 250.0000 mL | INTRAVENOUS | Status: DC

## 2018-11-04 MED ORDER — ONDANSETRON HCL 4 MG/2ML IJ SOLN
INTRAMUSCULAR | Status: AC
  Filled 2018-11-04: qty 2

## 2018-11-04 MED ORDER — DEXAMETHASONE 4 MG PO TABS
4.0000 mg | ORAL_TABLET | Freq: Four times a day (QID) | ORAL | Status: DC
  Administered 2018-11-04 – 2018-11-05 (×4): 4 mg via ORAL
  Filled 2018-11-04 (×4): qty 1

## 2018-11-04 MED ORDER — MORPHINE SULFATE (PF) 2 MG/ML IV SOLN
2.0000 mg | INTRAVENOUS | Status: DC | PRN
  Administered 2018-11-04: 2 mg via INTRAVENOUS
  Filled 2018-11-04: qty 1

## 2018-11-04 SURGICAL SUPPLY — 64 items
BAG DECANTER FOR FLEXI CONT (MISCELLANEOUS) ×2 IMPLANT
BASKET BONE COLLECTION (BASKET) ×2 IMPLANT
BENZOIN TINCTURE PRP APPL 2/3 (GAUZE/BANDAGES/DRESSINGS) ×2 IMPLANT
BLADE CLIPPER SURG (BLADE) ×2 IMPLANT
BUR MATCHSTICK NEURO 3.0 LAGG (BURR) ×2 IMPLANT
CANISTER SUCT 3000ML PPV (MISCELLANEOUS) ×2 IMPLANT
CARTRIDGE OIL MAESTRO DRILL (MISCELLANEOUS) ×1 IMPLANT
CONT SPEC 4OZ CLIKSEAL STRL BL (MISCELLANEOUS) ×4 IMPLANT
COVER BACK TABLE 60X90IN (DRAPES) ×2 IMPLANT
COVER WAND RF STERILE (DRAPES) IMPLANT
DERMABOND ADVANCED (GAUZE/BANDAGES/DRESSINGS) ×1
DERMABOND ADVANCED .7 DNX12 (GAUZE/BANDAGES/DRESSINGS) ×1 IMPLANT
DIFFUSER DRILL AIR PNEUMATIC (MISCELLANEOUS) ×2 IMPLANT
DRAPE C-ARM 42X72 X-RAY (DRAPES) ×4 IMPLANT
DRAPE LAPAROTOMY 100X72X124 (DRAPES) ×2 IMPLANT
DRAPE POUCH INSTRU U-SHP 10X18 (DRAPES) IMPLANT
DRAPE SURG 17X23 STRL (DRAPES) ×2 IMPLANT
DRSG OPSITE POSTOP 4X6 (GAUZE/BANDAGES/DRESSINGS) ×2 IMPLANT
DURAPREP 26ML APPLICATOR (WOUND CARE) ×2 IMPLANT
ELECT REM PT RETURN 9FT ADLT (ELECTROSURGICAL) ×2
ELECTRODE REM PT RTRN 9FT ADLT (ELECTROSURGICAL) ×1 IMPLANT
EVACUATOR 1/8 PVC DRAIN (DRAIN) IMPLANT
GAUZE 4X4 16PLY RFD (DISPOSABLE) IMPLANT
GLOVE BIO SURGEON STRL SZ7 (GLOVE) ×4 IMPLANT
GLOVE BIO SURGEON STRL SZ8 (GLOVE) ×4 IMPLANT
GLOVE BIOGEL PI IND STRL 7.0 (GLOVE) ×2 IMPLANT
GLOVE BIOGEL PI IND STRL 8 (GLOVE) ×2 IMPLANT
GLOVE BIOGEL PI INDICATOR 7.0 (GLOVE) ×2
GLOVE BIOGEL PI INDICATOR 8 (GLOVE) ×2
GLOVE ECLIPSE 7.5 STRL STRAW (GLOVE) ×6 IMPLANT
GOWN STRL REUS W/ TWL LRG LVL3 (GOWN DISPOSABLE) ×1 IMPLANT
GOWN STRL REUS W/ TWL XL LVL3 (GOWN DISPOSABLE) ×1 IMPLANT
GOWN STRL REUS W/TWL 2XL LVL3 (GOWN DISPOSABLE) ×2 IMPLANT
GOWN STRL REUS W/TWL LRG LVL3 (GOWN DISPOSABLE) ×1
GOWN STRL REUS W/TWL XL LVL3 (GOWN DISPOSABLE) ×1
HEMOSTAT POWDER KIT SURGIFOAM (HEMOSTASIS) ×2 IMPLANT
KIT BASIN OR (CUSTOM PROCEDURE TRAY) ×2 IMPLANT
KIT BONE MARROW PROCESS ANGEL (KITS) ×2 IMPLANT
KIT BONE MRW ASP ANGEL CPRP (KITS) ×2 IMPLANT
KIT TURNOVER KIT B (KITS) ×2 IMPLANT
MILL MEDIUM DISP (BLADE) ×2 IMPLANT
NEEDLE HYPO 25X1 1.5 SAFETY (NEEDLE) ×2 IMPLANT
NS IRRIG 1000ML POUR BTL (IV SOLUTION) ×2 IMPLANT
OIL CARTRIDGE MAESTRO DRILL (MISCELLANEOUS) ×2
PACK LAMINECTOMY NEURO (CUSTOM PROCEDURE TRAY) ×2 IMPLANT
PAD ARMBOARD 7.5X6 YLW CONV (MISCELLANEOUS) ×10 IMPLANT
PUTTY DBM ALLOSYNC PURE 10CC (Putty) ×2 IMPLANT
ROD LORD TI 5.5X35 (Rod) ×4 IMPLANT
SCREW KODIAK 6.5X40 (Screw) ×4 IMPLANT
SCREW KODIAK 6.5X45 (Screw) ×4 IMPLANT
SET SCREW (Screw) ×4 IMPLANT
SET SCREW SPNE (Screw) ×4 IMPLANT
SPACER IDENTITI PS 8X9X25 15D (Spacer) ×4 IMPLANT
SPONGE LAP 4X18 RFD (DISPOSABLE) IMPLANT
SPONGE SURGIFOAM ABS GEL 100 (HEMOSTASIS) IMPLANT
STRIP CLOSURE SKIN 1/2X4 (GAUZE/BANDAGES/DRESSINGS) ×4 IMPLANT
SUT VIC AB 0 CT1 18XCR BRD8 (SUTURE) ×1 IMPLANT
SUT VIC AB 0 CT1 8-18 (SUTURE) ×1
SUT VIC AB 2-0 CP2 18 (SUTURE) ×2 IMPLANT
SUT VIC AB 3-0 SH 8-18 (SUTURE) ×4 IMPLANT
TOWEL GREEN STERILE (TOWEL DISPOSABLE) ×2 IMPLANT
TOWEL GREEN STERILE FF (TOWEL DISPOSABLE) ×2 IMPLANT
TRAY FOLEY MTR SLVR 16FR STAT (SET/KITS/TRAYS/PACK) ×2 IMPLANT
WATER STERILE IRR 1000ML POUR (IV SOLUTION) ×2 IMPLANT

## 2018-11-04 NOTE — Anesthesia Postprocedure Evaluation (Signed)
Anesthesia Post Note  Patient: Gary Clark  Procedure(s) Performed: Posterior Lumbar Interbody fusion Lumbar five-Sacral One - Posterior Lateral and Interbody fusion (N/A Spine Lumbar)     Patient location during evaluation: PACU Anesthesia Type: General Level of consciousness: awake and alert Pain management: pain level controlled Vital Signs Assessment: post-procedure vital signs reviewed and stable Respiratory status: spontaneous breathing, nonlabored ventilation and respiratory function stable Cardiovascular status: blood pressure returned to baseline and stable Postop Assessment: no apparent nausea or vomiting Anesthetic complications: no    Last Vitals:  Vitals:   11/04/18 1200 11/04/18 1245  BP: (!) 144/94 (!) 140/100  Pulse: 97 94  Resp: 14 17  Temp:    SpO2: 99% 96%    Last Pain:  Vitals:   11/04/18 1230  TempSrc:   PainSc: 4                  Kaylyn Layer

## 2018-11-04 NOTE — H&P (Signed)
Subjective: Patient is a [age] y.o. male admitted for plif. Onset of symptoms was several months ago, gradually worsening since that time.  The pain is rated severe, and is located at the across the lower back and radiates to legs. The pain is described as aching and occurs all day. The symptoms have been progressive. Symptoms are exacerbated by exercise. MRI or CT showed L5-S1 spondy   Past Medical History:  Diagnosis Date  . Allergy   . Anxiety   . Arthritis   . Asthma    years ago  . Back pain   . Blood in stool   . Cluster headache   . Depression   . Headache(784.0)    frequent   . History of kidney stones    passed 2 times  . Hyperlipidemia   . Hypertension   . Lumbosacral spondylosis without myelopathy 06/15/2013  . Migraines     10/27/2018- history of. Not current  . Postlaminectomy syndrome, lumbar region 03/08/2015   Status post L4-L5 decompression and fusion, anterior/posterior in 2011   . Pre-diabetes    pre diabetic per his PCP  . Sleep apnea    tested in 2014 positive   . Stroke [facility](HCC) 2008   pt states "mini-Stroke"-   . UTI (urinary tract infection)     Past Surgical History:  Procedure Laterality Date  . ANTERIOR AND POSTERIOR SPINAL FUSION  2011  . ANTERIOR LAT LUMBAR FUSION N/A 10/01/2016   Procedure: XLIF L3-4 ANTERIOR LATERAL LUMBAR FUSION 1 LEVEL;  Surgeon: Venita Lickahari Brooks, MD;  Location: [facility] OR;  Service: Orthopedics;  Laterality: N/A;  . BACK SURGERY    . HARDWARE REMOVAL N/A 10/01/2016   Procedure: Removal of pedical screws L4-5 HARDWARE REMOVAL;  Surgeon: Venita Lickahari Brooks, MD;  Location: [facility] OR;  Service: Orthopedics;  Laterality: N/A;  . LUMBAR LAMINECTOMY/DECOMPRESSION MICRODISCECTOMY  2010    Prior to Admission medications   Medication Sig Start Date End Date Taking? Authorizing Provider  acetaminophen (TYLENOL) 650 MG CR tablet Take 1,300 mg by mouth every 8 (eight) hours as needed for pain.   Yes [provider]  citalopram (CELEXA) 40 MG tablet  TAKE 1 TABLET BY MOUTH ONCE DAILY ( PATIENT NEEDS APPOINTMENT ) Patient taking differently: Take 40 mg by mouth daily.  10/13/18  Yes Kriste BasqueKim, Hannah R, DO  hydrochlorothiazide (HYDRODIURIL) 25 MG tablet Take 1 tablet (25 mg total) by mouth daily. 09/13/17  Yes Kriste BasqueKim, Hannah R, DO  ibuprofen (ADVIL,MOTRIN) 200 MG tablet Take 800 mg by mouth 3 (three) times daily as needed for moderate pain.   Yes [provider]  lisinopril (PRINIVIL,ZESTRIL) 20 MG tablet Take 1.5 tablets (30 mg total) by mouth daily. Patient taking differently: Take 20 mg by mouth daily.  05/22/16  Yes Kriste BasqueKim, Hannah R, DO  rosuvastatin (CRESTOR) 40 MG tablet Take 1 tablet (40 mg total) by mouth daily. 10/11/18  Yes Terressa KoyanagiKim, Hannah R, DO  HYDROcodone-acetaminophen (NORCO) 7.5-325 MG tablet Take 1 tablet by mouth every 6 (six) hours as needed for moderate pain. Patient not taking: Reported on 10/14/2018 07/26/18   Eustace MooreNelson, Yvonne Sue, MD  ibuprofen (ADVIL,MOTRIN) 800 MG tablet Take 1 tablet (800 mg total) by mouth every 8 (eight) hours as needed for moderate pain. Patient not taking: Reported on 10/14/2018 07/26/18   Eustace MooreNelson, Yvonne Sue, MD  ipratropium (ATROVENT) 0.06 % nasal spray Place 2 sprays into both nostrils 4 (four) times daily. Patient not taking: Reported on 10/14/2018 11/19/17   Ardith DarkParker, Caleb M, MD  tiZANidine (ZANAFLEX) 4 MG tablet Take 1 tablet (4 mg total) by mouth every 6 (six) hours as needed for muscle spasms. Patient not taking: Reported on 10/14/2018 07/26/18   Eustace Moore, MD   Allergies  Allergen Reactions  . Codeine Other (See Comments)    Gets "loopy"    Social History   Tobacco Use  . Smoking status: Never Smoker  . Smokeless tobacco: Never Used  Substance Use Topics  . Alcohol use: Yes    Alcohol/week: 10.0 standard drinks    Types: 10 Cans of beer per week    Family History  Problem Relation Age of Onset  . Heart disease Mother   . Hypertension Mother   . Arthritis Father   . Hypertension Father    . Prostate cancer Father   . Miscarriages / Stillbirths Maternal Grandmother   . Heart disease Maternal Grandfather   . Sudden death Maternal Grandfather   . Breast cancer Sister   . Migraines Sister      Review of Systems  Positive ROS: neg  All other systems have been reviewed and were otherwise negative with the exception of those mentioned in the HPI and as above.  Objective: Vital signs in last 24 hours: Temp:  [98.4 F (36.9 C)] 98.4 F (36.9 C) (02/21 0622) Pulse Rate:  [82] 82 (02/21 0622) Resp:  [20] 20 (02/21 0622) BP: (142-148)/(95-102) 142/95 (02/21 0634) SpO2:  [98 %] 98 % (02/21 0622) Weight:  [101.2 kg] 101.2 kg (02/21 0622)  General Appearance: Alert, cooperative, no distress, appears stated age Head: Normocephalic, without obvious abnormality, atraumatic Eyes: PERRL, conjunctiva/corneas clear, EOM's intact    Neck: Supple, symmetrical, trachea midline Back: Symmetric, no curvature, ROM normal, no CVA tenderness Lungs:  respirations unlabored Heart: Regular rate and rhythm Abdomen: Soft, non-tender Extremities: Extremities normal, atraumatic, no cyanosis or edema Pulses: 2+ and symmetric all extremities Skin: Skin color, texture, turgor normal, no rashes or lesions  NEUROLOGIC:   Mental status: Alert and oriented x4,  no aphasia, good attention span, fund of knowledge, and memory Motor Exam - grossly normal Sensory Exam - grossly normal Reflexes: 1+ Coordination - grossly normal Gait - grossly normal Balance - grossly normal Cranial Nerves: I: smell Not tested  II: visual acuity  OS: nl    OD: nl  II: visual fields Full to confrontation  II: pupils Equal, round, reactive to light  III,VII: ptosis None  III,IV,VI: extraocular muscles  Full ROM  V: mastication Normal  V: facial light touch sensation  Normal  V,VII: corneal reflex  Present  VII: facial muscle function - upper  Normal  VII: facial muscle function - lower Normal  VIII: hearing  Not tested  IX: soft palate elevation  Normal  IX,X: gag reflex Present  XI: trapezius strength  5/5  XI: sternocleidomastoid strength 5/5  XI: neck flexion strength  5/5  XII: tongue strength  Normal    Data Review Lab Results  Component Value Date   WBC 7.1 10/27/2018   HGB 15.7 10/27/2018   HCT 45.5 10/27/2018   MCV 96.2 10/27/2018   PLT 317 10/27/2018   Lab Results  Component Value Date   NA 141 10/27/2018   K 4.3 10/27/2018   CL 103 10/27/2018   CO2 25 10/27/2018   BUN 17 10/27/2018   CREATININE 1.04 10/27/2018   GLUCOSE 120 (H) 10/27/2018   Lab Results  Component Value Date   INR 0.90 10/27/2018    Assessment/Plan:  Estimated body  mass index is 35.99 kg/m as calculated from the following:   Height as of this encounter: 5\' 6"  (1.676 m).   Weight as of this encounter: 101.2 kg. Patient admitted for PLIF L5-S1. Patient has failed a reasonable attempt at conservative therapy.  I explained the condition and procedure to the patient and answered any questions.  Patient wishes to proceed with procedure as planned. Understands risks/ benefits and typical outcomes of procedure.   Tia Alert 11/04/2018 7:23 AM

## 2018-11-04 NOTE — Anesthesia Procedure Notes (Signed)
Procedure Name: Intubation Date/Time: 11/04/2018 7:40 AM Performed by: Epifanio Lesches, CRNA Pre-anesthesia Checklist: Patient identified, Emergency Drugs available, Suction available and Patient being monitored Patient Re-evaluated:Patient Re-evaluated prior to induction Oxygen Delivery Method: Circle System Utilized Preoxygenation: Pre-oxygenation with 100% oxygen Induction Type: IV induction Ventilation: Mask ventilation without difficulty and Oral airway inserted - appropriate to patient size Laryngoscope Size: Glidescope and 4 Grade View: Grade I Tube type: Oral Tube size: 7.5 mm Number of attempts: 1 Airway Equipment and Method: Stylet and Oral airway Placement Confirmation: ETT inserted through vocal cords under direct vision,  positive ETCO2 and breath sounds checked- equal and bilateral Secured at: 22 cm Tube secured with: Tape Dental Injury: Teeth and Oropharynx as per pre-operative assessment  Difficulty Due To: Difficulty was anticipated, Difficult Airway- due to large tongue and Difficult Airway- due to anterior larynx

## 2018-11-04 NOTE — Progress Notes (Signed)
Orthopedic Tech Progress Note Patient Details:  Gary Clark 1971/07/01 [id number] Placed order with [employer] Patient ID: [name], male   DOB: 02-08-1971, 48 y.o.   MRN: [id number]   [name] 11/04/2018, 2:27 PM

## 2018-11-04 NOTE — Transfer of Care (Signed)
Immediate Anesthesia Transfer of Care Note  Patient: Gary Clark  Procedure(s) Performed: Posterior Lumbar Interbody fusion Lumbar five-Sacral One - Posterior Lateral and Interbody fusion (N/A Spine Lumbar)  Patient Location: PACU  Anesthesia Type:General  Level of Consciousness: awake and alert   Airway & Oxygen Therapy: Patient Spontanous Breathing and Patient connected to nasal cannula oxygen  Post-op Assessment: Report given to RN and Post -op Vital signs reviewed and stable  Post vital signs: Reviewed and stable  Last Vitals:  Vitals Value Taken Time  BP 144/107 11/04/2018 11:03 AM  Temp    Pulse 90 11/04/2018 11:06 AM  Resp 17 11/04/2018 11:06 AM  SpO2 99 % 11/04/2018 11:06 AM  Vitals shown include unvalidated device data.  Last Pain:  Vitals:   11/04/18 0622  TempSrc: Oral  PainSc:       Patients Stated Pain Goal: 2 (11/04/18 0553)  Complications: No apparent anesthesia complications

## 2018-11-04 NOTE — Op Note (Signed)
11/04/2018  10:54 AM  PATIENT:  Gary Clark  48 y.o. male  PRE-OPERATIVE DIAGNOSIS: Dynamic spondylolisthesis L5-S1, spinal stenosis L5-S1, left S1 radiculopathy  POST-OPERATIVE DIAGNOSIS:  same  PROCEDURE:   1. Decompressive lumbar laminectomy L5 S1 requiring more work than would be required for a simple exposure of the disk for PLIF in order to adequately decompress the neural elements and address the spinal stenosis 2. Posterior lumbar interbody fusion L5-S1 using porous titanium interbody cages packed with morcellized allograft and autograft soaked with a bone marrow aspirate obtained through a separate fascial incision over the right iliac crest 3. Posterior fixation L5-S1 using Alphatec cortical pedicle screws.  4. Intertransverse arthrodesis L5-S1 using morcellized autograft and allograft.  SURGEON:  Marikay Alar, MD  ASSISTANTS: Verlin Dike FNP  ANESTHESIA:  General  EBL: 100 ml  Total I/O In: 2000 [I.V.:1500; IV Piggyback:500] Out: 265 [Urine:165; Blood:100]  BLOOD ADMINISTERED:none  DRAINS: none   INDICATION FOR PROCEDURE: This patient presented with severe left leg pain. Imaging revealed dynamic spondylolisthesis L5-S1.  Nerve conduction studies consistent with acute left S1 radiculopathy.  Devious lumbar fusion L3-4 and L4-5 by other surgeons the patient tried a reasonable attempt at conservative medical measures without relief. I recommended decompression and instrumented fusion to address the stenosis as well as the segmental  instability.  Patient understood the risks, benefits, and alternatives and potential outcomes and wished to proceed.  PROCEDURE DETAILS:  The patient was brought to the operating room. After induction of generalized endotracheal anesthesia the patient was rolled into the prone position on chest rolls and all pressure points were padded. The patient's lumbar region was cleaned and then prepped with DuraPrep and draped in the usual sterile  fashion. Anesthesia was injected and then a dorsal midline incision was made and carried down to the lumbosacral fascia. The fascia was opened and the paraspinous musculature was taken down in a subperiosteal fashion to expose L5-S1. A self-retaining retractor was placed. Intraoperative fluoroscopy confirmed my level, and I started with placement of the L5 cortical pedicle screws. The pedicle screw entry zones were identified utilizing surface landmarks and  AP and lateral fluoroscopy. I scored the cortex with the high-speed drill and then used the hand drill to drill an upward and outward direction into the pedicle. I then tapped line to line. I then placed a 6.5 x 45 mm cortical pedicle screw into the pedicles of L5 bilaterally.  I then dissected in a suprafascial plane to expose the iliac crest.  Open the fascia used a Jamshidi needle to extract 60 cc of bone marrow aspirate from the iliac crest.  This was then spun down by [facility] device and 2 to 4 cc of  BMAC was soaked on morselized allograft for later arthrodesis.  I dried the hole with Surgifoam and closed the fascia.  I then turned my attention to the decompression and complete lumbar laminectomies, hemi- facetectomies, and foraminotomies were performed at L5-S1. The patient had significant spinal stenosis and this required more work than would be required for a simple exposure of the disc for posterior lumbar interbody fusion which would only require a limited laminotomy. Much more generous decompression and generous foraminotomy was undertaken in order to adequately decompress the neural elements and address the patient's leg pain. The yellow ligament was removed to expose the underlying dura and nerve roots, and generous foraminotomies were performed to adequately decompress the neural elements. Both the exiting and traversing nerve roots were decompressed on both sides until  a coronary dilator passed easily along the nerve roots. Once the decompression  was complete, I turned my attention to the posterior lower lumbar interbody fusion. The epidural venous vasculature was coagulated and cut sharply. Disc space was incised and the initial discectomy was performed with pituitary rongeurs. The disc space was distracted with sequential distractors to a height of 9 mm. We then used a series of scrapers and shavers to prepare the endplates for fusion. The midline was prepared with Epstein curettes. Once the complete discectomy was finished, we packed an appropriate sized interbody cage with local autograft and morcellized allograft, gently retracted the nerve root, and tapped the cage into position at L5-S1.  The midline between the cages was packed with morselized autograft and allograft. We then turned our attention to the placement of the lower pedicle screws. The pedicle screw entry zones were identified utilizing surface landmarks and fluoroscopy. I drilled into each pedicle utilizing the hand drill, and tapped each pedicle with the appropriate tap. We palpated with a ball probe to assure no break in the cortex. We then placed 6.5 x 40 mm pedicle screws into the pedicles bilaterally at S1. We then decorticated the transverse processes and laid a mixture of morcellized autograft and allograft out over these to perform intertransverse arthrodesis at L5-S1. We then placed lordotic rods into the multiaxial screw heads of the pedicle screws and locked these in position with the locking caps and anti-torque device. We then checked our construct with AP and lateral fluoroscopy. Irrigated with copious amounts of bacitracin-containing saline solution. Inspected the nerve roots once again to assure adequate decompression, lined to the dura with Gelfoam, placed powdered vancomycin into the wound, and closed the muscle and the fascia with 0 Vicryl. Closed the subcutaneous tissues with 2-0 Vicryl and subcuticular tissues with 3-0 Vicryl. The skin was closed with benzoin and  Steri-Strips. Dressing was then applied, the patient was awakened from general anesthesia and transported to the recovery room in stable condition. At the end of the procedure all sponge, needle and instrument counts were correct.   PLAN OF CARE: admit to inpatient  PATIENT DISPOSITION:  PACU - hemodynamically stable.   Delay start of Pharmacological VTE agent (>24hrs) due to surgical blood loss or risk of bleeding:  yes

## 2018-11-05 MED ORDER — OXYCODONE HCL 10 MG PO TABS: 10.0000 mg | ORAL_TABLET | ORAL | 0 refills | Status: DC | PRN

## 2018-11-05 MED ORDER — METHOCARBAMOL 500 MG PO TABS: 500.0000 mg | ORAL_TABLET | Freq: Four times a day (QID) | ORAL | 1 refills | Status: DC | PRN

## 2018-11-05 NOTE — Discharge Summary (Signed)
Physician Discharge Summary  Patient ID: Gary Clark MRN: [id number] DOB/AGE: [birth year] 49 y.o.  Admit date: 11/04/2018 Discharge date: 11/05/2018  Admission Diagnoses: Dynamic spondylolisthesis with stenosis L5-S1 with radiculopathy    Discharge Diagnoses: Same   Discharged Condition: good  Hospital Course: The patient was admitted on 11/04/2018 and taken to the operating room where the patient underwent plif L5-S1. The patient tolerated the procedure well and was taken to the recovery room and then to the floor in stable condition. The hospital course was routine. There were no complications. The wound remained clean dry and intact. Pt had appropriate back soreness. No complaints of leg pain or new N/T/W. The patient remained afebrile with stable vital signs, and tolerated a regular diet. The patient continued to increase activities, and pain was well controlled with oral pain medications.   Consults: None  Significant Diagnostic Studies:  Results for orders placed or performed during the hospital encounter of 10/27/18  Surgical pcr screen  Result Value Ref Range   MRSA, PCR NEGATIVE NEGATIVE   Staphylococcus aureus POSITIVE (A) NEGATIVE  Basic metabolic panel  Result Value Ref Range   Sodium 141 135 - 145 mmol/L   Potassium 4.3 3.5 - 5.1 mmol/L   Chloride 103 98 - 111 mmol/L   CO2 25 22 - 32 mmol/L   Glucose, Bld 120 (H) 70 - 99 mg/dL   BUN 17 6 - 20 mg/dL   Creatinine, Ser 1.16 0.61 - 1.24 mg/dL   Calcium 9.8 8.9 - 57.9 mg/dL   GFR calc non Af Amer >60 >60 mL/min   GFR calc Af Amer >60 >60 mL/min   Anion gap 13 5 - 15  CBC WITH DIFFERENTIAL  Result Value Ref Range   WBC 7.1 4.0 - 10.5 K/uL   RBC 4.73 4.22 - 5.81 MIL/uL   Hemoglobin 15.7 13.0 - 17.0 g/dL   HCT 03.8 33.3 - 83.2 %   MCV 96.2 80.0 - 100.0 fL   MCH 33.2 26.0 - 34.0 pg   MCHC 34.5 30.0 - 36.0 g/dL   RDW 91.9 16.6 - 06.0 %   Platelets 317 150 - 400 K/uL   nRBC 0.0 0.0 - 0.2 %   Neutrophils Relative % 57  %   Neutro Abs 4.1 1.7 - 7.7 K/uL   Lymphocytes Relative 31 %   Lymphs Abs 2.2 0.7 - 4.0 K/uL   Monocytes Relative 7 %   Monocytes Absolute 0.5 0.1 - 1.0 K/uL   Eosinophils Relative 4 %   Eosinophils Absolute 0.3 0.0 - 0.5 K/uL   Basophils Relative 1 %   Basophils Absolute 0.0 0.0 - 0.1 K/uL   Immature Granulocytes 0 %   Abs Immature Granulocytes 0.03 0.00 - 0.07 K/uL  Protime-INR  Result Value Ref Range   Prothrombin Time 12.1 11.4 - 15.2 seconds   INR 0.90   Type and screen All Cardiac and thoracic surgeries, spinal fusions, myomectomies, craniotomies, colon & liver resections, total joint revisions, same day c-section with placenta previa or accreta.  Result Value Ref Range   ABO/RH(D) O POS    Antibody Screen NEG    Sample Expiration 11/10/2018    Extend sample reason      NO TRANSFUSIONS OR PREGNANCY IN THE PAST 3 MONTHS Performed at [facility], [address], [place], [place] [address]     Chest 2 View  Result Date: 10/27/2018 CLINICAL DATA:  Pre admission for lumbar fusion EXAM: CHEST - 2 VIEW COMPARISON:  03/29/2010 FINDINGS: Lungs are clear. Cardiomediastinal silhouette and remainder of the exam is unchanged. IMPRESSION: No active cardiopulmonary disease. Electronically Signed   By: Elberta Fortis M.D.   On: 10/27/2018 14:33   Dg Lumbar Spine 2-3 Views  Result Date: 11/04/2018 CLINICAL DATA:  L5-S1 PLIF EXAM: DG C-ARM 61-120 MIN; LUMBAR SPINE - 2-3 VIEW COMPARISON:  CT of the lumbosacral spine 09/19/2018 FINDINGS: Intraoperative fluoroscopic images from L5-S1 spinal fusion demonstrate placement of intrapedicular screws within L5 and S1 vertebral bodies, and intervening disc spacer. Pre-existing fusion of the 2 levels cranial to L5-S1 is stable. Normal alignment. No evidence of immediate complications. IMPRESSION: Intraoperative fluoroscopic images from L5-S1 spinal fusion without evidence of immediate complications. Fluoroscopy time is recorded as 54 seconds.  Electronically Signed   By: Ted Mcalpine M.D.   On: 11/04/2018 10:48   Dg C-arm 1-60 Min  Result Date: 11/04/2018 CLINICAL DATA:  L5-S1 PLIF EXAM: DG C-ARM 61-120 MIN; LUMBAR SPINE - 2-3 VIEW COMPARISON:  CT of the lumbosacral spine 09/19/2018 FINDINGS: Intraoperative fluoroscopic images from L5-S1 spinal fusion demonstrate placement of intrapedicular screws within L5 and S1 vertebral bodies, and intervening disc spacer. Pre-existing fusion of the 2 levels cranial to L5-S1 is stable. Normal alignment. No evidence of immediate complications. IMPRESSION: Intraoperative fluoroscopic images from L5-S1 spinal fusion without evidence of immediate complications. Fluoroscopy time is recorded as 54 seconds. Electronically Signed   By: Ted Mcalpine M.D.   On: 11/04/2018 10:48    Antibiotics:  Anti-infectives (From admission, onward)   Start     Dose/Rate Route Frequency Ordered Stop   11/04/18 1315  ceFAZolin (ANCEF) IVPB 2g/100 mL premix     2 g 200 mL/hr over 30 Minutes Intravenous Every 8 hours 11/04/18 1306 11/04/18 2128   11/04/18 0854  vancomycin (VANCOCIN) powder  Status:  Discontinued       As needed 11/04/18 0854 11/04/18 1056   11/04/18 0852  bacitracin 50,000 Units in sodium chloride 0.9 % 500 mL irrigation  Status:  Discontinued       As needed 11/04/18 0852 11/04/18 1056   11/04/18 0600  ceFAZolin (ANCEF) IVPB 2g/100 mL premix     2 g 200 mL/hr over 30 Minutes Intravenous On call to O.R. 11/04/18 0542 11/04/18 0745      Discharge Exam: Blood pressure 125/87, pulse 95, temperature 97.9 F (36.6 C), temperature source Oral, resp. rate 19, height 5\' 6"  (1.676 m), weight 101.2 kg, SpO2 99 %. Neurologic: Grossly normal Dressing dry  Discharge Medications:   Allergies as of 11/05/2018      Reactions   Codeine Other (See Comments)   Gets "loopy"      Medication List    STOP taking these medications   acetaminophen 650 MG CR tablet Commonly known as:  TYLENOL    HYDROcodone-acetaminophen 7.5-325 MG tablet Commonly known as:  NORCO   ibuprofen 200 MG tablet Commonly known as:  ADVIL,MOTRIN   ibuprofen 800 MG tablet Commonly known as:  ADVIL,MOTRIN     TAKE these medications   citalopram 40 MG tablet Commonly known as:  CELEXA TAKE 1 TABLET BY MOUTH ONCE DAILY ( PATIENT NEEDS APPOINTMENT ) What changed:  See the new instructions.   hydrochlorothiazide 25 MG tablet Commonly known as:  HYDRODIURIL Take 1 tablet (25 mg total) by mouth daily.   ipratropium 0.06 % nasal spray Commonly known as:  ATROVENT Place 2 sprays into both nostrils 4 (four) times daily.   lisinopril 20 MG tablet Commonly known  as:  PRINIVIL,ZESTRIL Take 1.5 tablets (30 mg total) by mouth daily. What changed:  how much to take   methocarbamol 500 MG tablet Commonly known as:  ROBAXIN Take 1 tablet (500 mg total) by mouth every 6 (six) hours as needed for muscle spasms.   Oxycodone HCl 10 MG Tabs Take 1 tablet (10 mg total) by mouth every 4 (four) hours as needed for severe pain ((score 7 to 10)).   rosuvastatin 40 MG tablet Commonly known as:  CRESTOR Take 1 tablet (40 mg total) by mouth daily.   tiZANidine 4 MG tablet Commonly known as:  ZANAFLEX Take 1 tablet (4 mg total) by mouth every 6 (six) hours as needed for muscle spasms.            Durable Medical Equipment  (From admission, onward)         Start     Ordered   11/04/18 1307  DME Walker rolling  Once    Question:  Patient needs a walker to treat with the following condition  Answer:  S/P lumbar fusion   11/04/18 1306   11/04/18 1307  DME 3 n 1  Once     11/04/18 1306          Disposition: home   Final Dx: PLIF L5-S1  Discharge Instructions     Remove dressing in 72 hours   Complete by:  As directed    Call MD for:  difficulty breathing, headache or visual disturbances   Complete by:  As directed    Call MD for:  persistant nausea and vomiting   Complete by:  As directed     Call MD for:  redness, tenderness, or signs of infection (pain, swelling, redness, odor or green/yellow discharge around incision site)   Complete by:  As directed    Call MD for:  severe uncontrolled pain   Complete by:  As directed    Call MD for:  temperature >100.4   Complete by:  As directed    Diet - low sodium heart healthy   Complete by:  As directed    Increase activity slowly   Complete by:  As directed       Follow-up Information    Tia Alert, MD. Schedule an appointment as soon as possible for a visit in 2 week(s).   Specialty:  Neurosurgery Contact information: [address] [place] [address] [phone]            Signed: Tia Alert 11/05/2018, 8:52 AM

## 2018-11-05 NOTE — Evaluation (Signed)
Physical Therapy Evaluation Patient Details Name: Gary Clark MRN: [id number] DOB: 19-Dec-1970 Today's Date: 11/05/2018   History of Present Illness  Patient is a 48 yo male s/p Posterior Lumbar Interbody fusion Lumbar five-Sacral One - Posterior Lateral and Interbody fusion   Clinical Impression  Patient seen for mobility assessment s/p spinal surgery. Mobilizing well. Educated patient on precautions, mobility expectations, safety and car transfers. No further acute PT needs. Will sign off.     Follow Up Recommendations No PT follow up    Equipment Recommendations  None recommended by PT    Recommendations for Other Services       Precautions / Restrictions Precautions Precautions: Back Precaution Booklet Issued: Yes (comment) Precaution Comments: verbally reviewed Required Braces or Orthoses: Spinal Brace Spinal Brace: Lumbar corset Restrictions Weight Bearing Restrictions: No      Mobility  Bed Mobility               General bed mobility comments: recieved EOB but able to verbalaize technique and reports no difficulty  Transfers Overall transfer level: Independent Equipment used: None                Ambulation/Gait Ambulation/Gait assistance: Independent Gait Distance (Feet): 320 Feet Assistive device: None Gait Pattern/deviations: WFL(Within Functional Limits)   Gait velocity interpretation: 1.31 - 2.62 ft/sec, indicative of limited community ambulator General Gait Details: steady with ambulation   Stairs Stairs: Yes Stairs assistance: Modified independent (Device/Increase time) Stair Management: One rail Left Number of Stairs: 12 General stair comments: no difficulty  Wheelchair Mobility    Modified Rankin (Stroke Patients Only)       Balance Overall balance assessment: Mild deficits observed, not formally tested                                           Pertinent Vitals/Pain Pain Assessment: Faces Faces Pain  Scale: Hurts little more Pain Location: back Pain Descriptors / Indicators: Sore Pain Intervention(s): Monitored during session    Home Living Family/patient expects to be discharged to:: Private residence Living Arrangements: Spouse/significant other Available Help at Discharge: Family;Available 24 hours/day Type of Home: House Home Access: Stairs to enter Entrance Stairs-Rails: Doctor, general practice of Steps: 7 Home Layout: One level        Prior Function Level of Independence: Independent               Hand Dominance   Dominant Hand: Right    Extremity/Trunk Assessment   Upper Extremity Assessment Upper Extremity Assessment: Overall WFL for tasks assessed    Lower Extremity Assessment Lower Extremity Assessment: Overall WFL for tasks assessed       Communication   Communication: No difficulties  Cognition Arousal/Alertness: Awake/alert Behavior During Therapy: WFL for tasks assessed/performed Overall Cognitive Status: Within Functional Limits for tasks assessed                                        General Comments      Exercises     Assessment/Plan    PT Assessment Patent does not need any further PT services  PT Problem List         PT Treatment Interventions      PT Goals (Current goals can be found in the Care Plan section)  Acute  Rehab PT Goals PT Goal Formulation: All assessment and education complete, DC therapy    Frequency     Barriers to discharge        Co-evaluation               AM-PAC PT "6 Clicks" Mobility  Outcome Measure Help needed turning from your back to your side while in a flat bed without using bedrails?: None Help needed moving from lying on your back to sitting on the side of a flat bed without using bedrails?: None Help needed moving to and from a bed to a chair (including a wheelchair)?: None Help needed standing up from a chair using your arms (e.g., wheelchair or  bedside chair)?: None Help needed to walk in hospital room?: A Little Help needed climbing 3-5 steps with a railing? : A Little 6 Click Score: 22    End of Session Equipment Utilized During Treatment: Back brace Activity Tolerance: Patient tolerated treatment well Patient left: in bed;with family/visitor present(sitting EOB) Nurse Communication: Mobility status PT Visit Diagnosis: Difficulty in walking, not elsewhere classified (R26.2)    Time: 3710-6269 PT Time Calculation (min) (ACUTE ONLY): 16 min   Charges:   PT Evaluation $PT Eval Low Complexity: 1 Low          Charlotte Crumb, PT DPT  Board Certified Neurologic Specialist Acute Rehabilitation Services Pager [phone] Office [phone]   Fabio Asa 11/05/2018, 8:04 AM

## 2018-11-05 NOTE — Progress Notes (Signed)
Patient alert and oriented, Gary Clark's well, voiding adequate amount of urine, swallowing without difficulty, c/o moderate pain and meds given prior to discharged for ride and discomfort. Patient discharged home with family. Script sent to the pharmacy and discharged instructions given to patient. Patient and family stated understanding of instructions given. Patient has an appointment with Dr.  Yetta Barre

## 2018-11-05 NOTE — Evaluation (Signed)
Occupational Therapy Evaluation Patient Details Name: Gary Clark MRN: [id number] DOB: [birth year] Today's Date: 11/05/2018    History of Present Illness Patient is a 48 yo male s/p Posterior Lumbar Interbody fusion Lumbar five-Sacral One - Posterior Lateral and Interbody fusion    Clinical Impression   Patient is s/p Posterior Lumbar Interbody fusion Lumbar five-Sacral One - Posterior Lateral and Interbody fusion surgery resulting in the deficits listed below (see OT Problem List). The patient lives with significant other and will be able to assist the patient on return home. The patient was able to report 3/3 precautions and able to don and doff brace. The patient was recommended to use 3 in 1 over toilet and use of transfer bench in shower with grab bar. They reported they have someone who might have adaptive devices to use when home. The patient was also educated about use reacher with LE dressing and sock aid to don socks. However, reported that significant other will assist if needed to complete when returning home.       Follow Up Recommendations  No OT follow up    Equipment Recommendations  3 in 1 bedside commode;Tub/shower bench    Recommendations for Other Services       Precautions / Restrictions Precautions Precautions: Back Precaution Booklet Issued: No Precaution Comments: verbally reviewed Required Braces or Orthoses: Spinal Brace Spinal Brace: Lumbar corset Restrictions Weight Bearing Restrictions: No      Mobility Bed Mobility Overal bed mobility: Modified Independent             General bed mobility comments: recieved EOB but able to verbalaize technique and reports no difficulty  Transfers Overall transfer level: Independent Equipment used: None                  Balance Overall balance assessment: Mild deficits observed, not formally tested                                         ADL either performed or assessed with  clinical judgement   ADL Overall ADL's : Needs assistance/impaired Eating/Feeding: Independent   Grooming: Wash/dry hands;Modified independent   Upper Body Bathing: Modified independent;Sitting;Standing   Lower Body Bathing: Minimal assistance;Cueing for safety;Cueing for sequencing;With adaptive equipment   Upper Body Dressing : Modified independent;Sitting;Standing   Lower Body Dressing: Minimal assistance;Cueing for back precautions;Sit to/from stand   Toilet Transfer: Modified Independent;Comfort height toilet;Grab bars   Toileting- Clothing Manipulation and Hygiene: Modified independent   Tub/ Engineer, structural: Tub transfer;Minimal assistance;Adhering to hip precautions;Tub bench   Functional mobility during ADLs: Modified independent       Vision Baseline Vision/History: No visual deficits Patient Visual Report: No change from baseline Vision Assessment?: No apparent visual deficits     Perception Perception Perception Tested?: No   Praxis Praxis Praxis tested?: Within functional limits    Pertinent Vitals/Pain Pain Assessment: Faces Pain Score: 3  Faces Pain Scale: Hurts little more Pain Location: back Pain Descriptors / Indicators: Sore Pain Intervention(s): Limited activity within patient's tolerance;Monitored during session;Repositioned     Hand Dominance Right   Extremity/Trunk Assessment Upper Extremity Assessment Upper Extremity Assessment: Overall WFL for tasks assessed   Lower Extremity Assessment Lower Extremity Assessment: Defer to PT evaluation   Cervical / Trunk Assessment Cervical / Trunk Assessment: Other exceptions   Communication Communication Communication: No difficulties   Cognition Arousal/Alertness: Awake/alert Behavior During  Therapy: WFL for tasks assessed/performed Overall Cognitive Status: Within Functional Limits for tasks assessed                                     General Comments       Exercises      Shoulder Instructions      Home Living Family/patient expects to be discharged to:: Private residence Living Arrangements: Spouse/significant other Available Help at Discharge: Family;Available 24 hours/day Type of Home: House Home Access: Stairs to enter [employer] of Steps: 7 Entrance Stairs-Rails: Right;Left Home Layout: One level     Bathroom Shower/Tub: Chief Strategy Officer: Standard Bathroom Accessibility: Yes How Accessible: Accessible via walker Home Equipment: Other (comment)   Additional Comments: they reported they may be able to get transfer bench and 3 in 1 commode      Prior Functioning/Environment Level of Independence: Independent                 OT Problem List: Decreased strength;Decreased safety awareness;Decreased knowledge of use of DME or AE;Decreased knowledge of precautions;Pain      OT Treatment/Interventions:      OT Goals(Current goals can be found in the care plan section) Acute Rehab OT Goals Patient Stated Goal: retuen home to sleep OT Goal Formulation: With patient/family Time For Goal Achievement: 11/12/18 Potential to Achieve Goals: Good  OT Frequency:     Barriers to D/C:            Co-evaluation              AM-PAC OT "6 Clicks" Daily Activity     Outcome Measure Help from another person eating meals?: None Help from another person taking care of personal grooming?: None Help from another person toileting, which includes using toliet, bedpan, or urinal?: None Help from another person bathing (including washing, rinsing, drying)?: A Little Help from another person to put on and taking off regular upper body clothing?: None Help from another person to put on and taking off regular lower body clothing?: A Little 6 Click Score: 22   End of Session Equipment Utilized During Treatment: Back brace  Activity Tolerance: Patient tolerated treatment well;No increased pain Patient left: in  bed;with call bell/phone within reach;with family/visitor present  OT Visit Diagnosis: Unsteadiness on feet (R26.81);Pain Pain - part of body: (back)                Time: 2409-7353 OT Time Calculation (min): 19 min Charges:  OT General Charges $OT Visit: 1 Visit OT Evaluation $OT Eval Low Complexity: 1 Low  Alphia Moh OTR/L  Acute Rehab Services  [phone] office number [phone] pager number   Alphia Moh 11/05/2018, 9:29 AM

## 2018-12-12 ENCOUNTER — Telehealth: Payer: Self-pay

## 2018-12-12 NOTE — Telephone Encounter (Signed)
Author phoned pt. to assess interest in scheduling virtual awv.No answer; author left detailed VM asking for return call if interested.

## 2019-01-05 ENCOUNTER — Ambulatory Visit: Payer: Medicare HMO | Admitting: Family Medicine

## 2019-01-05 ENCOUNTER — Other Ambulatory Visit: Payer: Self-pay

## 2019-01-19 ENCOUNTER — Other Ambulatory Visit: Payer: Self-pay | Admitting: *Deleted

## 2019-01-19 MED ORDER — CITALOPRAM HYDROBROMIDE 40 MG PO TABS: ORAL_TABLET | ORAL | 0 refills | Status: DC

## 2019-01-19 NOTE — Telephone Encounter (Signed)
Rx done. 

## 2019-02-01 ENCOUNTER — Other Ambulatory Visit: Payer: Self-pay

## 2019-02-01 ENCOUNTER — Ambulatory Visit: Payer: Medicare HMO | Admitting: Internal Medicine

## 2019-02-08 ENCOUNTER — Other Ambulatory Visit: Payer: Self-pay

## 2019-02-08 ENCOUNTER — Ambulatory Visit (INDEPENDENT_AMBULATORY_CARE_PROVIDER_SITE_OTHER): Payer: Medicare HMO | Admitting: Internal Medicine

## 2019-02-08 DIAGNOSIS — E785 Hyperlipidemia, unspecified: Secondary | ICD-10-CM | POA: Diagnosis not present

## 2019-02-08 DIAGNOSIS — F3340 Major depressive disorder, recurrent, in remission, unspecified: Secondary | ICD-10-CM | POA: Diagnosis not present

## 2019-02-08 DIAGNOSIS — M545 Low back pain, unspecified: Secondary | ICD-10-CM

## 2019-02-08 DIAGNOSIS — I10 Essential (primary) hypertension: Secondary | ICD-10-CM | POA: Diagnosis not present

## 2019-02-08 DIAGNOSIS — G8929 Other chronic pain: Secondary | ICD-10-CM

## 2019-02-08 DIAGNOSIS — R7302 Impaired glucose tolerance (oral): Secondary | ICD-10-CM | POA: Diagnosis not present

## 2019-02-08 NOTE — Progress Notes (Signed)
Virtual Visit via Video Note  I connected with Gary Clark on 02/08/19 at  2:00 PM EDT by a video enabled telemedicine application and verified that I am speaking with the correct person using two identifiers.  Location patient: home Location provider: work office Persons participating in the virtual visit: patient, provider  I discussed the limitations of evaluation and management by telemedicine and the availability of in person appointments. The patient expressed understanding and agreed to proceed.   HPI: This is a scheduled visit to establish care and to discuss chronic medical conditions.  His past medical history significant for:  1.  Morbid obesity, he is interested on getting information to the healthy weight and wellness clinic.  #2 hyperlipidemia on Crestor  3.  Impaired glucose tolerance his most recent A1c was 6 in January 2020.  4.  Depression on 40 mg of Celexa, he wonders whether this may need to be increased.  5.  Hypertension, this has been well controlled in the past on lisinopril/hydrochlorothiazide.  6.  Back pain with a recent lumbar fusion in February 2020, he continues to have issues with his back, his surgeon was Dr. Marikay Alar, he is currently on steroids and gabapentin and will schedule follow-up with them when available.  He has a birthday today, he turns 48.  He has been on disability since 2010 due to his back issues.  He is married, he has a 87 year old son with cerebral palsy.  He is a never smoker, he drinks alcohol daily 2-3 beers, his family history is only significant for a mother with breast cancer.   ROS: Constitutional: Denies fever, chills, diaphoresis, appetite change and fatigue.  HEENT: Denies photophobia, eye pain, redness, hearing loss, ear pain, congestion, sore throat, rhinorrhea, sneezing, mouth sores, trouble swallowing, neck pain, neck stiffness and tinnitus.   Respiratory: Denies SOB, DOE, cough, chest tightness,  and  wheezing.   Cardiovascular: Denies chest pain, palpitations and leg swelling.  Gastrointestinal: Denies nausea, vomiting, abdominal pain, diarrhea, constipation, blood in stool and abdominal distention.  Genitourinary: Denies dysuria, urgency, frequency, hematuria, flank pain and difficulty urinating.  Endocrine: Denies: hot or cold intolerance, sweats, changes in hair or nails, polyuria, polydipsia. Musculoskeletal: Denies myalgias, back pain, joint swelling, arthralgias and gait problem.  Skin: Denies pallor, rash and wound.  Neurological: Denies dizziness, seizures, syncope, weakness, light-headedness, numbness and headaches.  Hematological: Denies adenopathy. Easy bruising, personal or family bleeding history  Psychiatric/Behavioral: Denies suicidal ideation, mood changes, confusion, nervousness, sleep disturbance and agitation   Past Medical History:  Diagnosis Date  . Allergy   . Anxiety   . Arthritis   . Asthma    years ago  . Back pain   . Blood in stool   . Cluster headache   . Depression   . Headache(784.0)    frequent   . History of kidney stones    passed 2 times  . Hyperlipidemia   . Hypertension   . Lumbosacral spondylosis without myelopathy 06/15/2013  . Migraines     10/27/2018- history of. Not current  . Postlaminectomy syndrome, lumbar region 03/08/2015   Status post L4-L5 decompression and fusion, anterior/posterior in 2011   . Pre-diabetes    pre diabetic per his PCP  . Sleep apnea    tested in 2014 positive   . Stroke [facility]) 2008   pt states "mini-Stroke"-   . UTI (urinary tract infection)     Past Surgical History:  Procedure Laterality Date  . ANTERIOR  AND POSTERIOR SPINAL FUSION  2011  . ANTERIOR LAT LUMBAR FUSION N/A 10/01/2016   Procedure: XLIF L3-4 ANTERIOR LATERAL LUMBAR FUSION 1 LEVEL;  Surgeon: Venita Lickahari Brooks, MD;  Location: [facility] OR;  Service: Orthopedics;  Laterality: N/A;  . BACK SURGERY    . HARDWARE REMOVAL N/A 10/01/2016   Procedure: Removal  of pedical screws L4-5 HARDWARE REMOVAL;  Surgeon: Venita Lickahari Brooks, MD;  Location: [facility] OR;  Service: Orthopedics;  Laterality: N/A;  . LUMBAR LAMINECTOMY/DECOMPRESSION MICRODISCECTOMY  2010    Family History  Problem Relation Age of Onset  . Heart disease Mother   . Hypertension Mother   . Arthritis Father   . Hypertension Father   . Prostate cancer Father   . Miscarriages / Stillbirths Maternal Grandmother   . Heart disease Maternal Grandfather   . Sudden death Maternal Grandfather   . Breast cancer Sister   . Migraines Sister     SOCIAL HX:   reports that he has never smoked. He has never used smokeless tobacco. He reports current alcohol use of about 10.0 standard drinks of alcohol per week. He reports that he does not use drugs.   Current Outpatient Medications:  .  citalopram (CELEXA) 40 MG tablet, TAKE 1 TABLET BY MOUTH ONCE DAILY ( PATIENT NEEDS APPOINTMENT ), Disp: 90 tablet, Rfl: 0 .  hydrochlorothiazide (HYDRODIURIL) 25 MG tablet, Take 1 tablet (25 mg total) by mouth daily., Disp: 90 tablet, Rfl: 3 .  ipratropium (ATROVENT) 0.06 % nasal spray, Place 2 sprays into both nostrils 4 (four) times daily. (Patient not taking: Reported on 10/14/2018), Disp: 15 mL, Rfl: 0 .  lisinopril (PRINIVIL,ZESTRIL) 20 MG tablet, Take 1.5 tablets (30 mg total) by mouth daily. (Patient taking differently: Take 20 mg by mouth daily. ), Disp: 135 tablet, Rfl: 3 .  methocarbamol (ROBAXIN) 500 MG tablet, Take 1 tablet (500 mg total) by mouth every 6 (six) hours as needed for muscle spasms., Disp: 60 tablet, Rfl: 1 .  oxyCODONE 10 MG TABS, Take 1 tablet (10 mg total) by mouth every 4 (four) hours as needed for severe pain ((score 7 to 10))., Disp: 40 tablet, Rfl: 0 .  rosuvastatin (CRESTOR) 40 MG tablet, Take 1 tablet (40 mg total) by mouth daily., Disp: 90 tablet, Rfl: 3 .  tiZANidine (ZANAFLEX) 4 MG tablet, Take 1 tablet (4 mg total) by mouth every 6 (six) hours as needed for muscle spasms. (Patient not  taking: Reported on 10/14/2018), Disp: 30 tablet, Rfl: 0  EXAM:   VITALS per patient if applicable: None reported  GENERAL: alert, oriented, appears well and in no acute distress  HEENT: atraumatic, conjunttiva clear, no obvious abnormalities on inspection of external nose and ears  NECK: normal movements of the head and neck  LUNGS: on inspection no signs of respiratory distress, breathing rate appears normal, no obvious gross increased work of breathing, gasping or wheezing  CV: no obvious cyanosis  MS: moves all visible extremities without noticeable abnormality  PSYCH/NEURO: pleasant and cooperative, no obvious depression or anxiety, speech and thought processing grossly intact  ASSESSMENT AND PLAN:   Chronic low back pain, unspecified back pain laterality, unspecified whether sciatica present -Currently on steroids and gabapentin as prescribed by his neurosurgeon, will follow up with them when available due to COVID-19.  Hyperlipidemia, unspecified hyperlipidemia type -Last LDL was 154 in August 2017, he is on Crestor. -Check lipids at next CPE.  Essential hypertension -Well-controlled in the past, is not currently during ambulatory blood pressure measurements.  Major depressive disorder, recurrent, in remission (HCC) -On Celexa 40 mg, he is not interested in CBT or psychiatry referrals. -We can continue to discuss escalating management at next in person visit.  IGT (impaired glucose tolerance) -A1c will need to be followed every 6 months. -We have discussed importance of diet and exercise.     I discussed the assessment and treatment plan with the patient. The patient was provided an opportunity to ask questions and all were answered. The patient agreed with the plan and demonstrated an understanding of the instructions.   The patient was advised to call back or seek an in-person evaluation if the symptoms worsen or if the condition fails to improve as anticipated.     Chaya Jan, MD  [facility]

## 2019-05-23 DIAGNOSIS — M545 Low back pain: Secondary | ICD-10-CM | POA: Diagnosis not present

## 2019-05-23 DIAGNOSIS — M4317 Spondylolisthesis, lumbosacral region: Secondary | ICD-10-CM | POA: Diagnosis not present

## 2019-05-25 IMAGING — RF DG LUMBAR SPINE 2-3V
1 series · 2 of 2 positions shown · non-contrast
Comparison: CT of the lumbosacral spine 09/19/2018

CLINICAL DATA: L5-S1 PLIF

EXAM:
DG C-ARM 61-120 MIN; LUMBAR SPINE - 2-3 VIEW

[Series 1: run · 2 of 2 slices shown]
[im 1/2]
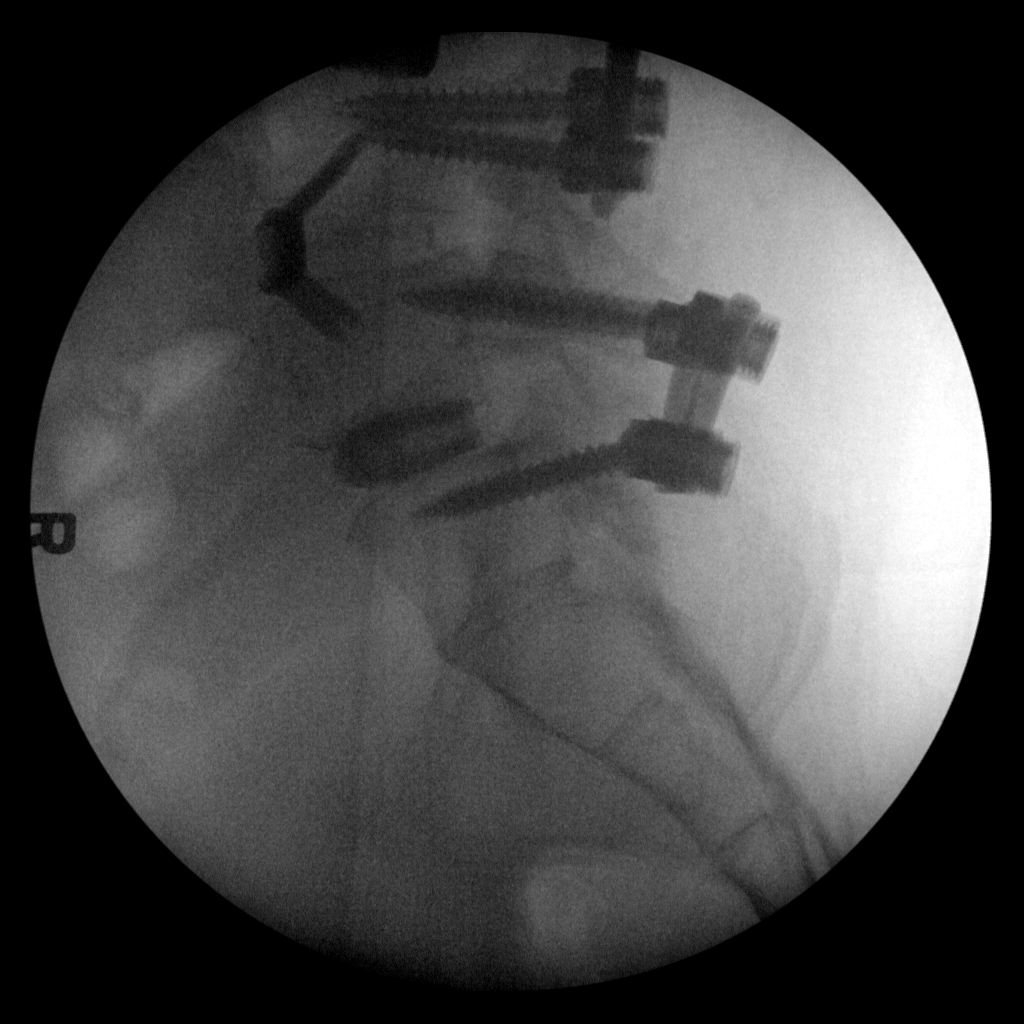
[im 2/2]
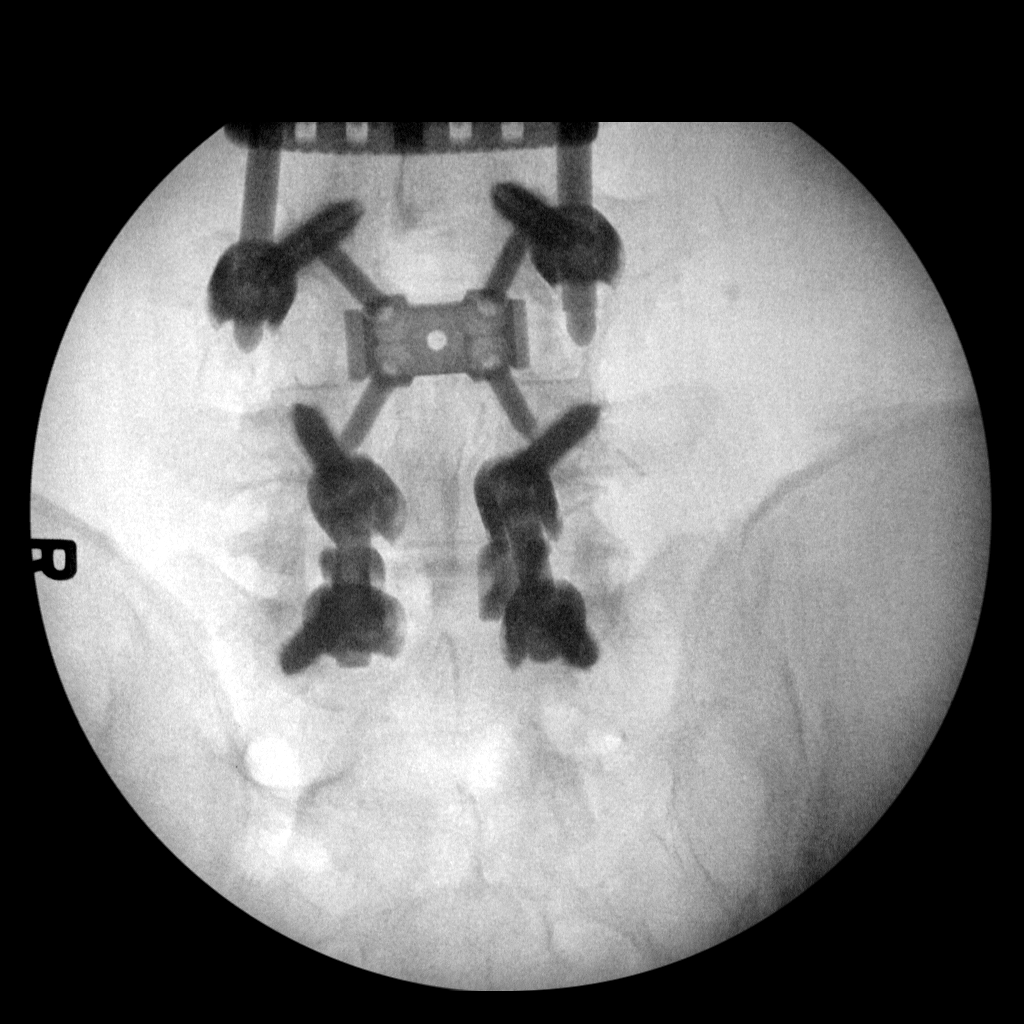

[2 of 2 positions shown; findings below may reference images not displayed]

FINDINGS: Intraoperative fluoroscopic images from L5-S1 spinal fusion
demonstrate placement of intrapedicular screws within L5 and S1
vertebral bodies, and intervening disc spacer. Pre-existing fusion
of the 2 levels cranial to L5-S1 is stable. Normal alignment. No
evidence of immediate complications.
IMPRESSION: Intraoperative fluoroscopic images from L5-S1 spinal fusion without
evidence of immediate complications.

Fluoroscopy time is recorded as 54 seconds.

## 2019-06-05 ENCOUNTER — Ambulatory Visit (INDEPENDENT_AMBULATORY_CARE_PROVIDER_SITE_OTHER): Payer: Medicare HMO | Admitting: Family Medicine

## 2019-06-05 ENCOUNTER — Other Ambulatory Visit: Payer: Self-pay

## 2019-06-05 ENCOUNTER — Encounter: Payer: Self-pay | Admitting: Family Medicine

## 2019-06-05 VITALS — BP 130/80 | HR 81 | Temp 98.1°F | Resp 12 | Ht 66.0 in | Wt 220.5 lb

## 2019-06-05 DIAGNOSIS — I10 Essential (primary) hypertension: Secondary | ICD-10-CM

## 2019-06-05 DIAGNOSIS — M25512 Pain in left shoulder: Secondary | ICD-10-CM

## 2019-06-05 DIAGNOSIS — M549 Dorsalgia, unspecified: Secondary | ICD-10-CM | POA: Diagnosis not present

## 2019-06-05 DIAGNOSIS — F3340 Major depressive disorder, recurrent, in remission, unspecified: Secondary | ICD-10-CM

## 2019-06-05 DIAGNOSIS — R519 Headache, unspecified: Secondary | ICD-10-CM

## 2019-06-05 DIAGNOSIS — R51 Headache: Secondary | ICD-10-CM | POA: Diagnosis not present

## 2019-06-05 MED ORDER — METHOCARBAMOL 500 MG PO TABS: 500.0000 mg | ORAL_TABLET | Freq: Four times a day (QID) | ORAL | 1 refills | Status: DC | PRN

## 2019-06-05 MED ORDER — DICLOFENAC SODIUM 1 % TD GEL: 4.0000 g | Freq: Four times a day (QID) | TRANSDERMAL | 0 refills | Status: DC

## 2019-06-05 MED ORDER — CITALOPRAM HYDROBROMIDE 40 MG PO TABS: ORAL_TABLET | ORAL | 1 refills | Status: DC

## 2019-06-05 MED ORDER — AMITRIPTYLINE HCL 25 MG PO TABS: 25.0000 mg | ORAL_TABLET | Freq: Every day | ORAL | 1 refills | Status: DC

## 2019-06-05 MED ORDER — LISINOPRIL 20 MG PO TABS: 20.0000 mg | ORAL_TABLET | Freq: Every day | ORAL | 0 refills | Status: DC

## 2019-06-05 NOTE — Patient Instructions (Signed)
A few things to remember from today's visit:   Acute pain of left shoulder  Essential hypertension  Major depressive disorder, recurrent, in remission ([place]) - Plan: amitriptyline (ELAVIL) 25 MG tablet  Recurrent occipital headache - Plan: amitriptyline (ELAVIL) 25 MG tablet  Upper back pain on left side - Plan: methocarbamol (ROBAXIN) 500 MG tablet  Amitriptyline added today to take at bedtime, this medication may help with headache, insomnia, and depression. You can start 1/2 tablet initially and increase it to the whole tablet if well-tolerated. Monitor your blood pressure at home. Please follow-up with your doctor in 4 to 6 weeks, before if needed.  Please be sure medication list is accurate. If a new problem present, please set up appointment sooner than planned today.

## 2019-06-05 NOTE — Progress Notes (Signed)
ACUTE VISIT   HPI:  Chief Complaint  Patient presents with  . Shoulder Pain  . Medication Refill    Mr.Hridhaan D Clark is a 48 y.o. male, who is here today complaining of intermittent episodes of left shoulder pain. Point anterior aspect of shoulder. Denies prior hx.  Sudden onset of sharp pain, it lasts a few seconds. He has not identified exacerbating or alleviating factors. No limitation of range of motion. He denies any trauma or unusual activity that could have caused the pain.  Negative for numbness, tingling, or rash. Left upper back pain. He has not tried OTC medication.  Right-handed.  History of lower back pain. He is not taking meloxicam because he aggravates headaches. Currently he is not taking medication for this problem, he was on Methocarbamol.  -He is also requesting refills on lisinopril 20 mg and Celexa 40 mg.  Long history of depression, he feels that Celexa may not be helping much. Anxiety and depression exacerbated by COVID-19 pandemia. He denies suicidal thoughts. + Stress.  + Insomnia, which is "normal" for him.  Occipital headache for a while,"always",getting worse for the past month. He denies associated visual changes, photophobia, nausea, or vomiting. Negative for local neurologic deficit.  He is not checking BP at home. Currently he is on lisinopril 20 mg daily and HCTZ 25 mg daily. Negative for chest pain, dyspnea, palpitations, decreased urine output, gross hematuria, or edema.  Lab Results  Component Value Date   CREATININE 1.04 10/27/2018   BUN 17 10/27/2018   NA 141 10/27/2018   K 4.3 10/27/2018   CL 103 10/27/2018   CO2 25 10/27/2018     Review of Systems  Constitutional: Positive for fatigue. Negative for activity change, appetite change, chills and fever.  HENT: Negative for sore throat and trouble swallowing.   Respiratory: Negative for cough and wheezing.   Gastrointestinal: Negative for abdominal pain.    No changes in bowel habits.  Neurological: Negative for syncope and facial asymmetry.  Psychiatric/Behavioral: Negative for confusion and hallucinations.  Rest see pertinent positives and negatives per HPI.   Current Outpatient Medications on File Prior to Visit  Medication Sig Dispense Refill  . gabapentin (NEURONTIN) 300 MG capsule Take 300 mg by mouth 3 (three) times daily.    . hydrochlorothiazide (HYDRODIURIL) 25 MG tablet Take 1 tablet (25 mg total) by mouth daily. 90 tablet 3  . ipratropium (ATROVENT) 0.06 % nasal spray Place 2 sprays into both nostrils 4 (four) times daily. 15 mL 0  . rosuvastatin (CRESTOR) 40 MG tablet Take 1 tablet (40 mg total) by mouth daily. 90 tablet 3  . oxyCODONE 10 MG TABS Take 1 tablet (10 mg total) by mouth every 4 (four) hours as needed for severe pain ((score 7 to 10)). (Patient not taking: Reported on 06/05/2019) 40 tablet 0   No current facility-administered medications on file prior to visit.     Past Medical History:  Diagnosis Date  . Allergy   . Anxiety   . Arthritis   . Asthma    years ago  . Back pain   . Blood in stool   . Cluster headache   . Depression   . Headache(784.0)    frequent   . History of kidney stones    passed 2 times  . Hyperlipidemia   . Hypertension   . Lumbosacral spondylosis without myelopathy 06/15/2013  . Migraines     10/27/2018- history of. Not current  .  Postlaminectomy syndrome, lumbar region 03/08/2015   Status post L4-L5 decompression and fusion, anterior/posterior in 2011   . Pre-diabetes    pre diabetic per his PCP  . Sleep apnea    tested in 2014 positive   . Stroke [facility]) 2008   pt states "mini-Stroke"-   . UTI (urinary tract infection)    Allergies  Allergen Reactions  . Codeine Other (See Comments)    Gets "loopy"    Social History   Socioeconomic History  . Marital status: Married    Spouse name: [name]  . Number of children: 1  . Years of education: 12th  . Highest education  level: Not on file  Occupational History    Comment: n/a  Social Needs  . Financial resource strain: Not on file  . Food insecurity    Worry: Not on file    Inability: Not on file  . Transportation needs    Medical: Not on file    Non-medical: Not on file  Tobacco Use  . Smoking status: Never Smoker  . Smokeless tobacco: Never Used  Substance and Sexual Activity  . Alcohol use: Yes    Alcohol/week: 10.0 standard drinks    Types: 10 Cans of beer per week  . Drug use: No  . Sexual activity: Yes    Birth control/protection: None  Lifestyle  . Physical activity    Days per week: Not on file    Minutes per session: Not on file  . Stress: Not on file  Relationships  . Social Musician on phone: Not on file    Gets together: Not on file    Attends religious service: Not on file    Active member of club or organization: Not on file    Attends meetings of clubs or organizations: Not on file    Relationship status: Not on file  Other Topics Concern  . Not on file  Social History Narrative   Patient lives at home with his family.   Caffeine Use: 2 cups daily    Vitals:   06/05/19 1101  BP: 130/80  Pulse: 81  Resp: 12  Temp: 98.1 F (36.7 C)  SpO2: 97%   Body mass index is 35.59 kg/m.  Physical Exam  Nursing note and vitals reviewed. Constitutional: He is oriented to person, place, and time. He appears well-developed. No distress.  HENT:  Head: Normocephalic and atraumatic.  Mouth/Throat: Oropharynx is clear and moist. Mucous membranes are dry.  Eyes: Pupils are equal, round, and reactive to light. Conjunctivae are normal.  Cardiovascular: Normal rate and regular rhythm.  No murmur heard. Respiratory: Effort normal and breath sounds normal. No respiratory distress.  Musculoskeletal:        General: No edema.     Cervical back: He exhibits tenderness. He exhibits normal range of motion.       Back:     Comments: Left shoulder: No deformity, edema, or  erythema appreciated.No muscle atrophy. Juanetta Gosling' test neg, empty can supraspinatus test neg, lift-Off Subscapularis test negative.. No limitation of ROM left,mils right shoulder. Movement elicits pain.  Pain upon palpation of left trapezium muscle, spasm. Pain is also elicited by cervical flexion and rotation.   Lymphadenopathy:    He has no cervical adenopathy.  Neurological: He is alert and oriented to person, place, and time. He has normal strength. No cranial nerve deficit. Gait normal.  Skin: Skin is warm. No rash noted. No erythema.  Psychiatric: His mood appears anxious.  Cognition and memory are normal. He exhibits a depressed mood.  Well groomed, good eye contact.    ASSESSMENT AND PLAN:  Mr. Espiridion was seen today for shoulder pain and medication refill.  Diagnoses and all orders for this visit:  Acute pain of left shoulder We discussed possible etiologies. Rotator cuff maneuvers negative. ?  OA vs referred pain from upper back. No history of trauma, so I do not think imaging is needed at this time. Sample of topical diclofenac given.  -     diclofenac sodium (VOLTAREN) 1 % GEL; Apply 4 g topically 4 (four) times daily.  Essential hypertension BP adequately controlled. Continue  Lisinopril 20 mg daily and HCTZ 25 mg daily. Recommend monitoring BP at home. Low-salt diet to continue. Continue following with PCP.  -     lisinopril (ZESTRIL) 20 MG tablet; Take 1 tablet (20 mg total) by mouth daily.  Major depressive disorder, recurrent, in remission ([place]) He does not feel a problem is well controlled. For now no changes in citalopram 40 mg. Amitriptyline 25 mg added today. Instructed about warning signs. Psychotherapy treatment recommended. Follow-up with PCP in 4 to 5 weeks.  -     citalopram (CELEXA) 40 MG tablet; TAKE 1 TABLET BY MOUTH ONCE DAILY ( PATIENT NEEDS APPOINTMENT ) -     amitriptyline (ELAVIL) 25 MG tablet; Take 1 tablet (25 mg total) by mouth at  bedtime.  Recurrent occipital headache Chronic. ?  Tension headache. Neurologic evaluation today does not suggest a serious process. I do not think head imaging is needed at this time. Instructed about warning signs. Amitriptyline 25 mg to take 1/2 tablet at bedtime recommended, some side effect discussed. Follow-up in 4 to 5 weeks with PCP, before if needed.  -     amitriptyline (ELAVIL) 25 MG tablet; Take 1 tablet (25 mg total) by mouth at bedtime.  Upper back pain on left side He took methocarbamol in the past and tolerated it well. Local massage and heat/ice. PT can be considered if problem is persistent. F/U with PCP in 4 weeks.  -     methocarbamol (ROBAXIN) 500 MG tablet; Take 1 tablet (500 mg total) by mouth every 6 (six) hours as needed for muscle spasms.    Return in about 4 weeks (around 07/03/2019) for HA,mood,pain with pcp.   Betty G. Martinique, MD  [facility]. [place] office.

## 2019-06-14 NOTE — Progress Notes (Signed)
Patient is scheduled for October 15

## 2019-06-23 ENCOUNTER — Encounter (HOSPITAL_COMMUNITY): Payer: Self-pay

## 2019-06-23 ENCOUNTER — Ambulatory Visit (HOSPITAL_COMMUNITY)
Admission: EM | Admit: 2019-06-23 | Discharge: 2019-06-23 | Disposition: A | Discharge status: Home / Self Care | Payer: Medicare HMO | Attending: Physician Assistant | Admitting: Physician Assistant

## 2019-06-23 ENCOUNTER — Other Ambulatory Visit: Payer: Self-pay

## 2019-06-23 DIAGNOSIS — S46912A Strain of unspecified muscle, fascia and tendon at shoulder and upper arm level, left arm, initial encounter: Secondary | ICD-10-CM | POA: Diagnosis not present

## 2019-06-23 MED ORDER — PREDNISONE 10 MG (21) PO TBPK: ORAL_TABLET | ORAL | 0 refills | Status: DC

## 2019-06-23 NOTE — ED Provider Notes (Signed)
[place]    CSN: [id number] Arrival date & time: 06/23/19  1749      History   Chief Complaint Chief Complaint  Patient presents with  . Arm Pain    HPI Gary Clark is a 48 y.o. male.   Patient here concerned with L shoulder and arm pain x 6 weeks.  No known injury, fall, or trauma, leads sedentary lifestyle.  Saw PCP several weeks ago, dx of "muscle strain", started on Robaxin and ibuprofen, which he took and did not improve pain.  Pain radiates down L arm, with numbness in digits 2 - 5.  Pain is sharp in character, waxes and wanes, worse with certain movements (but worsening overall) - most recently when reaching down to pick something up.       Past Medical History:  Diagnosis Date  . Allergy   . Anxiety   . Arthritis   . Asthma    years ago  . Back pain   . Blood in stool   . Cluster headache   . Depression   . Headache(784.0)    frequent   . History of kidney stones    passed 2 times  . Hyperlipidemia   . Hypertension   . Lumbosacral spondylosis without myelopathy 06/15/2013  . Migraines     10/27/2018- history of. Not current  . Postlaminectomy syndrome, lumbar region 03/08/2015   Status post L4-L5 decompression and fusion, anterior/posterior in 2011   . Pre-diabetes    pre diabetic per his PCP  . Sleep apnea    tested in 2014 positive   . Stroke [facility]) 2008   pt states "mini-Stroke"-   . UTI (urinary tract infection)     Patient Active Problem List   Diagnosis Date Noted  . S/P lumbar fusion 11/04/2018  . Degeneration of lumbar intervertebral disc 10/27/2017  . History of lumbar fusion 10/27/2017  . Back pain 10/01/2016  . Lumbar radiculitis 07/17/2016  . Lumbosacral radiculitis 05/19/2016  . Patellofemoral syndrome of both knees 05/19/2016  . Sacroiliac joint dysfunction of both sides 02/04/2016  . Obesity 06/03/2015  . Mild intermittent asthma 06/03/2015  . Major depressive disorder, recurrent, in remission ([place]) 06/03/2015  .  Postlaminectomy syndrome, lumbar region 03/08/2015  . Migraine/Cluster HAs 10/14/2012  . Chronic low back pain - hx spinal stenosis, decompression surgery in 2010, 2011 10/14/2012  . Hyperlipemia 10/14/2012  . Hypertension 10/14/2012    Past Surgical History:  Procedure Laterality Date  . ANTERIOR AND POSTERIOR SPINAL FUSION  2011  . ANTERIOR LAT LUMBAR FUSION N/A 10/01/2016   Procedure: XLIF L3-4 ANTERIOR LATERAL LUMBAR FUSION 1 LEVEL;  Surgeon: Melina Schools, MD;  Location: [place];  Service: Orthopedics;  Laterality: N/A;  . BACK SURGERY    . HARDWARE REMOVAL N/A 10/01/2016   Procedure: Removal of pedical screws L4-5 HARDWARE REMOVAL;  Surgeon: Melina Schools, MD;  Location: [place];  Service: Orthopedics;  Laterality: N/A;  . LUMBAR LAMINECTOMY/DECOMPRESSION MICRODISCECTOMY  2010       Home Medications    Prior to Admission medications   Medication Sig Start Date End Date Taking? Authorizing Provider  methocarbamol (ROBAXIN) 500 MG tablet Take 1 tablet (500 mg total) by mouth every 6 (six) hours as needed for muscle spasms. 06/05/19  Yes Martinique, Betty G, MD  amitriptyline (ELAVIL) 25 MG tablet Take 1 tablet (25 mg total) by mouth at bedtime. 06/05/19   Martinique, Betty G, MD  citalopram (CELEXA) 40 MG tablet TAKE 1 TABLET BY  MOUTH ONCE DAILY ( PATIENT NEEDS APPOINTMENT ) 06/05/19   Swazil[place]Jordan, Betty G, MD  diclofenac sodium (VOLTAREN) 1 % GEL Apply 4 g topically 4 (four) times daily. 06/05/19   [place], Betty G, MD  gabapentin (NEURONTIN) 300 MG capsule Take 300 mg by mouth 3 (three) times daily. 12/20/18   [provider]  hydrochlorothiazide (HYDRODIURIL) 25 MG tablet Take 1 tablet (25 mg total) by mouth daily. 09/13/17   Kriste BasqueKim, Hannah R, DO  ipratropium (ATROVENT) 0.06 % nasal spray Place 2 sprays into both nostrils 4 (four) times daily. 11/19/17   Ardith DarkParker, Caleb M, MD  lisinopril (ZESTRIL) 20 MG tablet Take 1 tablet (20 mg total) by mouth daily. 06/05/19   [place]andJordan, Betty G, MD  oxyCODONE 10 MG  TABS Take 1 tablet (10 mg total) by mouth every 4 (four) hours as needed for severe pain ((score 7 to 10)). Patient not taking: Reported on 06/05/2019 11/05/18   Tia AlertJones, David S, MD  predniSONE (STERAPRED UNI-PAK 21 TAB) 10 MG (21) TBPK tablet Follow direction on packaging. 06/23/19   Evern CoreLindquist, Haidy Kackley, PA-C  rosuvastatin (CRESTOR) 40 MG tablet Take 1 tablet (40 mg total) by mouth daily. 10/11/18   Terressa KoyanagiKim, Hannah R, DO    Family History Family History  Problem Relation Age of Onset  . Heart disease Mother   . Hypertension Mother   . Arthritis Father   . Hypertension Father   . Prostate cancer Father   . Miscarriages / Stillbirths Maternal Grandmother   . Heart disease Maternal Grandfather   . Sudden death Maternal Grandfather   . Breast cancer Sister   . Migraines Sister     Social History Social History   Tobacco Use  . Smoking status: Never Smoker  . Smokeless tobacco: Never Used  Substance Use Topics  . Alcohol use: Yes    Alcohol/week: 10.0 standard drinks    Types: 10 Cans of beer per week  . Drug use: No     Allergies   Codeine   Review of Systems Review of Systems  Constitutional: Negative for activity change, diaphoresis and fatigue.  Respiratory: Negative for chest tightness, shortness of breath and wheezing.   Cardiovascular: Negative for chest pain and palpitations.  Gastrointestinal: Negative for diarrhea and nausea.  Musculoskeletal: Positive for arthralgias, myalgias and neck pain. Negative for gait problem, joint swelling and neck stiffness.  Skin: Negative for color change and rash.  Neurological: Positive for numbness. Negative for weakness and headaches.  Hematological: Negative for adenopathy. Does not bruise/bleed easily.  Psychiatric/Behavioral: Positive for sleep disturbance. Negative for confusion.     Physical Exam Triage Vital Signs ED Triage Vitals  Enc Vitals Group     BP 06/23/19 1811 (!) 134/92     Pulse Rate 06/23/19 1811 96     Resp  06/23/19 1811 17     Temp 06/23/19 1811 98.7 F (37.1 C)     Temp Source 06/23/19 1811 Oral     SpO2 06/23/19 1811 100 %     Weight --      Height --      Head Circumference --      Peak Flow --      Pain Score 06/23/19 1810 6     Pain Loc --      Pain Edu? --      Excl. in GC? --    No data found.  Updated Vital Signs BP (!) 134/92 (BP Location: Left Arm)   Pulse 96  Temp 98.7 F (37.1 C) (Oral)   Resp 17   SpO2 100%   Visual Acuity Right Eye Distance:   Left Eye Distance:   Bilateral Distance:    Right Eye Near:   Left Eye Near:    Bilateral Near:     Physical Exam Vitals signs and nursing note reviewed.  Constitutional:      General: He is not in acute distress.    Appearance: Normal appearance. He is well-developed. He is not ill-appearing or toxic-appearing.  HENT:     Head: Normocephalic and atraumatic.     Nose: Nose normal.  Eyes:     Conjunctiva/sclera: Conjunctivae normal.     Pupils: Pupils are equal, round, and reactive to light.  Neck:     Musculoskeletal: Neck supple.  Cardiovascular:     Rate and Rhythm: Normal rate and regular rhythm.     Heart sounds: No murmur.  Pulmonary:     Effort: Pulmonary effort is normal. No respiratory distress.     Breath sounds: Normal breath sounds.  Musculoskeletal:     Left shoulder: He exhibits tenderness (bicipital groove) and pain. He exhibits normal range of motion, no swelling, no laceration, no spasm and normal strength.     Left wrist: He exhibits normal range of motion, no tenderness, no bony tenderness and no swelling.     Comments: Tinel's negative Phalen's negative  Skin:    General: Skin is warm and dry.     Capillary Refill: Capillary refill takes less than 2 seconds.  Neurological:     General: No focal deficit present.     Mental Status: He is alert and oriented to person, place, and time.     Motor: No weakness.     Gait: Gait normal.  Psychiatric:        Mood and Affect: Mood normal.         Behavior: Behavior normal.      UC Treatments / Results  Labs (all labs ordered are listed, but only abnormal results are displayed) Labs Reviewed - No data to display  EKG   Radiology No results found.  Procedures Procedures (including critical care time)  Medications Ordered in UC Medications - No data to display  Initial Impression / Assessment and Plan / UC Course  I have reviewed the triage vital signs and the nursing notes.  Pertinent labs & imaging results that were available during my care of the patient were reviewed by me and considered in my medical decision making (see chart for details).      Final Clinical Impressions(s) / UC Diagnoses   Final diagnoses:  Shoulder strain, left, initial encounter     Discharge Instructions     Take medication as prescribed. Follow up with PCP if no improvement after 1 week for possible referral to physical therapy or ortho for further management. Ice shoulder 15 minutes 4 times per day. Do shoulder exercises daily. Stretch shoulder daily.     ED Prescriptions    Medication Sig Dispense Auth. Provider   predniSONE (STERAPRED UNI-PAK 21 TAB) 10 MG (21) TBPK tablet Follow direction on packaging. 21 tablet Evern Core, PA-C     PDMP not reviewed this encounter.   Evern Core, PA-C 06/23/19 1948

## 2019-06-23 NOTE — ED Triage Notes (Signed)
Patient presents to Urgent Care with complaints of left arm pain worse with certain movements or positions since about a week ago. Patient reports he "doesn't really do much" and his PCP thinks he pulled a muscle but he feels like it is something else.

## 2019-06-23 NOTE — Discharge Instructions (Addendum)
Take medication as prescribed. Follow up with PCP if no improvement after 1 week for possible referral to physical therapy or ortho for further management. Ice shoulder 15 minutes 4 times per day. Do shoulder exercises daily. Stretch shoulder daily.

## 2019-06-29 ENCOUNTER — Ambulatory Visit: Payer: Medicare HMO | Admitting: Internal Medicine

## 2019-07-18 ENCOUNTER — Other Ambulatory Visit: Payer: Self-pay

## 2019-07-18 ENCOUNTER — Ambulatory Visit (INDEPENDENT_AMBULATORY_CARE_PROVIDER_SITE_OTHER): Payer: Medicare HMO | Admitting: Internal Medicine

## 2019-07-18 NOTE — Progress Notes (Signed)
Virtual Visit via Video Note  I connected with Gary Clark on 07/18/19 at 10:30 AM EST by a video enabled telemedicine application and verified that I am speaking with the correct person using two identifiers.  Location patient: home Location provider: work office Persons participating in the virtual visit: patient, provider  I discussed the limitations of evaluation and management by telemedicine and the availability of in person appointments. The patient expressed understanding and agreed to proceed.   HPI: He has scheduled this visit as he would like to discuss weight loss. He has tried diet and exercise but it is not working for him. He has tried phentermine in the past but has issues with it increasing his HR. He would like to know what he can do next.   ROS: Constitutional: Denies fever, chills, diaphoresis, appetite change and fatigue.  HEENT: Denies photophobia, eye pain, redness, hearing loss, ear pain, congestion, sore throat, rhinorrhea, sneezing, mouth sores, trouble swallowing, neck pain, neck stiffness and tinnitus.   Respiratory: Denies SOB, DOE, cough, chest tightness,  and wheezing.   Cardiovascular: Denies chest pain, palpitations and leg swelling.  Gastrointestinal: Denies nausea, vomiting, abdominal pain, diarrhea, constipation, blood in stool and abdominal distention.  Genitourinary: Denies dysuria, urgency, frequency, hematuria, flank pain and difficulty urinating.  Endocrine: Denies: hot or cold intolerance, sweats, changes in hair or nails, polyuria, polydipsia. Musculoskeletal: Denies myalgias, back pain, joint swelling, arthralgias and gait problem.  Skin: Denies pallor, rash and wound.  Neurological: Denies dizziness, seizures, syncope, weakness, light-headedness, numbness and headaches.  Hematological: Denies adenopathy. Easy bruising, personal or family bleeding history  Psychiatric/Behavioral: Denies suicidal ideation, mood changes, confusion,  nervousness, sleep disturbance and agitation   Past Medical History:  Diagnosis Date  . Allergy   . Anxiety   . Arthritis   . Asthma    years ago  . Back pain   . Blood in stool   . Cluster headache   . Depression   . Headache(784.0)    frequent   . History of kidney stones    passed 2 times  . Hyperlipidemia   . Hypertension   . Lumbosacral spondylosis without myelopathy 06/15/2013  . Migraines     10/27/2018- history of. Not current  . Postlaminectomy syndrome, lumbar region 03/08/2015   Status post L4-L5 decompression and fusion, anterior/posterior in 2011   . Pre-diabetes    pre diabetic per his PCP  . Sleep apnea    tested in 2014 positive   . Stroke [facility]) 2008   pt states "mini-Stroke"-   . UTI (urinary tract infection)     Past Surgical History:  Procedure Laterality Date  . ANTERIOR AND POSTERIOR SPINAL FUSION  2011  . ANTERIOR LAT LUMBAR FUSION N/A 10/01/2016   Procedure: XLIF L3-4 ANTERIOR LATERAL LUMBAR FUSION 1 LEVEL;  Surgeon: Venita Lick, MD;  Location: [facility] OR;  Service: Orthopedics;  Laterality: N/A;  . BACK SURGERY    . HARDWARE REMOVAL N/A 10/01/2016   Procedure: Removal of pedical screws L4-5 HARDWARE REMOVAL;  Surgeon: Venita Lick, MD;  Location: [facility] OR;  Service: Orthopedics;  Laterality: N/A;  . LUMBAR LAMINECTOMY/DECOMPRESSION MICRODISCECTOMY  2010    Family History  Problem Relation Age of Onset  . Heart disease Mother   . Hypertension Mother   . Arthritis Father   . Hypertension Father   . Prostate cancer Father   . Miscarriages / Stillbirths Maternal Grandmother   . Heart disease Maternal Grandfather   . Sudden death  Maternal Grandfather   . Breast cancer Sister   . Migraines Sister     SOCIAL HX:   reports that he has never smoked. He has never used smokeless tobacco. He reports current alcohol use of about 10.0 standard drinks of alcohol per week. He reports that he does not use drugs.   Current Outpatient Medications:  .   amitriptyline (ELAVIL) 25 MG tablet, Take 1 tablet (25 mg total) by mouth at bedtime., Disp: 30 tablet, Rfl: 1 .  citalopram (CELEXA) 40 MG tablet, TAKE 1 TABLET BY MOUTH ONCE DAILY ( PATIENT NEEDS APPOINTMENT ), Disp: 30 tablet, Rfl: 1 .  diclofenac sodium (VOLTAREN) 1 % GEL, Apply 4 g topically 4 (four) times daily., Disp: 100 g, Rfl: 0 .  gabapentin (NEURONTIN) 300 MG capsule, Take 300 mg by mouth 3 (three) times daily., Disp: , Rfl:  .  hydrochlorothiazide (HYDRODIURIL) 25 MG tablet, Take 1 tablet (25 mg total) by mouth daily., Disp: 90 tablet, Rfl: 3 .  ipratropium (ATROVENT) 0.06 % nasal spray, Place 2 sprays into both nostrils 4 (four) times daily., Disp: 15 mL, Rfl: 0 .  lisinopril (ZESTRIL) 20 MG tablet, Take 1 tablet (20 mg total) by mouth daily., Disp: 90 tablet, Rfl: 0 .  methocarbamol (ROBAXIN) 500 MG tablet, Take 1 tablet (500 mg total) by mouth every 6 (six) hours as needed for muscle spasms., Disp: 60 tablet, Rfl: 1 .  oxyCODONE 10 MG TABS, Take 1 tablet (10 mg total) by mouth every 4 (four) hours as needed for severe pain ((score 7 to 10)). (Patient not taking: Reported on 06/05/2019), Disp: 40 tablet, Rfl: 0 .  predniSONE (STERAPRED UNI-PAK 21 TAB) 10 MG (21) TBPK tablet, Follow direction on packaging., Disp: 21 tablet, Rfl: 0 .  rosuvastatin (CRESTOR) 40 MG tablet, Take 1 tablet (40 mg total) by mouth daily., Disp: 90 tablet, Rfl: 3  EXAM:   VITALS per patient if applicable: none reported  GENERAL: alert, oriented, appears well and in no acute distress  HEENT: atraumatic, conjunttiva clear, no obvious abnormalities on inspection of external nose and ears  NECK: normal movements of the head and neck  LUNGS: on inspection no signs of respiratory distress, breathing rate appears normal, no obvious gross increased work of breathing, gasping or wheezing  CV: no obvious cyanosis  MS: moves all visible extremities without noticeable abnormality  PSYCH/NEURO: pleasant and  cooperative, no obvious depression or anxiety, speech and thought processing grossly intact  ASSESSMENT AND PLAN:   Morbid obesity ([place]) -Per patient preference, will refer to healthy weight and wellness clinic.    I discussed the assessment and treatment plan with the patient. The patient was provided an opportunity to ask questions and all were answered. The patient agreed with the plan and demonstrated an understanding of the instructions.   The patient was advised to call back or seek an in-person evaluation if the symptoms worsen or if the condition fails to improve as anticipated.    Lelon Frohlich, MD  [facility] at [facility]

## 2019-07-21 ENCOUNTER — Other Ambulatory Visit: Payer: Self-pay | Admitting: Internal Medicine

## 2019-09-25 ENCOUNTER — Other Ambulatory Visit: Payer: Self-pay | Admitting: Family Medicine

## 2019-09-25 DIAGNOSIS — F3340 Major depressive disorder, recurrent, in remission, unspecified: Secondary | ICD-10-CM

## 2019-10-24 ENCOUNTER — Telehealth: Payer: Self-pay | Admitting: Internal Medicine

## 2019-10-24 DIAGNOSIS — F3340 Major depressive disorder, recurrent, in remission, unspecified: Secondary | ICD-10-CM

## 2019-10-24 DIAGNOSIS — I10 Essential (primary) hypertension: Secondary | ICD-10-CM

## 2019-10-24 MED ORDER — LISINOPRIL 20 MG PO TABS: 20.0000 mg | ORAL_TABLET | Freq: Every day | ORAL | 0 refills | Status: DC

## 2019-10-24 MED ORDER — CITALOPRAM HYDROBROMIDE 40 MG PO TABS: ORAL_TABLET | ORAL | 0 refills | Status: DC

## 2019-10-24 MED ORDER — HYDROCHLOROTHIAZIDE 25 MG PO TABS: 25.0000 mg | ORAL_TABLET | Freq: Every day | ORAL | 0 refills | Status: DC

## 2019-10-24 NOTE — Telephone Encounter (Signed)
Refills sent

## 2019-10-24 NOTE — Telephone Encounter (Signed)
Patient needs refills on 3 medications before he goes out of town this weekend  citalopram (CELEXA) 40 MG tablet  hydrochlorothiazide (HYDRODIURIL) 25 MG tablet  lisinopril (ZESTRIL) 20 MG tablet    [id number] - [place] ([place]), [place] - [address] Phone:  [phone]  Fax:  [phone]

## 2019-10-24 NOTE — Addendum Note (Signed)
Addended by: Kern Reap B on: 10/24/2019 03:34 PM   Modules accepted: Orders

## 2019-12-23 ENCOUNTER — Emergency Department (HOSPITAL_COMMUNITY): Payer: Medicare HMO

## 2019-12-23 ENCOUNTER — Encounter (HOSPITAL_COMMUNITY): Payer: Self-pay | Admitting: *Deleted

## 2019-12-23 ENCOUNTER — Other Ambulatory Visit: Payer: Self-pay

## 2019-12-23 ENCOUNTER — Emergency Department (HOSPITAL_COMMUNITY)
Admission: EM | Admit: 2019-12-23 | Discharge: 2019-12-23 | Disposition: A | Discharge status: Home / Self Care | Payer: Medicare HMO | Attending: Emergency Medicine | Admitting: Emergency Medicine

## 2019-12-23 DIAGNOSIS — Z20822 Contact with and (suspected) exposure to covid-19: Secondary | ICD-10-CM | POA: Diagnosis not present

## 2019-12-23 DIAGNOSIS — R05 Cough: Secondary | ICD-10-CM | POA: Diagnosis not present

## 2019-12-23 DIAGNOSIS — J029 Acute pharyngitis, unspecified: Secondary | ICD-10-CM | POA: Insufficient documentation

## 2019-12-23 DIAGNOSIS — J069 Acute upper respiratory infection, unspecified: Secondary | ICD-10-CM | POA: Diagnosis not present

## 2019-12-23 DIAGNOSIS — I10 Essential (primary) hypertension: Secondary | ICD-10-CM | POA: Diagnosis not present

## 2019-12-23 DIAGNOSIS — B9789 Other viral agents as the cause of diseases classified elsewhere: Secondary | ICD-10-CM | POA: Diagnosis not present

## 2019-12-23 LAB — RESPIRATORY PANEL BY RT PCR (FLU A&B, COVID)
Influenza A by PCR: NEGATIVE
Influenza B by PCR: NEGATIVE
SARS Coronavirus 2 by RT PCR: NEGATIVE

## 2019-12-23 NOTE — Discharge Instructions (Addendum)
It was our pleasure to provide your ER care today - we hope that you feel better.  Rest. Drink plenty of fluids.   Your chest xray looks good - no pneumonia or bronchitis noted. Your symptoms are most consistent with a viral illness which should run its course and get better in the next week. Your covid test remains pending and should result in the next 2-3 hours - your may check My Chart or call for results - see covid information.   You may try OTC cough/congestion, or zyrtec-d, or claritin-d as need for symptom relief.   Follow up with primary care doctor in 1 week if symptoms fail to improve/resolve. Also, your blood pressure is high today - continue your medication and follow up with your doctor in the next couple weeks.   Return to ER if worse, new symptoms, increased trouble breathing, oro there concern.

## 2019-12-23 NOTE — ED Notes (Signed)
Called name in lobby X3 no answer  

## 2019-12-23 NOTE — ED Triage Notes (Signed)
Pt says that he was working the yard a couple of days ago and since then has had a cough, headache, and sinus congestion. Has taken tylenol for the same.

## 2019-12-23 NOTE — ED Provider Notes (Signed)
[facility] DEPT Provider Note   CSN: [id number] Arrival date & time: 12/23/19  1927     History Chief Complaint  Patient presents with  . Cough    Gary Clark is a 49 y.o. male.  Patient c/o 3-4 day hx non prod cough, scratchy throat, nasal congestion, frontal sinus area soreness. Symptoms acute onset, moderate, persistent.   Denies hx chr lung disease or asthma. Non smoker. No specific known ill contacts or known covid + exposure. No fevers. No body aches. Denies severe headache. No neck pain or stiffness. No trouble breathing or swallowing. Hx 'allergies' but denies severe or same symptoms in fall and/or spring. No recent abx use.   The history is provided by the patient.  Cough Associated symptoms: rhinorrhea and sore throat   Associated symptoms: no chest pain, no fever, no headaches, no rash and no shortness of breath        Past Medical History:  Diagnosis Date  . Allergy   . Anxiety   . Arthritis   . Asthma    years ago  . Back pain   . Blood in stool   . Cluster headache   . Depression   . Headache(784.0)    frequent   . History of kidney stones    passed 2 times  . Hyperlipidemia   . Hypertension   . Lumbosacral spondylosis without myelopathy 06/15/2013  . Migraines     10/27/2018- history of. Not current  . Postlaminectomy syndrome, lumbar region 03/08/2015   Status post L4-L5 decompression and fusion, anterior/posterior in 2011   . Pre-diabetes    pre diabetic per his PCP  . Sleep apnea    tested in 2014 positive   . Stroke [facility]) 2008   pt states "mini-Stroke"-   . UTI (urinary tract infection)     Patient Active Problem List   Diagnosis Date Noted  . S/P lumbar fusion 11/04/2018  . Degeneration of lumbar intervertebral disc 10/27/2017  . History of lumbar fusion 10/27/2017  . Back pain 10/01/2016  . Lumbar radiculitis 07/17/2016  . Lumbosacral radiculitis 05/19/2016  . Patellofemoral syndrome of both knees  05/19/2016  . Sacroiliac joint dysfunction of both sides 02/04/2016  . Obesity 06/03/2015  . Mild intermittent asthma 06/03/2015  . 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 Postlaminectomy syndrome, lumbar region 03/08/2015  . Migraine/Cluster HAs 10/14/2012  . Chronic low back pain - hx spinal stenosis, decompression surgery in 2010, 2011 10/14/2012  . Hyperlipemia 10/14/2012  . Hypertension 10/14/2012    Past Surgical History:  Procedure Laterality Date  . ANTERIOR AND POSTERIOR SPINAL FUSION  2011  . ANTERIOR LAT LUMBAR FUSION N/A 10/01/2016   Procedure: XLIF L3-4 ANTERIOR LATERAL LUMBAR FUSION 1 LEVEL;  Surgeon: Melina Schools, MD;  Location: [facility];  Service: Orthopedics;  Laterality: N/A;  . BACK SURGERY    . HARDWARE REMOVAL N/A 10/01/2016   Procedure: Removal of pedical screws L4-5 HARDWARE REMOVAL;  Surgeon: Melina Schools, MD;  Location: [facility];  Service: Orthopedics;  Laterality: N/A;  . LUMBAR LAMINECTOMY/DECOMPRESSION MICRODISCECTOMY  2010       Family History  Problem Relation Age of Onset  . Heart disease Mother   . Hypertension Mother   . Arthritis Father   . Hypertension Father   . Prostate cancer Father   . Miscarriages / Stillbirths Maternal Grandmother   . Heart disease Maternal Grandfather   . Sudden death Maternal Grandfather   . Breast  cancer Sister   . Migraines Sister     Social History   Tobacco Use  . Smoking status: Never Smoker  . Smokeless tobacco: Never Used  Substance Use Topics  . Alcohol use: Yes    Alcohol/week: 10.0 standard drinks    Types: 10 Cans of beer per week  . Drug use: No    Home Medications Prior to Admission medications   Medication Sig Start Date End Date Taking? Authorizing Provider  amitriptyline (ELAVIL) 25 MG tablet Take 1 tablet (25 mg total) by mouth at bedtime. 06/05/19   [place], Betty G, MD  citalopram (CELEXA) 40 MG tablet TAKE 1 TABLET BY MOUTH ONCE DAILY (PATIENT NEEDS  APPOINTMENT). 10/24/19   Philip Aspen, Limmie Patricia, MD  diclofenac sodium (VOLTAREN) 1 % GEL Apply 4 g topically 4 (four) times daily. 06/05/19   [place], Betty G, MD  gabapentin (NEURONTIN) 300 MG capsule Take 300 mg by mouth 3 (three) times daily. 12/20/18   [provider]  hydrochlorothiazide (HYDRODIURIL) 25 MG tablet Take 1 tablet (25 mg total) by mouth daily. 10/24/19   Philip Aspen, Limmie Patricia, MD  ipratropium (ATROVENT) 0.06 % nasal spray Place 2 sprays into both nostrils 4 (four) times daily. 11/19/17   Ardith Dark, MD  lisinopril (ZESTRIL) 20 MG tablet Take 1 tablet (20 mg total) by mouth daily. 10/24/19   Philip Aspen, Limmie Patricia, MD  methocarbamol (ROBAXIN) 500 MG tablet Take 1 tablet (500 mg total) by mouth every 6 (six) hours as needed for muscle spasms. 06/05/19   [place], Betty G, MD  oxyCODONE 10 MG TABS Take 1 tablet (10 mg total) by mouth every 4 (four) hours as needed for severe pain ((score 7 to 10)). Patient not taking: Reported on 06/05/2019 11/05/18   Tia Alert, MD  predniSONE (STERAPRED UNI-PAK 21 TAB) 10 MG (21) TBPK tablet Follow direction on packaging. 06/23/19   Evern Core, PA-C  rosuvastatin (CRESTOR) 40 MG tablet Take 1 tablet (40 mg total) by mouth daily. 10/11/18   Terressa Koyanagi, DO    Allergies    Codeine  Review of Systems   Review of Systems  Constitutional: Negative for fever.  HENT: Positive for congestion, rhinorrhea and sore throat. Negative for trouble swallowing.   Eyes: Negative for pain, redness and visual disturbance.  Respiratory: Positive for cough. Negative for shortness of breath.   Cardiovascular: Negative for chest pain.  Gastrointestinal: Negative for abdominal pain, diarrhea and vomiting.  Genitourinary: Negative for flank pain.  Musculoskeletal: Negative for neck pain and neck stiffness.  Skin: Negative for rash.  Neurological: Negative for headaches.  Hematological: Does not bruise/bleed easily.  Psychiatric/Behavioral:  Negative for confusion.    Physical Exam Updated Vital Signs BP (!) 158/99 (BP Location: Left Arm)   Pulse 75   Temp 98.3 F (36.8 C)   Resp 18   Ht 1.676 m (5\' 6" )   Wt 99.8 kg   SpO2 95%   BMI 35.51 kg/m   Physical Exam Vitals and nursing note reviewed.  Constitutional:      Appearance: Normal appearance. He is well-developed.  HENT:     Head: Atraumatic.     Comments: Mild frontal sinus tenderness.     Right Ear: Tympanic membrane normal.     Left Ear: Tympanic membrane normal.     Nose: Congestion present.     Mouth/Throat:     Mouth: Mucous membranes are moist.     Pharynx: Oropharynx is clear. No oropharyngeal  exudate or posterior oropharyngeal erythema.  Eyes:     General: No scleral icterus.    Conjunctiva/sclera: Conjunctivae normal.     Pupils: Pupils are equal, round, and reactive to light.  Neck:     Trachea: No tracheal deviation.     Comments: No stiffness or rigidity.  Cardiovascular:     Rate and Rhythm: Normal rate and regular rhythm.     Pulses: Normal pulses.     Heart sounds: Normal heart sounds. No murmur. No friction rub. No gallop.   Pulmonary:     Effort: Pulmonary effort is normal. No accessory muscle usage or respiratory distress.     Breath sounds: Normal breath sounds.     Comments: + non prod cough.  Abdominal:     General: Bowel sounds are normal. There is no distension.     Palpations: Abdomen is soft.     Tenderness: There is no abdominal tenderness. There is no guarding.  Genitourinary:    Comments: No cva tenderness. Musculoskeletal:        General: No swelling.     Cervical back: Normal range of motion and neck supple. No rigidity.     Right lower leg: No edema.     Left lower leg: No edema.  Skin:    General: Skin is warm and dry.     Findings: No rash.  Neurological:     Mental Status: He is alert.     Comments: Alert, speech clear. Steady gait.  Psychiatric:        Mood and Affect: Mood normal.     ED Results /  Procedures / Treatments   Labs (all labs ordered are listed, but only abnormal results are displayed) Labs Reviewed  RESPIRATORY PANEL BY RT PCR (FLU A&B, COVID)    EKG None  Radiology DG Chest 2 View  Result Date: 12/23/2019 CLINICAL DATA:  Cough. EXAM: CHEST - 2 VIEW COMPARISON:  October 27, 2018 FINDINGS: The heart, hila, and mediastinum are normal. No pneumothorax. No nodules or masses. No focal infiltrates. IMPRESSION: No active cardiopulmonary disease. Electronically Signed   By: Gerome Sam III M.D   On: 12/23/2019 21:10    Procedures Procedures (including critical care time)  Medications Ordered in ED Medications - No data to display  ED Course  I have reviewed the triage vital signs and the nursing notes.  Pertinent labs & imaging results that were available during my care of the patient were reviewed by me and considered in my medical decision making (see chart for details).    MDM Rules/Calculators/A&P                     CXR.   Pt indicates would like covid test. covid test sent.   Gary Clark was evaluated in Emergency Department on 12/23/2019 for the symptoms described in the history of present illness. He was evaluated in the context of the global COVID-19 pandemic, which necessitated consideration that the patient might be at risk for infection with the SARS-CoV-2 virus that causes COVID-19. Institutional protocols and algorithms that pertain to the evaluation of patients at risk for COVID-19 are in a state of rapid change based on information released by regulatory bodies including the CDC and federal and state organizations. These policies and algorithms were followed during the patient's care in the ED.  CXR reviewed/interpreted by me - no pna.   Symptoms/exam felt most c/w viral uri.   rec otc sinus/cold medication for symptom  relief.   Discussed covid test will result in 2-3 hours.   Patient currently breathing comfortably. No distress. Pt  appears stable for d/c.   Return precautions provided.    Final Clinical Impression(s) / ED Diagnoses Final diagnoses:  None    Rx / DC Orders ED Discharge Orders    None       Cathren Laine, MD 12/23/19 2225

## 2019-12-23 NOTE — ED Notes (Signed)
Pt states desire to leave. MD made aware

## 2019-12-23 NOTE — ED Notes (Signed)
Pt verbalizes understanding of DC instructions. Pt belongings returned and is ambulatory out of ED.  

## 2019-12-27 ENCOUNTER — Encounter: Payer: Self-pay | Admitting: Family Medicine

## 2019-12-27 ENCOUNTER — Telehealth (INDEPENDENT_AMBULATORY_CARE_PROVIDER_SITE_OTHER): Payer: Medicare HMO | Admitting: Family Medicine

## 2019-12-27 VITALS — BP 127/97 | HR 73 | Ht 66.0 in

## 2019-12-27 DIAGNOSIS — I10 Essential (primary) hypertension: Secondary | ICD-10-CM | POA: Diagnosis not present

## 2019-12-27 DIAGNOSIS — R059 Cough, unspecified: Secondary | ICD-10-CM

## 2019-12-27 DIAGNOSIS — J069 Acute upper respiratory infection, unspecified: Secondary | ICD-10-CM

## 2019-12-27 DIAGNOSIS — R05 Cough: Secondary | ICD-10-CM

## 2019-12-27 MED ORDER — FLUTICASONE PROPIONATE 50 MCG/ACT NA SUSP: 2.0000 | Freq: Every day | NASAL | 3 refills | Status: DC

## 2019-12-27 MED ORDER — HYDROCODONE-HOMATROPINE 5-1.5 MG/5ML PO SYRP: 5.0000 mL | ORAL_SOLUTION | Freq: Two times a day (BID) | ORAL | 0 refills | Status: AC | PRN

## 2019-12-27 NOTE — Progress Notes (Signed)
Virtual Visit via Video Note   I connected with Gary Clark on 12/27/19 by a video enabled telemedicine application and verified that I am speaking with the correct person using two identifiers.  Location patient: home Location provider:work office Persons participating in the virtual visit: patient, provider  I discussed the limitations of evaluation and management by telemedicine and the availability of in person appointments. The patient expressed understanding and agreed to proceed.  Chief Complaint  Patient presents with  . Cough  . Head Congestion    HPI: Gary Clark is a 49 year old male with history of allergies, asthma, hypertension, chronic pain, and anxiety complaining of persistent cough that is worse at night and affecting his sleep. "Sinus congestion", nasal congestion, rhinorrhea, postnasal drainage, sore throat, cough, and occasional wheezing at night associated with cough. Negative for fever, chills, unusual body aches, anosmia,ageusia, dyspnea, abdominal pain, N/V, or a skin rash.  His son has had respiratory symptoms but attributed to "allergies." No recent travel.  He was evaluated in the ED on 12/23/2019. COVID 19 test negative.  Symptoms have been stable.  CXR: No focal infiltrates, no active cardiopulmonary disease. BP was elevated at 158/90. His wife has been checking BP for the past couple days,DBP mildly elevated. HTN on HCTZ 25 mg daily and lisinopril 20 mg daily.  Lab Results  Component Value Date   CREATININE 1.04 10/27/2018   BUN 17 10/27/2018   NA 141 10/27/2018   K 4.3 10/27/2018   CL 103 10/27/2018   CO2 25 10/27/2018    ROS: See pertinent positives and negatives per HPI.  Past Medical History:  Diagnosis Date  . Allergy   . Anxiety   . Arthritis   . Asthma    years ago  . Back pain   . Blood in stool   . Cluster headache   . Depression   . Headache(784.0)    frequent   . History of kidney stones    passed 2 times  .  Hyperlipidemia   . Hypertension   . Lumbosacral spondylosis without myelopathy 06/15/2013  . Migraines     10/27/2018- history of. Not current  . Postlaminectomy syndrome, lumbar region 03/08/2015   Status post L4-L5 decompression and fusion, anterior/posterior in 2011   . Pre-diabetes    pre diabetic per his PCP  . Sleep apnea    tested in 2014 positive   . Stroke [facility]) 2008   pt states "mini-Stroke"-   . UTI (urinary tract infection)     Past Surgical History:  Procedure Laterality Date  . ANTERIOR AND POSTERIOR SPINAL FUSION  2011  . ANTERIOR LAT LUMBAR FUSION N/A 10/01/2016   Procedure: XLIF L3-4 ANTERIOR LATERAL LUMBAR FUSION 1 LEVEL;  Surgeon: Venita Lick, MD;  [facility];  Service: Orthopedics;  Laterality: N/A;  . BACK SURGERY    . HARDWARE REMOVAL N/A 10/01/2016   Procedure: Removal of pedical screws L4-5 HARDWARE REMOVAL;  Surgeon: Venita Lick, MD;  Location: [facility];  Service: Orthopedics;  Laterality: N/A;  . LUMBAR LAMINECTOMY/DECOMPRESSION MICRODISCECTOMY  2010    Family History  Problem Relation Age of Onset  . Heart disease Mother   . Hypertension Mother   . Arthritis Father   . Hypertension Father   . Prostate cancer Father   . Miscarriages / Stillbirths Maternal Grandmother   . Heart disease Maternal Grandfather   . Sudden death Maternal Grandfather   . Breast cancer Sister   . Migraines Sister     Social  History   Socioeconomic History  . Marital status: Married    Spouse name: [name]  . Number of children: 1  . Years of education: 12th  . Highest education level: Not on file  Occupational History    Comment: n/a  Tobacco Use  . Smoking status: Never Smoker  . Smokeless tobacco: Never Used  Substance and Sexual Activity  . Alcohol use: Yes    Alcohol/week: 10.0 standard drinks    Types: 10 Cans of beer per week  . Drug use: No  . Sexual activity: Yes    Birth control/protection: None  Other Topics Concern  . Not on file  Social  History Narrative   Patient lives at home with his family.   Caffeine Use: 2 cups daily   Social Determinants of Health   Financial Resource Strain:   . Difficulty of Paying Living Expenses:   Food Insecurity:   . Worried About Programme researcher, broadcasting/film/video in the Last Year:   . Barista in the Last Year:   Transportation Needs:   . Freight forwarder (Medical):   Marland Kitchen Lack of Transportation (Non-Medical):   Physical Activity:   . Days of Exercise per Week:   . Minutes of Exercise per Session:   Stress:   . Feeling of Stress :   Social Connections:   . Frequency of Communication with Friends and Family:   . Frequency of Social Gatherings with Friends and Family:   . Attends Religious Services:   . Active Member of Clubs or Organizations:   . Attends Banker Meetings:   Marland Kitchen Marital Status:   Intimate Partner Violence:   . Fear of Current or Ex-Partner:   . Emotionally Abused:   Marland Kitchen Physically Abused:   . Sexually Abused:     Current Outpatient Medications:  .  amitriptyline (ELAVIL) 25 MG tablet, Take 1 tablet (25 mg total) by mouth at bedtime., Disp: 30 tablet, Rfl: 1 .  citalopram (CELEXA) 40 MG tablet, TAKE 1 TABLET BY MOUTH ONCE DAILY (PATIENT NEEDS APPOINTMENT)., Disp: 90 tablet, Rfl: 0 .  diclofenac sodium (VOLTAREN) 1 % GEL, Apply 4 g topically 4 (four) times daily., Disp: 100 g, Rfl: 0 .  gabapentin (NEURONTIN) 300 MG capsule, Take 300 mg by mouth 3 (three) times daily., Disp: , Rfl:  .  hydrochlorothiazide (HYDRODIURIL) 25 MG tablet, Take 1 tablet (25 mg total) by mouth daily., Disp: 90 tablet, Rfl: 0 .  ipratropium (ATROVENT) 0.06 % nasal spray, Place 2 sprays into both nostrils 4 (four) times daily., Disp: 15 mL, Rfl: 0 .  lisinopril (ZESTRIL) 20 MG tablet, Take 1 tablet (20 mg total) by mouth daily., Disp: 90 tablet, Rfl: 0 .  methocarbamol (ROBAXIN) 500 MG tablet, Take 1 tablet (500 mg total) by mouth every 6 (six) hours as needed for muscle spasms., Disp:  60 tablet, Rfl: 1 .  oxyCODONE 10 MG TABS, Take 1 tablet (10 mg total) by mouth every 4 (four) hours as needed for severe pain ((score 7 to 10))., Disp: 40 tablet, Rfl: 0 .  predniSONE (STERAPRED UNI-PAK 21 TAB) 10 MG (21) TBPK tablet, Follow direction on packaging., Disp: 21 tablet, Rfl: 0 .  rosuvastatin (CRESTOR) 40 MG tablet, Take 1 tablet (40 mg total) by mouth daily., Disp: 90 tablet, Rfl: 3  EXAM: VITALS per patient if applicable:BP (!) 127/97   Pulse 73   Ht 5\' 6"  (1.676 m)   BMI 35.51 kg/m   GENERAL: alert, oriented,  appears well and in no acute distress  HEENT: atraumatic, conjunctiva clear, no obvious abnormalities on inspection of external nose and ears  NECK: normal movements of the head and neck  LUNGS: on inspection no signs of respiratory distress, breathing rate appears normal, no obvious gross SOB, gasping or wheezing. No cough during visit.  CV: no obvious cyanosis  MS: moves all visible extremities without noticeable abnormality  PSYCH/NEURO: pleasant and cooperative, no obvious depression or anxiety, speech and thought processing grossly intact  ASSESSMENT AND PLAN:  Discussed the following assessment and plan:  URI, acute Respiratory symptoms most likely related with viral illness, allergies can be a contributing factor. Recommend symptomatic treatment. Plain Mucinex may help. Nasal saline irrigations as needed and Flonase nasal spray. Instructed to monitor for fever or worsening symptoms.  Cough Explained that cough can last a few weeks after respiratory infection. He has taken hydrocodone in the past, no side effects. Hycodan 5 mL twice daily as needed, to take mainly at night. We discussed some side effects. Adequate hydration.  Essential hypertension BP mildly elevated. Instructed to monitor BP daily and to let us know about BP readings in about 2 weeks. No changes in current management. Follow-up with PCP.    I discussed the assessment and  treatment plan with the patient. Ms Callander was provided an opportunity to ask questions and all were answered. He agreed with the plan and demonstrated an understanding of the instructions.    Return if symptoms worsen or fail to improve.    Gary Tedesco Martinique, MD

## 2020-01-11 ENCOUNTER — Ambulatory Visit (HOSPITAL_COMMUNITY)
Admission: EM | Admit: 2020-01-11 | Discharge: 2020-01-11 | Disposition: A | Discharge status: Home / Self Care | Payer: Medicare HMO | Attending: Family Medicine | Admitting: Family Medicine

## 2020-01-11 ENCOUNTER — Encounter (HOSPITAL_COMMUNITY): Payer: Self-pay

## 2020-01-11 ENCOUNTER — Other Ambulatory Visit: Payer: Self-pay

## 2020-01-11 DIAGNOSIS — R05 Cough: Secondary | ICD-10-CM | POA: Insufficient documentation

## 2020-01-11 DIAGNOSIS — Z885 Allergy status to narcotic agent status: Secondary | ICD-10-CM | POA: Insufficient documentation

## 2020-01-11 DIAGNOSIS — G4730 Sleep apnea, unspecified: Secondary | ICD-10-CM | POA: Diagnosis not present

## 2020-01-11 DIAGNOSIS — Z8249 Family history of ischemic heart disease and other diseases of the circulatory system: Secondary | ICD-10-CM | POA: Diagnosis not present

## 2020-01-11 DIAGNOSIS — R059 Cough, unspecified: Secondary | ICD-10-CM

## 2020-01-11 DIAGNOSIS — R0982 Postnasal drip: Secondary | ICD-10-CM | POA: Diagnosis not present

## 2020-01-11 DIAGNOSIS — Z20822 Contact with and (suspected) exposure to covid-19: Secondary | ICD-10-CM | POA: Diagnosis not present

## 2020-01-11 DIAGNOSIS — Z791 Long term (current) use of non-steroidal anti-inflammatories (NSAID): Secondary | ICD-10-CM | POA: Diagnosis not present

## 2020-01-11 DIAGNOSIS — J45909 Unspecified asthma, uncomplicated: Secondary | ICD-10-CM | POA: Insufficient documentation

## 2020-01-11 DIAGNOSIS — I10 Essential (primary) hypertension: Secondary | ICD-10-CM | POA: Diagnosis not present

## 2020-01-11 DIAGNOSIS — R0989 Other specified symptoms and signs involving the circulatory and respiratory systems: Secondary | ICD-10-CM | POA: Insufficient documentation

## 2020-01-11 DIAGNOSIS — E785 Hyperlipidemia, unspecified: Secondary | ICD-10-CM | POA: Insufficient documentation

## 2020-01-11 DIAGNOSIS — Z8673 Personal history of transient ischemic attack (TIA), and cerebral infarction without residual deficits: Secondary | ICD-10-CM | POA: Diagnosis not present

## 2020-01-11 DIAGNOSIS — Z79899 Other long term (current) drug therapy: Secondary | ICD-10-CM | POA: Diagnosis not present

## 2020-01-11 MED ORDER — CETIRIZINE HCL 10 MG PO TABS: 10.0000 mg | ORAL_TABLET | Freq: Every day | ORAL | 0 refills | Status: DC

## 2020-01-11 MED ORDER — HYDROCODONE-HOMATROPINE 5-1.5 MG/5ML PO SYRP: 5.0000 mL | ORAL_SOLUTION | Freq: Four times a day (QID) | ORAL | 0 refills | Status: DC | PRN

## 2020-01-11 NOTE — Discharge Instructions (Addendum)
Start taking Zyrtec daily.  Continue the Flonase.  Refilled the cough medicine to use as needed. Start doing Mucinex daily Make sure that you are drinking plenty of water.  Follow up as needed for continued or worsening symptoms

## 2020-01-11 NOTE — ED Triage Notes (Signed)
Pt states he has chest congestion and cough x 1 week. Pt states he has been taking cough meds and Flonase. Pt got this from his PCP. Pt states its not working.

## 2020-01-12 LAB — SARS CORONAVIRUS 2 BY RT PCR (DIASORIN): SARS Coronavirus 2: NEGATIVE

## 2020-01-15 NOTE — ED Provider Notes (Signed)
[facility]    CSN: [id number] Arrival date & time: 01/11/20  9509      History   Chief Complaint Chief Complaint  Patient presents with  . Cough    HPI Gary Clark is a 49 y.o. male.   Patient is a 49 year old male presents today with cough, chest congestion, postnasal drip.  Symptoms have been constant, waxing waning over the past week.  He has been taking Hycodan cough syrup and Flonase without much relief.  Mild production of mucus.  Denies any fever, chills, body aches, chest pain or shortness of breath.  ROS per HPI      Past Medical History:  Diagnosis Date  . Allergy   . Anxiety   . Arthritis   . Asthma    years ago  . Back pain   . Blood in stool   . Cluster headache   . Depression   . Headache(784.0)    frequent   . History of kidney stones    passed 2 times  . Hyperlipidemia   . Hypertension   . Lumbosacral spondylosis without myelopathy 06/15/2013  . Migraines     10/27/2018- history of. Not current  . Postlaminectomy syndrome, lumbar region 03/08/2015   Status post L4-L5 decompression and fusion, anterior/posterior in 2011   . Pre-diabetes    pre diabetic per his PCP  . Sleep apnea    tested in 2014 positive   . Stroke [facility]) 2008   pt states "mini-Stroke"-   . UTI (urinary tract infection)     Patient Active Problem List   Diagnosis Date Noted  . S/P lumbar fusion 11/04/2018  . Degeneration of lumbar intervertebral disc 10/27/2017  . History of lumbar fusion 10/27/2017  . Back pain 10/01/2016  . Lumbar radiculitis 07/17/2016  . Lumbosacral radiculitis 05/19/2016  . Patellofemoral syndrome of both knees 05/19/2016  . Sacroiliac joint dysfunction of both sides 02/04/2016  . Obesity 06/03/2015  . Mild intermittent asthma 06/03/2015  . Major depressive disorder, recurrent, in remission (HCC) 06/03/2015  . Postlaminectomy syndrome, lumbar region 03/08/2015  . Migraine/Cluster HAs 10/14/2012  . Chronic low back pain - hx spinal  stenosis, decompression surgery in 2010, 2011 10/14/2012  . Hyperlipemia 10/14/2012  . Hypertension 10/14/2012    Past Surgical History:  Procedure Laterality Date  . ANTERIOR AND POSTERIOR SPINAL FUSION  2011  . ANTERIOR LAT LUMBAR FUSION N/A 10/01/2016   Procedure: XLIF L3-4 ANTERIOR LATERAL LUMBAR FUSION 1 LEVEL;  Surgeon: Venita Lick, MD;  Location: [facility];  Service: Orthopedics;  Laterality: N/A;  . BACK SURGERY    . HARDWARE REMOVAL N/A 10/01/2016   Procedure: Removal of pedical screws L4-5 HARDWARE REMOVAL;  Surgeon: Venita Lick, MD;  Location: [facility];  Service: Orthopedics;  Laterality: N/A;  . LUMBAR LAMINECTOMY/DECOMPRESSION MICRODISCECTOMY  2010       Home Medications    Prior to Admission medications   Medication Sig Start Date End Date Taking? Authorizing Provider  amitriptyline (ELAVIL) 25 MG tablet Take 1 tablet (25 mg total) by mouth at bedtime. 06/05/19   [place], Betty G, MD  cetirizine (ZYRTEC) 10 MG tablet Take 1 tablet (10 mg total) by mouth daily. 01/11/20   Dahlia Byes A, NP  citalopram (CELEXA) 40 MG tablet TAKE 1 TABLET BY MOUTH ONCE DAILY (PATIENT NEEDS APPOINTMENT). 10/24/19   Philip Aspen, Limmie Patricia, MD  diclofenac sodium (VOLTAREN) 1 % GEL Apply 4 g topically 4 (four) times daily. 06/05/19   [place], Betty G,  MD  fluticasone (FLONASE) 50 MCG/ACT nasal spray Place 2 sprays into both nostrils daily. 12/27/19   [place], Betty G, MD  gabapentin (NEURONTIN) 300 MG capsule Take 300 mg by mouth 3 (three) times daily. 12/20/18   [provider]  hydrochlorothiazide (HYDRODIURIL) 25 MG tablet Take 1 tablet (25 mg total) by mouth daily. 10/24/19   Isaac Bliss, Rayford Halsted, MD  HYDROcodone-homatropine [facility]) 5-1.5 MG/5ML syrup Take 5 mLs by mouth every 6 (six) hours as needed for cough. 01/11/20   Loura Halt A, NP  ipratropium (ATROVENT) 0.06 % nasal spray Place 2 sprays into both nostrils 4 (four) times daily. 11/19/17   Vivi Barrack, MD  lisinopril (ZESTRIL)  20 MG tablet Take 1 tablet (20 mg total) by mouth daily. 10/24/19   Isaac Bliss, Rayford Halsted, MD  methocarbamol (ROBAXIN) 500 MG tablet Take 1 tablet (500 mg total) by mouth every 6 (six) hours as needed for muscle spasms. 06/05/19   [place], Betty G, MD  oxyCODONE 10 MG TABS Take 1 tablet (10 mg total) by mouth every 4 (four) hours as needed for severe pain ((score 7 to 10)). 11/05/18   Eustace Moore, MD  predniSONE (STERAPRED UNI-PAK 21 TAB) 10 MG (21) TBPK tablet Follow direction on packaging. 06/23/19   Peri Jefferson, PA-C  rosuvastatin (CRESTOR) 40 MG tablet Take 1 tablet (40 mg total) by mouth daily. 10/11/18   Lucretia Kern, DO    Family History Family History  Problem Relation Age of Onset  . Heart disease Mother   . Hypertension Mother   . Arthritis Father   . Hypertension Father   . Prostate cancer Father   . Miscarriages / Stillbirths Maternal Grandmother   . Heart disease Maternal Grandfather   . Sudden death Maternal Grandfather   . Breast cancer Sister   . Migraines Sister     Social History Social History   Tobacco Use  . Smoking status: Never Smoker  . Smokeless tobacco: Never Used  Substance Use Topics  . Alcohol use: Yes    Alcohol/week: 10.0 standard drinks    Types: 10 Cans of beer per week  . Drug use: No     Allergies   Codeine   Review of Systems Review of Systems   Physical Exam Triage Vital Signs ED Triage Vitals  Enc Vitals Group     BP 01/11/20 0846 (!) 136/97     Pulse Rate 01/11/20 0846 89     Resp 01/11/20 0846 18     Temp 01/11/20 0846 98.6 F (37 C)     Temp Source 01/11/20 0846 Oral     SpO2 01/11/20 0846 99 %     Weight 01/11/20 0845 226 lb (102.5 kg)     Height --      Head Circumference --      Peak Flow --      Pain Score 01/11/20 0845 5     Pain Loc --      Pain Edu? --      Excl. in [place]? --    No data found.  Updated Vital Signs BP (!) 136/97 (BP Location: Right Arm)   Pulse 89   Temp 98.6 F (37 C) (Oral)    Resp 18   Wt 226 lb (102.5 kg)   SpO2 99%   BMI 36.48 kg/m   Visual Acuity Right Eye Distance:   Left Eye Distance:   Bilateral Distance:    Right Eye Near:   Left  Eye Near:    Bilateral Near:     Physical Exam Vitals and nursing note reviewed.  Constitutional:      General: He is not in acute distress.    Appearance: Normal appearance. He is not ill-appearing, toxic-appearing or diaphoretic.  HENT:     Head: Normocephalic and atraumatic.     Right Ear: Tympanic membrane and ear canal normal.     Left Ear: Tympanic membrane and ear canal normal.     Nose: Nose normal.  Eyes:     Conjunctiva/sclera: Conjunctivae normal.  Cardiovascular:     Pulses: Normal pulses.     Heart sounds: Normal heart sounds.  Pulmonary:     Effort: Pulmonary effort is normal.     Breath sounds: Normal breath sounds.  Musculoskeletal:        General: Normal range of motion.     Cervical back: Normal range of motion.  Skin:    General: Skin is warm and dry.  Neurological:     Mental Status: He is alert.  Psychiatric:        Mood and Affect: Mood normal.      UC Treatments / Results  Labs (all labs ordered are listed, but only abnormal results are displayed) Labs Reviewed  SARS CORONAVIRUS 2 BY RT PCR (DIASORIN)    EKG   Radiology No results found.  Procedures Procedures (including critical care time)  Medications Ordered in UC Medications - No data to display  Initial Impression / Assessment and Plan / UC Course  I have reviewed the triage vital signs and the nursing notes.  Pertinent labs & imaging results that were available during my care of the patient were reviewed by me and considered in my medical decision making (see chart for details).     Cough- most likely allergy related We will have him add Zyrtec daily to Flonase Refill cough medicine to use as needed Follow up as needed for continued or worsening symptoms  Final Clinical Impressions(s) / UC Diagnoses     Final diagnoses:  Cough     Discharge Instructions     Start taking Zyrtec daily.  Continue the Flonase.  Refilled the cough medicine to use as needed. Start doing Mucinex daily Make sure that you are drinking plenty of water.  Follow up as needed for continued or worsening symptoms      ED Prescriptions    Medication Sig Dispense Auth. Provider   cetirizine (ZYRTEC) 10 MG tablet Take 1 tablet (10 mg total) by mouth daily. 30 tablet Deajah Erkkila A, NP   HYDROcodone-homatropine (HYCODAN) 5-1.5 MG/5ML syrup Take 5 mLs by mouth every 6 (six) hours as needed for cough. 120 mL Zahria Ding A, NP     PDMP not reviewed this encounter.   Janace Aris, NP 01/15/20 1807

## 2020-01-25 ENCOUNTER — Other Ambulatory Visit: Payer: Self-pay

## 2020-01-26 ENCOUNTER — Encounter: Payer: Self-pay | Admitting: Internal Medicine

## 2020-01-26 ENCOUNTER — Ambulatory Visit (INDEPENDENT_AMBULATORY_CARE_PROVIDER_SITE_OTHER): Payer: Medicare HMO | Admitting: Internal Medicine

## 2020-01-26 VITALS — BP 124/84 | HR 114 | Temp 97.5°F | Wt 224.3 lb

## 2020-01-26 DIAGNOSIS — Z713 Dietary counseling and surveillance: Secondary | ICD-10-CM

## 2020-01-26 DIAGNOSIS — Z6836 Body mass index (BMI) 36.0-36.9, adult: Secondary | ICD-10-CM | POA: Diagnosis not present

## 2020-01-26 NOTE — Progress Notes (Signed)
Established Patient Office Visit     This visit occurred during the SARS-CoV-2 public health emergency.  Safety protocols were in place, including screening questions prior to the visit, additional usage of staff PPE, and extensive cleaning of exam room while observing appropriate contact time as indicated for disinfecting solutions.    CC/Reason for Visit: He is here today to discuss weight loss  HPI: Gary Clark is a 49 y.o. male who is coming in today for the above mentioned reasons. He is here today to talk about weight loss.  He went to the bariatric clinic but was paying too much and co-pays.  They prescribed phentermine for him which he liked because it took his hunger away. He did not lose any weight, in fact he has gained some weight since he was last in the office.  He is here today to discuss weight loss options.   Past Medical/Surgical History: Past Medical History:  Diagnosis Date  . Allergy   . Anxiety   . Arthritis   . Asthma    years ago  . Back pain   . Blood in stool   . Cluster headache   . Depression   . Headache(784.0)    frequent   . History of kidney stones    passed 2 times  . Hyperlipidemia   . Hypertension   . Lumbosacral spondylosis without myelopathy 06/15/2013  . Migraines     10/27/2018- history of. Not current  . Postlaminectomy syndrome, lumbar region 03/08/2015   Status post L4-L5 decompression and fusion, anterior/posterior in 2011   . Pre-diabetes    pre diabetic per his PCP  . Sleep apnea    tested in 2014 positive   . Stroke [facility]) 2008   pt states "mini-Stroke"-   . UTI (urinary tract infection)     Past Surgical History:  Procedure Laterality Date  . ANTERIOR AND POSTERIOR SPINAL FUSION  2011  . ANTERIOR LAT LUMBAR FUSION N/A 10/01/2016   Procedure: XLIF L3-4 ANTERIOR LATERAL LUMBAR FUSION 1 LEVEL;  Surgeon: Melina Schools, MD;  Location: [facility];  Service: Orthopedics;  Laterality: N/A;  . BACK SURGERY    . HARDWARE REMOVAL  N/A 10/01/2016   Procedure: Removal of pedical screws L4-5 HARDWARE REMOVAL;  Surgeon: Melina Schools, MD;  Location: [facility];  Service: Orthopedics;  Laterality: N/A;  . LUMBAR LAMINECTOMY/DECOMPRESSION MICRODISCECTOMY  2010    Social History:  reports that he has never smoked. He has never used smokeless tobacco. He reports current alcohol use of about 10.0 standard drinks of alcohol per week. He reports that he does not use drugs.  Allergies: Allergies  Allergen Reactions  . Codeine Other (See Comments)    Gets "loopy"    Family History:  Family History  Problem Relation Age of Onset  . Heart disease Mother   . Hypertension Mother   . Arthritis Father   . Hypertension Father   . Prostate cancer Father   . Miscarriages / Stillbirths Maternal Grandmother   . Heart disease Maternal Grandfather   . Sudden death Maternal Grandfather   . Breast cancer Sister   . Migraines Sister      Current Outpatient Medications:  .  amitriptyline (ELAVIL) 25 MG tablet, Take 1 tablet (25 mg total) by mouth at bedtime., Disp: 30 tablet, Rfl: 1 .  cetirizine (ZYRTEC) 10 MG tablet, Take 1 tablet (10 mg total) by mouth daily., Disp: 30 tablet, Rfl: 0 .  citalopram (CELEXA) 40 MG  tablet, TAKE 1 TABLET BY MOUTH ONCE DAILY (PATIENT NEEDS APPOINTMENT)., Disp: 90 tablet, Rfl: 0 .  diclofenac sodium (VOLTAREN) 1 % GEL, Apply 4 g topically 4 (four) times daily., Disp: 100 g, Rfl: 0 .  fluticasone (FLONASE) 50 MCG/ACT nasal spray, Place 2 sprays into both nostrils daily., Disp: 16 g, Rfl: 3 .  gabapentin (NEURONTIN) 300 MG capsule, Take 300 mg by mouth 3 (three) times daily., Disp: , Rfl:  .  hydrochlorothiazide (HYDRODIURIL) 25 MG tablet, Take 1 tablet (25 mg total) by mouth daily., Disp: 90 tablet, Rfl: 0 .  ipratropium (ATROVENT) 0.06 % nasal spray, Place 2 sprays into both nostrils 4 (four) times daily., Disp: 15 mL, Rfl: 0 .  lisinopril (ZESTRIL) 20 MG tablet, Take 1 tablet (20 mg total) by mouth daily.,  Disp: 90 tablet, Rfl: 0 .  methocarbamol (ROBAXIN) 500 MG tablet, Take 1 tablet (500 mg total) by mouth every 6 (six) hours as needed for muscle spasms., Disp: 60 tablet, Rfl: 1 .  rosuvastatin (CRESTOR) 40 MG tablet, Take 1 tablet (40 mg total) by mouth daily., Disp: 90 tablet, Rfl: 3  Review of Systems:  Constitutional: Denies fever, chills, diaphoresis, appetite change and fatigue.  HEENT: Denies photophobia, eye pain, redness, hearing loss, ear pain, congestion, sore throat, rhinorrhea, sneezing, mouth sores, trouble swallowing, neck pain, neck stiffness and tinnitus.   Respiratory: Denies SOB, DOE, cough, chest tightness,  and wheezing.   Cardiovascular: Denies chest pain, palpitations and leg swelling.  Gastrointestinal: Denies nausea, vomiting, abdominal pain, diarrhea, constipation, blood in stool and abdominal distention.  Genitourinary: Denies dysuria, urgency, frequency, hematuria, flank pain and difficulty urinating.  Endocrine: Denies: hot or cold intolerance, sweats, changes in hair or nails, polyuria, polydipsia. Musculoskeletal: Denies myalgias, back pain, joint swelling, arthralgias and gait problem.  Skin: Denies pallor, rash and wound.  Neurological: Denies dizziness, seizures, syncope, weakness, light-headedness, numbness and headaches.  Hematological: Denies adenopathy. Easy bruising, personal or family bleeding history  Psychiatric/Behavioral: Denies suicidal ideation, mood changes, confusion, nervousness, sleep disturbance and agitation    Physical Exam: Vitals:   01/26/20 1014  BP: 124/84  Pulse: (!) 114  Temp: (!) 97.5 F (36.4 C)  TempSrc: Temporal  SpO2: 97%  Weight: 224 lb 4.8 oz (101.7 kg)    Body mass index is 36.2 kg/m.   Constitutional: NAD, calm, comfortable Eyes: PERRL, lids and conjunctivae normal ENMT: Mucous membranes are moist.  Respiratory: clear to auscultation bilaterally, no wheezing, no crackles. Normal respiratory effort. No accessory  muscle use.  Cardiovascular: Regular rate and rhythm, no murmurs / rubs / gallops. No extremity edema. Neurologic: Grossly intact and nonfocal Psychiatric: Normal judgment and insight. Alert and oriented x 3. Normal mood.    Impression and Plan:  Weight loss counseling, encounter for  Class 2 severe obesity with serious comorbidity and body mass index (BMI) of 36.0 to 36.9 in adult, unspecified obesity type (HCC)  -We have discussed a low-carb lifestyle, importance of meal planning, avoidance of rapid, poor healthy choices including fast food meals.  We have also discussed increased physical activity.  He will see me back in 6 weeks, with plan for 5 to 8 pound weight loss at that time frame.  If he shows me that he is committed to losing weight, we can possibly consider pharmacologic options as well.    Patient Instructions  -Nice seeing you today!!  -See you back in 6 weeks.     Chaya Jan, MD [facility]

## 2020-01-26 NOTE — Patient Instructions (Signed)
-  Nice seeing you today!!  -See you back in 6 weeks.

## 2020-03-01 ENCOUNTER — Other Ambulatory Visit: Payer: Self-pay | Admitting: Internal Medicine

## 2020-03-01 DIAGNOSIS — F3340 Major depressive disorder, recurrent, in remission, unspecified: Secondary | ICD-10-CM

## 2020-03-04 MED ORDER — HYDROCHLOROTHIAZIDE 25 MG PO TABS: 25.0000 mg | ORAL_TABLET | Freq: Every day | ORAL | 0 refills | Status: DC

## 2020-03-04 NOTE — Telephone Encounter (Signed)
Pt is needing a refill due to him being out of citalopram (CELEXA) 40 MG, and hydrochlorothiazide (HYDRODIURIL) 25 MG   Pharm:  [facility].

## 2020-03-04 NOTE — Telephone Encounter (Signed)
Rx done. 

## 2020-06-11 ENCOUNTER — Telehealth: Payer: Self-pay | Admitting: Internal Medicine

## 2020-06-11 DIAGNOSIS — I10 Essential (primary) hypertension: Secondary | ICD-10-CM

## 2020-06-11 MED ORDER — LISINOPRIL 20 MG PO TABS: 20.0000 mg | ORAL_TABLET | Freq: Every day | ORAL | 0 refills | Status: DC

## 2020-06-11 NOTE — Telephone Encounter (Signed)
Refill sent.

## 2020-06-11 NOTE — Telephone Encounter (Signed)
pt need a refill on  lisinopril (ZESTRIL) 20 MG tablet [id number] -  ([place]), [place] - [address]

## 2020-06-16 ENCOUNTER — Other Ambulatory Visit: Payer: Self-pay | Admitting: Internal Medicine

## 2020-06-16 DIAGNOSIS — F3340 Major depressive disorder, recurrent, in remission, unspecified: Secondary | ICD-10-CM

## 2020-08-11 ENCOUNTER — Ambulatory Visit (HOSPITAL_COMMUNITY)
Admission: EM | Admit: 2020-08-11 | Discharge: 2020-08-11 | Disposition: A | Discharge status: Home / Self Care | Payer: Medicare HMO | Attending: Emergency Medicine | Admitting: Emergency Medicine

## 2020-08-11 ENCOUNTER — Other Ambulatory Visit: Payer: Self-pay

## 2020-08-11 ENCOUNTER — Encounter (HOSPITAL_COMMUNITY): Payer: Self-pay | Admitting: *Deleted

## 2020-08-11 DIAGNOSIS — J029 Acute pharyngitis, unspecified: Secondary | ICD-10-CM

## 2020-08-11 DIAGNOSIS — R519 Headache, unspecified: Secondary | ICD-10-CM | POA: Diagnosis not present

## 2020-08-11 DIAGNOSIS — J36 Peritonsillar abscess: Secondary | ICD-10-CM | POA: Insufficient documentation

## 2020-08-11 LAB — POCT RAPID STREP A, ED / UC: Streptococcus, Group A Screen (Direct): NEGATIVE

## 2020-08-11 MED ORDER — AMOXICILLIN 500 MG PO CAPS: 500.0000 mg | ORAL_CAPSULE | Freq: Three times a day (TID) | ORAL | 0 refills | Status: AC

## 2020-08-11 NOTE — Discharge Instructions (Signed)
Sore Throat  Your rapid strep tested Negative today. Begin amoxicillin twice daily for tonsillitis  Please continue Tylenol or Ibuprofen for fever and pain. May try salt water gargles, cepacol lozenges, throat spray, or OTC cold relief medicine for throat discomfort. If you also have congestion take a daily anti-histamine like Zyrtec, Claritin, and a oral decongestant to help with post nasal drip that may be irritating your throat.   Stay hydrated and drink plenty of fluids to keep your throat coated relieve irritation.

## 2020-08-11 NOTE — ED Provider Notes (Signed)
[facility]    CSN: [id number] Arrival date & time: 08/11/20  1032      History   Chief Complaint Chief Complaint  Patient presents with  . Sore Throat  . Headache    HPI Gary Clark is a 49 y.o. male history of hypertension, hyperlipidemia, migraines, presenting today for evaluation of sore throat.  Patient reports that over the past couple days he has developed sore throat and headache.  Sore throat is more prominent on left side.  Beginning to develop left ear pain today as well.  Denies any fevers.  Denies cough chest pain or shortness of breath.  Denies any close sick contacts.   HPI  Past Medical History:  Diagnosis Date  . Allergy   . Anxiety   . Arthritis   . Asthma    years ago  . Back pain   . Blood in stool   . Cluster headache   . Depression   . Headache(784.0)    frequent   . History of kidney stones    passed 2 times  . Hyperlipidemia   . Hypertension   . Lumbosacral spondylosis without myelopathy 06/15/2013  . Migraines     10/27/2018- history of. Not current  . Postlaminectomy syndrome, lumbar region 03/08/2015   Status post L4-L5 decompression and fusion, anterior/posterior in 2011   . Pre-diabetes    pre diabetic per his PCP  . Sleep apnea    tested in 2014 positive   . Stroke [facility]) 2008   pt states "mini-Stroke"-   . UTI (urinary tract infection)     Patient Active Problem List   Diagnosis Date Noted  . S/P lumbar fusion 11/04/2018  . Degeneration of lumbar intervertebral disc 10/27/2017  . History of lumbar fusion 10/27/2017  . Back pain 10/01/2016  . Lumbar radiculitis 07/17/2016  . Lumbosacral radiculitis 05/19/2016  . Patellofemoral syndrome of both knees 05/19/2016  . Sacroiliac joint dysfunction of both sides 02/04/2016  . Obesity 06/03/2015  . Mild intermittent asthma 06/03/2015  . Major depressive disorder, recurrent, in remission (HCC) 06/03/2015  . Postlaminectomy syndrome, lumbar region 03/08/2015  .  Migraine/Cluster HAs 10/14/2012  . Chronic low back pain - hx spinal stenosis, decompression surgery in 2010, 2011 10/14/2012  . Hyperlipemia 10/14/2012  . Hypertension 10/14/2012    Past Surgical History:  Procedure Laterality Date  . ANTERIOR AND POSTERIOR SPINAL FUSION  2011  . ANTERIOR LAT LUMBAR FUSION N/A 10/01/2016   Procedure: XLIF L3-4 ANTERIOR LATERAL LUMBAR FUSION 1 LEVEL;  Surgeon: Venita Lick, MD;  Location: [facility];  Service: Orthopedics;  Laterality: N/A;  . BACK SURGERY    . HARDWARE REMOVAL N/A 10/01/2016   Procedure: Removal of pedical screws L4-5 HARDWARE REMOVAL;  Surgeon: Venita Lick, MD;  Location: [facility];  Service: Orthopedics;  Laterality: N/A;  . LUMBAR LAMINECTOMY/DECOMPRESSION MICRODISCECTOMY  2010       Home Medications    Prior to Admission medications   Medication Sig Start Date End Date Taking? Authorizing Provider  citalopram (CELEXA) 40 MG tablet TAKE 1 TABLET BY MOUTH ONCE DAILY . APPOINTMENT REQUIRED FOR FUTURE REFILLS 06/17/20  Yes Philip Aspen, Limmie Patricia, MD  hydrochlorothiazide (HYDRODIURIL) 25 MG tablet Take 1 tablet (25 mg total) by mouth daily. 03/04/20  Yes Philip Aspen, Limmie Patricia, MD  lisinopril (ZESTRIL) 20 MG tablet Take 1 tablet (20 mg total) by mouth daily. 06/11/20  Yes Philip Aspen, Limmie Patricia, MD  amoxicillin (AMOXIL) 500 MG capsule Take 1 capsule (  500 mg total) by mouth 3 (three) times daily for 10 days. 08/11/20 08/21/20  Johnie Makki C, PA-C  diclofenac sodium (VOLTAREN) 1 % GEL Apply 4 g topically 4 (four) times daily. 06/05/19   [place], Betty G, MD  amitriptyline (ELAVIL) 25 MG tablet Take 1 tablet (25 mg total) by mouth at bedtime. 06/05/19 08/11/20  [place], Betty G, MD  cetirizine (ZYRTEC) 10 MG tablet Take 1 tablet (10 mg total) by mouth daily. 01/11/20 08/11/20  Dahlia Byes A, NP  fluticasone (FLONASE) 50 MCG/ACT nasal spray Place 2 sprays into both nostrils daily. 12/27/19 08/11/20  [place], Betty G, MD  ipratropium  (ATROVENT) 0.06 % nasal spray Place 2 sprays into both nostrils 4 (four) times daily. 11/19/17 08/11/20  Ardith Dark, MD  rosuvastatin (CRESTOR) 40 MG tablet Take 1 tablet (40 mg total) by mouth daily. 10/11/18 08/11/20  Terressa Koyanagi, DO    Family History Family History  Problem Relation Age of Onset  . Heart disease Mother   . Hypertension Mother   . Arthritis Father   . Hypertension Father   . Prostate cancer Father   . Miscarriages / Stillbirths Maternal Grandmother   . Heart disease Maternal Grandfather   . Sudden death Maternal Grandfather   . Breast cancer Sister   . Migraines Sister     Social History Social History   Tobacco Use  . Smoking status: Never Smoker  . Smokeless tobacco: Never Used  Vaping Use  . Vaping Use: Never used  Substance Use Topics  . Alcohol use: Yes    Alcohol/week: 10.0 standard drinks    Types: 10 Cans of beer per week  . Drug use: No     Allergies   Codeine   Review of Systems Review of Systems  Constitutional: Negative for activity change, appetite change, chills, fatigue and fever.  HENT: Positive for ear pain and sore throat. Negative for congestion, rhinorrhea, sinus pressure and trouble swallowing.   Eyes: Negative for discharge and redness.  Respiratory: Negative for cough, chest tightness and shortness of breath.   Cardiovascular: Negative for chest pain.  Gastrointestinal: Negative for abdominal pain, diarrhea, nausea and vomiting.  Musculoskeletal: Negative for myalgias.  Skin: Negative for rash.  Neurological: Positive for headaches. Negative for dizziness and light-headedness.     Physical Exam Triage Vital Signs ED Triage Vitals  Enc Vitals Group     BP 08/11/20 1130 (!) 143/97     Pulse Rate 08/11/20 1130 92     Resp 08/11/20 1130 20     Temp 08/11/20 1130 99.3 F (37.4 C)     Temp Source 08/11/20 1130 Oral     SpO2 08/11/20 1130 99 %     Weight --      Height --      Head Circumference --      Peak  Flow --      Pain Score 08/11/20 1131 3     Pain Loc --      Pain Edu? --      Excl. in GC? --    No data found.  Updated Vital Signs BP (!) 143/97   Pulse 92   Temp 99.3 F (37.4 C) (Oral)   Resp 20   SpO2 99%   Visual Acuity Right Eye Distance:   Left Eye Distance:   Bilateral Distance:    Right Eye Near:   Left Eye Near:    Bilateral Near:     Physical Exam Vitals and  nursing note reviewed.  Constitutional:      Appearance: He is well-developed.     Comments: No acute distress  HENT:     Head: Normocephalic and atraumatic.     Ears:     Comments: Bilateral ears without tenderness to palpation of external auricle, tragus and mastoid, EAC's without erythema or swelling, TM's with good bony landmarks and cone of light. Non erythematous.     Nose: Nose normal.     Mouth/Throat:     Comments: Oral mucosa pink and moist, left tonsil appears deeply erythematous and enlarged with small amount of exudate present, no soft palate swelling, posterior pharynx patent and nonerythematous, no uvula deviation or swelling. Normal phonation. Eyes:     Conjunctiva/sclera: Conjunctivae normal.  Neck:     Comments: Swelling tender left tonsil lymphadenopathy, full active range of motion of neck Cardiovascular:     Rate and Rhythm: Normal rate.  Pulmonary:     Effort: Pulmonary effort is normal. No respiratory distress.     Comments: Breathing comfortably at rest, CTABL, no wheezing, rales or other adventitious sounds auscultated Abdominal:     General: There is no distension.  Musculoskeletal:        General: Normal range of motion.     Cervical back: Neck supple.  Skin:    General: Skin is warm and dry.  Neurological:     Mental Status: He is alert and oriented to person, place, and time.      UC Treatments / Results  Labs (all labs ordered are listed, but only abnormal results are displayed) Labs Reviewed  CULTURE, GROUP A STREP [facility])  POCT RAPID STREP A, ED / UC     EKG   Radiology No results found.  Procedures Procedures (including critical care time)  Medications Ordered in UC Medications - No data to display  Initial Impression / Assessment and Plan / UC Course  I have reviewed the triage vital signs and the nursing notes.  Pertinent labs & imaging results that were available during my care of the patient were reviewed by me and considered in my medical decision making (see chart for details).     Strep test negative, given appearance of tonsils and unilateral nature initiating on amoxicillin to cover for tonsillitis/early tonsillar abscess.  Continue symptomatic and supportive care as well.  No airway compromise or sign of deep space infection at this time.  Discussed strict return precautions. Patient verbalized understanding and is agreeable with plan.  Final Clinical Impressions(s) / UC Diagnoses   Final diagnoses:  Peritonsillitis     Discharge Instructions     Sore Throat  Your rapid strep tested Negative today. Begin amoxicillin twice daily for tonsillitis  Please continue Tylenol or Ibuprofen for fever and pain. May try salt water gargles, cepacol lozenges, throat spray, or OTC cold relief medicine for throat discomfort. If you also have congestion take a daily anti-histamine like Zyrtec, Claritin, and a oral decongestant to help with post nasal drip that may be irritating your throat.   Stay hydrated and drink plenty of fluids to keep your throat coated relieve irritation.     ED Prescriptions    Medication Sig Dispense Auth. Provider   amoxicillin (AMOXIL) 500 MG capsule Take 1 capsule (500 mg total) by mouth 3 (three) times daily for 10 days. 30 capsule Karalee Hauter, [place] C, PA-C     PDMP not reviewed this encounter.   Lew Dawes, PA-C 08/11/20 1213

## 2020-08-11 NOTE — ED Triage Notes (Addendum)
C/O sore throat and HA onset yesterday without any congestion or fevers.  Also c/o left ear pain onset today.

## 2020-08-13 LAB — CULTURE, GROUP A STREP (THRC)

## 2020-10-12 ENCOUNTER — Other Ambulatory Visit: Payer: Self-pay | Admitting: Internal Medicine

## 2020-10-12 DIAGNOSIS — F3340 Major depressive disorder, recurrent, in remission, unspecified: Secondary | ICD-10-CM

## 2020-10-12 DIAGNOSIS — I10 Essential (primary) hypertension: Secondary | ICD-10-CM

## 2020-10-16 DIAGNOSIS — E785 Hyperlipidemia, unspecified: Secondary | ICD-10-CM | POA: Diagnosis not present

## 2020-10-16 DIAGNOSIS — E559 Vitamin D deficiency, unspecified: Secondary | ICD-10-CM | POA: Diagnosis not present

## 2020-10-16 DIAGNOSIS — E291 Testicular hypofunction: Secondary | ICD-10-CM | POA: Diagnosis not present

## 2020-10-16 DIAGNOSIS — R7309 Other abnormal glucose: Secondary | ICD-10-CM | POA: Diagnosis not present

## 2020-10-16 DIAGNOSIS — R635 Abnormal weight gain: Secondary | ICD-10-CM | POA: Diagnosis not present

## 2020-10-17 ENCOUNTER — Telehealth (INDEPENDENT_AMBULATORY_CARE_PROVIDER_SITE_OTHER): Payer: Medicare HMO | Admitting: Internal Medicine

## 2020-10-17 ENCOUNTER — Other Ambulatory Visit: Payer: Self-pay

## 2020-10-17 DIAGNOSIS — F3340 Major depressive disorder, recurrent, in remission, unspecified: Secondary | ICD-10-CM

## 2020-10-17 DIAGNOSIS — I10 Essential (primary) hypertension: Secondary | ICD-10-CM | POA: Diagnosis not present

## 2020-10-17 MED ORDER — CITALOPRAM HYDROBROMIDE 40 MG PO TABS: 40.0000 mg | ORAL_TABLET | Freq: Every day | ORAL | 1 refills | Status: DC

## 2020-10-17 MED ORDER — HYDROCHLOROTHIAZIDE 25 MG PO TABS: 25.0000 mg | ORAL_TABLET | Freq: Every day | ORAL | 1 refills | Status: DC

## 2020-10-17 MED ORDER — BUPROPION HCL ER (XL) 150 MG PO TB24: 150.0000 mg | ORAL_TABLET | Freq: Every day | ORAL | 1 refills | Status: DC

## 2020-10-17 MED ORDER — LISINOPRIL 20 MG PO TABS: 20.0000 mg | ORAL_TABLET | Freq: Every day | ORAL | 1 refills | Status: DC

## 2020-10-17 NOTE — Progress Notes (Signed)
Virtual Visit via Telephone Note  I connected with [name] on 10/17/20 at  3:30 PM EST by telephone and verified that I am speaking with the correct person using two identifiers.   I discussed the limitations, risks, security and privacy concerns of performing an evaluation and management service by telephone and the availability of in person appointments. I also discussed with the patient that there may be a patient responsible charge related to this service. The patient expressed understanding and agreed to proceed.  Location patient: home Location provider: work office Participants present for the call: patient, provider Patient did not have a visit in the prior 7 days to address this/these issue(s).   History of Present Illness:  He has scheduled this visit for medication refills and to discuss depression.  He has had increasing depression and anxiety after separating from his wife last summer.  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  He feels like the Celexa alone is not working for his depression.  He also needs refills of his blood pressure medication.  He took his last doses today.   Observations/Objective: Patient sounds cheerful and well on the phone. I do not appreciate any increased work of breathing. Speech and thought processing are grossly intact. Patient reported vitals: None reported   Current Outpatient Medications:  .  buPROPion (WELLBUTRIN XL) 150 MG 24 hr tablet, Take 1 tablet (150 mg total) by mouth daily., Disp: 90 tablet, Rfl: 1 .  citalopram (CELEXA) 40 MG tablet, Take 1 tablet (40 mg total) by mouth daily., Disp: 90 tablet, Rfl: 1 .  hydrochlorothiazide (HYDRODIURIL) 25 MG tablet, Take 1 tablet (25 mg total) by mouth daily., Disp: 90 tablet, Rfl: 1 .  lisinopril (ZESTRIL) 20 MG tablet, Take 1 tablet (20 mg total) by mouth daily., Disp: 90 tablet, Rfl: 1  Review of Systems:  Constitutional: Denies fever, chills, diaphoresis,  appetite change and fatigue.  HEENT: Denies photophobia, eye pain, redness, hearing loss, ear pain, congestion, sore throat, rhinorrhea, sneezing, mouth sores, trouble swallowing, neck pain, neck stiffness and tinnitus.   Respiratory: Denies SOB, DOE, cough, chest tightness,  and wheezing.   Cardiovascular: Denies chest pain, palpitations and leg swelling.  Gastrointestinal: Denies nausea, vomiting, abdominal pain, diarrhea, constipation, blood in stool and abdominal distention.  Genitourinary: Denies dysuria, urgency, frequency, hematuria, flank pain and difficulty urinating.  Endocrine: Denies: hot or cold intolerance, sweats, changes in hair or nails, polyuria, polydipsia. Musculoskeletal: Denies myalgias, back pain, joint swelling, arthralgias and gait problem.  Skin: Denies pallor, rash and wound.  Neurological: Denies dizziness, seizures, syncope, weakness, light-headedness, numbness and headaches.  Hematological: Denies adenopathy. Easy bruising, personal or family bleeding history  Psychiatric/Behavioral: Denies suicidal ideation, confusion, nervousness, sleep disturbance and agitation   Assessment and Plan:  Essential hypertension  - Plan: hydrochlorothiazide (HYDRODIURIL) 25 MG tablet, lisinopril (ZESTRIL) 20 MG tablet -He has not been checking blood pressure, he will call to schedule follow-up soon.  Major depressive disorder, recurrent, in remission (HCC) - -continue Celexa 40 mg daily. -Add Wellbutrin 150 mg daily. -Have encouraged considering therapy/counseling sessions. -He will return in 8 weeks for follow-up.    I discussed the assessment and treatment plan with the patient. The patient was provided an opportunity to ask questions and all were answered. The patient agreed with the plan and demonstrated an understanding of the instructions.   The patient was advised to call back or seek an in-person evaluation if the symptoms worsen  or if the condition fails to improve  as anticipated.  I provided 23 minutes of non-face-to-face time during this encounter.   Chaya Jan, MD [facility] at [facility]

## 2020-10-21 DIAGNOSIS — E291 Testicular hypofunction: Secondary | ICD-10-CM | POA: Diagnosis not present

## 2020-10-21 DIAGNOSIS — F329 Major depressive disorder, single episode, unspecified: Secondary | ICD-10-CM | POA: Diagnosis not present

## 2020-10-21 DIAGNOSIS — Z6836 Body mass index (BMI) 36.0-36.9, adult: Secondary | ICD-10-CM | POA: Diagnosis not present

## 2020-10-21 DIAGNOSIS — R635 Abnormal weight gain: Secondary | ICD-10-CM | POA: Diagnosis not present

## 2020-10-21 DIAGNOSIS — I10 Essential (primary) hypertension: Secondary | ICD-10-CM | POA: Diagnosis not present

## 2020-10-21 DIAGNOSIS — Z1339 Encounter for screening examination for other mental health and behavioral disorders: Secondary | ICD-10-CM | POA: Diagnosis not present

## 2020-10-21 DIAGNOSIS — Z1331 Encounter for screening for depression: Secondary | ICD-10-CM | POA: Diagnosis not present

## 2020-10-21 DIAGNOSIS — E785 Hyperlipidemia, unspecified: Secondary | ICD-10-CM | POA: Diagnosis not present

## 2020-10-28 DIAGNOSIS — E291 Testicular hypofunction: Secondary | ICD-10-CM | POA: Diagnosis not present

## 2020-11-07 ENCOUNTER — Encounter: Payer: Self-pay | Admitting: Internal Medicine

## 2020-11-07 ENCOUNTER — Telehealth (INDEPENDENT_AMBULATORY_CARE_PROVIDER_SITE_OTHER): Payer: Medicare HMO | Admitting: Internal Medicine

## 2020-11-07 ENCOUNTER — Other Ambulatory Visit: Payer: Self-pay

## 2020-11-07 VITALS — Wt 220.0 lb

## 2020-11-07 DIAGNOSIS — N529 Male erectile dysfunction, unspecified: Secondary | ICD-10-CM

## 2020-11-07 DIAGNOSIS — F3340 Major depressive disorder, recurrent, in remission, unspecified: Secondary | ICD-10-CM | POA: Diagnosis not present

## 2020-11-07 MED ORDER — TADALAFIL 20 MG PO TABS: 20.0000 mg | ORAL_TABLET | Freq: Every day | ORAL | 11 refills | Status: DC | PRN

## 2020-11-07 MED ORDER — SERTRALINE HCL 25 MG PO TABS: 25.0000 mg | ORAL_TABLET | Freq: Every day | ORAL | 1 refills | Status: DC

## 2020-11-07 NOTE — Progress Notes (Signed)
Virtual Visit via Telephone Note  I connected with [name] on 11/07/20 at  4:00 PM EST by telephone and verified that I am speaking with the correct person using two identifiers.   I discussed the limitations, risks, security and privacy concerns of performing an evaluation and management service by telephone and the availability of in person appointments. I also discussed with the patient that there may be a patient responsible charge related to this service. The patient expressed understanding and agreed to proceed.  Location patient: home Location provider: work office Participants present for the call: patient, provider Patient did not have a visit in the prior 7 days to address this/these issue(s).   History of Present Illness:  He has scheduled this visit to discuss 2 concerns:  1.  At last visit we had added Wellbutrin to his Celexa due to ongoing depression symptoms.  He has been unable to tolerate it.  States it makes him dizzy, has a "brain fog".  He quit taking it.  He has not yet looked into CBT sessions.  2.  He struggles with erectile dysfunction.  He has tried sildenafil in the past would like to try some.   Observations/Objective: Patient sounds cheerful and well on the phone. I do not appreciate any increased work of breathing. Speech and thought processing are grossly intact. Patient reported vitals: None reported   Current Outpatient Medications:  .  citalopram (CELEXA) 40 MG tablet, Take 1 tablet (40 mg total) by mouth daily., Disp: 90 tablet, Rfl: 1 .  hydrochlorothiazide (HYDRODIURIL) 25 MG tablet, Take 1 tablet (25 mg total) by mouth daily., Disp: 90 tablet, Rfl: 1 .  lisinopril (ZESTRIL) 20 MG tablet, Take 1 tablet (20 mg total) by mouth daily., Disp: 90 tablet, Rfl: 1 .  sertraline (ZOLOFT) 25 MG tablet, Take 1 tablet (25 mg total) by mouth at bedtime., Disp: 90 tablet, Rfl: 1 .  tadalafil (CIALIS) 20 MG tablet, Take 1 tablet (20 mg total) by mouth  daily as needed for erectile dysfunction., Disp: 5 tablet, Rfl: 11  Review of Systems:  Constitutional: Denies fever, chills, diaphoresis, appetite change and fatigue.  HEENT: Denies photophobia, eye pain, redness, hearing loss, ear pain, congestion, sore throat, rhinorrhea, sneezing, mouth sores, trouble swallowing, neck pain, neck stiffness and tinnitus.   Respiratory: Denies SOB, DOE, cough, chest tightness,  and wheezing.   Cardiovascular: Denies chest pain, palpitations and leg swelling.  Gastrointestinal: Denies nausea, vomiting, abdominal pain, diarrhea, constipation, blood in stool and abdominal distention.  Genitourinary: Denies dysuria, urgency, frequency, hematuria, flank pain and difficulty urinating.  Endocrine: Denies: hot or cold intolerance, sweats, changes in hair or nails, polyuria, polydipsia. Musculoskeletal: Denies myalgias, back pain, joint swelling, arthralgias and gait problem.  Skin: Denies pallor, rash and wound.  Neurological: Denies dizziness, seizures, syncope, weakness, light-headedness, numbness and headaches.  Hematological: Denies adenopathy. Easy bruising, personal or family bleeding history  Psychiatric/Behavioral: Denies suicidal ideation,confusion, nervousness, sleep disturbance and agitation   Assessment and Plan:  Major depressive disorder, recurrent, in remission (HCC) -We will try low-dose of sertraline 25 mg in addition to Celexa. -Follow-up in 8 to 12 weeks.  Erectile dysfunction, unspecified erectile dysfunction type  - Plan: tadalafil (CIALIS) 20 MG tablet    I discussed the assessment and treatment plan with the patient. The patient was provided an opportunity to ask questions and all were answered. The patient agreed with the plan and demonstrated an understanding of the instructions.   The patient was  advised to call back or seek an in-person evaluation if the symptoms worsen or if the condition fails to improve as anticipated.  I  provided 23 minutes of non-face-to-face time during this encounter.   Chaya Jan, MD [facility] at [facility]

## 2020-11-07 NOTE — Progress Notes (Signed)
Virtual visit 

## 2021-01-07 DIAGNOSIS — M5414 Radiculopathy, thoracic region: Secondary | ICD-10-CM | POA: Diagnosis not present

## 2021-01-07 DIAGNOSIS — M5416 Radiculopathy, lumbar region: Secondary | ICD-10-CM | POA: Diagnosis not present

## 2021-02-03 ENCOUNTER — Ambulatory Visit (HOSPITAL_COMMUNITY)
Admission: EM | Admit: 2021-02-03 | Discharge: 2021-02-03 | Disposition: A | Discharge status: Home / Self Care | Payer: Medicare HMO | Attending: Internal Medicine | Admitting: Internal Medicine

## 2021-02-03 ENCOUNTER — Ambulatory Visit: Payer: Self-pay

## 2021-02-03 ENCOUNTER — Encounter (HOSPITAL_COMMUNITY): Payer: Self-pay

## 2021-02-03 ENCOUNTER — Other Ambulatory Visit: Payer: Self-pay

## 2021-02-03 DIAGNOSIS — R5383 Other fatigue: Secondary | ICD-10-CM | POA: Diagnosis not present

## 2021-02-03 LAB — CBC WITH DIFFERENTIAL/PLATELET
Abs Immature Granulocytes: 0.03 10*3/uL (ref 0.00–0.07)
Basophils Absolute: 0 10*3/uL (ref 0.0–0.1)
Basophils Relative: 0 %
Eosinophils Absolute: 0.1 10*3/uL (ref 0.0–0.5)
Eosinophils Relative: 1 %
HCT: 49.1 % (ref 39.0–52.0)
Hemoglobin: 16.7 g/dL (ref 13.0–17.0)
Immature Granulocytes: 0 %
Lymphocytes Relative: 33 %
Lymphs Abs: 3.2 10*3/uL (ref 0.7–4.0)
MCH: 32.9 pg (ref 26.0–34.0)
MCHC: 34 g/dL (ref 30.0–36.0)
MCV: 96.7 fL (ref 80.0–100.0)
Monocytes Absolute: 0.8 10*3/uL (ref 0.1–1.0)
Monocytes Relative: 8 %
Neutro Abs: 5.6 10*3/uL (ref 1.7–7.7)
Neutrophils Relative %: 58 %
Platelets: 305 10*3/uL (ref 150–400)
RBC: 5.08 MIL/uL (ref 4.22–5.81)
RDW: 12.5 % (ref 11.5–15.5)
WBC: 9.8 10*3/uL (ref 4.0–10.5)
nRBC: 0 % (ref 0.0–0.2)

## 2021-02-03 LAB — COMPREHENSIVE METABOLIC PANEL
ALT: 30 U/L (ref 0–44)
AST: 29 U/L (ref 15–41)
Albumin: 4.7 g/dL (ref 3.5–5.0)
Alkaline Phosphatase: 60 U/L (ref 38–126)
Anion gap: 13 (ref 5–15)
BUN: 22 mg/dL — ABNORMAL HIGH (ref 6–20)
CO2: 26 mmol/L (ref 22–32)
Calcium: 9.8 mg/dL (ref 8.9–10.3)
Chloride: 99 mmol/L (ref 98–111)
Creatinine, Ser: 1.53 mg/dL — ABNORMAL HIGH (ref 0.61–1.24)
GFR, Estimated: 55 mL/min — ABNORMAL LOW (ref >60)
Glucose, Bld: 166 mg/dL — ABNORMAL HIGH (ref 70–99)
Potassium: 4.2 mmol/L (ref 3.5–5.1)
Sodium: 138 mmol/L (ref 135–145)
Total Bilirubin: 1 mg/dL (ref 0.3–1.2)
Total Protein: 8 g/dL (ref 6.5–8.1)

## 2021-02-03 MED ORDER — ONDANSETRON 4 MG PO TBDP: 4.0000 mg | ORAL_TABLET | Freq: Three times a day (TID) | ORAL | 0 refills | Status: DC | PRN

## 2021-02-03 NOTE — Discharge Instructions (Addendum)
Increase oral fluid intake °Take medications as prescribed °We will call you with recommendations if labs are abnormal °

## 2021-02-03 NOTE — ED Provider Notes (Addendum)
[facility]    CSN: [id number] Arrival date & time: 02/03/21  1519      History   Chief Complaint Chief Complaint  Patient presents with  . Fatigue  . Dizziness  . Arm Pain  . Palpitations    HPI Gary Clark is a 50 y.o. male comes to the urgent care with intermittent palpitations, fatigue and intermittent dizziness.  Symptoms started yesterday.  Patient has been experiencing left arm numbness over the past 6 months.  No sick contacts.  Patient is fully vaccinated against COVID-19 virus.  No vomiting, diarrhea or abdominal distention.   Patient has some nausea but no vomiting.  No changes to medications at this time.  HPI  Past Medical History:  Diagnosis Date  . Allergy   . Anxiety   . Arthritis   . Asthma    years ago  . Back pain   . Blood in stool   . Cluster headache   . Depression   . Headache(784.0)    frequent   . History of kidney stones    passed 2 times  . Hyperlipidemia   . Hypertension   . Lumbosacral spondylosis without myelopathy 06/15/2013  . Migraines     10/27/2018- history of. Not current  . Postlaminectomy syndrome, lumbar region 03/08/2015   Status post L4-L5 decompression and fusion, anterior/posterior in 2011   . Pre-diabetes    pre diabetic per his PCP  . Sleep apnea    tested in 2014 positive   . Stroke [facility]) 2008   pt states "mini-Stroke"-   . UTI (urinary tract infection)     Patient Active Problem List   Diagnosis Date Noted  . S/P lumbar fusion 11/04/2018  . Degeneration of lumbar intervertebral disc 10/27/2017  . History of lumbar fusion 10/27/2017  . Back pain 10/01/2016  . Lumbar radiculitis 07/17/2016  . Lumbosacral radiculitis 05/19/2016  . Patellofemoral syndrome of both knees 05/19/2016  . Sacroiliac joint dysfunction of both sides 02/04/2016  . Obesity 06/03/2015  . Mild intermittent asthma 06/03/2015  . Major depressive disorder, recurrent, in remission (HCC) 06/03/2015  . Postlaminectomy syndrome,  lumbar region 03/08/2015  . Migraine/Cluster HAs 10/14/2012  . Chronic low back pain - hx spinal stenosis, decompression surgery in 2010, 2011 10/14/2012  . Hyperlipemia 10/14/2012  . Hypertension 10/14/2012    Past Surgical History:  Procedure Laterality Date  . ANTERIOR AND POSTERIOR SPINAL FUSION  2011  . ANTERIOR LAT LUMBAR FUSION N/A 10/01/2016   Procedure: XLIF L3-4 ANTERIOR LATERAL LUMBAR FUSION 1 LEVEL;  Surgeon: Venita Lick, MD;  Location: [facility];  Service: Orthopedics;  Laterality: N/A;  . BACK SURGERY    . HARDWARE REMOVAL N/A 10/01/2016   Procedure: Removal of pedical screws L4-5 HARDWARE REMOVAL;  Surgeon: Venita Lick, MD;  Location: [facility];  Service: Orthopedics;  Laterality: N/A;  . LUMBAR LAMINECTOMY/DECOMPRESSION MICRODISCECTOMY  2010       Home Medications    Prior to Admission medications   Medication Sig Start Date End Date Taking? Authorizing Provider  ondansetron (ZOFRAN ODT) 4 MG disintegrating tablet Take 1 tablet (4 mg total) by mouth every 8 (eight) hours as needed for nausea or vomiting. 02/03/21  Yes Rion Catala, Britta Mccreedy, MD  citalopram (CELEXA) 40 MG tablet Take 1 tablet (40 mg total) by mouth daily. 10/17/20   Jacoya Bauman Aspen, Limmie Patricia, MD  hydrochlorothiazide (HYDRODIURIL) 25 MG tablet Take 1 tablet (25 mg total) by mouth daily. 10/17/20   Jianna Drabik Aspen, Limmie Patricia,  MD  lisinopril (ZESTRIL) 20 MG tablet Take 1 tablet (20 mg total) by mouth daily. 10/17/20    Aspen, Limmie Patricia, MD  sertraline (ZOLOFT) 25 MG tablet Take 1 tablet (25 mg total) by mouth at bedtime. 11/07/20    Aspen, Limmie Patricia, MD  tadalafil (CIALIS) 20 MG tablet Take 1 tablet (20 mg total) by mouth daily as needed for erectile dysfunction. 11/07/20    Aspen, Limmie Patricia, MD  amitriptyline (ELAVIL) 25 MG tablet Take 1 tablet (25 mg total) by mouth at bedtime. 06/05/19 08/11/20  [place], Betty G, MD  cetirizine (ZYRTEC) 10 MG tablet Take 1 tablet (10 mg total) by mouth daily.  01/11/20 08/11/20  Dahlia Byes A, NP  fluticasone (FLONASE) 50 MCG/ACT nasal spray Place 2 sprays into both nostrils daily. 12/27/19 08/11/20  [place], Betty G, MD  ipratropium (ATROVENT) 0.06 % nasal spray Place 2 sprays into both nostrils 4 (four) times daily. 11/19/17 08/11/20  Ardith Dark, MD  rosuvastatin (CRESTOR) 40 MG tablet Take 1 tablet (40 mg total) by mouth daily. 10/11/18 08/11/20  Terressa Koyanagi, DO    Family History Family History  Problem Relation Age of Onset  . Heart disease Mother   . Hypertension Mother   . Arthritis Father   . Hypertension Father   . Prostate cancer Father   . Miscarriages / Stillbirths Maternal Grandmother   . Heart disease Maternal Grandfather   . Sudden death Maternal Grandfather   . Breast cancer Sister   . Migraines Sister     Social History Social History   Tobacco Use  . Smoking status: Never Smoker  . Smokeless tobacco: Never Used  Vaping Use  . Vaping Use: Never used  Substance Use Topics  . Alcohol use: Yes    Alcohol/week: 10.0 standard drinks    Types: 10 Cans of beer per week  . Drug use: No     Allergies   Codeine   Review of Systems Review of Systems  Constitutional: Positive for fatigue.  Respiratory: Negative.   Cardiovascular: Negative.   Neurological: Positive for dizziness and light-headedness. Negative for headaches.     Physical Exam Triage Vital Signs ED Triage Vitals [02/03/21 1529]  Enc Vitals Group     BP 123/87     Pulse Rate 98     Resp 20     Temp 98.3 F (36.8 C)     Temp Source Oral     SpO2 96 %     Weight      Height      Head Circumference      Peak Flow      Pain Score 0     Pain Loc      Pain Edu?      Excl. in GC?    No data found.  Updated Vital Signs BP 123/87 (BP Location: Left Arm)   Pulse 98   Temp 98.3 F (36.8 C) (Oral)   Resp 20   SpO2 96%   Visual Acuity Right Eye Distance:   Left Eye Distance:   Bilateral Distance:    Right Eye Near:   Left Eye Near:     Bilateral Near:     Physical Exam Vitals and nursing note reviewed.  Constitutional:      General: He is not in acute distress.    Appearance: He is not ill-appearing.  Cardiovascular:     Rate and Rhythm: Normal rate and regular rhythm.     Pulses: Normal  pulses.     Heart sounds: Normal heart sounds.  Musculoskeletal:        General: Normal range of motion.  Neurological:     Mental Status: He is alert.      UC Treatments / Results  Labs (all labs ordered are listed, but only abnormal results are displayed) Labs Reviewed  COMPREHENSIVE METABOLIC PANEL - Abnormal; Notable for the following components:      Result Value   Glucose, Bld 166 (*)    BUN 22 (*)    Creatinine, Ser 1.53 (*)    GFR, Estimated 55 (*)    All other components within normal limits  CBC WITH DIFFERENTIAL/PLATELET    EKG   Radiology No results found.  Procedures Procedures (including critical care time)  Medications Ordered in UC Medications - No data to display  Initial Impression / Assessment and Plan / UC Course  I have reviewed the triage vital signs and the nursing notes.  Pertinent labs & imaging results that were available during my care of the patient were reviewed by me and considered in my medical decision making (see chart for details).     1.  Fatigue, CBC, CMP We will call patient with results if labs are abnormal Increase oral fluid intake Return to urgent care if symptoms worsen Final Clinical Impressions(s) / UC Diagnoses   Final diagnoses:  Fatigue, unspecified type     Discharge Instructions     Increase oral fluid intake Take medications as prescribed We will call you with recommendations if labs are abnormal     ED Prescriptions    Medication Sig Dispense Auth. Provider   ondansetron (ZOFRAN ODT) 4 MG disintegrating tablet Take 1 tablet (4 mg total) by mouth every 8 (eight) hours as needed for nausea or vomiting. 20 tablet Jolie Strohecker, Britta Mccreedy, MD      PDMP not reviewed this encounter.   Merrilee Jansky, MD 02/04/21 2111    Merrilee Jansky, MD 02/04/21 2112

## 2021-02-03 NOTE — ED Triage Notes (Signed)
Pt presents with intermittent heart palpitations, fatigue, intermittent dizziness since yesterday.  Pt also complains of left arm numbness X 6 months.

## 2021-05-14 ENCOUNTER — Other Ambulatory Visit: Payer: Self-pay | Admitting: Internal Medicine

## 2021-05-14 DIAGNOSIS — I10 Essential (primary) hypertension: Secondary | ICD-10-CM

## 2021-05-16 ENCOUNTER — Telehealth: Payer: Medicare HMO | Admitting: Internal Medicine

## 2021-06-20 ENCOUNTER — Telehealth: Payer: Self-pay

## 2021-06-20 NOTE — Telephone Encounter (Signed)
Pt states he has not picked up medication that was refilled on 05/14/21 because he has enough; states he continues to take the medication as prescribed.  Pt also advised that appt needed. Pt states he will contact office to schedule when he is able.

## 2021-06-23 ENCOUNTER — Telehealth: Payer: Self-pay | Admitting: *Deleted

## 2021-06-23 NOTE — Chronic Care Management (AMB) (Signed)
  Chronic Care Management   Note  06/23/2021 Name: Gary Clark MRN: [id number] DOB: 1971/05/13  GAUTHAM HEWINS is a 50 y.o. year old male who is a primary care patient of Isaac Bliss, Rayford Halsted, MD. I reached out to Inez Pilgrim by phone today in response to a referral sent by Mr. AVID GUILLETTE PCP.  Mr. Mckowen was given information about Chronic Care Management services today including:  CCM service includes personalized support from designated clinical staff supervised by his physician, including individualized plan of care and coordination with other care providers 24/7 contact phone numbers for assistance for urgent and routine care needs. Service will only be billed when office clinical staff spend 20 minutes or more in a month to coordinate care. Only one practitioner may furnish and bill the service in a calendar month. The patient may stop CCM services at any time (effective at the end of the month) by phone call to the office staff. The patient is responsible for co-pay (up to 20% after annual deductible is met) if co-pay is required by the individual health plan.   Patient agreed to services and verbal consent obtained.   Follow up plan: Telephone appointment with care management team member scheduled for:06/27/21  [facility]  Direct Dial: [phone]

## 2021-06-27 ENCOUNTER — Telehealth: Payer: Medicare HMO

## 2021-06-27 ENCOUNTER — Telehealth: Payer: Self-pay

## 2021-06-27 NOTE — Telephone Encounter (Signed)
  Care Management   Initial Outreach Note   06/27/2021 Name: CYAN MOULTRIE MRN: [id number] DOB: 12-14-1970   Referred by: Philip Aspen, Limmie Patricia, MD Reason for referral : Chronic Care Management (RNCM: Initial Outreach Chronic Care Management and coordination needs-unsuccessful outreach)   An unsuccessful telephone outreach was attempted today. The patient was referred to the case management team for assistance with care management and care coordination.   Follow Up Plan: The care management team will reach out to the patient again over the next 30 days.   Dudley Major RN, Maximiano Coss, CDE Care Management Coordinator [facility] [phone], Mobile [phone]

## 2021-07-07 ENCOUNTER — Ambulatory Visit (INDEPENDENT_AMBULATORY_CARE_PROVIDER_SITE_OTHER): Payer: Medicare HMO

## 2021-07-07 DIAGNOSIS — F3340 Major depressive disorder, recurrent, in remission, unspecified: Secondary | ICD-10-CM

## 2021-07-07 DIAGNOSIS — I10 Essential (primary) hypertension: Secondary | ICD-10-CM

## 2021-07-07 DIAGNOSIS — E785 Hyperlipidemia, unspecified: Secondary | ICD-10-CM

## 2021-07-07 DIAGNOSIS — M549 Dorsalgia, unspecified: Secondary | ICD-10-CM

## 2021-07-07 NOTE — Patient Instructions (Signed)
Visit Information  Chronic Pain, Adult Chronic pain is a type of pain that lasts or keeps coming back for at least 3-6 months. You may have headaches, pain in the abdomen, or pain in other areas of the body. Chronic pain may be related to an illness, such as fibromyalgia or complex regional pain syndrome. Chronic pain may also be related to an injury or a health condition. Sometimes, the cause of chronic pain is not known. Chronic pain can make it hard for you to do daily activities. If not treated, chronic pain can lead to anxiety and depression. Treatment depends on the cause and severity of your pain. You may need to work with a pain specialist to come up with a treatment plan. The plan may include medicine, counseling, and physical therapy. Many people benefit from a combination of two or more types of treatment to control their pain. Follow these instructions at home: Medicines Take over-the-counter and prescription medicines only as told by your health care provider. Ask your health care provider if the medicine prescribed to you: Requires you to avoid driving or using machinery. Can cause constipation. You may need to take these actions to prevent or treat constipation: Drink enough fluid to keep your urine pale yellow. Take over-the-counter or prescription medicines. Eat foods that are high in fiber, such as beans, whole grains, and fresh fruits and vegetables. Limit foods that are high in fat and processed sugars, such as fried or sweet foods. Treatment plan Follow your treatment plan as told by your health care provider. This may include: Gentle, regular exercise. Eating a healthy diet that includes foods such as vegetables, fruits, fish, and lean meats. Cognitive or behavioral therapy that changes the way you think or act in response to the pain. This may help improve how you feel. Working with a physical therapist. Meditation, yoga, acupuncture, or massage therapy. Aroma, color,  light, or sound therapy. Local electrical stimulation. The electrical pulses help to relieve pain by temporarily stopping the nerve impulses that cause you to feel pain. Injections. These deliver numbing or pain-relieving medicines into the spine or the area of pain.  Lifestyle  Ask your health care provider whether you should keep a pain diary. Your health care provider will tell you what information to write in the diary. This may include when you have pain, what the pain feels like, and how medicines and other behaviors or treatments help to reduce the pain. Consider talking with a mental health care provider about how to manage chronic pain. Consider joining a chronic pain support group. Try to control or lower your stress levels. Talk with your health care provider about ways to do this. General instructions Learn as much as you can about how to manage your chronic pain. Ask your health care provider if an intensive pain rehabilitation program or a chronic pain specialist would be helpful. Check your pain level as told by your health care provider. Ask your health care provider if you should use a pain scale. It is up to you to get the results of any tests that were done. Ask your health care provider, or the department that is doing the tests, when your results will be ready. Keep all follow-up visits as told by your health care provider. This is important. Contact a health care provider if: Your pain gets worse, or you have new pain. You have trouble sleeping. You have trouble doing your normal activities. Your pain is not controlled with treatment. You have  side effects from pain medicine. You feel weak. You notice any other changes that show that your condition is getting worse. Get help right away if: You lose feeling or have numbness in your body. You lose control of bowel or bladder function. Your pain suddenly gets much worse. You develop shaking or chills. You develop  confusion. You develop chest pain. You have trouble breathing or shortness of breath. You pass out. You have thoughts about hurting yourself or others. If you ever feel like you may hurt yourself or others, or have thoughts about taking your own life, get help right away. Go to your nearest emergency department or: Call your local emergency services (911 in the [place] Call a suicide crisis helpline, such as the [place] at 214-524-8461. This is open 24 hours a day in the [place] Text the Crisis Text Line at 347-034-5775 (in the [place].). Summary Chronic pain is a type of pain that lasts or keeps coming back for at least 3-6 months. Chronic pain may be related to an illness, injury, or other health condition. Sometimes, the cause of chronic pain is not known. Treatment depends on the cause and severity of your pain. Many people benefit from a combination of two or more types of treatment to control their pain. Follow your treatment plan as told by your health care provider. This information is not intended to replace advice given to you by your health care provider. Make sure you discuss any questions you have with your health care provider. Document Revised: 05/18/2019 Document Reviewed: 05/18/2019 Elsevier Patient Education  2022 [employer].  Hypertension, Adult Hypertension is another name for high blood pressure. High blood pressure forces your heart to work harder to pump blood. This can cause problems over time. There are two numbers in a blood pressure reading. There is a top number (systolic) over a bottom number (diastolic). It is best to have a blood pressure that is below 120/80. Healthy choices can help lower your blood pressure, or you may need medicine to help lower it. What are the causes? The cause of this condition is not known. Some conditions may be related to high blood pressure. What increases the risk? Smoking. Having type 2 diabetes mellitus, high  cholesterol, or both. Not getting enough exercise or physical activity. Being overweight. Having too much fat, sugar, calories, or salt (sodium) in your diet. Drinking too much alcohol. Having long-term (chronic) kidney disease. Having a family history of high blood pressure. Age. Risk increases with age. Race. You may be at higher risk if you are African American. Gender. Men are at higher risk than women before age 58. After age 100, women are at higher risk than men. Having obstructive sleep apnea. Stress. What are the signs or symptoms? High blood pressure may not cause symptoms. Very high blood pressure (hypertensive crisis) may cause: Headache. Feelings of worry or nervousness (anxiety). Shortness of breath. Nosebleed. A feeling of being sick to your stomach (nausea). Throwing up (vomiting). Changes in how you see. Very bad chest pain. Seizures. How is this treated? This condition is treated by making healthy lifestyle changes, such as: Eating healthy foods. Exercising more. Drinking less alcohol. Your health care provider may prescribe medicine if lifestyle changes are not enough to get your blood pressure under control, and if: Your top number is above 130. Your bottom number is above 80. Your personal target blood pressure may vary. Follow these instructions at home: Eating and drinking  If told, follow  the DASH eating plan. To follow this plan: Fill one half of your plate at each meal with fruits and vegetables. Fill one fourth of your plate at each meal with whole grains. Whole grains include whole-wheat pasta, brown rice, and whole-grain bread. Eat or drink low-fat dairy products, such as skim milk or low-fat yogurt. Fill one fourth of your plate at each meal with low-fat (lean) proteins. Low-fat proteins include fish, chicken without skin, eggs, beans, and tofu. Avoid fatty meat, cured and processed meat, or chicken with skin. Avoid pre-made or processed  food. Eat less than 1,500 mg of salt each day. Do not drink alcohol if: Your doctor tells you not to drink. You are pregnant, may be pregnant, or are planning to become pregnant. If you drink alcohol: Limit how much you use to: 0-1 drink a day for women. 0-2 drinks a day for men. Be aware of how much alcohol is in your drink. In the [place] one drink equals one 12 oz bottle of beer (355 mL), one 5 oz glass of wine (148 mL), or one 1 oz glass of hard liquor (44 mL). Lifestyle  Work with your doctor to stay at a healthy weight or to lose weight. Ask your doctor what the best weight is for you. Get at least 30 minutes of exercise most days of the week. This may include walking, swimming, or biking. Get at least 30 minutes of exercise that strengthens your muscles (resistance exercise) at least 3 days a week. This may include lifting weights or doing Pilates. Do not use any products that contain nicotine or tobacco, such as cigarettes, e-cigarettes, and chewing tobacco. If you need help quitting, ask your doctor. Check your blood pressure at home as told by your doctor. Keep all follow-up visits as told by your doctor. This is important. Medicines Take over-the-counter and prescription medicines only as told by your doctor. Follow directions carefully. Do not skip doses of blood pressure medicine. The medicine does not work as well if you skip doses. Skipping doses also puts you at risk for problems. Ask your doctor about side effects or reactions to medicines that you should watch for. Contact a doctor if you: Think you are having a reaction to the medicine you are taking. Have headaches that keep coming back (recurring). Feel dizzy. Have swelling in your ankles. Have trouble with your vision. Get help right away if you: Get a very bad headache. Start to feel mixed up (confused). Feel weak or numb. Feel faint. Have very bad pain in your: Chest. Belly (abdomen). Throw up more than  once. Have trouble breathing. Summary Hypertension is another name for high blood pressure. High blood pressure forces your heart to work harder to pump blood. For most people, a normal blood pressure is less than 120/80. Making healthy choices can help lower blood pressure. If your blood pressure does not get lower with healthy choices, you may need to take medicine. This information is not intended to replace advice given to you by your health care provider. Make sure you discuss any questions you have with your health care provider. Document Revised: 05/11/2018 Document Reviewed: 05/11/2018 Elsevier Patient Education  2022 [employer].   Consent to CCM Services: Gary Clark was given information about Chronic Care Management services including:  CCM service includes personalized support from designated clinical staff supervised by his physician, including individualized plan of care and coordination with other care providers 24/7 contact phone numbers for assistance for urgent and routine  care needs. Service will only be billed when office clinical staff spend 20 minutes or more in a month to coordinate care. Only one practitioner may furnish and bill the service in a calendar month. The patient may stop CCM services at any time (effective at the end of the month) by phone call to the office staff. The patient will be responsible for cost sharing (co-pay) of up to 20% of the service fee (after annual deductible is met).  Patient agreed to services and verbal consent obtained.   Patient verbalizes understanding of instructions provided today and agrees to view in [place].   Telephone follow up appointment with care management team member scheduled for:  Gary Garter RN, [facility], CDE Care Management Coordinator [facility] [phone], Mobile [phone]   CLINICAL CARE PLAN: Patient Care Plan: Chronic Disease Management and Care Coordination Needs(HTN,  HLD, depression)     Problem Identified: Chronic Disease Management and Care Coordination Needs (HTN, HLD, depression)   Priority: High     Long-Range Goal: Chronic Disease Management and Care Coordination Needs (HTN, HLD, depression)   Start Date: 07/07/2021  Expected End Date: 01/03/2022  This Visit's Progress: On track  Priority: High  Note:   Current Barriers:  Care Coordination needs related to Mental Health Concerns  and caregiver stress Chronic Disease Management support and education needs related to HTN, HLD, and Depression chronic pain Pt states that he has a lot of stress, anxiety and some depression as he has to care for his 28 yr old son with autism and his elderly parents are now living with him.  He is in the process of getting divorced.  He does not check his B/P at home and tends to eat to help relieve his stress  RNCM Clinical Goal(s):  Patient will verbalize understanding of plan for management of HTN, HLD, Depression, and chronic pain verbalize basic understanding of  HTN, HLD, Depression, and chronic pain disease process and self health management plan . take all medications exactly as prescribed and will call provider for medication related questions continue to work with RN Care Manager to address care management and care coordination needs related to  HTN, HLD, Depression, and chronic pain work with Education officer, museum to address  related to the management of [place] Concerns  and caregiver stress related to the management of HTN, HLD, Depression, and chronic pain  through collaboration with Consulting civil engineer, provider, and care team.   Interventions: 1:1 collaboration with primary care provider regarding development and update of comprehensive plan of care as evidenced by provider attestation and co-signature Inter-disciplinary care team collaboration (see longitudinal plan of care) Evaluation of current treatment plan related to  self management and patient's  adherence to plan as established by provider All new goals   Pain Interventions: Pain assessment performed Medications reviewed Reviewed provider established plan for pain management; Counseled on the importance of reporting any/all new or changed pain symptoms or management strategies to pain management provider; Advised patient to report to care team affect of pain on daily activities; Reviewed with patient prescribed pharmacological and nonpharmacological pain relief strategies; Screening for signs and symptoms of depression related to chronic disease state;  Assessed social determinant of health barriers;  Referred to CCM LCSW for issues with depression and caregiver stress  Hypertension Interventions: Last practice recorded BP readings:  BP Readings from Last 3 Encounters:  02/03/21 123/87  08/11/20 (!) 143/97  01/26/20 124/84  Most recent eGFR/CrCl: No results found for: EGFR  No components found for: CRCL  Evaluation of current treatment plan related to hypertension self management and patient's adherence to plan as established by provider; Provided education to patient re: stroke prevention, s/s of heart attack and stroke; Reviewed medications with patient and discussed importance of compliance; Provided assistance with obtaining home blood pressure monitor via insurance benefit;  Counseled on the importance of exercise goals with target of 150 minutes per week Advised patient, providing education and rationale, to monitor blood pressure daily and record, calling PCP for findings outside established parameters;  Provided education on prescribed diet low sodium;   Hyperlipidemia Interventions: Medication review performed; medication list updated in electronic medical record.  Provider established cholesterol goals reviewed; Counseled on importance of regular laboratory monitoring as prescribed; Reviewed importance of limiting foods high in cholesterol; Reviewed exercise  goals and target of 150 minutes per week;  Patient Goals/Self-Care Activities: Patient will self administer medications as prescribed Patient will attend all scheduled provider appointments Patient will call pharmacy for medication refills Patient will call provider office for new concerns or questions  Follow Up Plan:  Telephone follow up appointment with care management team member scheduled for:  08/05/21/ at 10 AM The patient has been provided with contact information for the care management team and has been advised to call with any health related questions or concerns.

## 2021-07-07 NOTE — Progress Notes (Signed)
Scheduled w LCSW for 11/3  [facility] Guide, Embedded Care Coordination [facility] Management  Direct Dial: [phone]

## 2021-07-07 NOTE — Chronic Care Management (AMB) (Signed)
Chronic Care Management   CCM RN Visit Note  07/07/2021 Name: Gary Clark MRN: [id number] DOB: 1970-11-20  Subjective: Gary Clark is a 50 y.o. year old male who is a primary care patient of Isaac Bliss, Rayford Halsted, MD. The care management team was consulted for assistance with disease management and care coordination needs.    Engaged with patient by telephone for initial visit in response to provider referral for case management and/or care coordination services.   Consent to Services:  The patient was given the following information about Chronic Care Management services today, agreed to services, and gave verbal consent: 1. CCM service includes personalized support from designated clinical staff supervised by the primary care provider, including individualized plan of care and coordination with other care providers 2. 24/7 contact phone numbers for assistance for urgent and routine care needs. 3. Service will only be billed when office clinical staff spend 20 minutes or more in a month to coordinate care. 4. Only one practitioner may furnish and bill the service in a calendar month. 5.The patient may stop CCM services at any time (effective at the end of the month) by phone call to the office staff. 6. The patient will be responsible for cost sharing (co-pay) of up to 20% of the service fee (after annual deductible is met). Patient agreed to services and consent obtained.  Patient agreed to services and verbal consent obtained.   Assessment: Review of patient past medical history, allergies, medications, health status, including review of consultants reports, laboratory and other test data, was performed as part of comprehensive evaluation and provision of chronic care management services.   SDOH (Social Determinants of Health) assessments and interventions performed:  SDOH Interventions    Flowsheet Row Most Recent Value  SDOH Interventions   Food Insecurity Interventions  Intervention Not Indicated  Financial Strain Interventions Intervention Not Indicated  Housing Interventions Intervention Not Indicated  Physical Activity Interventions Patient Refused  Stress Interventions Provide Counseling  [referred to LCSW]  Transportation Interventions Intervention Not Indicated  Depression Interventions/Treatment  Medication        CCM Care Plan  Allergies  Allergen Reactions   Codeine Other (See Comments)    Gets "loopy"    Outpatient Encounter Medications as of 07/07/2021  Medication Sig   citalopram (CELEXA) 40 MG tablet Take 1 tablet (40 mg total) by mouth daily.   hydrochlorothiazide (HYDRODIURIL) 25 MG tablet Take 1 tablet by mouth once daily   lisinopril (ZESTRIL) 20 MG tablet Take 1 tablet by mouth once daily   ondansetron (ZOFRAN ODT) 4 MG disintegrating tablet Take 1 tablet (4 mg total) by mouth every 8 (eight) hours as needed for nausea or vomiting.   sertraline (ZOLOFT) 25 MG tablet Take 1 tablet (25 mg total) by mouth at bedtime.   tadalafil (CIALIS) 20 MG tablet Take 1 tablet (20 mg total) by mouth daily as needed for erectile dysfunction.   [DISCONTINUED] amitriptyline (ELAVIL) 25 MG tablet Take 1 tablet (25 mg total) by mouth at bedtime.   [DISCONTINUED] cetirizine (ZYRTEC) 10 MG tablet Take 1 tablet (10 mg total) by mouth daily.   [DISCONTINUED] fluticasone (FLONASE) 50 MCG/ACT nasal spray Place 2 sprays into both nostrils daily.   [DISCONTINUED] ipratropium (ATROVENT) 0.06 % nasal spray Place 2 sprays into both nostrils 4 (four) times daily.   [DISCONTINUED] rosuvastatin (CRESTOR) 40 MG tablet Take 1 tablet (40 mg total) by mouth daily.   No facility-administered encounter medications on file as of 07/07/2021.  Patient Active Problem List   Diagnosis Date Noted   S/P lumbar fusion 11/04/2018   Degeneration of lumbar intervertebral disc 10/27/2017   History of lumbar fusion 10/27/2017   Back pain 10/01/2016   Lumbar radiculitis  07/17/2016   Lumbosacral radiculitis 05/19/2016   Patellofemoral syndrome of both knees 05/19/2016   Sacroiliac joint dysfunction of both sides 02/04/2016   Obesity 06/03/2015   Mild intermittent asthma 06/03/2015   Major depressive disorder, recurrent, in remission ([place]) 06/03/2015   Postlaminectomy syndrome, lumbar region 03/08/2015   Migraine/Cluster HAs 10/14/2012   Chronic low back pain - hx spinal stenosis, decompression surgery in 2010, 2011 10/14/2012   Hyperlipemia 10/14/2012   Hypertension 10/14/2012    Conditions to be addressed/monitored:HTN, HLD, Depression, and chronic pain  Care Plan : Chronic Disease Management and Care Coordination Needs(HTN, HLD, depression)  Updates made by Dimitri Ped, RN since 07/07/2021 12:00 AM     Problem: Chronic Disease Management and Care Coordination Needs (HTN, HLD, depression)   Priority: High     Long-Range Goal: Chronic Disease Management and Care Coordination Needs (HTN, HLD, depression)   Start Date: 07/07/2021  Expected End Date: 01/03/2022  This Visit's Progress: On track  Priority: High  Note:   Current Barriers:  Care Coordination needs related to Mental Health Concerns  and caregiver stress Chronic Disease Management support and education needs related to HTN, HLD, and Depression chronic pain Pt states that he has a lot of stress, anxiety and some depression as he has to care for his 37 yr old son with autism and his elderly parents are now living with him.  He is in the process of getting divorced.  He does not check his B/P at home and tends to eat to help relieve his stress  RNCM Clinical Goal(s):  Patient will verbalize understanding of plan for management of HTN, HLD, Depression, and chronic pain verbalize basic understanding of  HTN, HLD, Depression, and chronic pain disease process and self health management plan . take all medications exactly as prescribed and will call provider for medication related  questions continue to work with RN Care Manager to address care management and care coordination needs related to  HTN, HLD, Depression, and chronic pain work with Education officer, museum to address  related to the management of [place] Concerns  and caregiver stress related to the management of HTN, HLD, Depression, and chronic pain  through collaboration with Consulting civil engineer, provider, and care team.   Interventions: 1:1 collaboration with primary care provider regarding development and update of comprehensive plan of care as evidenced by provider attestation and co-signature Inter-disciplinary care team collaboration (see longitudinal plan of care) Evaluation of current treatment plan related to  self management and patient's adherence to plan as established by provider All new goals   Pain Interventions: Pain assessment performed Medications reviewed Reviewed provider established plan for pain management; Counseled on the importance of reporting any/all new or changed pain symptoms or management strategies to pain management provider; Advised patient to report to care team affect of pain on daily activities; Reviewed with patient prescribed pharmacological and nonpharmacological pain relief strategies; Screening for signs and symptoms of depression related to chronic disease state;  Assessed social determinant of health barriers;  Referred to CCM LCSW for issues with depression and caregiver stress  Hypertension Interventions: Last practice recorded BP readings:  BP Readings from Last 3 Encounters:  02/03/21 123/87  08/11/20 (!) 143/97  01/26/20 124/84  Most recent eGFR/CrCl: No  results found for: EGFR  No components found for: CRCL  Evaluation of current treatment plan related to hypertension self management and patient's adherence to plan as established by provider; Provided education to patient re: stroke prevention, s/s of heart attack and stroke; Reviewed medications with patient  and discussed importance of compliance; Provided assistance with obtaining home blood pressure monitor via insurance benefit;  Counseled on the importance of exercise goals with target of 150 minutes per week Advised patient, providing education and rationale, to monitor blood pressure daily and record, calling PCP for findings outside established parameters;  Provided education on prescribed diet low sodium;   Hyperlipidemia Interventions: Medication review performed; medication list updated in electronic medical record.  Provider established cholesterol goals reviewed; Counseled on importance of regular laboratory monitoring as prescribed; Reviewed importance of limiting foods high in cholesterol; Reviewed exercise goals and target of 150 minutes per week;  Patient Goals/Self-Care Activities: Patient will self administer medications as prescribed Patient will attend all scheduled provider appointments Patient will call pharmacy for medication refills Patient will call provider office for new concerns or questions  Follow Up Plan:  Telephone follow up appointment with care management team member scheduled for:  08/05/21/ at 10 AM The patient has been provided with contact information for the care management team and has been advised to call with any health related questions or concerns.       Plan:Telephone follow up appointment with care management team member scheduled for:  08/05/21 The patient has been provided with contact information for the care management team and has been advised to call with any health related questions or concerns.  Peter Garter RN, Jackquline Denmark, CDE Care Management Coordinator [facility] [phone], [place] [phone]

## 2021-07-14 DIAGNOSIS — I10 Essential (primary) hypertension: Secondary | ICD-10-CM | POA: Diagnosis not present

## 2021-07-14 DIAGNOSIS — F3340 Major depressive disorder, recurrent, in remission, unspecified: Secondary | ICD-10-CM | POA: Diagnosis not present

## 2021-07-14 DIAGNOSIS — E785 Hyperlipidemia, unspecified: Secondary | ICD-10-CM | POA: Diagnosis not present

## 2021-07-17 ENCOUNTER — Ambulatory Visit (INDEPENDENT_AMBULATORY_CARE_PROVIDER_SITE_OTHER): Payer: Medicare HMO | Admitting: *Deleted

## 2021-07-17 DIAGNOSIS — M545 Low back pain, unspecified: Secondary | ICD-10-CM

## 2021-07-17 DIAGNOSIS — E785 Hyperlipidemia, unspecified: Secondary | ICD-10-CM

## 2021-07-17 DIAGNOSIS — M5136 Other intervertebral disc degeneration, lumbar region: Secondary | ICD-10-CM

## 2021-07-17 DIAGNOSIS — I10 Essential (primary) hypertension: Secondary | ICD-10-CM

## 2021-07-17 DIAGNOSIS — F3340 Major depressive disorder, recurrent, in remission, unspecified: Secondary | ICD-10-CM

## 2021-07-17 DIAGNOSIS — Z981 Arthrodesis status: Secondary | ICD-10-CM

## 2021-07-17 DIAGNOSIS — G8929 Other chronic pain: Secondary | ICD-10-CM

## 2021-07-17 NOTE — Patient Instructions (Signed)
Visit Information   PATIENT GOALS/PLAN OF CARE:  Care Plan : LCSW Plan of Care  Updates made by Francis Gaines, LCSW since 07/17/2021 12:00 AM     Problem: Reduce and Manage My Symptoms of Anxiety and Depression.   Priority: High     Goal: Reduce and Manage My Symptoms of Anxiety and Depression.   Start Date: 07/17/2021  Expected End Date: 10/17/2021  This Visit's Progress: On track  Priority: High  Note:   Current Barriers:   Acute Mental Health needs related to Caregiver Stress, Marital Discord, Chronic Low Back Pain, Recurrent Major Depressive Disorder, Obesity and Anxiety, requires Support, Education, Resources, Referrals and Care Coordination in order to meet unmet mental health needs. Clinical Goal(s):  Patient will work with LCSW to reduce and manage symptoms of Anxiety and Depression, until well-controlled.     Patient will increase knowledge and/or ability of:        Coping Skills, Healthy Habits, Self-Management Skills, Stress Reduction, Home Safety and Utilizing [employer] and Resources.   Clinical Interventions:  Assessed patient's previous treatment, needs, coping skills, current treatment, support system and barriers to care. PHQ-2 and PHQ-9 Depression Screening Tool performed and results reviewed with patient. Mindfulness Meditation Strategies, Relaxation Techniques and Deep Breathing Exercises taught and encouraged, daily. Solution-Focused Therapy performed, Verbalization of Feelings encouraged, Emotional Support provided, Problem Solving Solutions developed, Brief Cognitive Behavioral Therapy initiated, Quality of Sleep assessed and Sleep Hygiene Techniques promoted, Support Group Participation encouraged, Increase Level of Activity/Exercise emphasized.   Suicidal and Homicidal Assessment performed - none present.  Discussed plans with patient for ongoing care management follow-up and provided patient with direct contact information for care management  team. Discussed several options for long-term counseling based on need and insurance, but patient denies the need for long-term counseling services, nor is he interested in being referred to [facility], or any other community mental health provider.   Collaboration with Primary Care Physician, Dr. Lelon Frohlich regarding development and update of comprehensive plan of care as evidenced by provider attestation and co-signature. Inter-disciplinary care team collaboration (see longitudinal plan of care). Patient Goals/Self-Care Activities: Begin personal counseling with LCSW, on a weekly/bi-weekly basis, to reduce and manage symptoms of Anxiety and Depression, until well-controlled.   Incorporate into daily practice - relaxation techniques, deep breathing exercises and mindfulness meditation strategies. Consider self-enrollment in a caregiver support group, from the list provided.   Continue with compliance of taking psychotropic medications exactly as prescribed. Contact LCSW directly (# [phone]) if you have questions, need assistance, or if additional social work needs are identified between now and our next scheduled telephone outreach call.  Follow-Up:  07/28/2021 at 3:00pm        Consent to CCM Services: Gary Clark was given information about Chronic Care Management services including:  CCM service includes personalized support from designated clinical staff supervised by his physician, including individualized plan of care and coordination with other care providers 24/7 contact phone numbers for assistance for urgent and routine care needs. Service will only be billed when office clinical staff spend 20 minutes or more in a month to coordinate care. Only one practitioner may furnish and bill the service in a calendar month. The patient may stop CCM services at any time (effective at the end of the month) by phone call to the office staff. The patient will be responsible  for cost sharing (co-pay) of up to 20% of the service fee (after annual deductible is met).  Patient  agreed to services and verbal consent obtained.   Patient verbalizes understanding of instructions provided today and agrees to view in [place].   Telephone follow up appointment with care management team member scheduled for:  07/28/2021 at 3:00pm  [place] Licensed Clinical Social Worker [place] [phone]

## 2021-07-17 NOTE — Chronic Care Management (AMB) (Signed)
Chronic Care Management    Clinical Social Work Note  07/17/2021 Name: Gary Clark MRN: [id number] DOB: [birth year]  Gary Clark is a 50 y.o. year old male who is a primary care patient of Philip Aspen, Limmie Patricia, MD. The CCM team was consulted to assist the patient with chronic disease management and/or care coordination needs related to: [employer], Mental Health Counseling and Resources, and Caregiver Stress.   Engaged with patient by telephone for initial visit in response to provider referral for social work chronic care management and care coordination services.   Consent to Services:  The patient was given information about Chronic Care Management services, agreed to services, and gave verbal consent prior to initiation of services.  Please see initial visit note for detailed documentation.   Patient agreed to services and consent obtained.   Assessment: Review of patient past medical history, allergies, medications, and health status, including review of relevant consultants reports was performed today as part of a comprehensive evaluation and provision of chronic care management and care coordination services.     SDOH (Social Determinants of Health) assessments and interventions performed:  SDOH Interventions    Flowsheet Row Most Recent Value  SDOH Interventions   Food Insecurity Interventions Intervention Not Indicated  Financial Strain Interventions Intervention Not Indicated  Housing Interventions Intervention Not Indicated  Intimate Partner Violence Interventions Intervention Not Indicated  Physical Activity Interventions Patient Refused  Stress Interventions Offered Community Wellness Resources, Provide Counseling  Social Connections Interventions Intervention Not Indicated  Transportation Interventions Intervention Not Indicated  Depression Interventions/Treatment  Medication, Counseling        Advanced Directives Status: Not ready or willing to  discuss.  CCM Care Plan  Allergies  Allergen Reactions   Codeine Other (See Comments)    Gets "loopy"    Outpatient Encounter Medications as of 07/17/2021  Medication Sig   citalopram (CELEXA) 40 MG tablet Take 1 tablet (40 mg total) by mouth daily.   hydrochlorothiazide (HYDRODIURIL) 25 MG tablet Take 1 tablet by mouth once daily   lisinopril (ZESTRIL) 20 MG tablet Take 1 tablet by mouth once daily   ondansetron (ZOFRAN ODT) 4 MG disintegrating tablet Take 1 tablet (4 mg total) by mouth every 8 (eight) hours as needed for nausea or vomiting.   sertraline (ZOLOFT) 25 MG tablet Take 1 tablet (25 mg total) by mouth at bedtime.   tadalafil (CIALIS) 20 MG tablet Take 1 tablet (20 mg total) by mouth daily as needed for erectile dysfunction.   [DISCONTINUED] amitriptyline (ELAVIL) 25 MG tablet Take 1 tablet (25 mg total) by mouth at bedtime.   [DISCONTINUED] cetirizine (ZYRTEC) 10 MG tablet Take 1 tablet (10 mg total) by mouth daily.   [DISCONTINUED] fluticasone (FLONASE) 50 MCG/ACT nasal spray Place 2 sprays into both nostrils daily.   [DISCONTINUED] ipratropium (ATROVENT) 0.06 % nasal spray Place 2 sprays into both nostrils 4 (four) times daily.   [DISCONTINUED] rosuvastatin (CRESTOR) 40 MG tablet Take 1 tablet (40 mg total) by mouth daily.   No facility-administered encounter medications on file as of 07/17/2021.    Patient Active Problem List   Diagnosis Date Noted   S/P lumbar fusion 11/04/2018   Degeneration of lumbar intervertebral disc 10/27/2017   History of lumbar fusion 10/27/2017   Back pain 10/01/2016   Lumbar radiculitis 07/17/2016   Lumbosacral radiculitis 05/19/2016   Patellofemoral syndrome of both knees 05/19/2016   Sacroiliac joint dysfunction of both sides 02/04/2016   Obesity 06/03/2015   Mild  intermittent asthma 06/03/2015   Major depressive disorder, recurrent, in remission ([facility]) 06/03/2015   Postlaminectomy syndrome, lumbar region 03/08/2015   Migraine/Cluster  HAs 10/14/2012   Chronic low back pain - hx spinal stenosis, decompression surgery in 2010, 2011 10/14/2012   Hyperlipemia 10/14/2012   Hypertension 10/14/2012    Conditions to be addressed/monitored: Anxiety and Depression; Limited Social Support, Caregiver Stress, Limited Access to Caregiver, Mental Health Concerns, Social Isolation, Marital Discord, Lacks Knowledge of [employer].  Care Plan : LCSW Plan of Care  Updates made by Karolee Stamps, LCSW since 07/17/2021 12:00 AM     Problem: Reduce and Manage My Symptoms of Anxiety and Depression.   Priority: High     Goal: Reduce and Manage My Symptoms of Anxiety and Depression.   Start Date: 07/17/2021  Expected End Date: 10/17/2021  This Visit's Progress: On track  Priority: High  Note:   Current Barriers:   Acute Mental Health needs related to Caregiver Stress, Marital Discord, Chronic Low Back Pain, Recurrent Major Depressive Disorder, Obesity and Anxiety, requires Support, Education, Resources, Referrals and Care Coordination in order to meet unmet mental health needs. Clinical Goal(s):  Patient will work with LCSW to reduce and manage symptoms of Anxiety and Depression, until well-controlled.     Patient will increase knowledge and/or ability of:        Coping Skills, Healthy Habits, Self-Management Skills, Stress Reduction, Home Safety and Utilizing [employer] and Resources.   Clinical Interventions:  Assessed patient's previous treatment, needs, coping skills, current treatment, support system and barriers to care. PHQ-2 and PHQ-9 Depression Screening Tool performed and results reviewed with patient. Mindfulness Meditation Strategies, Relaxation Techniques and Deep Breathing Exercises taught and encouraged, daily. Solution-Focused Therapy performed, Verbalization of Feelings encouraged, Emotional Support provided, Problem Solving Solutions developed, Brief Cognitive Behavioral Therapy initiated, Quality of Sleep  assessed and Sleep Hygiene Techniques promoted, Support Group Participation encouraged, Increase Level of Activity/Exercise emphasized.   Suicidal and Homicidal Assessment performed - none present.  Discussed plans with patient for ongoing care management follow-up and provided patient with direct contact information for care management team. Discussed several options for long-term counseling based on need and insurance, but patient denies the need for long-term counseling services, nor is he interested in being referred to [facility], or any other community mental health provider.   Collaboration with Primary Care Physician, Dr. Chaya Jan regarding development and update of comprehensive plan of care as evidenced by provider attestation and co-signature. Inter-disciplinary care team collaboration (see longitudinal plan of care). Patient Goals/Self-Care Activities: Begin personal counseling with LCSW, on a weekly/bi-weekly basis, to reduce and manage symptoms of Anxiety and Depression, until well-controlled.   Incorporate into daily practice - relaxation techniques, deep breathing exercises and mindfulness meditation strategies. Consider self-enrollment in a caregiver support group, from the list provided.   Continue with compliance of taking psychotropic medications exactly as prescribed. Contact LCSW directly (# [phone]) if you have questions, need assistance, or if additional social work needs are identified between now and our next scheduled telephone outreach call.  Follow-Up:  07/28/2021 at 3:00pm      Danford Bad LCSW Licensed Clinical Social Worker [facility] [phone]

## 2021-07-21 ENCOUNTER — Ambulatory Visit: Payer: Medicare HMO | Admitting: *Deleted

## 2021-07-21 DIAGNOSIS — M51369 Other intervertebral disc degeneration, lumbar region without mention of lumbar back pain or lower extremity pain: Secondary | ICD-10-CM

## 2021-07-21 DIAGNOSIS — Z981 Arthrodesis status: Secondary | ICD-10-CM

## 2021-07-21 DIAGNOSIS — M533 Sacrococcygeal disorders, not elsewhere classified: Secondary | ICD-10-CM

## 2021-07-21 DIAGNOSIS — E785 Hyperlipidemia, unspecified: Secondary | ICD-10-CM

## 2021-07-21 DIAGNOSIS — G8929 Other chronic pain: Secondary | ICD-10-CM

## 2021-07-21 DIAGNOSIS — M5417 Radiculopathy, lumbosacral region: Secondary | ICD-10-CM

## 2021-07-21 DIAGNOSIS — Z6836 Body mass index (BMI) 36.0-36.9, adult: Secondary | ICD-10-CM

## 2021-07-21 DIAGNOSIS — I10 Essential (primary) hypertension: Secondary | ICD-10-CM

## 2021-07-21 DIAGNOSIS — F3340 Major depressive disorder, recurrent, in remission, unspecified: Secondary | ICD-10-CM

## 2021-07-21 DIAGNOSIS — M545 Low back pain, unspecified: Secondary | ICD-10-CM

## 2021-07-21 DIAGNOSIS — M5136 Other intervertebral disc degeneration, lumbar region: Secondary | ICD-10-CM

## 2021-07-21 DIAGNOSIS — M5416 Radiculopathy, lumbar region: Secondary | ICD-10-CM

## 2021-07-21 NOTE — Chronic Care Management (AMB) (Signed)
Chronic Care Management    Clinical Social Work Note  07/21/2021 Name: Gary Clark MRN: [id number] DOB: 1971-02-18  Gary Clark is a 50 y.o. year old male who is a primary care patient of Philip Aspen, Limmie Patricia, MD. The CCM team was consulted to assist the patient with chronic disease management and/or care coordination needs related to: [employer] , Mental Health Counseling and Resources, and Caregiver Stress.   Engaged with patient by telephone for follow up visit in response to provider referral for social work chronic care management and care coordination services.   Consent to Services:  The patient was given information about Chronic Care Management services, agreed to services, and gave verbal consent prior to initiation of services.  Please see initial visit note for detailed documentation.   Patient agreed to services and consent obtained.   Assessment: Review of patient past medical history, allergies, medications, and health status, including review of relevant consultants reports was performed today as part of a comprehensive evaluation and provision of chronic care management and care coordination services.     SDOH (Social Determinants of Health) assessments and interventions performed:    Advanced Directives Status: Not addressed in this encounter.  CCM Care Plan  Allergies  Allergen Reactions   Codeine Other (See Comments)    Gets "loopy"    Outpatient Encounter Medications as of 07/21/2021  Medication Sig   citalopram (CELEXA) 40 MG tablet Take 1 tablet (40 mg total) by mouth daily.   hydrochlorothiazide (HYDRODIURIL) 25 MG tablet Take 1 tablet by mouth once daily   lisinopril (ZESTRIL) 20 MG tablet Take 1 tablet by mouth once daily   ondansetron (ZOFRAN ODT) 4 MG disintegrating tablet Take 1 tablet (4 mg total) by mouth every 8 (eight) hours as needed for nausea or vomiting.   sertraline (ZOLOFT) 25 MG tablet Take 1 tablet (25 mg total) by mouth at  bedtime.   tadalafil (CIALIS) 20 MG tablet Take 1 tablet (20 mg total) by mouth daily as needed for erectile dysfunction.   [DISCONTINUED] amitriptyline (ELAVIL) 25 MG tablet Take 1 tablet (25 mg total) by mouth at bedtime.   [DISCONTINUED] cetirizine (ZYRTEC) 10 MG tablet Take 1 tablet (10 mg total) by mouth daily.   [DISCONTINUED] fluticasone (FLONASE) 50 MCG/ACT nasal spray Place 2 sprays into both nostrils daily.   [DISCONTINUED] ipratropium (ATROVENT) 0.06 % nasal spray Place 2 sprays into both nostrils 4 (four) times daily.   [DISCONTINUED] rosuvastatin (CRESTOR) 40 MG tablet Take 1 tablet (40 mg total) by mouth daily.   No facility-administered encounter medications on file as of 07/21/2021.    Patient Active Problem List   Diagnosis Date Noted   S/P lumbar fusion 11/04/2018   Degeneration of lumbar intervertebral disc 10/27/2017   History of lumbar fusion 10/27/2017   Back pain 10/01/2016   Lumbar radiculitis 07/17/2016   Lumbosacral radiculitis 05/19/2016   Patellofemoral syndrome of both knees 05/19/2016   Sacroiliac joint dysfunction of both sides 02/04/2016   Obesity 06/03/2015   Mild intermittent asthma 06/03/2015   Major depressive disorder, recurrent, in remission (HCC) 06/03/2015   Postlaminectomy syndrome, lumbar region 03/08/2015   Migraine/Cluster HAs 10/14/2012   Chronic low back pain - hx spinal stenosis, decompression surgery in 2010, 2011 10/14/2012   Hyperlipemia 10/14/2012   Hypertension 10/14/2012    Conditions to be addressed/monitored: Anxiety and Depression.  Limited Social Support, Mental Health Concerns, Family and Relationship Dysfunction, Marital Discord, Social Isolation, Limited Access to Caregiver, and [employer]  Knowledge of [employer].  Care Plan : LCSW Plan of Care  Updates made by Karolee Stamps, LCSW since 07/21/2021 12:00 AM     Problem: Reduce and Manage My Symptoms of Anxiety and Depression.   Priority: High     Goal: Reduce  and Manage My Symptoms of Anxiety and Depression.   Start Date: 07/17/2021  Expected End Date: 10/17/2021  This Visit's Progress: On track  Recent Progress: On track  Priority: High  Note:   Current Barriers:   Acute Mental Health needs related to Caregiver Stress, Marital Discord, Chronic Low Back Pain, Recurrent Major Depressive Disorder, Obesity and Anxiety, requires Support, Education, Resources, Referrals and Care Coordination in order to meet unmet mental health needs. Clinical Goal(s):  Patient will work with LCSW to reduce and manage symptoms of Anxiety and Depression, until well-controlled.     Patient will increase knowledge and/or ability of:        Coping Skills, Healthy Habits, Self-Management Skills, Stress Reduction, Home Safety and Utilizing [employer] and Resources.   Clinical Interventions:  Solution-Focused Therapy performed, Verbalization of Feelings encouraged, Emotional Support provided, Problem Solving Solutions developed, Brief Cognitive Behavioral Therapy initiated, Quality of Sleep assessed and Sleep Hygiene Techniques promoted, Support Group Participation encouraged.     Collaboration with Primary Care Physician, Dr. Chaya Jan regarding development and update of comprehensive plan of care as evidenced by provider attestation and co-signature. Inter-disciplinary care team collaboration (see longitudinal plan of care). E-mailed list of Respite Care Agencies, In-Home Care Agencies and Home Health Care Agencies to patient for review and consideration. E-mailed PACE (Program of All-Inclusive Care for the Elderly) Brochure, PCS (Personal Care Services) Instructions, Application and Agency Provider List to patient for review and consideration. Patient Goals/Self-Care Activities: Begin personal counseling with LCSW, on a weekly/bi-weekly basis, to reduce and manage symptoms of Anxiety and Depression, until well-controlled.   Continue to incorporate into  daily practice - relaxation techniques, deep breathing exercises and mindfulness meditation strategies. Remain on waiting list for [facility] [phone]), to obtain in-home care assistance for autistic son.   Keep son enrolled and involved with the [employer] [facility]. Review resource information and applications e-mailed to you.   LCSW collaboration with representative from [employer] [phone]), to try and obtain additional hours of in-home care services and assistance for son. LCSW collaboration with Carrington Clamp, Case Coordinator with [facility] [phone]), to coordinate continuum of care services for son. Contact LCSW directly (# [id number]) if you have questions, need assistance, or if additional social work needs are identified between now and our next scheduled telephone outreach call.  Follow-Up:  07/28/2021 at 3:00pm      Danford Bad LCSW Licensed Clinical Social Worker [facility] [phone]

## 2021-07-21 NOTE — Patient Instructions (Signed)
Visit Information  Patient verbalizes understanding of instructions provided today and agrees to view in MyChart.   Telephone follow up appointment with care management team member scheduled for:  07/28/2021 at 3:00pm  Danford Bad LCSW Licensed Clinical Social Worker [facility] [phone]

## 2021-07-24 ENCOUNTER — Telehealth: Payer: Self-pay

## 2021-07-24 NOTE — Telephone Encounter (Signed)
Last OV noted on 01/26/20 for weight loss.  Pt notified of above. Appt scheduled for 07/25/21.

## 2021-07-25 ENCOUNTER — Encounter: Payer: Medicare HMO | Admitting: Internal Medicine

## 2021-07-28 ENCOUNTER — Ambulatory Visit: Payer: Medicare HMO | Admitting: *Deleted

## 2021-07-28 DIAGNOSIS — M545 Low back pain, unspecified: Secondary | ICD-10-CM

## 2021-07-28 DIAGNOSIS — I10 Essential (primary) hypertension: Secondary | ICD-10-CM

## 2021-07-28 DIAGNOSIS — G8929 Other chronic pain: Secondary | ICD-10-CM

## 2021-07-28 DIAGNOSIS — M5136 Other intervertebral disc degeneration, lumbar region: Secondary | ICD-10-CM

## 2021-07-28 DIAGNOSIS — M5417 Radiculopathy, lumbosacral region: Secondary | ICD-10-CM

## 2021-07-28 DIAGNOSIS — M533 Sacrococcygeal disorders, not elsewhere classified: Secondary | ICD-10-CM

## 2021-07-28 DIAGNOSIS — F3340 Major depressive disorder, recurrent, in remission, unspecified: Secondary | ICD-10-CM

## 2021-07-28 NOTE — Chronic Care Management (AMB) (Addendum)
Chronic Care Management    Clinical Social Work Note  07/28/2021 Name: Gary Clark MRN: [id number] DOB: 1971/03/17  Gary Clark is a 50 y.o. year old male who is a primary care patient of Philip Aspen, Limmie Patricia, MD. The CCM team was consulted to assist the patient with chronic disease management and/or care coordination needs related to: [employer], Level of Care Concerns, Mental Health Counseling and Resources, and Caregiver Stress.   Engaged with patient by telephone for follow up visit in response to provider referral for social work chronic care management and care coordination services.   Consent to Services:  The patient was given information about Chronic Care Management services, agreed to services, and gave verbal consent prior to initiation of services.  Please see initial visit note for detailed documentation.   Patient agreed to services and consent obtained.   Assessment: Review of patient past medical history, allergies, medications, and health status, including review of relevant consultants reports was performed today as part of a comprehensive evaluation and provision of chronic care management and care coordination services.     SDOH (Social Determinants of Health) assessments and interventions performed:    Advanced Directives Status: Not addressed in this encounter.  CCM Care Plan  Allergies  Allergen Reactions   Codeine Other (See Comments)    Gets "loopy"    Outpatient Encounter Medications as of 07/28/2021  Medication Sig   citalopram (CELEXA) 40 MG tablet Take 1 tablet (40 mg total) by mouth daily.   hydrochlorothiazide (HYDRODIURIL) 25 MG tablet Take 1 tablet by mouth once daily   lisinopril (ZESTRIL) 20 MG tablet Take 1 tablet by mouth once daily   ondansetron (ZOFRAN ODT) 4 MG disintegrating tablet Take 1 tablet (4 mg total) by mouth every 8 (eight) hours as needed for nausea or vomiting.   sertraline (ZOLOFT) 25 MG tablet Take 1 tablet  (25 mg total) by mouth at bedtime.   tadalafil (CIALIS) 20 MG tablet Take 1 tablet (20 mg total) by mouth daily as needed for erectile dysfunction.   [DISCONTINUED] amitriptyline (ELAVIL) 25 MG tablet Take 1 tablet (25 mg total) by mouth at bedtime.   [DISCONTINUED] cetirizine (ZYRTEC) 10 MG tablet Take 1 tablet (10 mg total) by mouth daily.   [DISCONTINUED] fluticasone (FLONASE) 50 MCG/ACT nasal spray Place 2 sprays into both nostrils daily.   [DISCONTINUED] ipratropium (ATROVENT) 0.06 % nasal spray Place 2 sprays into both nostrils 4 (four) times daily.   [DISCONTINUED] rosuvastatin (CRESTOR) 40 MG tablet Take 1 tablet (40 mg total) by mouth daily.   No facility-administered encounter medications on file as of 07/28/2021.    Patient Active Problem List   Diagnosis Date Noted   S/P lumbar fusion 11/04/2018   Degeneration of lumbar intervertebral disc 10/27/2017   History of lumbar fusion 10/27/2017   Back pain 10/01/2016   Lumbar radiculitis 07/17/2016   Lumbosacral radiculitis 05/19/2016   Patellofemoral syndrome of both knees 05/19/2016   Sacroiliac joint dysfunction of both sides 02/04/2016   Obesity 06/03/2015   Mild intermittent asthma 06/03/2015   Major depressive disorder, recurrent, in remission (HCC) 06/03/2015   Postlaminectomy syndrome, lumbar region 03/08/2015   Migraine/Cluster HAs 10/14/2012   Chronic low back pain - hx spinal stenosis, decompression surgery in 2010, 2011 10/14/2012   Hyperlipemia 10/14/2012   Hypertension 10/14/2012    Conditions to be addressed/monitored: Anxiety, Depression and Caregiver Stress.  Limited Social Support, Mental Health Concerns, Family and Relationship Dysfunction, Marital Discord, Social Isolation, Limited  Access to Caregiver, and [employer] of [employer].  Care Plan : LCSW Plan of Care  Updates made by Karolee Stamps, LCSW since 07/28/2021 12:00 AM     Problem: Reduce and Manage My Symptoms of Anxiety and  Depression.   Priority: High     Goal: Reduce and Manage My Symptoms of Anxiety and Depression.   Start Date: 07/17/2021  Expected End Date: 10/17/2021  This Visit's Progress: On track  Recent Progress: On track  Priority: High  Note:   Current Barriers:   Acute Mental Health needs related to Caregiver Stress, Marital Discord, Chronic Low Back Pain, Recurrent Major Depressive Disorder, Obesity and Anxiety, requires Support, Education, Resources, Referrals and Care Coordination in order to meet unmet mental health needs. Clinical Goal(s):  Patient will work with LCSW to reduce and manage symptoms of Anxiety and Depression, until well-controlled.     Patient will increase knowledge and/or ability of:        Coping Skills, Healthy Habits, Self-Management Skills, Stress Reduction, Home Safety and Utilizing [employer] and Resources.   Clinical Interventions:  Solution-Focused Therapy performed, Verbalization of Feelings encouraged, Caregiver Stress acknowledged, Emotional Support provided, Problem Solving Solutions developed and Cognitive Behavioral Therapy initiated.    Collaboration with Primary Care Physician, Dr. Chaya Jan regarding development and update of comprehensive plan of care as evidenced by provider attestation and co-signature. Inter-disciplinary care team collaboration (see longitudinal plan of care). Patient Goals/Self-Care Activities: Continue to receive personal counseling with LCSW, on a weekly/bi-weekly basis, to reduce and manage symptoms of Anxiety and Depression, until well-controlled.  Confirmed e-mail receipt of resource information and applications, and review of Respite Care Agencies and Personal Care Services, through [employer]. LCSW collaboration with Lona Kettle, [facility] Integration Program Coordinator with [employer] [phone]; # [phone]; [email or web address]), to check waiting list  status for disabled son.    HIPAA compliant messages left on voicemail and via e-mail.  Awaiting return call. LCSW collaboration with Carrington Clamp, Qualified Professional with [facility] [phone]; [phone]; # [phone]), to inquire about additional hours of in-home care services for disabled son.    HIPAA compliant messages left on voicemail.  Awaiting return call. LCSW collaboration with Mariann Laster, Case Coordinator with [facility] [phone]), to inquire about additional hours of respite care services for you.  Eligible for 8 hours of respite care services per week, for a total of 384 hours per year.   Eligible for 8 hours of in-home care services per week, for a total of 384 hours per year. Contact LCSW directly (# [id number]) if you have questions, need assistance, or if additional social work needs are identified between now and our next scheduled telephone outreach call.  Follow-Up:  08/11/2021 at 12:15pm      Danford Bad LCSW Licensed Clinical Social Worker [facility] [phone]

## 2021-07-28 NOTE — Patient Instructions (Addendum)
Visit Information  Current Barriers:   Acute Mental Health needs related to Caregiver Stress, Marital Discord, Chronic Low Back Pain, Recurrent Major Depressive Disorder, Obesity and Anxiety, requires Support, Education, Resources, Referrals and Care Coordination in order to meet unmet mental health needs. Clinical Goal(s):  Patient will work with LCSW to reduce and manage symptoms of Anxiety and Depression, until well-controlled.     Patient will increase knowledge and/or ability of:        Coping Skills, Healthy Habits, Self-Management Skills, Stress Reduction, Home Safety and Utilizing [employer] and Resources.   Clinical Interventions:  Solution-Focused Therapy performed, Verbalization of Feelings encouraged, Caregiver Stress acknowledged, Emotional Support provided, Problem Solving Solutions developed and Cognitive Behavioral Therapy initiated.    Collaboration with Primary Care Physician, Dr. Chaya Jan regarding development and update of comprehensive plan of care as evidenced by provider attestation and co-signature. Inter-disciplinary care team collaboration (see longitudinal plan of care). Patient Goals/Self-Care Activities: Continue to receive personal counseling with LCSW, on a weekly/bi-weekly basis, to reduce and manage symptoms of Anxiety and Depression, until well-controlled.  Confirmed e-mail receipt of resource information and applications, and review of Respite Care Agencies and Personal Care Services, through [employer]. LCSW collaboration with Lona Kettle, [facility] Integration Program Coordinator with [employer] [phone]; # [phone]; [email or web address]), to check waiting list status for disabled son.    HIPAA compliant messages left on voicemail and via e-mail.  Awaiting return call. LCSW collaboration with Carrington Clamp, Qualified Professional with [facility] [phone]; [phone]; #  [phone]), to inquire about additional hours of in-home care services for disabled son.    HIPAA compliant messages left on voicemail.  Awaiting return call. LCSW collaboration with Mariann Laster, Case Coordinator with [facility] [phone]), to inquire about additional hours of respite care services for you.  Eligible for 8 hours of respite care services per week, for a total of 384 hours per year.   Eligible for 8 hours of in-home care services per week, for a total of 384 hours per year. Contact LCSW directly (# [id number]) if you have questions, need assistance, or if additional social work needs are identified between now and our next scheduled telephone outreach call.  Follow-Up:  08/11/2021 at 12:15pm     Patient verbalizes understanding of instructions provided today and agrees to view in MyChart.   Telephone follow up appointment with care management team member scheduled for:  08/11/2021 at 12:15pm  Danford Bad LCSW Licensed Clinical Social Worker [facility] [phone]

## 2021-08-05 ENCOUNTER — Telehealth: Payer: Self-pay

## 2021-08-05 ENCOUNTER — Telehealth: Payer: Medicare HMO

## 2021-08-05 NOTE — Telephone Encounter (Signed)
  Care Management   Follow Up Note   08/05/2021 Name: Gary Clark MRN: [id number] DOB: June 27, 1971   Referred by: Philip Aspen, Limmie Patricia, MD Reason for referral : Chronic Care Management (RNCM: Follow up Outreach Chronic Care Management and coordination needs-unsuccessful)   An unsuccessful telephone outreach was attempted today. The patient was referred to the case management team for assistance with care management and care coordination.   Follow Up Plan: The care management team will reach out to the patient again over the next 30 days.  Dudley Major RN, Maximiano Coss, CDE Care Management Coordinator [facility] [phone], Mobile [phone]

## 2021-08-11 ENCOUNTER — Ambulatory Visit: Payer: Medicare HMO | Admitting: *Deleted

## 2021-08-11 DIAGNOSIS — M5136 Other intervertebral disc degeneration, lumbar region: Secondary | ICD-10-CM

## 2021-08-11 DIAGNOSIS — M222X1 Patellofemoral disorders, right knee: Secondary | ICD-10-CM

## 2021-08-11 DIAGNOSIS — M5417 Radiculopathy, lumbosacral region: Secondary | ICD-10-CM

## 2021-08-11 DIAGNOSIS — G43909 Migraine, unspecified, not intractable, without status migrainosus: Secondary | ICD-10-CM

## 2021-08-11 DIAGNOSIS — M533 Sacrococcygeal disorders, not elsewhere classified: Secondary | ICD-10-CM

## 2021-08-11 DIAGNOSIS — Z981 Arthrodesis status: Secondary | ICD-10-CM

## 2021-08-11 DIAGNOSIS — I10 Essential (primary) hypertension: Secondary | ICD-10-CM

## 2021-08-11 DIAGNOSIS — F3340 Major depressive disorder, recurrent, in remission, unspecified: Secondary | ICD-10-CM

## 2021-08-11 DIAGNOSIS — M545 Low back pain, unspecified: Secondary | ICD-10-CM

## 2021-08-11 DIAGNOSIS — E785 Hyperlipidemia, unspecified: Secondary | ICD-10-CM

## 2021-08-11 DIAGNOSIS — M5416 Radiculopathy, lumbar region: Secondary | ICD-10-CM

## 2021-08-11 NOTE — Chronic Care Management (AMB) (Signed)
Chronic Care Management    Clinical Social Work Note  08/11/2021 Name: Gary Clark MRN: [id number] DOB: [birth year]  Gary Clark is a 50 y.o. year old male who is a primary care patient of Philip Aspen, Limmie Patricia, MD. The CCM team was consulted to assist the patient with chronic disease management and/or care coordination needs related to: [employer], Mental Health Counseling and Resources, Caregiver Stress, and Financial Difficulties.   Engaged with patient by telephone for follow up visit in response to provider referral for social work chronic care management and care coordination services.   Consent to Services:  The patient was given information about Chronic Care Management services, agreed to services, and gave verbal consent prior to initiation of services.  Please see initial visit note for detailed documentation.   Patient agreed to services and consent obtained.   Assessment: Review of patient past medical history, allergies, medications, and health status, including review of relevant consultants reports was performed today as part of a comprehensive evaluation and provision of chronic care management and care coordination services.     SDOH (Social Determinants of Health) assessments and interventions performed:    Advanced Directives Status: Not addressed in this encounter.  CCM Care Plan  Allergies  Allergen Reactions   Codeine Other (See Comments)    Gets "loopy"    Outpatient Encounter Medications as of 08/11/2021  Medication Sig   citalopram (CELEXA) 40 MG tablet Take 1 tablet (40 mg total) by mouth daily.   hydrochlorothiazide (HYDRODIURIL) 25 MG tablet Take 1 tablet by mouth once daily   lisinopril (ZESTRIL) 20 MG tablet Take 1 tablet by mouth once daily   ondansetron (ZOFRAN ODT) 4 MG disintegrating tablet Take 1 tablet (4 mg total) by mouth every 8 (eight) hours as needed for nausea or vomiting.   sertraline (ZOLOFT) 25 MG tablet Take 1 tablet  (25 mg total) by mouth at bedtime.   tadalafil (CIALIS) 20 MG tablet Take 1 tablet (20 mg total) by mouth daily as needed for erectile dysfunction.   [DISCONTINUED] amitriptyline (ELAVIL) 25 MG tablet Take 1 tablet (25 mg total) by mouth at bedtime.   [DISCONTINUED] cetirizine (ZYRTEC) 10 MG tablet Take 1 tablet (10 mg total) by mouth daily.   [DISCONTINUED] fluticasone (FLONASE) 50 MCG/ACT nasal spray Place 2 sprays into both nostrils daily.   [DISCONTINUED] ipratropium (ATROVENT) 0.06 % nasal spray Place 2 sprays into both nostrils 4 (four) times daily.   [DISCONTINUED] rosuvastatin (CRESTOR) 40 MG tablet Take 1 tablet (40 mg total) by mouth daily.   No facility-administered encounter medications on file as of 08/11/2021.    Patient Active Problem List   Diagnosis Date Noted   S/P lumbar fusion 11/04/2018   Degeneration of lumbar intervertebral disc 10/27/2017   History of lumbar fusion 10/27/2017   Back pain 10/01/2016   Lumbar radiculitis 07/17/2016   Lumbosacral radiculitis 05/19/2016   Patellofemoral syndrome of both knees 05/19/2016   Sacroiliac joint dysfunction of both sides 02/04/2016   Obesity 06/03/2015   Mild intermittent asthma 06/03/2015   Major depressive disorder, recurrent, in remission (HCC) 06/03/2015   Postlaminectomy syndrome, lumbar region 03/08/2015   Migraine/Cluster HAs 10/14/2012   Chronic low back pain - hx spinal stenosis, decompression surgery in 2010, 2011 10/14/2012   Hyperlipemia 10/14/2012   Hypertension 10/14/2012    Conditions to be addressed/monitored: Anxiety and Depression.  Corporate treasurer, Limited Social Support, Mental Health Concerns, Family and Relationship Dysfunction, Limited Access to Caregiver, and Lacks Knowledge  of [employer].  Care Plan : LCSW Plan of Care  Updates made by Karolee Stamps, LCSW since 08/11/2021 12:00 AM     Problem: Reduce and Manage My Symptoms of Anxiety and Depression.   Priority: High      Goal: Reduce and Manage My Symptoms of Anxiety and Depression.   Start Date: 07/17/2021  Expected End Date: 10/17/2021  This Visit's Progress: On track  Recent Progress: On track  Priority: High  Note:   Current Barriers:   Acute Mental Health needs related to Caregiver Stress, Marital Discord, Chronic Low Back Pain, Recurrent Major Depressive Disorder, Obesity and Anxiety, requires Support, Education, Resources, Referrals and Care Coordination in order to meet unmet mental health needs. Clinical Goal(s):  Patient will work with LCSW to reduce and manage symptoms of Anxiety and Depression, until well-controlled.     Patient will increase knowledge and/or ability of:  Coping Skills, Healthy Habits, Self-Management Skills, Stress Reduction, Home Safety and Utilizing [employer] and Resources.   Clinical Interventions:  Client-Centered Therapy performed, Verbalization of Feelings encouraged, Caregiver Stress acknowledged, Emotional Support provided, Problem-Solving Solutions developed and Active Listening/Reflection utilized.    Collaboration with Primary Care Physician, Dr. Chaya Jan regarding development and update of comprehensive plan of care as evidenced by provider attestation and co-signature. Inter-disciplinary care team collaboration (see longitudinal plan of care). Patient Goals/Self-Care Activities: Continue to receive personal counseling with LCSW, on a bi-weekly basis, to reduce and manage symptoms of Anxiety and Depression, until well-controlled.  LCSW's continued attempts to collaborate with Gary Clark, [facility] Integration Program Coordinator with [employer] [phone]; # [phone]; [email or web address]), to check waiting list status for disabled son.    HIPAA compliant messages left on voicemail and via e-mail.  Awaiting return call or e-mail response. LCSW's continued attempts to collaborate with Carrington Clamp,  Qualified Professional with [employer] [phone]; [phone]; # [phone]), to inquire about additional hours of in-home care services for disabled son.    HIPAA compliant messages left on voicemail.  Awaiting return call. LCSW collaboration with representative from the [facility] [phone]), to inquire about process for assigning Guardian ad Litem services for you and your disabled son. Converse with [facility], during your scheduled divorce hearing today (08/11/2021 at 2:30 pm), about joint versus legal custody of your disabled son, and whether or not you would be eligible to receive child support, if your ex-wife refuses joint custody.   Contact LCSW directly (# [id number]) if you have questions, need assistance, or if additional social work needs are identified between now and our next scheduled telephone outreach call.  Follow-Up:  08/25/2021 at 11:15 am      Danford Bad LCSW Licensed Clinical Social Worker [facility] [phone]

## 2021-08-11 NOTE — Patient Instructions (Signed)
Visit Information  Thank you for taking time to visit with me today. Please don't hesitate to contact me if I can be of assistance to you before our next scheduled telephone appointment.  Following are the goals we discussed today:   Patient Goals/Self-Care Activities: Continue to receive personal counseling with LCSW, on a bi-weekly basis, to reduce and manage symptoms of Anxiety and Depression, until well-controlled.  LCSW's continued attempts to collaborate with Lona Kettle, [facility] Integration Program Coordinator with [employer] [phone]; # [phone]; [email or web address]), to check waiting list status for disabled son.    HIPAA compliant messages left on voicemail and via e-mail.  Awaiting return call or e-mail response. LCSW's continued attempts to collaborate with Carrington Clamp, Qualified Professional with [employer] [phone]; [phone]; # [phone]), to inquire about additional hours of in-home care services for disabled son.    HIPAA compliant messages left on voicemail.  Awaiting return call. LCSW collaboration with representative from the [facility] [phone]), to inquire about process for assigning Guardian ad Litem services for you and your disabled son. Converse with [facility], during your scheduled divorce hearing today (08/11/2021 at 2:30 pm), about joint versus legal custody of your disabled son, and whether or not you would be eligible to receive child support, if your ex-wife refuses joint custody.   Contact LCSW directly (# [id number]) if you have questions, need assistance, or if additional social work needs are identified between now and our next scheduled telephone outreach call.  Our next appointment is by telephone on 08/25/2021 at 11:15 am.  Please call the care guide team at [phone] if you need to cancel or reschedule your appointment.   If you are experiencing a  Mental Health or Behavioral Health Crisis or need someone to talk to, please call the Suicide and Crisis Lifeline: 988 call the [place] National Suicide Prevention Lifeline: (660)216-3150 or TTY: 817-786-0751 TTY 608-164-4303) to talk to a trained counselor call 1-800-273-TALK (toll free, 24 hour hotline) go to [facility] [address] [place] [phone]) call 911   Patient verbalizes understanding of instructions provided today and agrees to view in MyChart.   Danford Bad LCSW Licensed Clinical Social Worker [facility] [phone]

## 2021-08-13 DIAGNOSIS — F3340 Major depressive disorder, recurrent, in remission, unspecified: Secondary | ICD-10-CM

## 2021-08-13 DIAGNOSIS — E785 Hyperlipidemia, unspecified: Secondary | ICD-10-CM

## 2021-08-13 DIAGNOSIS — I10 Essential (primary) hypertension: Secondary | ICD-10-CM

## 2021-08-14 NOTE — Telephone Encounter (Signed)
Rescheduled 08/18/21

## 2021-08-18 ENCOUNTER — Ambulatory Visit (INDEPENDENT_AMBULATORY_CARE_PROVIDER_SITE_OTHER): Payer: Medicare HMO

## 2021-08-18 DIAGNOSIS — G8929 Other chronic pain: Secondary | ICD-10-CM

## 2021-08-18 DIAGNOSIS — I10 Essential (primary) hypertension: Secondary | ICD-10-CM

## 2021-08-18 DIAGNOSIS — E785 Hyperlipidemia, unspecified: Secondary | ICD-10-CM

## 2021-08-18 NOTE — Patient Instructions (Signed)
Visit Information  Thank you for taking time to visit with me today. Please don't hesitate to contact me if I can be of assistance to you before our next scheduled telephone appointment.  Following are the goals we discussed today:  Take all medications as prescribed Attend all scheduled provider appointments Call pharmacy for medication refills 3-7 days in advance of running out of medications Perform all self care activities independently  Call provider office for new concerns or questions  Work with the social worker to address care coordination needs and will continue to work with the clinical team to address health care and disease management related needs check blood pressure weekly choose a place to take my blood pressure (home, clinic or office, retail store) take blood pressure log to all doctor appointments call doctor for signs and symptoms of high blood pressure eat more whole grains, fruits and vegetables, lean meats and healthy fats call for medicine refill 2 or 3 days before it runs out take all medications exactly as prescribed call doctor with any symptoms you believe are related to your medicine  Our next appointment is by telephone on 10/20/21 at 215 PM  Please call the care guide team at [phone] if you need to cancel or reschedule your appointment.   If you are experiencing a Mental Health or Behavioral Health Crisis or need someone to talk to, please call the Suicide and Crisis Lifeline: 988 call the [place] National Suicide Prevention Lifeline: 905-654-3914 or TTY: 215-786-7545 TTY 203-734-9303) to talk to a trained counselor call 1-800-273-TALK (toll free, 24 hour hotline) go to [facility] [address], [place] [phone]) call 911   Patient verbalizes understanding of instructions provided today and agrees to view in MyChart.  Dudley Major RN, Maximiano Coss, CDE Care Management Coordinator [facility] [phone], Mobile [phone]

## 2021-08-18 NOTE — Chronic Care Management (AMB) (Signed)
Chronic Care Management   CCM RN Visit Note  08/18/2021 Name: Gary Clark MRN: [id number] DOB: 02-14-1971  Subjective: Gary Clark is a 50 y.o. year old male who is a primary care patient of Isaac Bliss, Rayford Halsted, MD. The care management team was consulted for assistance with disease management and care coordination needs.    Engaged with patient by telephone for follow up visit in response to provider referral for case management and/or care coordination services.   Consent to Services:  The patient was given information about Chronic Care Management services, agreed to services, and gave verbal consent prior to initiation of services.  Please see initial visit note for detailed documentation.   Patient agreed to services and verbal consent obtained.   Assessment: Review of patient past medical history, allergies, medications, health status, including review of consultants reports, laboratory and other test data, was performed as part of comprehensive evaluation and provision of chronic care management services.   SDOH (Social Determinants of Health) assessments and interventions performed:    CCM Care Plan  Allergies  Allergen Reactions   Codeine Other (See Comments)    Gets "loopy"    Outpatient Encounter Medications as of 08/18/2021  Medication Sig   citalopram (CELEXA) 40 MG tablet Take 1 tablet (40 mg total) by mouth daily.   hydrochlorothiazide (HYDRODIURIL) 25 MG tablet Take 1 tablet by mouth once daily   lisinopril (ZESTRIL) 20 MG tablet Take 1 tablet by mouth once daily   ondansetron (ZOFRAN ODT) 4 MG disintegrating tablet Take 1 tablet (4 mg total) by mouth every 8 (eight) hours as needed for nausea or vomiting.   sertraline (ZOLOFT) 25 MG tablet Take 1 tablet (25 mg total) by mouth at bedtime.   tadalafil (CIALIS) 20 MG tablet Take 1 tablet (20 mg total) by mouth daily as needed for erectile dysfunction.   [DISCONTINUED] amitriptyline (ELAVIL) 25 MG tablet Take  1 tablet (25 mg total) by mouth at bedtime.   [DISCONTINUED] cetirizine (ZYRTEC) 10 MG tablet Take 1 tablet (10 mg total) by mouth daily.   [DISCONTINUED] fluticasone (FLONASE) 50 MCG/ACT nasal spray Place 2 sprays into both nostrils daily.   [DISCONTINUED] ipratropium (ATROVENT) 0.06 % nasal spray Place 2 sprays into both nostrils 4 (four) times daily.   [DISCONTINUED] rosuvastatin (CRESTOR) 40 MG tablet Take 1 tablet (40 mg total) by mouth daily.   No facility-administered encounter medications on file as of 08/18/2021.    Patient Active Problem List   Diagnosis Date Noted   S/P lumbar fusion 11/04/2018   Degeneration of lumbar intervertebral disc 10/27/2017   History of lumbar fusion 10/27/2017   Back pain 10/01/2016   Lumbar radiculitis 07/17/2016   Lumbosacral radiculitis 05/19/2016   Patellofemoral syndrome of both knees 05/19/2016   Sacroiliac joint dysfunction of both sides 02/04/2016   Obesity 06/03/2015   Mild intermittent asthma 06/03/2015   Major depressive disorder, recurrent, in remission ([place]) 06/03/2015   Postlaminectomy syndrome, lumbar region 03/08/2015   Migraine/Cluster HAs 10/14/2012   Chronic low back pain - hx spinal stenosis, decompression surgery in 2010, 2011 10/14/2012   Hyperlipemia 10/14/2012   Hypertension 10/14/2012    Conditions to be addressed/monitored:HTN, HLD, Depression, and chronic back pain  Care Plan : Chronic Disease Management and Care Coordination Needs(HTN, HLD, depression)  Updates made by Dimitri Ped, RN since 08/18/2021 12:00 AM     Problem: Chronic Disease Management and Care Coordination Needs (HTN, HLD, depression)   Priority: High  Long-Range Goal: Chronic Disease Management and Care Coordination Needs (HTN, HLD, depression)   Start Date: 07/07/2021  Expected End Date: 01/03/2022  Recent Progress: On track  Priority: High  Note:   Current Barriers:  Care Coordination needs related to Mental Health Concerns  and  caregiver stress Chronic Disease Management support and education needs related to HTN, HLD, and Depression chronic pain Pt states that he has a lot of stress, anxiety and some depression as he has to care for his 46 yr old son with autism and his elderly parents are now living with him.  He is in the process of getting divorced.   States he has had his Mother check his B/P and it has been ranging around 130/80.  States he is still eating at night from stress.  States he is working with the Education officer, museum to help him with his caregiver stress.  States his back pain is unchanged   RNCM Clinical Goal(s):  Patient will verbalize understanding of plan for management of HTN, HLD, Depression, and chronic pain as evidenced by voiced adherence to plan of care verbalize basic understanding of  HTN, HLD, Depression, and chronic pain disease process and self health management plan as evidenced by voiced understanding take all medications exactly as prescribed and will call provider for medication related questions as evidenced by dispense report and pt verbalization continue to work with RN Care Manager to address care management and care coordination needs related to  HTN, HLD, Depression, and chronic pain as evidenced by adherence to CM Team Scheduled appointments work with Education officer, museum to address  related to the management of Mental Health Concerns  and caregiver stress related to the management of HTN, HLD, Depression, and chronic pain as evidenced by review of EMR and patient or Education officer, museum report through collaboration with Consulting civil engineer, provider, and care team.   Interventions: 1:1 collaboration with primary care provider regarding development and update of comprehensive plan of care as evidenced by provider attestation and co-signature Inter-disciplinary care team collaboration (see longitudinal plan of care) Evaluation of current treatment plan related to  self management and patient's adherence to  plan as established by provider     Pain Interventions:(Status: Goal on track:  Yes. Long Term Goal Pain assessment performed Medications reviewed Reviewed provider established plan for pain management Counseled on the importance of reporting any/all new or changed pain symptoms or management strategies to pain management provider Advised patient to report to care team affect of pain on daily activities Discussed use of relaxation techniques and/or diversional activities to assist with pain reduction (distraction, imagery, relaxation, massage, acupressure, TENS, heat, and cold application Reviewed with patient prescribed pharmacological and nonpharmacological pain relief strategies Referred to CCM LCSW for issues with depression and caregiver stress working with LCSW  Hypertension Interventions:(Status:  Goal on track:  Yes.Long Term Goal Last practice recorded BP readings:  BP Readings from Last 3 Encounters:  02/03/21 123/87  08/11/20 (!) 143/97  01/26/20 124/84  Most recent eGFR/CrCl: No results found for: EGFR  No components found for: CRCL  Evaluation of current treatment plan related to hypertension self management and patient's adherence to plan as established by provider Provided education to patient re: stroke prevention, s/s of heart attack and stroke Reviewed medications with patient and discussed importance of compliance Counseled on the importance of exercise goals with target of 150 minutes per week Advised patient, providing education and rationale, to monitor blood pressure daily and record, calling PCP for findings  outside established parameters Provided education on prescribed diet low sodium heart healthy Reviewed to try other activities to help avoid snacking on unhealthy snacks at night Hyperlipidemia Interventions:(Status:  Goal on track:  Yes.Long Term Goal Medication review performed; medication list updated in electronic medical record.  Provider established  cholesterol goals reviewed Counseled on importance of regular laboratory monitoring as prescribed Reviewed importance of limiting foods high in cholesterol Reviewed exercise goals and target of 150 minutes per week  Patient Goals/Self-Care Activities: Take all medications as prescribed Attend all scheduled provider appointments Call pharmacy for medication refills 3-7 days in advance of running out of medications Perform all self care activities independently  Call provider office for new concerns or questions  Work with the social worker to address care coordination needs and will continue to work with the clinical team to address health care and disease management related needs check blood pressure weekly choose a place to take my blood pressure (home, clinic or office, retail store) take blood pressure log to all doctor appointments call doctor for signs and symptoms of high blood pressure eat more whole grains, fruits and vegetables, lean meats and healthy fats call for medicine refill 2 or 3 days before it runs out take all medications exactly as prescribed call doctor with any symptoms you believe are related to your medicine  Follow Up Plan:  Telephone follow up appointment with care management team member scheduled for:  10/20/21 The patient has been provided with contact information for the care management team and has been advised to call with any health related questions or concerns.       Plan:Telephone follow up appointment with care management team member scheduled for:  10/20/21 The patient has been provided with contact information for the care management team and has been advised to call with any health related questions or concerns.  Peter Garter RN, Jackquline Denmark, CDE Care Management Coordinator [facility] [phone], Mobile [phone]

## 2021-08-21 ENCOUNTER — Other Ambulatory Visit: Payer: Self-pay | Admitting: Internal Medicine

## 2021-08-21 DIAGNOSIS — F3340 Major depressive disorder, recurrent, in remission, unspecified: Secondary | ICD-10-CM

## 2021-08-25 ENCOUNTER — Ambulatory Visit: Payer: Medicare HMO | Admitting: *Deleted

## 2021-08-25 DIAGNOSIS — G43909 Migraine, unspecified, not intractable, without status migrainosus: Secondary | ICD-10-CM

## 2021-08-25 DIAGNOSIS — F3340 Major depressive disorder, recurrent, in remission, unspecified: Secondary | ICD-10-CM

## 2021-08-25 DIAGNOSIS — M5136 Other intervertebral disc degeneration, lumbar region: Secondary | ICD-10-CM

## 2021-08-25 DIAGNOSIS — M545 Low back pain, unspecified: Secondary | ICD-10-CM

## 2021-08-25 DIAGNOSIS — I10 Essential (primary) hypertension: Secondary | ICD-10-CM

## 2021-08-25 DIAGNOSIS — M533 Sacrococcygeal disorders, not elsewhere classified: Secondary | ICD-10-CM

## 2021-08-25 DIAGNOSIS — J4520 Mild intermittent asthma, uncomplicated: Secondary | ICD-10-CM

## 2021-08-25 DIAGNOSIS — E785 Hyperlipidemia, unspecified: Secondary | ICD-10-CM

## 2021-08-25 NOTE — Chronic Care Management (AMB) (Signed)
Chronic Care Management    Clinical Social Work Note  08/25/2021 Name: Gary Clark MRN: [id number] DOB: [birth year]  Gary Clark is a 50 y.o. year old male who is a primary care patient of Philip Aspen, Limmie Patricia, MD. The CCM team was consulted to assist the patient with chronic disease management and/or care coordination needs related to: [employer] and Caregiver Stress.   Engaged with patient by telephone for follow up visit in response to provider referral for social work chronic care management and care coordination services.   Consent to Services:  The patient was given information about Chronic Care Management services, agreed to services, and gave verbal consent prior to initiation of services.  Please see initial visit note for detailed documentation.   Patient agreed to services and consent obtained.   Assessment: Review of patient past medical history, allergies, medications, and health status, including review of relevant consultants reports was performed today as part of a comprehensive evaluation and provision of chronic care management and care coordination services.     SDOH (Social Determinants of Health) assessments and interventions performed:    Advanced Directives Status: Not addressed in this encounter.  CCM Care Plan  Allergies  Allergen Reactions   Codeine Other (See Comments)    Gets "loopy"    Outpatient Encounter Medications as of 08/25/2021  Medication Sig   citalopram (CELEXA) 40 MG tablet Take 1 tablet by mouth once daily   hydrochlorothiazide (HYDRODIURIL) 25 MG tablet Take 1 tablet by mouth once daily   lisinopril (ZESTRIL) 20 MG tablet Take 1 tablet by mouth once daily   ondansetron (ZOFRAN ODT) 4 MG disintegrating tablet Take 1 tablet (4 mg total) by mouth every 8 (eight) hours as needed for nausea or vomiting.   sertraline (ZOLOFT) 25 MG tablet Take 1 tablet (25 mg total) by mouth at bedtime.   tadalafil (CIALIS) 20 MG tablet Take  1 tablet (20 mg total) by mouth daily as needed for erectile dysfunction.   [DISCONTINUED] amitriptyline (ELAVIL) 25 MG tablet Take 1 tablet (25 mg total) by mouth at bedtime.   [DISCONTINUED] cetirizine (ZYRTEC) 10 MG tablet Take 1 tablet (10 mg total) by mouth daily.   [DISCONTINUED] fluticasone (FLONASE) 50 MCG/ACT nasal spray Place 2 sprays into both nostrils daily.   [DISCONTINUED] ipratropium (ATROVENT) 0.06 % nasal spray Place 2 sprays into both nostrils 4 (four) times daily.   [DISCONTINUED] rosuvastatin (CRESTOR) 40 MG tablet Take 1 tablet (40 mg total) by mouth daily.   No facility-administered encounter medications on file as of 08/25/2021.    Patient Active Problem List   Diagnosis Date Noted   S/P lumbar fusion 11/04/2018   Degeneration of lumbar intervertebral disc 10/27/2017   History of lumbar fusion 10/27/2017   Back pain 10/01/2016   Lumbar radiculitis 07/17/2016   Lumbosacral radiculitis 05/19/2016   Patellofemoral syndrome of both knees 05/19/2016   Sacroiliac joint dysfunction of both sides 02/04/2016   Obesity 06/03/2015   Mild intermittent asthma 06/03/2015   Major depressive disorder, recurrent, in remission (HCC) 06/03/2015   Postlaminectomy syndrome, lumbar region 03/08/2015   Migraine/Cluster HAs 10/14/2012   Chronic low back pain - hx spinal stenosis, decompression surgery in 2010, 2011 10/14/2012   Hyperlipemia 10/14/2012   Hypertension 10/14/2012    Conditions to be addressed/monitored: Caregiver Stress, Anxiety and Depression.  Corporate treasurer, Limited Social Support, Mental Health Concerns, Social Isolation, Limited Access to Caregiver, and Lacks Knowledge of [employer].  Care Plan : LCSW Plan  of Care  Updates made by Karolee Stamps, LCSW since 08/25/2021 12:00 AM     Problem: Reduce and Manage My Symptoms of Anxiety and Depression.   Priority: High     Goal: Reduce and Manage My Symptoms of Anxiety and Depression.   Start  Date: 07/17/2021  Expected End Date: 10/17/2021  This Visit's Progress: On track  Recent Progress: On track  Priority: High  Note:   Current Barriers:   Acute Mental Health needs related to Caregiver Stress, Marital Discord, Chronic Low Back Pain, Recurrent Major Depressive Disorder, Obesity and Anxiety, requires Support, Education, Resources, Referrals and Care Coordination in order to meet unmet mental health needs. Clinical Goal(s):  Patient will work with LCSW to reduce and manage symptoms of Anxiety and Depression, until well-controlled.     Patient will increase knowledge and/or ability of:  Coping Skills, Healthy Habits, Self-Management Skills, Stress Reduction, Home Safety and Utilizing [employer] and Resources.   Clinical Interventions:  Client-Centered Therapy performed, Verbalization of Feelings encouraged, Caregiver Stress acknowledged, Emotional Support provided, Problem-Solving Solutions developed and Active Listening/Reflection utilized.    Collaboration with Primary Care Physician, Dr. Chaya Jan regarding development and update of comprehensive plan of care as evidenced by provider attestation and co-signature. Inter-disciplinary care team collaboration (see longitudinal plan of care). Patient Goals/Self-Care Activities: Continue to receive personal counseling through LCSW, on a bi-weekly basis, to reduce and manage symptoms of Anxiety and Depression, until well-controlled.  Await response from Lona Kettle, [employer] with [employer] [phone]# [phone]; # [phone]; [email or web address]), as to whether or not your 47 year old disabled son will be approved for additional hours of in-home care services.   LCSW's continued attempts to collaborate with Carrington Clamp, Qualified Professional with [employer] [phone]; [phone]; # [phone]), to inquire about additional hours of in-home care  services for your 27 year old disabled son.    HIPAA compliant messages left on voicemail.  Awaiting return call. Contact LCSW directly (# [id number]) if you have questions, need assistance, or if additional social work needs are identified between now and our next scheduled telephone outreach call.  Follow-Up:  09/03/2021 at 4:00 pm      Danford Bad LCSW Licensed Clinical Social Worker [facility] [phone]

## 2021-08-25 NOTE — Patient Instructions (Signed)
Visit Information  Thank you for taking time to visit with me today. Please don't hesitate to contact me if I can be of assistance to you before our next scheduled telephone appointment.  Following are the goals we discussed today:            Patient Goals/Self-Care Activities: Continue to receive personal counseling through LCSW, on a bi-weekly basis, to reduce and manage symptoms of Anxiety and Depression, until well-controlled.  Await response from Lona Kettle, [employer] with [employer] [phone]# [phone]; # [phone]; [email or web address]), as to whether or not your 1 year old disabled son will be approved for additional hours of in-home care services.   LCSW's continued attempts to collaborate with Carrington Clamp, Qualified Professional with [employer] [phone]; [phone]; # [phone]), to inquire about additional hours of in-home care services for your 51 year old disabled son.    HIPAA compliant messages left on voicemail.  Awaiting return call. Contact LCSW directly (# [id number]) if you have questions, need assistance, or if additional social work needs are identified between now and our next scheduled telephone outreach call.   Our next appointment is by telephone on 09/03/2021 at 4:00 pm.  Please call the care guide team at [phone] if you need to cancel or reschedule your appointment.   If you are experiencing a Mental Health or Behavioral Health Crisis or need someone to talk to, please call the Suicide and Crisis Lifeline: 988 call the [place] National Suicide Prevention Lifeline: (617)427-8403 or TTY: 740-183-1791 TTY 212-724-1315) to talk to a trained counselor call 1-800-273-TALK (toll free, 24 hour hotline) go to [facility] [address] [place] [phone]) call the [facility] Crisis Line: 432-602-6727 call 911   Patient verbalizes  understanding of instructions provided today and agrees to view in MyChart.   Danford Bad LCSW Licensed Clinical Social Worker [facility] [phone]

## 2021-09-03 ENCOUNTER — Ambulatory Visit: Payer: Medicare HMO | Admitting: *Deleted

## 2021-09-03 DIAGNOSIS — G8929 Other chronic pain: Secondary | ICD-10-CM

## 2021-09-03 DIAGNOSIS — M222X1 Patellofemoral disorders, right knee: Secondary | ICD-10-CM

## 2021-09-03 DIAGNOSIS — M533 Sacrococcygeal disorders, not elsewhere classified: Secondary | ICD-10-CM

## 2021-09-03 DIAGNOSIS — F3340 Major depressive disorder, recurrent, in remission, unspecified: Secondary | ICD-10-CM

## 2021-09-03 DIAGNOSIS — G43909 Migraine, unspecified, not intractable, without status migrainosus: Secondary | ICD-10-CM

## 2021-09-03 DIAGNOSIS — I10 Essential (primary) hypertension: Secondary | ICD-10-CM

## 2021-09-03 DIAGNOSIS — E785 Hyperlipidemia, unspecified: Secondary | ICD-10-CM

## 2021-09-03 DIAGNOSIS — M5136 Other intervertebral disc degeneration, lumbar region: Secondary | ICD-10-CM

## 2021-09-03 DIAGNOSIS — M961 Postlaminectomy syndrome, not elsewhere classified: Secondary | ICD-10-CM

## 2021-09-04 NOTE — Patient Instructions (Signed)
Visit Information  Thank you for taking time to visit with me today. Please don't hesitate to contact me if I can be of assistance to you before our next scheduled telephone appointment.  Following are the goals we discussed today:   Patient Goals/Self-Care Activities: Continue to receive personal counseling with LCSW on a bi-weekly basis, to reduce and manage symptoms of Anxiety and Depression, until well-controlled.  LCSW collaboration with Lona Kettle, Community Integration Program Coordinator with [employer] [phone]; # [phone]; [email or web address]), to confirm ineligibility for additional hours of in-home care services for your 46 year old disabled son.     LCSW's collaboration with Carrington Clamp, Qualified Professional with [employer] [phone]; [phone]; # [phone]), to confirm ineligibility for additional hours of in-home care services for your 37 year old disabled son.    ~  LCSW will continue to pursue all available options and resources. Encouraged to implement into daily practice:  Deep Breathing Exercises, Relaxation Techniques, and Mindfulness Meditation Strategies. Emphasized use of deep breathing exercises, relaxation techniques, mindfulness meditation strategies, guided imagery, distraction and diversional activities, massage, acupressure, hot/cold compresses, and Transcutaneous Electrical Nerve Stimulation Unit, to assist with pain management and pain reduction.   Contact LCSW directly (# [id number]) if you have questions, need assistance, or if additional social work needs are identified between now and our next scheduled telephone outreach call.   Follow-Up:  10/03/2021 at 1:15 pm   Please call the care guide team at [phone] if you need to cancel or reschedule your appointment.   If you are experiencing a Mental Health or Behavioral Health Crisis or need someone to talk to, please call the Suicide and Crisis  Lifeline: 988 call the [place] National Suicide Prevention Lifeline: 802 876 6690 or TTY: (667) 163-1422 TTY 212-704-8722) to talk to a trained counselor call 1-800-273-TALK (toll free, 24 hour hotline) go to [facility] [address] [place] [phone]) call the [facility] Crisis Line: [phone] call 911   Patient verbalizes understanding of instructions provided today and agrees to view in MyChart.   Danford Bad LCSW Licensed Clinical Social Worker [facility] [phone]

## 2021-09-04 NOTE — Chronic Care Management (AMB) (Signed)
Chronic Care Management    Clinical Social Work Note  09/04/2021 Name: Gary Clark MRN: [id number] DOB: 16-Feb-1971  AMARII BORDAS is a 50 y.o. year old male who is a primary care patient of Philip Aspen, Limmie Patricia, MD. The CCM team was consulted to assist the patient with chronic disease management and/or care coordination needs related to: [employer], Mental Health Counseling and Resources, and Caregiver Stress.   Engaged with patient by telephone for follow up visit in response to provider referral for social work chronic care management and care coordination services.   Consent to Services:  The patient was given information about Chronic Care Management services, agreed to services, and gave verbal consent prior to initiation of services.  Please see initial visit note for detailed documentation.   Patient agreed to services and consent obtained.   Assessment: Review of patient past medical history, allergies, medications, and health status, including review of relevant consultants reports was performed today as part of a comprehensive evaluation and provision of chronic care management and care coordination services.     SDOH (Social Determinants of Health) assessments and interventions performed:    Advanced Directives Status: Not addressed in this encounter.  CCM Care Plan  Allergies  Allergen Reactions   Codeine Other (See Comments)    Gets "loopy"    Outpatient Encounter Medications as of 09/03/2021  Medication Sig   citalopram (CELEXA) 40 MG tablet Take 1 tablet by mouth once daily   hydrochlorothiazide (HYDRODIURIL) 25 MG tablet Take 1 tablet by mouth once daily   lisinopril (ZESTRIL) 20 MG tablet Take 1 tablet by mouth once daily   ondansetron (ZOFRAN ODT) 4 MG disintegrating tablet Take 1 tablet (4 mg total) by mouth every 8 (eight) hours as needed for nausea or vomiting.   sertraline (ZOLOFT) 25 MG tablet Take 1 tablet (25 mg total) by mouth at bedtime.    tadalafil (CIALIS) 20 MG tablet Take 1 tablet (20 mg total) by mouth daily as needed for erectile dysfunction.   No facility-administered encounter medications on file as of 09/03/2021.    Patient Active Problem List   Diagnosis Date Noted   S/P lumbar fusion 11/04/2018   Degeneration of lumbar intervertebral disc 10/27/2017   History of lumbar fusion 10/27/2017   Back pain 10/01/2016   Lumbar radiculitis 07/17/2016   Lumbosacral radiculitis 05/19/2016   Patellofemoral syndrome of both knees 05/19/2016   Sacroiliac joint dysfunction of both sides 02/04/2016   Obesity 06/03/2015   Mild intermittent asthma 06/03/2015   Major depressive disorder, recurrent, in remission (HCC) 06/03/2015   Postlaminectomy syndrome, lumbar region 03/08/2015   Migraine/Cluster HAs 10/14/2012   Chronic low back pain - hx spinal stenosis, decompression surgery in 2010, 2011 10/14/2012   Hyperlipemia 10/14/2012   Hypertension 10/14/2012    Conditions to be addressed/monitored: Anxiety, Depression, and Caregiver Stress.  Limited Social Support, Mental Health Concerns, Family and Relationship Dysfunction, Social Isolation, Limited Access to Caregiver, and Lacks Knowledge of [employer].  Care Plan : LCSW Plan of Care  Updates made by Karolee Stamps, LCSW since 09/04/2021 12:00 AM     Problem: Reduce and Manage My Symptoms of Anxiety and Depression.   Priority: High     Goal: Reduce and Manage My Symptoms of Anxiety and Depression.   Start Date: 07/17/2021  Expected End Date: 10/17/2021  This Visit's Progress: On track  Recent Progress: On track  Priority: High  Note:   Current Barriers:   Acute Mental  Health needs related to Caregiver Stress, Marital Discord, Chronic Low Back Pain, Recurrent Major Depressive Disorder, Obesity and Anxiety, requires Support, Education, Resources, Referrals and Care Coordination in order to meet unmet mental health needs. Clinical Goal(s):  Patient will  work with LCSW to reduce and manage symptoms of Anxiety and Depression, until well-controlled.     Patient will increase knowledge and/or ability of:  Coping Skills, Healthy Habits, Self-Management Skills, Stress Reduction, Home Safety and Utilizing [employer] and Resources.   Clinical Interventions:  Client-Centered Therapy performed, Verbalization of Feelings encouraged, Caregiver Stress acknowledged, Emotional Support provided, Problem-Solving Solutions developed and Active Listening/Reflection utilized.    Collaboration with Primary Care Physician, Dr. Chaya Jan regarding development and update of comprehensive plan of care as evidenced by provider attestation and co-signature. Inter-disciplinary care team collaboration (see longitudinal plan of care). Patient Goals/Self-Care Activities: Continue to receive personal counseling with LCSW on a bi-weekly basis, to reduce and manage symptoms of Anxiety and Depression, until well-controlled.  LCSW collaboration with Lona Kettle, Community Integration Program Coordinator with [employer] [phone]; # [phone]; [email or web address]), to confirm ineligibility for additional hours of in-home care services for your 63 year old disabled son.     LCSW's collaboration with Carrington Clamp, Qualified Professional with [employer] [phone]; [phone]; # [phone]), to confirm ineligibility for additional hours of in-home care services for your 62 year old disabled son.    ~  LCSW will continue to pursue all available options and resources. Encouraged to implement into daily practice:  Deep Breathing Exercises, Relaxation Techniques, and Mindfulness Meditation Strategies. Emphasized use of deep breathing exercises, relaxation techniques, mindfulness meditation strategies, guided imagery, distraction and diversional activities, massage, acupressure, hot/cold compresses, and Transcutaneous  Electrical Nerve Stimulation Unit, to assist with pain management and pain reduction.   Contact LCSW directly (# [id number]) if you have questions, need assistance, or if additional social work needs are identified between now and our next scheduled telephone outreach call.  Follow-Up:  10/03/2021 at 1:15 pm      Danford Bad LCSW Licensed Clinical Social Worker [facility] [phone]

## 2021-09-12 ENCOUNTER — Emergency Department (HOSPITAL_COMMUNITY)
Admission: EM | Admit: 2021-09-12 | Discharge: 2021-09-13 | Disposition: A | Discharge status: Home / Self Care | Payer: Medicare HMO | Attending: Emergency Medicine | Admitting: Emergency Medicine

## 2021-09-12 ENCOUNTER — Encounter (HOSPITAL_COMMUNITY): Payer: Self-pay | Admitting: Emergency Medicine

## 2021-09-12 ENCOUNTER — Other Ambulatory Visit: Payer: Self-pay

## 2021-09-12 DIAGNOSIS — Z79899 Other long term (current) drug therapy: Secondary | ICD-10-CM | POA: Insufficient documentation

## 2021-09-12 DIAGNOSIS — J4520 Mild intermittent asthma, uncomplicated: Secondary | ICD-10-CM | POA: Diagnosis not present

## 2021-09-12 DIAGNOSIS — L0211 Cutaneous abscess of neck: Secondary | ICD-10-CM | POA: Insufficient documentation

## 2021-09-12 DIAGNOSIS — I10 Essential (primary) hypertension: Secondary | ICD-10-CM | POA: Insufficient documentation

## 2021-09-12 DIAGNOSIS — L0291 Cutaneous abscess, unspecified: Secondary | ICD-10-CM

## 2021-09-12 NOTE — ED Triage Notes (Signed)
Patient arrives complaining of a right lower jaw abscess. Patient states he noticed it yesterday and picked at it throughout the day and today stated that he noticed the area had swelled up and was painful to the touch. Area noted to be red and swollen.

## 2021-09-12 NOTE — ED Provider Notes (Signed)
Emergency Medicine Provider Triage Evaluation Note  Gary Clark , a 50 y.o. male  was evaluated in triage.  Pt complains of pain and swelling below the right jaw.  Symptoms started yesterday.  They have been worsening.  Denies any dental pain, fevers, chills, nausea, vomiting.  Physical Exam  BP (!) 201/143 (BP Location: Left Arm)    Pulse (!) 104    Temp 98.2 F (36.8 C) (Oral)    Resp 18    Ht 5\' 6"  (1.676 m)    Wt 99.8 kg    SpO2 100%    BMI 35.51 kg/m  Gen:   Awake, no distress   Resp:  Normal effort  MSK:   Moves extremities without difficulty  Other:    Medical Decision Making  Medically screening exam initiated at 10:24 PM.  Appropriate orders placed.  Gary Clark was informed that the remainder of the evaluation will be completed by another provider, this initial triage assessment does not replace that evaluation, and the importance of remaining in the ED until their evaluation is complete.   [name], PA-C 09/12/21 2225    09/14/21, MD 09/13/21 [phone]

## 2021-09-13 DIAGNOSIS — E785 Hyperlipidemia, unspecified: Secondary | ICD-10-CM | POA: Diagnosis not present

## 2021-09-13 DIAGNOSIS — F3340 Major depressive disorder, recurrent, in remission, unspecified: Secondary | ICD-10-CM | POA: Diagnosis not present

## 2021-09-13 DIAGNOSIS — I10 Essential (primary) hypertension: Secondary | ICD-10-CM

## 2021-09-13 MED ORDER — DOXYCYCLINE HYCLATE 100 MG PO TABS
100.0000 mg | ORAL_TABLET | Freq: Once | ORAL | Status: AC
  Administered 2021-09-13: 100 mg via ORAL
  Filled 2021-09-13: qty 1

## 2021-09-13 MED ORDER — DOXYCYCLINE HYCLATE 100 MG PO CAPS: 100.0000 mg | ORAL_CAPSULE | Freq: Two times a day (BID) | ORAL | 0 refills | Status: DC

## 2021-09-13 NOTE — Discharge Instructions (Signed)
Take the prescribed medication as directed.  Would do warm compresses to the area several times a day with warm cloth. Follow-up with your primary care doctor. Return to the ED for new or worsening symptoms-- significantly increased swelling, redness, trouble swallowing, etc.

## 2021-09-13 NOTE — ED Provider Notes (Signed)
[facility]-EMERGENCY DEPT Provider Note   CSN: [id number] Arrival date & time: 09/12/21  2103     History Chief Complaint  Patient presents with   Abscess    Gary Clark is a 50 y.o. male.  The history is provided by the patient and medical records.  Abscess  50 year old male with history of allergies, depression, hypertension, hyperlipidemia, prediabetes, sleep apnea, presenting to the ED with abscess of right neck.  States he noticed this a few days ago, has been picking at it while out of town which seems to have made it worse.  States today started having some increased redness and swelling.  He denies any fevers.  No difficulty swallowing.  He denies any dental pain.  Past Medical History:  Diagnosis Date   Allergy    Anxiety    Arthritis    Asthma    years ago   Back pain    Blood in stool    Cluster headache    Depression    Headache(784.0)    frequent    History of kidney stones    passed 2 times   Hyperlipidemia    Hypertension    Lumbosacral spondylosis without myelopathy 06/15/2013   Migraines     10/27/2018- history of. Not current   Postlaminectomy syndrome, lumbar region 03/08/2015   Status post L4-L5 decompression and fusion, anterior/posterior in 2011    Pre-diabetes    pre diabetic per his PCP   Sleep apnea    tested in 2014 positive    Stroke [facility]) 2008   pt states "mini-Stroke"-    UTI (urinary tract infection)     Patient Active Problem List   Diagnosis Date Noted   S/P lumbar fusion 11/04/2018   Degeneration of lumbar intervertebral disc 10/27/2017   History of lumbar fusion 10/27/2017   Back pain 10/01/2016   Lumbar radiculitis 07/17/2016   Lumbosacral radiculitis 05/19/2016   Patellofemoral syndrome of both knees 05/19/2016   Sacroiliac joint dysfunction of both sides 02/04/2016   Obesity 06/03/2015   Mild intermittent asthma 06/03/2015   Major depressive disorder, recurrent, in remission (HCC) 06/03/2015    Postlaminectomy syndrome, lumbar region 03/08/2015   Migraine/Cluster HAs 10/14/2012   Chronic low back pain - hx spinal stenosis, decompression surgery in 2010, 2011 10/14/2012   Hyperlipemia 10/14/2012   Hypertension 10/14/2012    Past Surgical History:  Procedure Laterality Date   ANTERIOR AND POSTERIOR SPINAL FUSION  2011   ANTERIOR LAT LUMBAR FUSION N/A 10/01/2016   Procedure: XLIF L3-4 ANTERIOR LATERAL LUMBAR FUSION 1 LEVEL;  Surgeon: Venita Lick, MD;  Location: [facility] OR;  Service: Orthopedics;  Laterality: N/A;   BACK SURGERY     HARDWARE REMOVAL N/A 10/01/2016   Procedure: Removal of pedical screws L4-5 HARDWARE REMOVAL;  Surgeon: Venita Lick, MD;  Location: [facility] OR;  Service: Orthopedics;  Laterality: N/A;   LUMBAR LAMINECTOMY/DECOMPRESSION MICRODISCECTOMY  2010       Family History  Problem Relation Age of Onset   Heart disease Mother    Hypertension Mother    Arthritis Father    Hypertension Father    Prostate cancer Father    Miscarriages / Stillbirths Maternal Grandmother    Heart disease Maternal Grandfather    Sudden death Maternal Grandfather    Breast cancer Sister    Migraines Sister     Social History   Tobacco Use   Smoking status: Never    Passive exposure: Never   Smokeless tobacco: Never  Vaping Use   Vaping Use: Never used  Substance Use Topics   Alcohol use: Yes    Alcohol/week: 10.0 standard drinks    Types: 10 Cans of beer per week   Drug use: No    Home Medications Prior to Admission medications   Medication Sig Start Date End Date Taking? Authorizing Provider  doxycycline (VIBRAMYCIN) 100 MG capsule Take 1 capsule (100 mg total) by mouth 2 (two) times daily. 09/13/21  Yes Garlon Hatchet, PA-C  citalopram (CELEXA) 40 MG tablet Take 1 tablet by mouth once daily 08/21/21   Philip Aspen, Limmie Patricia, MD  hydrochlorothiazide (HYDRODIURIL) 25 MG tablet Take 1 tablet by mouth once daily 05/14/21   Philip Aspen, Limmie Patricia, MD  lisinopril  (ZESTRIL) 20 MG tablet Take 1 tablet by mouth once daily 05/14/21   Philip Aspen, Limmie Patricia, MD  ondansetron (ZOFRAN ODT) 4 MG disintegrating tablet Take 1 tablet (4 mg total) by mouth every 8 (eight) hours as needed for nausea or vomiting. 02/03/21   Lamptey, Britta Mccreedy, MD  sertraline (ZOLOFT) 25 MG tablet Take 1 tablet (25 mg total) by mouth at bedtime. 11/07/20   Philip Aspen, Limmie Patricia, MD  tadalafil (CIALIS) 20 MG tablet Take 1 tablet (20 mg total) by mouth daily as needed for erectile dysfunction. 11/07/20   Philip Aspen, Limmie Patricia, MD  amitriptyline (ELAVIL) 25 MG tablet Take 1 tablet (25 mg total) by mouth at bedtime. 06/05/19 08/11/20  [place], Betty G, MD  cetirizine (ZYRTEC) 10 MG tablet Take 1 tablet (10 mg total) by mouth daily. 01/11/20 08/11/20  Dahlia Byes A, NP  fluticasone (FLONASE) 50 MCG/ACT nasal spray Place 2 sprays into both nostrils daily. 12/27/19 08/11/20  [place], Betty G, MD  ipratropium (ATROVENT) 0.06 % nasal spray Place 2 sprays into both nostrils 4 (four) times daily. 11/19/17 08/11/20  Ardith Dark, MD  rosuvastatin (CRESTOR) 40 MG tablet Take 1 tablet (40 mg total) by mouth daily. 10/11/18 08/11/20  Terressa Koyanagi, DO    Allergies    Codeine  Review of Systems   Review of Systems  Skin:        abscess  All other systems reviewed and are negative.  Physical Exam Updated Vital Signs BP (!) 201/143 (BP Location: Left Arm)    Pulse (!) 104    Temp 98.2 F (36.8 C) (Oral)    Resp 18    Ht 5\' 6"  (1.676 m)    Wt 99.8 kg    SpO2 100%    BMI 35.51 kg/m   Physical Exam Vitals and nursing note reviewed.  Constitutional:      Appearance: He is well-developed.  HENT:     Head: Normocephalic and atraumatic.     Comments: Abscess formation noted to right submental area, this is form to the touch without fluctuance; there is dried blood and small pustule overlying hair follicle but no active drainage, appears to have some localized lymphadenopathy as well, normal  phonation, no stridor, no tracheal deviation    Mouth/Throat:     Comments: Normal dentition, filling noted to right lower molars but no active signs of decay or dental abscess formation Eyes:     Conjunctiva/sclera: Conjunctivae normal.     Pupils: Pupils are equal, round, and reactive to light.  Cardiovascular:     Rate and Rhythm: Normal rate and regular rhythm.     Heart sounds: Normal heart sounds.  Pulmonary:     Effort: Pulmonary effort is normal.  Breath sounds: Normal breath sounds.  Abdominal:     General: Bowel sounds are normal.     Palpations: Abdomen is soft.  Musculoskeletal:        General: Normal range of motion.     Cervical back: Normal range of motion.  Skin:    General: Skin is warm and dry.  Neurological:     Mental Status: He is alert and oriented to person, place, and time.    ED Results / Procedures / Treatments   Labs (all labs ordered are listed, but only abnormal results are displayed) Labs Reviewed - No data to display  EKG None  Radiology No results found.  Procedures Procedures   Medications Ordered in ED Medications  doxycycline (VIBRA-TABS) tablet 100 mg (100 mg Oral Given 09/13/21 0134)    ED Course  I have reviewed the triage vital signs and the nursing notes.  Pertinent labs & imaging results that were available during my care of the patient were reviewed by me and considered in my medical decision making (see chart for details).    MDM Rules/Calculators/A&P                         50 y.o. M here with abscess to right neck/submental area.  He has localized pustula overlying hair follicle and dried blood without active drainage or bleeding.  Abscess itself is firm without central fluctuance.  There is no tracheal deviation, handling secretions well, no difficulty swallowing.  He is afebrile and nontoxic.  I suspect this may have began as an ingrown hair, worsened due to picking at it.  Abscess remains firm to touch and given  location, recommended trial of antibiotics, warm compresses for now and he is agreeable.  Will have him follow-up closely with PCP.  He understands to return here for any new/acute changes.   Final Clinical Impression(s) / ED Diagnoses Final diagnoses:  Abscess    Rx / DC Orders ED Discharge Orders          Ordered    doxycycline (VIBRAMYCIN) 100 MG capsule  2 times daily        09/13/21 0139             Garlon Hatchet, PA-C 09/13/21 0141    Zadie Rhine, MD 09/13/21 [phone]

## 2021-09-23 ENCOUNTER — Encounter: Payer: Medicare HMO | Admitting: Internal Medicine

## 2021-10-02 ENCOUNTER — Telehealth: Payer: Medicare HMO | Admitting: *Deleted

## 2021-10-02 ENCOUNTER — Telehealth: Payer: Self-pay | Admitting: *Deleted

## 2021-10-02 NOTE — Telephone Encounter (Signed)
°  Care Management   Follow Up Note   10/02/2021 Name: Gary Clark MRN: [id number] DOB: 1970-11-23   Referred by: Philip Aspen, Limmie Patricia, MD  Reason for referral : Chronic Care Management Needs in Patient with Hypertension, Chronic Low Back Pain, History of Lumbar Fusion, Recurrent Major Depressive Disorder, Obesity, Bilateral Sacroiliac Joint Dysfunction, Bilateral Patellofemoral Syndrome of Knees, Anxiety and Marital Discord.  An unsuccessful telephone outreach was attempted today. The patient was referred to the case management team for assistance with care management and care coordination. CSW wanted to outreach to patient to discuss clinical social work follow-up plans, as well as to explain that he will be receiving a scheduled call from the newly assigned CSW, but CSW was unable to leave a HIPAA compliant message on voicemail, even after several telephone follow-up outreach call attempts.  Patient will be able to go into MyChart and see newly assigned CSW, Jenel Lucks, in his appointment desk.  Follow-Up Plan:  Request placed with Scheduling Care Guide to reschedule patient's telephone follow-up outreach call with Jenel Lucks, CSW.  Danford Bad LCSW Licensed Clinical Social Worker [facility] [phone]

## 2021-10-03 ENCOUNTER — Telehealth: Payer: Medicare HMO | Admitting: *Deleted

## 2021-10-03 ENCOUNTER — Telehealth: Payer: Medicare HMO

## 2021-10-06 ENCOUNTER — Encounter: Payer: Self-pay | Admitting: Internal Medicine

## 2021-10-06 ENCOUNTER — Telehealth (INDEPENDENT_AMBULATORY_CARE_PROVIDER_SITE_OTHER): Payer: Medicare HMO | Admitting: Internal Medicine

## 2021-10-06 DIAGNOSIS — I10 Essential (primary) hypertension: Secondary | ICD-10-CM | POA: Diagnosis not present

## 2021-10-06 MED ORDER — LISINOPRIL 20 MG PO TABS: 20.0000 mg | ORAL_TABLET | Freq: Every day | ORAL | 0 refills | Status: DC

## 2021-10-06 MED ORDER — HYDROCHLOROTHIAZIDE 25 MG PO TABS: 25.0000 mg | ORAL_TABLET | Freq: Every day | ORAL | 0 refills | Status: DC

## 2021-10-06 NOTE — Progress Notes (Signed)
Virtual Visit via Video Note  I connected with Gary Clark on 10/06/21 at  4:00 PM EST by a video enabled telemedicine application and verified that I am speaking with the correct person using two identifiers.  Location patient: home Location provider: work office Persons participating in the virtual visit: patient, provider  I discussed the limitations of evaluation and management by telemedicine and the availability of in person appointments. The patient expressed understanding and agreed to proceed.   HPI: I have not seen him in person since May 2021.  Gary Clark tells me that Gary Clark ran out of his blood pressure medication about 2 months ago.  Gary Clark has been having a lot of headaches which Gary Clark recognizes as his blood pressure probably being elevated.  Unfortunately Gary Clark has no way of checking his blood pressure at home.  Gary Clark tells me that Gary Clark did go to the emergency department end of December after an insect bite and his systolic blood pressure was above 200 at that time.   ROS: Constitutional: Denies fever, chills, diaphoresis, appetite change and fatigue.  HEENT: Denies photophobia, eye pain, redness, hearing loss, ear pain, congestion, sore throat, rhinorrhea, sneezing, mouth sores, trouble swallowing, neck pain, neck stiffness and tinnitus.   Respiratory: Denies SOB, DOE, cough, chest tightness,  and wheezing.   Cardiovascular: Denies chest pain, palpitations and leg swelling.  Gastrointestinal: Denies nausea, vomiting, abdominal pain, diarrhea, constipation, blood in stool and abdominal distention.  Genitourinary: Denies dysuria, urgency, frequency, hematuria, flank pain and difficulty urinating.  Endocrine: Denies: hot or cold intolerance, sweats, changes in hair or nails, polyuria, polydipsia. Musculoskeletal: Denies myalgias, back pain, joint swelling, arthralgias and gait problem.  Skin: Denies pallor, rash and wound.  Neurological: Denies dizziness, seizures, syncope, weakness,  light-headedness, numbness. Hematological: Denies adenopathy. Easy bruising, personal or family bleeding history  Psychiatric/Behavioral: Denies suicidal ideation, mood changes, confusion, nervousness, sleep disturbance and agitation   Past Medical History:  Diagnosis Date   Allergy    Anxiety    Arthritis    Asthma    years ago   Back pain    Blood in stool    Cluster headache    Depression    Headache(784.0)    frequent    History of kidney stones    passed 2 times   Hyperlipidemia    Hypertension    Lumbosacral spondylosis without myelopathy 06/15/2013   Migraines     10/27/2018- history of. Not current   Postlaminectomy syndrome, lumbar region 03/08/2015   Status post L4-L5 decompression and fusion, anterior/posterior in 2011    Pre-diabetes    pre diabetic per his PCP   Sleep apnea    tested in 2014 positive    Stroke [facility]) 2008   pt states "mini-Stroke"-    UTI (urinary tract infection)     Past Surgical History:  Procedure Laterality Date   ANTERIOR AND POSTERIOR SPINAL FUSION  2011   ANTERIOR LAT LUMBAR FUSION N/A 10/01/2016   Procedure: XLIF L3-4 ANTERIOR LATERAL LUMBAR FUSION 1 LEVEL;  Surgeon: Melina Schools, MD;  Location[place];  Service: Orthopedics;  Laterality: N/A;   BACK SURGERY     HARDWARE REMOVAL N/A 10/01/2016   Procedure: Removal of pedical screws L4-5 HARDWARE REMOVAL;  Surgeon: Melina Schools, MD;  Location: [place];  Service: Orthopedics;  Laterality: N/A;   LUMBAR LAMINECTOMY/DECOMPRESSION MICRODISCECTOMY  2010    Family History  Problem Relation Age of Onset   Heart disease Mother    Hypertension Mother  Arthritis Father    Hypertension Father    Prostate cancer Father    17 / Stillbirths Maternal Grandmother    Heart disease Maternal Grandfather    Sudden death Maternal Grandfather    Breast cancer Sister    Migraines Sister     SOCIAL HX:   reports that Gary Clark has never smoked. Gary Clark has never been exposed to tobacco smoke. Gary Clark  has never used smokeless tobacco. Gary Clark reports current alcohol use of about 10.0 standard drinks per week. Gary Clark reports that Gary Clark does not use drugs.   Current Outpatient Medications:    citalopram (CELEXA) 40 MG tablet, Take 1 tablet by mouth once daily, Disp: 90 tablet, Rfl: 0   sertraline (ZOLOFT) 25 MG tablet, Take 1 tablet (25 mg total) by mouth at bedtime., Disp: 90 tablet, Rfl: 1   tadalafil (CIALIS) 20 MG tablet, Take 1 tablet (20 mg total) by mouth daily as needed for erectile dysfunction., Disp: 5 tablet, Rfl: 11   hydrochlorothiazide (HYDRODIURIL) 25 MG tablet, Take 1 tablet (25 mg total) by mouth daily., Disp: 90 tablet, Rfl: 0   lisinopril (ZESTRIL) 20 MG tablet, Take 1 tablet (20 mg total) by mouth daily., Disp: 90 tablet, Rfl: 0  EXAM:   VITALS per patient if applicable: None reported  GENERAL: alert, oriented, appears well and in no acute distress  HEENT: atraumatic, conjunttiva clear, no obvious abnormalities on inspection of external nose and ears  NECK: normal movements of the head and neck  LUNGS: on inspection no signs of respiratory distress, breathing rate appears normal, no obvious gross increased work of breathing, gasping or wheezing  CV: no obvious cyanosis  MS: moves all visible extremities without noticeable abnormality  PSYCH/NEURO: pleasant and cooperative, no obvious depression or anxiety, speech and thought processing grossly intact  ASSESSMENT AND PLAN:   Essential hypertension  - Plan: hydrochlorothiazide (HYDRODIURIL) 25 MG tablet, lisinopril (ZESTRIL) 20 MG tablet -I will give him limited refills of both lisinopril and hydrochlorothiazide, Gary Clark will call office to schedule follow-up, Gary Clark needs labs and blood pressure check.  Gary Clark understands that no further refills will be given until Gary Clark is seen in office.     I discussed the assessment and treatment plan with the patient. The patient was provided an opportunity to ask questions and all were answered.  The patient agreed with the plan and demonstrated an understanding of the instructions.   The patient was advised to call back or seek an in-person evaluation if the symptoms worsen or if the condition fails to improve as anticipated.    Lelon Frohlich, MD  [facility] at [facility]

## 2021-10-07 ENCOUNTER — Ambulatory Visit (INDEPENDENT_AMBULATORY_CARE_PROVIDER_SITE_OTHER): Payer: Medicare HMO | Admitting: Internal Medicine

## 2021-10-07 VITALS — BP 200/142 | HR 100 | Ht 64.25 in | Wt 223.0 lb

## 2021-10-07 DIAGNOSIS — I10 Essential (primary) hypertension: Secondary | ICD-10-CM

## 2021-10-07 NOTE — Progress Notes (Signed)
Established Patient Office Visit     This visit occurred during the SARS-CoV-2 public health emergency.  Safety protocols were in place, including screening questions prior to the visit, additional usage of staff PPE, and extensive cleaning of exam room while observing appropriate contact time as indicated for disinfecting solutions.    CC/Reason for Visit: Follow-up from earlier virtual visit, hypertension  HPI: Gary Clark is a 51 y.o. male who is coming in today for the above mentioned reasons.  I have not seen him since 2021.  Saw him as a video visit yesterday for complaints of headaches, he had not been on his hydrochlorothiazide or lisinopril for over 2 months.  He did not have any blood pressure measurements to share.  He was asked to come in today to follow-up.  Medications were sent yesterday but he has not yet started them.  2 in office blood pressure measurements are 200/145 and 190/120.  He denies any chest pain, shortness of breath or focal neurologic deficits.  Past Medical/Surgical History: Past Medical History:  Diagnosis Date   Allergy    Anxiety    Arthritis    Asthma    years ago   Back pain    Blood in stool    Cluster headache    Depression    Headache(784.0)    frequent    History of kidney stones    passed 2 times   Hyperlipidemia    Hypertension    Lumbosacral spondylosis without myelopathy 06/15/2013   Migraines     10/27/2018- history of. Not current   Postlaminectomy syndrome, lumbar region 03/08/2015   Status post L4-L5 decompression and fusion, anterior/posterior in 2011    Pre-diabetes    pre diabetic per his PCP   Sleep apnea    tested in 2014 positive    Stroke [facility]) 2008   pt states "mini-Stroke"-    UTI (urinary tract infection)     Past Surgical History:  Procedure Laterality Date   ANTERIOR AND POSTERIOR SPINAL FUSION  2011   ANTERIOR LAT LUMBAR FUSION N/A 10/01/2016   Procedure: XLIF L3-4 ANTERIOR LATERAL LUMBAR FUSION 1  LEVEL;  Surgeon: Melina Schools, MD;  Location: [place];  Service: Orthopedics;  Laterality: N/A;   BACK SURGERY     HARDWARE REMOVAL N/A 10/01/2016   Procedure: Removal of pedical screws L4-5 HARDWARE REMOVAL;  Surgeon: Melina Schools, MD;  Location: [place];  Service: Orthopedics;  Laterality: N/A;   LUMBAR LAMINECTOMY/DECOMPRESSION MICRODISCECTOMY  2010    Social History:  reports that he has never smoked. He has never been exposed to tobacco smoke. He has never used smokeless tobacco. He reports current alcohol use of about 10.0 standard drinks per week. He reports that he does not use drugs.  Allergies: Allergies  Allergen Reactions   Codeine Other (See Comments)    Gets "loopy"    Family History:  Family History  Problem Relation Age of Onset   Heart disease Mother    Hypertension Mother    Arthritis Father    Hypertension Father    Prostate cancer Father    51 / Stillbirths Maternal Grandmother    Heart disease Maternal Grandfather    Sudden death Maternal Grandfather    Breast cancer Sister    Migraines Sister      Current Outpatient Medications:    citalopram (CELEXA) 40 MG tablet, Take 1 tablet by mouth once daily, Disp: 90 tablet, Rfl: 0   hydrochlorothiazide (HYDRODIURIL) 25  MG tablet, Take 1 tablet (25 mg total) by mouth daily., Disp: 90 tablet, Rfl: 0   lisinopril (ZESTRIL) 20 MG tablet, Take 1 tablet (20 mg total) by mouth daily., Disp: 90 tablet, Rfl: 0   sertraline (ZOLOFT) 25 MG tablet, Take 1 tablet (25 mg total) by mouth at bedtime., Disp: 90 tablet, Rfl: 1   tadalafil (CIALIS) 20 MG tablet, Take 1 tablet (20 mg total) by mouth daily as needed for erectile dysfunction., Disp: 5 tablet, Rfl: 11  Review of Systems:  Constitutional: Denies fever, chills, diaphoresis, appetite change and fatigue.  HEENT: Denies photophobia, eye pain, redness, hearing loss, ear pain, congestion, sore throat, rhinorrhea, sneezing, mouth sores, trouble swallowing, neck pain,  neck stiffness and tinnitus.   Respiratory: Denies SOB, DOE, cough, chest tightness,  and wheezing.   Cardiovascular: Denies chest pain, palpitations and leg swelling.  Gastrointestinal: Denies nausea, vomiting, abdominal pain, diarrhea, constipation, blood in stool and abdominal distention.  Genitourinary: Denies dysuria, urgency, frequency, hematuria, flank pain and difficulty urinating.  Endocrine: Denies: hot or cold intolerance, sweats, changes in hair or nails, polyuria, polydipsia. Musculoskeletal: Denies myalgias, back pain, joint swelling, arthralgias and gait problem.  Skin: Denies pallor, rash and wound.  Neurological: Denies dizziness, seizures, syncope, weakness, light-headedness, numbness. Hematological: Denies adenopathy. Easy bruising, personal or family bleeding history  Psychiatric/Behavioral: Denies suicidal ideation, mood changes, confusion, nervousness, sleep disturbance and agitation    Physical Exam: Vitals:   10/07/21 1526  BP: (!) 200/142  Pulse: 100  SpO2: 97%  Weight: 223 lb (101.2 kg)  Height: 5' 4.25" (1.632 m)    Body mass index is 37.98 kg/m.   Constitutional: NAD, calm, comfortable Eyes: PERRL, lids and conjunctivae normal ENMT: Mucous membranes are moist.  Respiratory: clear to auscultation bilaterally, no wheezing, no crackles. Normal respiratory effort. No accessory muscle use.  Cardiovascular: Regular rate and rhythm, no murmurs / rubs / gallops. No extremity edema.  Neurologic: Grossly intact and nonfocal Psychiatric: Normal judgment and insight. Alert and oriented x 3. Normal mood.    Impression and Plan:  Malignant hypertension  - Plan: CBC with Differential/Platelet, Comprehensive metabolic panel, Lipid panel -He will pick up metformin, hydrochlorothiazide and resume immediately. -Advised to do ambulatory blood pressure monitoring, I have scheduled him for an in person visit in another 2 weeks.  Given lack of chest pain, shortness of  breath or focal neurologic deficit, I think we can slowly bring his blood pressure down as an outpatient as opposed to admitting him for hypertensive urgency.  Time spent: 31 minutes reviewing chart, interviewing and examining patient and formulating plan of care.     Lelon Frohlich, MD [facility] at [facility]

## 2021-10-08 LAB — LIPID PANEL
Cholesterol: 285 mg/dL — ABNORMAL HIGH (ref 0–200)
HDL: 51.2 mg/dL (ref >39.00)
NonHDL: 233.78
Total CHOL/HDL Ratio: 6
Triglycerides: 355 mg/dL — ABNORMAL HIGH (ref 0.0–149.0)
VLDL: 71 mg/dL — ABNORMAL HIGH (ref 0.0–40.0)

## 2021-10-08 LAB — COMPREHENSIVE METABOLIC PANEL
ALT: 26 U/L (ref 0–53)
AST: 22 U/L (ref 0–37)
Albumin: 4.7 g/dL (ref 3.5–5.2)
Alkaline Phosphatase: 69 U/L (ref 39–117)
BUN: 17 mg/dL (ref 6–23)
CO2: 28 mEq/L (ref 19–32)
Calcium: 10.2 mg/dL (ref 8.4–10.5)
Chloride: 102 mEq/L (ref 96–112)
Creatinine, Ser: 1.03 mg/dL (ref 0.40–1.50)
GFR: 84.01 mL/min (ref >60.00)
Glucose, Bld: 97 mg/dL (ref 70–99)
Potassium: 3.8 mEq/L (ref 3.5–5.1)
Sodium: 139 mEq/L (ref 135–145)
Total Bilirubin: 0.5 mg/dL (ref 0.2–1.2)
Total Protein: 7.7 g/dL (ref 6.0–8.3)

## 2021-10-08 LAB — CBC WITH DIFFERENTIAL/PLATELET
Basophils Absolute: 0.1 10*3/uL (ref 0.0–0.1)
Basophils Relative: 0.9 % (ref 0.0–3.0)
Eosinophils Absolute: 0.2 10*3/uL (ref 0.0–0.7)
Eosinophils Relative: 2.7 % (ref 0.0–5.0)
HCT: 45 % (ref 39.0–52.0)
Hemoglobin: 15.2 g/dL (ref 13.0–17.0)
Lymphocytes Relative: 30.1 % (ref 12.0–46.0)
Lymphs Abs: 2.5 10*3/uL (ref 0.7–4.0)
MCHC: 33.8 g/dL (ref 30.0–36.0)
MCV: 95.4 fl (ref 78.0–100.0)
Monocytes Absolute: 0.6 10*3/uL (ref 0.1–1.0)
Monocytes Relative: 7.6 % (ref 3.0–12.0)
Neutro Abs: 4.9 10*3/uL (ref 1.4–7.7)
Neutrophils Relative %: 58.7 % (ref 43.0–77.0)
Platelets: 295 10*3/uL (ref 150.0–400.0)
RBC: 4.71 Mil/uL (ref 4.22–5.81)
RDW: 13 % (ref 11.5–15.5)
WBC: 8.4 10*3/uL (ref 4.0–10.5)

## 2021-10-08 LAB — LDL CHOLESTEROL, DIRECT: Direct LDL: 191 mg/dL

## 2021-10-09 ENCOUNTER — Other Ambulatory Visit: Payer: Self-pay | Admitting: Internal Medicine

## 2021-10-09 ENCOUNTER — Other Ambulatory Visit: Payer: Self-pay

## 2021-10-09 DIAGNOSIS — E782 Mixed hyperlipidemia: Secondary | ICD-10-CM

## 2021-10-09 MED ORDER — ATORVASTATIN CALCIUM 40 MG PO TABS: 40.0000 mg | ORAL_TABLET | Freq: Every day | ORAL | 1 refills | Status: DC

## 2021-10-09 NOTE — Progress Notes (Signed)
Scheduled fu with SW 10/17/21  Gary Clark  Care Guide, Embedded Care Coordination [facility] Management  Direct Dial: [phone]

## 2021-10-17 ENCOUNTER — Telehealth: Payer: Medicare HMO

## 2021-10-20 ENCOUNTER — Telehealth: Payer: Self-pay | Admitting: *Deleted

## 2021-10-20 ENCOUNTER — Telehealth: Payer: Medicare HMO

## 2021-10-20 NOTE — Chronic Care Management (AMB) (Signed)
°  Care Management   Note  10/20/2021 Name: Gary Clark MRN: [id number] DOB: 1971/08/09  Gary Clark is a 51 y.o. year old male who is a primary care patient of Philip Aspen, Limmie Patricia, MD and is actively engaged with the care management team. I reached out to [name] by phone today to assist with re-scheduling a follow up visit with the RN Case Manager  Follow up plan: Unsuccessful telephone outreach attempt made. A HIPAA compliant phone message was left for the patient providing contact information and requesting a return call.  The care management team will reach out to the patient again over the next 7 days.  If patient returns call to provider office, please advise to call Embedded Care Management Care Guide Misty Stanley at [phone]  [facility] Guide, Embedded Care Coordination [facility]   Care Management  Direct Dial: [phone]

## 2021-10-21 ENCOUNTER — Telehealth: Payer: Self-pay | Admitting: Licensed Clinical Social Worker

## 2021-10-21 NOTE — Telephone Encounter (Signed)
° °   Clinical Social Work  Care Management   Phone Outreach    10/21/2021 Name: Gary Clark MRN: [id number] DOB: 06-03-1971  Gary Clark is a 51 y.o. year old male who is a primary care patient of Philip Aspen, Limmie Patricia, MD .   Reason for referral: Mental Health Counseling and Resources.    F/U phone call today to assess needs, progress and barriers with care plan goals.   Telephone outreach was unsuccessful. 2nd unsuccessful telephone outreach attempt.  If unable to reach patient by phone on the 3rd attempt, will discontinue outreach calls but will be available at any time to provide services.   Plan:CCM LCSW will wait for return call. If no return call is received, Will route chart to Care Guide to see if patient would like to reschedule phone appointment   Review of patient status, including review of consultants reports, relevant laboratory and other test results, and collaboration with appropriate care team members and the patient's provider was performed as part of comprehensive patient evaluation and provision of care management services.    Jenel Lucks, MSW, LCSW [place] [facility] Care Management [place]   [facility] [email or web address] Phone [phone] 6:56 AM

## 2021-10-23 NOTE — Chronic Care Management (AMB) (Signed)
°  Care Management   Note  10/23/2021 Name: Gary Clark MRN: [id number] DOB: 10/28/1970  Gary Clark is a 51 y.o. year old male who is a primary care patient of Isaac Bliss, Rayford Halsted, MD and is actively engaged with the care management team. I reached out to Inez Pilgrim by phone today to assist with re-scheduling a follow up visit with the RN Case Manager and Licensed Clinical Social Worker  Follow up plan: Telephone appointment with care management team member scheduled [id number] 10/27/21 and SW 10/31/21  [place] Management  Direct Dial: [phone]

## 2021-10-27 ENCOUNTER — Telehealth: Payer: Self-pay

## 2021-10-27 ENCOUNTER — Telehealth: Payer: Medicare HMO

## 2021-10-27 NOTE — Telephone Encounter (Signed)
°  Care Management   Follow Up Note   10/27/2021 Name: Gary Clark MRN: [id number] DOB: [birth year]   Referred by: Isaac Bliss, Rayford Halsted, MD Reason for referral : Chronic Care Management (RNCM: Follow up Outreach Chronic Care Management and coordination needs-unsuccessful attempt)   An unsuccessful telephone outreach was attempted today. The patient was referred to the case management team for assistance with care management and care coordination.   Follow Up Plan: The care management team will reach out to the patient again over the next 30 days.  Peter Garter RN, Jackquline Denmark, CDE Care Management Coordinator [facility] [phone]

## 2021-10-31 ENCOUNTER — Ambulatory Visit: Payer: Medicare HMO | Admitting: Licensed Clinical Social Worker

## 2021-10-31 DIAGNOSIS — F3340 Major depressive disorder, recurrent, in remission, unspecified: Secondary | ICD-10-CM

## 2021-10-31 DIAGNOSIS — I10 Essential (primary) hypertension: Secondary | ICD-10-CM

## 2021-10-31 NOTE — Chronic Care Management (AMB) (Signed)
° °   Clinical Social Work  Care Management   Phone Outreach    10/31/2021 Name: BIAGIO SNELSON MRN: [id number] DOB: 10-Jul-1971  AENGUS SAUCEDA is a 51 y.o. year old male who is a primary care patient of Philip Aspen, Limmie Patricia, MD .   Reason for referral: Caregiver Stress.    CCM LCSW reached out to patient's caregiver today by phone to introduce self, assess needs and offer Care Management services and interventions.    Patient unable to keep phone appointment today and requested to reschedule.  Plan:Appointment was rescheduled with CCM LCSW on Tuesday, 11/04/21   Review of patient status, including review of consultants reports, relevant laboratory and other test results, and collaboration with appropriate care team members and the patient's provider was performed as part of comprehensive patient evaluation and provision of care management services.    Jenel Lucks, MSW, LCSW [employer] [facility]   [facility] [email or web address] Phone [phone] 10:04 AM

## 2021-11-17 NOTE — Telephone Encounter (Signed)
Rescheduled for [facility] 11/20/21 and SW for 3/23 ? ?Laverda Sorenson  ?Care Guide, Embedded Care Coordination ?[place]  Care Management  ?Direct Dial: [phone] ? ?

## 2021-11-20 ENCOUNTER — Ambulatory Visit (INDEPENDENT_AMBULATORY_CARE_PROVIDER_SITE_OTHER): Payer: Medicare HMO

## 2021-11-20 DIAGNOSIS — G8929 Other chronic pain: Secondary | ICD-10-CM

## 2021-11-20 DIAGNOSIS — I10 Essential (primary) hypertension: Secondary | ICD-10-CM

## 2021-11-20 DIAGNOSIS — E782 Mixed hyperlipidemia: Secondary | ICD-10-CM

## 2021-11-20 DIAGNOSIS — F3340 Major depressive disorder, recurrent, in remission, unspecified: Secondary | ICD-10-CM

## 2021-11-20 NOTE — Chronic Care Management (AMB) (Signed)
Chronic Care Management   CCM RN Visit Note  11/20/2021 Name: Gary Clark MRN: [id number] DOB: [birth year]  Subjective: Gary Clark is a 51 y.o. year old male who is a primary care patient of Isaac Bliss, Rayford Halsted, MD. The care management team was consulted for assistance with disease management and care coordination needs.    Engaged with patient by telephone for follow up visit in response to provider referral for case management and/or care coordination services.   Consent to Services:  The patient was given information about Chronic Care Management services, agreed to services, and gave verbal consent prior to initiation of services.  Please see initial visit note for detailed documentation.   Patient agreed to services and verbal consent obtained.   Assessment: Review of patient past medical history, allergies, medications, health status, including review of consultants reports, laboratory and other test data, was performed as part of comprehensive evaluation and provision of chronic care management services.   SDOH (Social Determinants of Health) assessments and interventions performed:    CCM Care Plan  Allergies  Allergen Reactions   Codeine Other (See Comments)    Gets "loopy"    Outpatient Encounter Medications as of 11/20/2021  Medication Sig   atorvastatin (LIPITOR) 40 MG tablet Take 1 tablet (40 mg total) by mouth daily.   citalopram (CELEXA) 40 MG tablet Take 1 tablet by mouth once daily   hydrochlorothiazide (HYDRODIURIL) 25 MG tablet Take 1 tablet (25 mg total) by mouth daily.   lisinopril (ZESTRIL) 20 MG tablet Take 1 tablet (20 mg total) by mouth daily.   sertraline (ZOLOFT) 25 MG tablet Take 1 tablet (25 mg total) by mouth at bedtime.   tadalafil (CIALIS) 20 MG tablet Take 1 tablet (20 mg total) by mouth daily as needed for erectile dysfunction.   [DISCONTINUED] amitriptyline (ELAVIL) 25 MG tablet Take 1 tablet (25 mg total) by mouth at bedtime.    [DISCONTINUED] cetirizine (ZYRTEC) 10 MG tablet Take 1 tablet (10 mg total) by mouth daily.   [DISCONTINUED] fluticasone (FLONASE) 50 MCG/ACT nasal spray Place 2 sprays into both nostrils daily.   [DISCONTINUED] ipratropium (ATROVENT) 0.06 % nasal spray Place 2 sprays into both nostrils 4 (four) times daily.   [DISCONTINUED] rosuvastatin (CRESTOR) 40 MG tablet Take 1 tablet (40 mg total) by mouth daily.   No facility-administered encounter medications on file as of 11/20/2021.    Patient Active Problem List   Diagnosis Date Noted   S/P lumbar fusion 11/04/2018   Degeneration of lumbar intervertebral disc 10/27/2017   History of lumbar fusion 10/27/2017   Back pain 10/01/2016   Lumbar radiculitis 07/17/2016   Lumbosacral radiculitis 05/19/2016   Patellofemoral syndrome of both knees 05/19/2016   Sacroiliac joint dysfunction of both sides 02/04/2016   Obesity 06/03/2015   Mild intermittent asthma 06/03/2015   Major depressive disorder, recurrent, in remission ([place]) 06/03/2015   Postlaminectomy syndrome, lumbar region 03/08/2015   Migraine/Cluster HAs 10/14/2012   Chronic low back pain - hx spinal stenosis, decompression surgery in 2010, 2011 10/14/2012   Hyperlipemia 10/14/2012   Hypertension 10/14/2012    Conditions to be addressed/monitored:HTN, HLD, Depression, and chronic pain  Care Plan : RN Care Manager Plan of Care  Updates made by Dimitri Ped, RN since 11/20/2021 12:00 AM     Problem: Chronic Disease Management and Care Coordination Needs (HTN, HLD,chronic pain depression)   Priority: High     Long-Range Goal: Establish Plan of Care for Chronic Disease Management Needs(HTN,  HLD,chronic pain, depression)   Start Date: 07/07/2021  Expected End Date: 01/03/2022  Recent Progress: On track  Priority: High  Note:   Current Barriers:  Care Coordination needs related to Mental Health Concerns  and caregiver stress Chronic Disease Management support and education needs  related to HTN, HLD, and Depression chronic pain Pt states that he has a lot of stress, anxiety and some depression as he has to care for his 87 yr old son with autism and his elderly parents are now living with him.  He is in the process of getting divorced.   States he is feeling better and he is now taking his medications as ordered. States he has not been checking his B/P at home as his Mother can not find her monitor.  States he has checked at  [facility] and it was around 150/80 which is much better for him.   States he is trying to eat healthier but he will have ramen noodles for lunch and skips breakfast.  States he is working with the Education officer, museum to help him with his caregiver stress.  States his back pain is worse when he walks.  States he has been trying to walk more and will walk about one block  RNCM Clinical Goal(s):  Patient will verbalize understanding of plan for management of HTN, HLD, Depression, and chronic pain as evidenced by voiced adherence to plan of care verbalize basic understanding of  HTN, HLD, Depression, and chronic pain disease process and self health management plan as evidenced by voiced understanding take all medications exactly as prescribed and will call provider for medication related questions as evidenced by dispense report and pt verbalization attend all scheduled medical appointments: CCM LCSW 12/04/21, lab 01/09/22 as evidenced by medical records demonstrate Improved adherence to prescribed treatment plan for HTN, HLD, Depression, and Chronic pain as evidenced by readings within limits, voiced adherence to plan of care continue to work with RN Care Manager to address care management and care coordination needs related to  HTN, HLD, Depression, and chronic pain as evidenced by adherence to CM Team Scheduled appointments work with Education officer, museum to address  related to the management of Mental Health Concerns  and caregiver stress related to the management of HTN, HLD,  Depression, and chronic pain as evidenced by review of EMR and patient or Education officer, museum report through collaboration with Consulting civil engineer, provider, and care team.   Interventions: 1:1 collaboration with primary care provider regarding development and update of comprehensive plan of care as evidenced by provider attestation and co-signature Inter-disciplinary care team collaboration (see longitudinal plan of care) Evaluation of current treatment plan related to  self management and patient's adherence to plan as established by provider    Hyperlipidemia Interventions:  (Status:  Goal on track:  NO.) Long Term Goal Medication review performed; medication list updated in electronic medical record.  Provider established cholesterol goals reviewed Counseled on importance of regular laboratory monitoring as prescribed Reviewed role and benefits of statin for ASCVD risk reduction Reviewed importance of limiting foods high in cholesterol Reviewed exercise goals and target of 150 minutes per week Reviewed to try to eat more fiber along with lower fat. Reviewed to keep appointment for labs on 01/09/22  Hypertension Interventions:  (Status:  Goal on track:  NO.) Long Term Goal Last practice recorded BP readings:  BP Readings from Last 3 Encounters:  10/07/21 (!) 200/142  09/13/21 (!) 160/100  02/03/21 123/87  Most recent eGFR/CrCl: No results found  for: EGFR  No components found for: CRCL  Evaluation of current treatment plan related to hypertension self management and patient's adherence to plan as established by provider Provided education to patient re: stroke prevention, s/s of heart attack and stroke Reviewed medications with patient and discussed importance of compliance Provided assistance with obtaining home blood pressure monitor via over the counter benefit from insurance; Counseled on the importance of exercise goals with target of 150 minutes per week Discussed plans with patient for  ongoing care management follow up and provided patient with direct contact information for care management team Advised patient, providing education and rationale, to monitor blood pressure daily and record, calling PCP for findings outside established parameters Provided education on prescribed diet low sodium low fat Discussed complications of poorly controlled blood pressure such as heart disease, stroke, circulatory complications, vision complications, kidney impairment, sexual dysfunction Reviewed to try to avoid eating ramen noodle.  Reviewed quick healthier options to make.  Reviewed to try protein drinks if he is unable to prepare something to eat  Pain Interventions:  (Status:  Goal on track:  Yes.) Long Term Goal Pain assessment performed Medications reviewed Reviewed provider established plan for pain management Counseled on the importance of reporting any/all new or changed pain symptoms or management strategies to pain management provider Advised patient to report to care team affect of pain on daily activities Discussed use of relaxation techniques and/or diversional activities to assist with pain reduction (distraction, imagery, relaxation, massage, acupressure, TENS, heat, and cold application Reviewed with patient prescribed pharmacological and nonpharmacological pain relief strategies Reviewed to call and schedule follow up appointment with his back doctor. Reviewed to continue to work with LCSW for stress management and depression      Patient Goals/Self-Care Activities: Take all medications as prescribed Attend all scheduled provider appointments Call pharmacy for medication refills 3-7 days in advance of running out of medications Perform all self care activities independently  Call provider office for new concerns or questions  Work with the social worker to address care coordination needs and will continue to work with the clinical team to address health care and disease  management related needs check blood pressure 3 times per week choose a place to take my blood pressure (home, clinic or office, retail store) take blood pressure log to all doctor appointments call doctor for signs and symptoms of high blood pressure take medications for blood pressure exactly as prescribed eat more whole grains, fruits and vegetables, lean meats and healthy fats limit salt intake to $RemoveB'2300mg'XHijwggV$ /day Contact insurance company to obtain B/P monitor through your over the counter benefit call for medicine refill 2 or 3 days before it runs out take all medications exactly as prescribed call doctor with any symptoms you believe are related to your medicine develop an exercise routine  Follow Up Plan:  Telephone follow up appointment with care management team member scheduled for:  02/02/22 The patient has been provided with contact information for the care management team and has been advised to call with any health related questions or concerns.       Plan:Telephone follow up appointment with care management team member scheduled for:  02/02/22 The patient has been provided with contact information for the care management team and has been advised to call with any health related questions or concerns.  Peter Garter RN, Jackquline Denmark, CDE Care Management Coordinator [facility] [phone]

## 2021-11-20 NOTE — Patient Instructions (Signed)
Visit Information  Thank you for taking time to visit with me today. Please don't hesitate to contact me if I can be of assistance to you before our next scheduled telephone appointment.  Following are the goals we discussed today:  Take all medications as prescribed Attend all scheduled provider appointments Call pharmacy for medication refills 3-7 days in advance of running out of medications Perform all self care activities independently  Call provider office for new concerns or questions  Work with the social worker to address care coordination needs and will continue to work with the clinical team to address health care and disease management related needs check blood pressure 3 times per week choose a place to take my blood pressure (home, clinic or office, retail store) take blood pressure log to all doctor appointments call doctor for signs and symptoms of high blood pressure take medications for blood pressure exactly as prescribed eat more whole grains, fruits and vegetables, lean meats and healthy fats limit salt intake to 2300mg /day Contact insurance company to obtain B/P monitor through your over the counter benefit call for medicine refill 2 or 3 days before it runs out take all medications exactly as prescribed call doctor with any symptoms you believe are related to your medicine develop an exercise routine Low-Sodium Eating Plan Sodium, which is an element that makes up salt, helps you maintain a healthy balance of fluids in your body. Too much sodium can increase your blood pressure and cause fluid and waste to be held in your body. Your health care provider or dietitian may recommend following this plan if you have high blood pressure (hypertension), kidney disease, liver disease, or heart failure. Eating less sodium can help lower your blood pressure, reduce swelling, and protect your heart, liver, and kidneys. What are tips for following this plan? Reading food  labels The Nutrition Facts label lists the amount of sodium in one serving of the food. If you eat more than one serving, you must multiply the listed amount of sodium by the number of servings. Choose foods with less than 140 mg of sodium per serving. Avoid foods with 300 mg of sodium or more per serving. Shopping  Look for lower-sodium products, often labeled as "low-sodium" or "no salt added." Always check the sodium content, even if foods are labeled as "unsalted" or "no salt added." Buy fresh foods. Avoid canned foods and pre-made or frozen meals. Avoid canned, cured, or processed meats. Buy breads that have less than 80 mg of sodium per slice. Cooking  Eat more home-cooked food and less restaurant, buffet, and fast food. Avoid adding salt when cooking. Use salt-free seasonings or herbs instead of table salt or sea salt. Check with your health care provider or pharmacist before using salt substitutes. Cook with plant-based oils, such as canola, sunflower, or olive oil. Meal planning When eating at a restaurant, ask that your food be prepared with less salt or no salt, if possible. Avoid dishes labeled as brined, pickled, cured, smoked, or made with soy sauce, miso, or teriyaki sauce. Avoid foods that contain MSG (monosodium glutamate). MSG is sometimes added to food, bouillon, and some canned foods. Make meals that can be grilled, baked, poached, roasted, or steamed. These are generally made with less sodium. General information Most people on this plan should limit their sodium intake to 1,500-2,000 mg (milligrams) of sodium each day. What foods should I eat? Fruits Fresh, frozen, or canned fruit. Fruit juice. Vegetables Fresh or frozen vegetables. "No  salt added" canned vegetables. "No salt added" tomato sauce and paste. Low-sodium or reduced-sodium tomato and vegetable juice. Grains Low-sodium cereals, including oats, puffed wheat and rice, and shredded wheat. Low-sodium  crackers. Unsalted rice. Unsalted pasta. Low-sodium bread. Whole-grain breads and whole-grain pasta. Meats and other proteins Fresh or frozen (no salt added) meat, poultry, seafood, and fish. Low-sodium canned tuna and salmon. Unsalted nuts. Dried peas, beans, and lentils without added salt. Unsalted canned beans. Eggs. Unsalted nut butters. Dairy Milk. Soy milk. Cheese that is naturally low in sodium, such as ricotta cheese, fresh mozzarella, or Swiss cheese. Low-sodium or reduced-sodium cheese. Cream cheese. Yogurt. Seasonings and condiments Fresh and dried herbs and spices. Salt-free seasonings. Low-sodium mustard and ketchup. Sodium-free salad dressing. Sodium-free light mayonnaise. Fresh or refrigerated horseradish. Lemon juice. Vinegar. Other foods Homemade, reduced-sodium, or low-sodium soups. Unsalted popcorn and pretzels. Low-salt or salt-free chips. The items listed above may not be a complete list of foods and beverages you can eat. Contact a dietitian for more information. What foods should I avoid? Vegetables Sauerkraut, pickled vegetables, and relishes. Olives. JamaicaFrench fries. Onion rings. Regular canned vegetables (not low-sodium or reduced-sodium). Regular canned tomato sauce and paste (not low-sodium or reduced-sodium). Regular tomato and vegetable juice (not low-sodium or reduced-sodium). Frozen vegetables in sauces. Grains Instant hot cereals. Bread stuffing, pancake, and biscuit mixes. Croutons. Seasoned rice or pasta mixes. Noodle soup cups. Boxed or frozen macaroni and cheese. Regular salted crackers. Self-rising flour. Meats and other proteins Meat or fish that is salted, canned, smoked, spiced, or pickled. Precooked or cured meat, such as sausages or meat loaves. Tomasa BlaseBacon. Ham. Pepperoni. Hot dogs. Corned beef. Chipped beef. Salt pork. Jerky. Pickled herring. Anchovies and sardines. Regular canned tuna. Salted nuts. Dairy Processed cheese and cheese spreads. Hard cheeses. Cheese  curds. Blue cheese. Feta cheese. String cheese. Regular cottage cheese. Buttermilk. Canned milk. Fats and oils Salted butter. Regular margarine. Ghee. Bacon fat. Seasonings and condiments Onion salt, garlic salt, seasoned salt, table salt, and sea salt. Canned and packaged gravies. Worcestershire sauce. Tartar sauce. Barbecue sauce. Teriyaki sauce. Soy sauce, including reduced-sodium. Steak sauce. Fish sauce. Oyster sauce. Cocktail sauce. Horseradish that you find on the shelf. Regular ketchup and mustard. Meat flavorings and tenderizers. Bouillon cubes. Hot sauce. Pre-made or packaged marinades. Pre-made or packaged taco seasonings. Relishes. Regular salad dressings. Salsa. Other foods Salted popcorn and pretzels. Corn chips and puffs. Potato and tortilla chips. Canned or dried soups. Pizza. Frozen entrees and pot pies. The items listed above may not be a complete list of foods and beverages you should avoid. Contact a dietitian for more information. Summary Eating less sodium can help lower your blood pressure, reduce swelling, and protect your heart, liver, and kidneys. Most people on this plan should limit their sodium intake to 1,500-2,000 mg (milligrams) of sodium each day. Canned, boxed, and frozen foods are high in sodium. Restaurant foods, fast foods, and pizza are also very high in sodium. You also get sodium by adding salt to food. Try to cook at home, eat more fresh fruits and vegetables, and eat less fast food and canned, processed, or prepared foods. This information is not intended to replace advice given to you by your health care provider. Make sure you discuss any questions you have with your health care provider. Document Revised: 10/06/2019 Document Reviewed: 08/02/2019 Elsevier Patient Education  2022 [employer]  Our next appointment is by telephone on 02/02/22 at 2:15 PM  Please call the care guide team at [phone]  if you need to cancel or reschedule your appointment.    If you are experiencing a Mental Health or Behavioral Health Crisis or need someone to talk to, please call the Suicide and Crisis Lifeline: 988 call the [place] National Suicide Prevention Lifeline: 302-071-0992 or TTY: 934-824-0524 TTY 618 866 4044) to talk to a trained counselor call 1-800-273-TALK (toll free, 24 hour hotline) go to [facility] [address] [place] [phone]) call 911   Patient verbalizes understanding of instructions and care plan provided today and agrees to view in MyChart. Active MyChart status confirmed with patient.    Dudley Major RN, Maximiano Coss, CDE Care Management Coordinator [facility] [phone]

## 2021-12-04 ENCOUNTER — Telehealth: Payer: Medicare HMO

## 2021-12-12 DIAGNOSIS — E785 Hyperlipidemia, unspecified: Secondary | ICD-10-CM

## 2021-12-12 DIAGNOSIS — I10 Essential (primary) hypertension: Secondary | ICD-10-CM | POA: Diagnosis not present

## 2021-12-17 ENCOUNTER — Telehealth: Payer: Self-pay | Admitting: Internal Medicine

## 2021-12-17 DIAGNOSIS — F3340 Major depressive disorder, recurrent, in remission, unspecified: Secondary | ICD-10-CM

## 2021-12-17 NOTE — Telephone Encounter (Signed)
Pt notified that appt needed for CPE (& blood pressure check). Pt declines to schedule appt due to childcare issues.  ?

## 2021-12-17 NOTE — Telephone Encounter (Signed)
Pt call and stated her need a refill on citalopram (CELEXA) 40 MG tablet  sent to  ?[id number] - [place] ([place]), [place] - [address] Phone:  [phone]  ?Fax:  [phone]  ?  ? ?

## 2021-12-18 MED ORDER — CITALOPRAM HYDROBROMIDE 40 MG PO TABS: 40.0000 mg | ORAL_TABLET | Freq: Every day | ORAL | 0 refills | Status: DC

## 2021-12-18 NOTE — Telephone Encounter (Signed)
Patient is aware.  Rx sent. Appointment scheduled. ?

## 2021-12-18 NOTE — Addendum Note (Signed)
Addended by: Kern Reap B on: 12/18/2021 09:18 AM ? ? Modules accepted: Orders ? ?

## 2021-12-31 ENCOUNTER — Encounter: Payer: Self-pay | Admitting: Internal Medicine

## 2021-12-31 ENCOUNTER — Ambulatory Visit (INDEPENDENT_AMBULATORY_CARE_PROVIDER_SITE_OTHER): Payer: Medicare PPO | Admitting: Internal Medicine

## 2021-12-31 VITALS — BP 130/90 | HR 89 | Temp 98.4°F | Wt 227.9 lb

## 2021-12-31 DIAGNOSIS — I10 Essential (primary) hypertension: Secondary | ICD-10-CM

## 2021-12-31 DIAGNOSIS — E782 Mixed hyperlipidemia: Secondary | ICD-10-CM

## 2021-12-31 DIAGNOSIS — N529 Male erectile dysfunction, unspecified: Secondary | ICD-10-CM | POA: Diagnosis not present

## 2021-12-31 DIAGNOSIS — F3340 Major depressive disorder, recurrent, in remission, unspecified: Secondary | ICD-10-CM

## 2021-12-31 LAB — LIPID PANEL
Cholesterol: 186 mg/dL (ref 0–200)
HDL: 55.2 mg/dL (ref >39.00)
LDL Cholesterol: 106 mg/dL — ABNORMAL HIGH (ref 0–99)
NonHDL: 130.77
Total CHOL/HDL Ratio: 3
Triglycerides: 123 mg/dL (ref 0.0–149.0)
VLDL: 24.6 mg/dL (ref 0.0–40.0)

## 2021-12-31 LAB — COMPREHENSIVE METABOLIC PANEL
ALT: 32 U/L (ref 0–53)
AST: 24 U/L (ref 0–37)
Albumin: 4.7 g/dL (ref 3.5–5.2)
Alkaline Phosphatase: 61 U/L (ref 39–117)
BUN: 23 mg/dL (ref 6–23)
CO2: 26 mEq/L (ref 19–32)
Calcium: 9.7 mg/dL (ref 8.4–10.5)
Chloride: 103 mEq/L (ref 96–112)
Creatinine, Ser: 1.12 mg/dL (ref 0.40–1.50)
GFR: 75.86 mL/min (ref >60.00)
Glucose, Bld: 117 mg/dL — ABNORMAL HIGH (ref 70–99)
Potassium: 4.5 mEq/L (ref 3.5–5.1)
Sodium: 137 mEq/L (ref 135–145)
Total Bilirubin: 0.4 mg/dL (ref 0.2–1.2)
Total Protein: 7.6 g/dL (ref 6.0–8.3)

## 2021-12-31 MED ORDER — LISINOPRIL 40 MG PO TABS: 40.0000 mg | ORAL_TABLET | Freq: Every day | ORAL | 1 refills | Status: DC

## 2021-12-31 MED ORDER — HYDROCHLOROTHIAZIDE 25 MG PO TABS: 25.0000 mg | ORAL_TABLET | Freq: Every day | ORAL | 1 refills | Status: DC

## 2021-12-31 MED ORDER — CITALOPRAM HYDROBROMIDE 40 MG PO TABS: 40.0000 mg | ORAL_TABLET | Freq: Every day | ORAL | 1 refills | Status: DC

## 2021-12-31 MED ORDER — ATORVASTATIN CALCIUM 40 MG PO TABS: 40.0000 mg | ORAL_TABLET | Freq: Every day | ORAL | 1 refills | Status: DC

## 2021-12-31 MED ORDER — TADALAFIL 20 MG PO TABS: 20.0000 mg | ORAL_TABLET | Freq: Every day | ORAL | 11 refills | Status: DC | PRN

## 2021-12-31 MED ORDER — SERTRALINE HCL 25 MG PO TABS: 25.0000 mg | ORAL_TABLET | Freq: Every day | ORAL | 1 refills | Status: DC

## 2021-12-31 NOTE — Patient Instructions (Signed)
-  Nice seeing you today!!' ? ?-Lab work today; will notify you once results are available. ? ?-Increase lisinopril to 40 mg daily. ? ?-Schedule follow up in 3 months. ?

## 2021-12-31 NOTE — Progress Notes (Signed)
? ? ? ?Established Patient Office Visit ? ? ? ? ?This visit occurred during the SARS-CoV-2 public health emergency.  Safety protocols were in place, including screening questions prior to the visit, additional usage of staff PPE, and extensive cleaning of exam room while observing appropriate contact time as indicated for disinfecting solutions.  ? ? ?CC/Reason for Visit: 35-month follow-up chronic medical conditions ? ?HPI: Gary Clark is a 51 y.o. male who is coming in today for the above mentioned reasons. Past Medical History is significant for: Hypertension and hyperlipidemia.  He had been off medication for close to 2 years.  He restarted lisinopril and hydrochlorothiazide in January.  At that time his blood pressure was 200/142.  He is here today for follow-up.  He feels significant improvement.  At last visit he was also started on atorvastatin due to very elevated cholesterol level.  He has been tolerating well. ? ? ?Past Medical/Surgical History: ?Past Medical History:  ?Diagnosis Date  ? Allergy   ? Anxiety   ? Arthritis   ? Asthma   ? years ago  ? Back pain   ? Blood in stool   ? Cluster headache   ? Depression   ? Headache(784.0)   ? frequent   ? History of kidney stones   ? passed 2 times  ? Hyperlipidemia   ? Hypertension   ? Lumbosacral spondylosis without myelopathy 06/15/2013  ? Migraines   ?  10/27/2018- history of. Not current  ? Postlaminectomy syndrome, lumbar region 03/08/2015  ? Status post L4-L5 decompression and fusion, anterior/posterior in 2011   ? Pre-diabetes   ? pre diabetic per his PCP  ? Sleep apnea   ? tested in 2014 positive   ? Stroke [facility]) 2008  ? pt states "mini-Stroke"-   ? UTI (urinary tract infection)   ? ? ?Past Surgical History:  ?Procedure Laterality Date  ? ANTERIOR AND POSTERIOR SPINAL FUSION  2011  ? ANTERIOR LAT LUMBAR FUSION N/A 10/01/2016  ? Procedure: XLIF L3-4 ANTERIOR LATERAL LUMBAR FUSION 1 LEVEL;  Surgeon: Venita Lick, MD;  [facility]: [facility];  Service:  Orthopedics;  Laterality: N/A;  ? BACK SURGERY    ? HARDWARE REMOVAL N/A 10/01/2016  ? Procedure: Removal of pedical screws L4-5 HARDWARE REMOVAL;  Surgeon: Venita Lick, MD;  Location: [facility] OR;  Service: Orthopedics;  Laterality: N/A;  ? LUMBAR LAMINECTOMY/DECOMPRESSION MICRODISCECTOMY  2010  ? ? ?Social History: ? reports that he has never smoked. He has never been exposed to tobacco smoke. He has never used smokeless tobacco. He reports current alcohol use of about 10.0 standard drinks per week. He reports that he does not use drugs. ? ?Allergies: ?Allergies  ?Allergen Reactions  ? Codeine Other (See Comments)  ?  Gets "loopy"  ? ? ?Family History:  ?Family History  ?Problem Relation Age of Onset  ? Heart disease Mother   ? Hypertension Mother   ? Arthritis Father   ? Hypertension Father   ? Prostate cancer Father   ? Miscarriages / Stillbirths Maternal Grandmother   ? Heart disease Maternal Grandfather   ? Sudden death Maternal Grandfather   ? Breast cancer Sister   ? Migraines Sister   ? ? ? ?Current Outpatient Medications:  ?  atorvastatin (LIPITOR) 40 MG tablet, Take 1 tablet (40 mg total) by mouth daily., Disp: 90 tablet, Rfl: 1 ?  citalopram (CELEXA) 40 MG tablet, Take 1 tablet (40 mg total) by mouth daily., Disp: 90 tablet, Rfl: 1 ?  hydrochlorothiazide (HYDRODIURIL) 25 MG tablet, Take 1 tablet (25 mg total) by mouth daily., Disp: 90 tablet, Rfl: 1 ?  lisinopril (ZESTRIL) 40 MG tablet, Take 1 tablet (40 mg total) by mouth daily., Disp: 90 tablet, Rfl: 1 ?  sertraline (ZOLOFT) 25 MG tablet, Take 1 tablet (25 mg total) by mouth at bedtime., Disp: 90 tablet, Rfl: 1 ?  tadalafil (CIALIS) 20 MG tablet, Take 1 tablet (20 mg total) by mouth daily as needed for erectile dysfunction., Disp: 5 tablet, Rfl: 11 ? ?Review of Systems:  ?Constitutional: Denies fever, chills, diaphoresis, appetite change and fatigue.  ?HEENT: Denies photophobia, eye pain, redness, hearing loss, ear pain, congestion, sore throat, rhinorrhea,  sneezing, mouth sores, trouble swallowing, neck pain, neck stiffness and tinnitus.   ?Respiratory: Denies SOB, DOE, cough, chest tightness,  and wheezing.   ?Cardiovascular: Denies chest pain, palpitations and leg swelling.  ?Gastrointestinal: Denies nausea, vomiting, abdominal pain, diarrhea, constipation, blood in stool and abdominal distention.  ?Genitourinary: Denies dysuria, urgency, frequency, hematuria, flank pain and difficulty urinating.  ?Endocrine: Denies: hot or cold intolerance, sweats, changes in hair or nails, polyuria, polydipsia. ?Musculoskeletal: Denies myalgias, back pain, joint swelling, arthralgias and gait problem.  ?Skin: Denies pallor, rash and wound.  ?Neurological: Denies dizziness, seizures, syncope, weakness, light-headedness, numbness and headaches.  ?Hematological: Denies adenopathy. Easy bruising, personal or family bleeding history  ?Psychiatric/Behavioral: Denies suicidal ideation, mood changes, confusion, nervousness, sleep disturbance and agitation ? ? ? ?Physical Exam: ?Vitals:  ? 12/31/21 1025  ?BP: 130/90  ?Pulse: 89  ?Temp: 98.4 ?F (36.9 ?C)  ?TempSrc: Oral  ?SpO2: 99%  ?Weight: 227 lb 14.4 oz (103.4 kg)  ? ? ?Body mass index is 38.82 kg/m?. ? ? ?Constitutional: NAD, calm, comfortable, obese ?Eyes: PERRL, lids and conjunctivae normal ?ENMT: Mucous membranes are moist.  ?Respiratory: clear to auscultation bilaterally, no wheezing, no crackles. Normal respiratory effort. No accessory muscle use.  ?Cardiovascular: Regular rate and rhythm, no murmurs / rubs / gallops. No extremity edema.  ?Neurologic: Grossly intact and nonfocal ?Psychiatric: Normal judgment and insight. Alert and oriented x 3. Normal mood.  ? ? ?Impression and Plan: ? ?Essential hypertension  ?- Plan: hydrochlorothiazide (HYDRODIURIL) 25 MG tablet, lisinopril (ZESTRIL) 40 MG tablet, Comprehensive metabolic panel ?-Improved but not at goal. ?-Increase lisinopril to 40 mg, return in 3 months for follow-up. ? ?Mixed  hyperlipidemia  ?- Plan: atorvastatin (LIPITOR) 40 MG tablet, Lipid panel ? ?Major depressive disorder, recurrent, in remission (HCC)  ?- Plan: citalopram (CELEXA) 40 MG tablet, sertraline (ZOLOFT) 25 MG tablet ?Flowsheet Row Office Visit from 12/31/2021 in [facility] [place]  ?PHQ-9 Total Score 11  ? ?  ? ? ? ?Erectile dysfunction, unspecified erectile dysfunction type  ?- Plan: tadalafil (CIALIS) 20 MG tablet ? ? ? ?Time spent:30 minutes reviewing chart, interviewing and examining patient and formulating plan of care. ? ? ?Patient Instructions  ?-Nice seeing you today!!' ? ?-Lab work today; will notify you once results are available. ? ?-Increase lisinopril to 40 mg daily. ? ?-Schedule follow up in 3 months. ? ? ? ?Chaya Jan, MD ?[facility] ? ? ?

## 2022-01-05 ENCOUNTER — Telehealth: Payer: Self-pay | Admitting: Internal Medicine

## 2022-01-05 DIAGNOSIS — Z6836 Body mass index (BMI) 36.0-36.9, adult: Secondary | ICD-10-CM

## 2022-01-05 NOTE — Telephone Encounter (Signed)
Left message on machine for patient to return our call 

## 2022-01-05 NOTE — Telephone Encounter (Signed)
Patient called because he had a question about his weight loss. States that him and Dr.Hernandez spoke about it briefly and he just wanted a callback. Patient would not elaborate.  ? ? ? ? ? ? ?Good callback number is [phone] ? ? ? ? ? ? ?Please advise  ?

## 2022-01-06 NOTE — Telephone Encounter (Signed)
Patient is requesting medication or a referral for weight loss surgery. ? ?Please advise ?

## 2022-01-07 NOTE — Telephone Encounter (Signed)
Referral placed.

## 2022-01-09 ENCOUNTER — Other Ambulatory Visit: Payer: Medicare HMO

## 2022-01-12 ENCOUNTER — Telehealth: Payer: Self-pay | Admitting: Internal Medicine

## 2022-01-12 NOTE — Telephone Encounter (Signed)
Pt is calling and would like rachel to return his call and pt does not want to tell me the reason for the call ?

## 2022-01-12 NOTE — Telephone Encounter (Signed)
Referral placed to general surgery.

## 2022-01-12 NOTE — Telephone Encounter (Signed)
Left message on machine returning patient's call 

## 2022-02-02 ENCOUNTER — Telehealth: Payer: Medicare HMO

## 2022-02-14 ENCOUNTER — Ambulatory Visit (HOSPITAL_COMMUNITY)
Admission: EM | Admit: 2022-02-14 | Discharge: 2022-02-14 | Disposition: A | Discharge status: Home / Self Care | Payer: Medicare PPO | Attending: Family Medicine | Admitting: Family Medicine

## 2022-02-14 ENCOUNTER — Encounter (HOSPITAL_COMMUNITY): Payer: Self-pay | Admitting: Emergency Medicine

## 2022-02-14 ENCOUNTER — Other Ambulatory Visit: Payer: Self-pay

## 2022-02-14 DIAGNOSIS — J069 Acute upper respiratory infection, unspecified: Secondary | ICD-10-CM | POA: Diagnosis not present

## 2022-02-14 NOTE — ED Triage Notes (Signed)
Cough and sore throat started yesterday.  Congested chest, hoarse

## 2022-02-16 NOTE — ED Provider Notes (Signed)
[place]   [id number] 02/14/22 Arrival Time: [id number]  ASSESSMENT & PLAN:  1. Viral URI with cough    Discussed typical duration of viral illnesses. OTC symptom care as needed. May f/u here as needed.   Follow-up Information     Isaac Bliss, Rayford Halsted, MD.   Specialty: Internal Medicine Why: As needed. Contact information: [place] [address] [phone]                 Reviewed expectations re: course of current medical issues. Questions answered. Outlined signs and symptoms indicating need for more acute intervention. Understanding verbalized. After Visit Summary given.   SUBJECTIVE: History from: Patient. Gary Clark is a 51 y.o. male. Reports: ST and mild cough; abrupt onset yesterday; child with same. Denies: fever. Normal PO intake without n/v/d.  OBJECTIVE:  Vitals:   02/14/22 1713  BP: 123/83  Pulse: (!) 103  Resp: 18  Temp: 98.6 F (37 C)  TempSrc: Oral  SpO2: 95%    General appearance: alert; no distress Eyes: PERRLA; EOMI; conjunctiva normal HENT: [place]; AT; with nasal congestion; throat with mild cobblestoning Neck: supple  Lungs: speaks full sentences without difficulty; unlabored Extremities: no edema Skin: warm and dry Neurologic: normal gait Psychological: alert and cooperative; normal mood and affect   Allergies  Allergen Reactions   Codeine Other (See Comments)    Gets "loopy"    Past Medical History:  Diagnosis Date   Allergy    Anxiety    Arthritis    Asthma    years ago   Back pain    Blood in stool    Cluster headache    Depression    Headache(784.0)    frequent    History of kidney stones    passed 2 times   Hyperlipidemia    Hypertension    Lumbosacral spondylosis without myelopathy 06/15/2013   Migraines     10/27/2018- history of. Not current   Postlaminectomy syndrome, lumbar region 03/08/2015   Status post L4-L5 decompression and fusion, anterior/posterior in 2011     Pre-diabetes    pre diabetic per his PCP   Sleep apnea    tested in 2014 positive    Stroke [facility]) 2008   pt states "mini-Stroke"-    UTI (urinary tract infection)    Social History   Socioeconomic History   Marital status: Single    Spouse name: [name] Vickerman   Number of children: 1   Years of education: 12th   Highest education level: 12th grade  Occupational History    Comment: n/a  Tobacco Use   Smoking status: Never    Passive exposure: Never   Smokeless tobacco: Never  Vaping Use   Vaping Use: Never used  Substance and Sexual Activity   Alcohol use: Yes    Alcohol/week: 10.0 standard drinks    Types: 10 Cans of beer per week   Drug use: No   Sexual activity: Not Currently  Other Topics Concern   Not on file  Social History Narrative   Patient lives at home with his family.   Caffeine Use: 2 cups daily   Social Determinants of Health   Financial Resource Strain: Low Risk    Difficulty of Paying Living Expenses: Not hard at all  Food Insecurity: No Food Insecurity   Worried About Charity fundraiser in the Last Year: Never true   Ran Out of Food in the Last Year: Never true  Transportation  Needs: No Transportation Needs   Lack of Transportation (Medical): No   Lack of Transportation (Non-Medical): No  Physical Activity: Inactive   Days of Exercise per Week: 0 days   Minutes of Exercise per Session: 0 min  Stress: Stress Concern Present   Feeling of Stress : Very much  Social Connections: Moderately Integrated   Frequency of Communication with Friends and Family: More than three times a week   Frequency of Social Gatherings with Friends and Family: More than three times a week   Attends Religious Services: 1 to 4 times per year   Active Member of [employer] or Organizations: Yes   Attends Archivist Meetings: 1 to 4 times per year   Marital Status: Separated  Intimate Partner Violence: Not At Risk   Fear of Current or Ex-Partner: No   Emotionally  Abused: No   Physically Abused: No   Sexually Abused: No   Family History  Problem Relation Age of Onset   Heart disease Mother    Hypertension Mother    Arthritis Father    Hypertension Father    Prostate cancer Father    Miscarriages / Stillbirths Maternal Grandmother    Heart disease Maternal Grandfather    Sudden death Maternal Grandfather    Breast cancer Sister    Migraines Sister    Past Surgical History:  Procedure Laterality Date   ANTERIOR AND POSTERIOR SPINAL FUSION  2011   ANTERIOR LAT LUMBAR FUSION N/A 10/01/2016   Procedure: XLIF L3-4 ANTERIOR LATERAL LUMBAR FUSION 1 LEVEL;  Surgeon: Melina Schools, MD;  Location: [place];  Service: Orthopedics;  Laterality: N/A;   BACK SURGERY     HARDWARE REMOVAL N/A 10/01/2016   Procedure: Removal of pedical screws L4-5 HARDWARE REMOVAL;  Surgeon: Melina Schools, MD;  Location: [place];  Service: Orthopedics;  Laterality: N/A;   LUMBAR LAMINECTOMY/DECOMPRESSION MICRODISCECTOMY  2010     Vanessa Kick, MD 02/16/22 [phone]

## 2022-02-17 ENCOUNTER — Encounter: Payer: Self-pay | Admitting: Internal Medicine

## 2022-02-17 ENCOUNTER — Telehealth (INDEPENDENT_AMBULATORY_CARE_PROVIDER_SITE_OTHER): Payer: Medicare PPO | Admitting: Internal Medicine

## 2022-02-17 DIAGNOSIS — F3340 Major depressive disorder, recurrent, in remission, unspecified: Secondary | ICD-10-CM | POA: Diagnosis not present

## 2022-02-17 MED ORDER — CITALOPRAM HYDROBROMIDE 40 MG PO TABS: 40.0000 mg | ORAL_TABLET | Freq: Every day | ORAL | 1 refills | Status: DC

## 2022-02-17 NOTE — Progress Notes (Signed)
Virtual Visit via Video Note  I connected with Gary Clark on 02/17/22 at  3:30 PM EDT by a video enabled telemedicine application and verified that I am speaking with the correct person using two identifiers.  Location patient: home Location provider: work office Persons participating in the virtual visit: patient, provider  I discussed the limitations of evaluation and management by telemedicine and the availability of in person appointments. The patient expressed understanding and agreed to proceed.   HPI: He is calling for 2 reasons.  He needs a refill for Celexa and he also like to discuss weight loss.  He continues to work on lifestyle changes.  His weight today is 228 which is about the same as last visit.  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   ROS: Constitutional: Denies fever, chills, diaphoresis, appetite change and fatigue.  HEENT: Denies photophobia, eye pain, redness, hearing loss, ear pain, congestion, sore throat, rhinorrhea, sneezing, mouth sores, trouble swallowing, neck pain, neck stiffness and tinnitus.   Respiratory: Denies SOB, DOE, cough, chest tightness,  and wheezing.   Cardiovascular: Denies chest pain, palpitations and leg swelling.  Gastrointestinal: Denies nausea, vomiting, abdominal pain, diarrhea, constipation, blood in stool and abdominal distention.  Genitourinary: Denies dysuria, urgency, frequency, hematuria, flank pain and difficulty urinating.  Endocrine: Denies: hot or cold intolerance, sweats, changes in hair or nails, polyuria, polydipsia. Musculoskeletal: Denies myalgias, back pain, joint swelling, arthralgias and gait problem.  Skin: Denies pallor, rash and wound.  Neurological: Denies dizziness, seizures, syncope, weakness, light-headedness, numbness and headaches.  Hematological: Denies adenopathy. Easy bruising, personal or family  bleeding history  Psychiatric/Behavioral: Denies suicidal ideation, mood changes, confusion, nervousness, sleep disturbance and agitation   Past Medical History:  Diagnosis Date   Allergy    Anxiety    Arthritis    Asthma    years ago   Back pain    Blood in stool    Cluster headache    Depression    Headache(784.0)    frequent    History of kidney stones    passed 2 times   Hyperlipidemia    Hypertension    Lumbosacral spondylosis without myelopathy 06/15/2013   Migraines     10/27/2018- history of. Not current   Postlaminectomy syndrome, lumbar region 03/08/2015   Status post L4-L5 decompression and fusion, anterior/posterior in 2011    Pre-diabetes    pre diabetic per his PCP   Sleep apnea    tested in 2014 positive    Stroke [facility]) 2008   pt states "mini-Stroke"-    UTI (urinary tract infection)     Past Surgical History:  Procedure Laterality Date   ANTERIOR AND POSTERIOR SPINAL FUSION  2011   ANTERIOR LAT LUMBAR FUSION N/A 10/01/2016   Procedure: XLIF L3-4 ANTERIOR LATERAL LUMBAR FUSION 1 LEVEL;  Surgeon: Venita Lick, MD;  Location: [facility];  Service: Orthopedics;  Laterality: N/A;   BACK SURGERY     HARDWARE REMOVAL N/A 10/01/2016   Procedure: Removal of pedical screws L4-5 HARDWARE REMOVAL;  Surgeon: Venita Lick, MD;  Location: [facility];  Service: Orthopedics;  Laterality: N/A;   LUMBAR LAMINECTOMY/DECOMPRESSION MICRODISCECTOMY  2010    Family History  Problem Relation Age of Onset   Heart disease Mother    Hypertension Mother    Arthritis Father    Hypertension Father    Prostate cancer  Father    Miscarriages / Stillbirths Maternal Grandmother    Heart disease Maternal Grandfather    Sudden death Maternal Grandfather    Breast cancer Sister    Migraines Sister     SOCIAL HX:   reports that he has never smoked. He has never been exposed to tobacco smoke. He has never used smokeless tobacco. He reports current alcohol use of about 10.0 standard drinks per  week. He reports that he does not use drugs.   Current Outpatient Medications:    atorvastatin (LIPITOR) 40 MG tablet, Take 1 tablet (40 mg total) by mouth daily., Disp: 90 tablet, Rfl: 1   hydrochlorothiazide (HYDRODIURIL) 25 MG tablet, Take 1 tablet (25 mg total) by mouth daily., Disp: 90 tablet, Rfl: 1   lisinopril (ZESTRIL) 40 MG tablet, Take 1 tablet (40 mg total) by mouth daily., Disp: 90 tablet, Rfl: 1   sertraline (ZOLOFT) 25 MG tablet, Take 1 tablet (25 mg total) by mouth at bedtime., Disp: 90 tablet, Rfl: 1   tadalafil (CIALIS) 20 MG tablet, Take 1 tablet (20 mg total) by mouth daily as needed for erectile dysfunction., Disp: 5 tablet, Rfl: 11   citalopram (CELEXA) 40 MG tablet, Take 1 tablet (40 mg total) by mouth daily., Disp: 90 tablet, Rfl: 1  EXAM:   VITALS per patient if applicable: None reported  GENERAL: alert, oriented, appears well and in no acute distress  HEENT: atraumatic, conjunttiva clear, no obvious abnormalities on inspection of external nose and ears  NECK: normal movements of the head and neck  LUNGS: on inspection no signs of respiratory distress, breathing rate appears normal, no obvious gross increased work of breathing, gasping or wheezing  CV: no obvious cyanosis  MS: moves all visible extremities without noticeable abnormality  PSYCH/NEURO: pleasant and cooperative, no obvious depression or anxiety, speech and thought processing grossly intact  ASSESSMENT AND PLAN:   Morbid obesity (HCC) -Discussed healthy lifestyle, including increased physical activity and better food choices to promote weight loss. -We discussed that the only weight loss medication that I would recommend is a GLP-1/GIP.  He will call his insurance company to see if they will cover either Wegovy or Mounjaro and report back.  Major depressive disorder, recurrent, in remission (HCC)  - Plan: citalopram (CELEXA) 40 MG tablet     I discussed the assessment and treatment plan  with the patient. The patient was provided an opportunity to ask questions and all were answered. The patient agreed with the plan and demonstrated an understanding of the instructions.   The patient was advised to call back or seek an in-person evaluation if the symptoms worsen or if the condition fails to improve as anticipated.    Chaya Jan, MD  [facility]

## 2022-02-18 ENCOUNTER — Telehealth: Payer: Self-pay | Admitting: Internal Medicine

## 2022-02-18 NOTE — Telephone Encounter (Signed)
Pt requesting a substitution for Wegovy and mounjaro. States both are outside of his price range.

## 2022-02-18 NOTE — Telephone Encounter (Signed)
Patient is aware and referral placed 

## 2022-02-20 ENCOUNTER — Ambulatory Visit (INDEPENDENT_AMBULATORY_CARE_PROVIDER_SITE_OTHER): Payer: Medicare PPO

## 2022-02-20 DIAGNOSIS — I10 Essential (primary) hypertension: Secondary | ICD-10-CM

## 2022-02-20 DIAGNOSIS — E782 Mixed hyperlipidemia: Secondary | ICD-10-CM

## 2022-02-20 DIAGNOSIS — M545 Low back pain, unspecified: Secondary | ICD-10-CM

## 2022-02-20 DIAGNOSIS — Z6836 Body mass index (BMI) 36.0-36.9, adult: Secondary | ICD-10-CM

## 2022-02-20 NOTE — Patient Instructions (Signed)
Visit Information  Thank you for taking time to visit with me today. Please don't hesitate to contact me if I can be of assistance to you before our next scheduled telephone appointment.  Following are the goals we discussed today:  Take all medications as prescribed Attend all scheduled provider appointments Call pharmacy for medication refills 3-7 days in advance of running out of medications Perform all self care activities independently  Call provider office for new concerns or questions  Work with the social worker to address care coordination needs and will continue to work with the clinical team to address health care and disease management related needs check blood pressure 3 times per week choose a place to take my blood pressure (home, clinic or office, retail store) take blood pressure log to all doctor appointments call doctor for signs and symptoms of high blood pressure take medications for blood pressure exactly as prescribed eat more whole grains, fruits and vegetables, lean meats and healthy fats limit salt intake to 2300mg /day Contact insurance company to obtain B/P monitor through your over the counter benefit call for medicine refill 2 or 3 days before it runs out take all medications exactly as prescribed call doctor with any symptoms you believe are related to your medicine develop an exercise routine  Our next appointment is by telephone on 05/22/22 at 10 AM  Please call the care guide team at [phone] if you need to cancel or reschedule your appointment.   If you are experiencing a Mental Health or Behavioral Health Crisis or need someone to talk to, please call the Suicide and Crisis Lifeline: 988 call the 09-20-1986 National Suicide Prevention Lifeline: 6193464429 or TTY: 863-706-5604 TTY (702) 407-8855) to talk to a trained counselor call 1-800-273-TALK (toll free, 24 hour hotline) go to [facility] [address],  [place] [phone]) call 911   Patient verbalizes understanding of instructions and care plan provided today and agrees to view in MyChart. Active MyChart status and patient understanding of how to access instructions and care plan via MyChart confirmed with patient.     ([phone] RN, Dudley Major, CDE Care Management Coordinator [facility] [phone]

## 2022-02-20 NOTE — Chronic Care Management (AMB) (Signed)
Chronic Care Management   CCM RN Visit Note  02/20/2022 Name: Gary Clark MRN: [id number] DOB: Jul 16, 1971  Subjective: Gary Clark is a 51 y.o. year old male who is a primary care patient of Gary Clark, Gary Halsted, MD. The care management team was consulted for assistance with disease management and care coordination needs.    Engaged with patient by telephone for follow up visit in response to provider referral for case management and/or care coordination services.   Consent to Services:  The patient was given information about Chronic Care Management services, agreed to services, and gave verbal consent prior to initiation of services.  Please see initial visit note for detailed documentation.   Patient agreed to services and verbal consent obtained.   Assessment: Review of patient past medical history, allergies, medications, health status, including review of consultants reports, laboratory and other test data, was performed as part of comprehensive evaluation and provision of chronic care management services.   SDOH (Social Determinants of Health) assessments and interventions performed:    CCM Care Plan  Allergies  Allergen Reactions   Codeine Other (See Comments)    Gets "loopy"    Outpatient Encounter Medications as of 02/20/2022  Medication Sig   atorvastatin (LIPITOR) 40 MG tablet Take 1 tablet (40 mg total) by mouth daily.   citalopram (CELEXA) 40 MG tablet Take 1 tablet (40 mg total) by mouth daily.   hydrochlorothiazide (HYDRODIURIL) 25 MG tablet Take 1 tablet (25 mg total) by mouth daily.   lisinopril (ZESTRIL) 40 MG tablet Take 1 tablet (40 mg total) by mouth daily.   sertraline (ZOLOFT) 25 MG tablet Take 1 tablet (25 mg total) by mouth at bedtime.   tadalafil (CIALIS) 20 MG tablet Take 1 tablet (20 mg total) by mouth daily as needed for erectile dysfunction.   [DISCONTINUED] amitriptyline (ELAVIL) 25 MG tablet Take 1 tablet (25 mg total) by mouth at bedtime.    [DISCONTINUED] cetirizine (ZYRTEC) 10 MG tablet Take 1 tablet (10 mg total) by mouth daily.   [DISCONTINUED] fluticasone (FLONASE) 50 MCG/ACT nasal spray Place 2 sprays into both nostrils daily.   [DISCONTINUED] ipratropium (ATROVENT) 0.06 % nasal spray Place 2 sprays into both nostrils 4 (four) times daily.   [DISCONTINUED] rosuvastatin (CRESTOR) 40 MG tablet Take 1 tablet (40 mg total) by mouth daily.   No facility-administered encounter medications on file as of 02/20/2022.    Patient Active Problem List   Diagnosis Date Noted   S/P lumbar fusion 11/04/2018   Degeneration of lumbar intervertebral disc 10/27/2017   History of lumbar fusion 10/27/2017   Back pain 10/01/2016   Lumbar radiculitis 07/17/2016   Lumbosacral radiculitis 05/19/2016   Patellofemoral syndrome of both knees 05/19/2016   Sacroiliac joint dysfunction of both sides 02/04/2016   Obesity 06/03/2015   Mild intermittent asthma 06/03/2015   Major depressive disorder, recurrent, in remission ([place]) 06/03/2015   Postlaminectomy syndrome, lumbar region 03/08/2015   Migraine/Cluster HAs 10/14/2012   Chronic low back pain - hx spinal stenosis, decompression surgery in 2010, 2011 10/14/2012   Hyperlipemia 10/14/2012   Hypertension 10/14/2012    Conditions to be addressed/monitored:HTN, HLD, Depression, and chronic pain  Care Plan : RN Care Manager Plan of Care  Updates made by Dimitri Ped, RN since 02/20/2022 12:00 AM     Problem: Chronic Disease Management and Care Coordination Needs (HTN, HLD,chronic pain depression)   Priority: High     Long-Range Goal: Establish Plan of Care for Chronic Disease  Management Needs(HTN, HLD,chronic pain, depression)   Start Date: 07/07/2021  Expected End Date: 01/03/2022  Recent Progress: On track  Priority: High  Note:   Current Barriers:  Care Coordination needs related to Mental Health Concerns  and caregiver stress Chronic Disease Management support and education needs  related to HTN, HLD, and Depression chronic pain Pt states that he has a lot of stress, anxiety and some depression as he has to care for his 29 yr old son with autism and his elderly parents are now living with him.  He is in the process of getting divorced.   States he is feeling better and he is now taking his medications as ordered. States he has been checking his B/P at [facility] or at the drug store and it was 128/86 this morning   States he is trying to eat healthier and snacking less  States his caregiver stress is about the same but he is dealing with it well currently.  States his back pain is about the same.  States he has signed up for [employer] and he plans to go work out in the evening after his son has gone to bed and his parents are there if needed. STates he is still thinking about going to the weight loss clinic but thinks he could not be able to have surgery because of his care giving duties for his son.  RNCM Clinical Goal(s):  Patient will verbalize understanding of plan for management of HTN, HLD, Depression, and chronic pain as evidenced by voiced adherence to plan of care verbalize basic understanding of  HTN, HLD, Depression, and chronic pain disease process and self health management plan as evidenced by voiced understanding take all medications exactly as prescribed and will call provider for medication related questions as evidenced by dispense report and pt verbalization attend all scheduled medical appointments: Dr. Jerilee Hoh 04/02/22 as evidenced by medical records demonstrate Improved adherence to prescribed treatment plan for HTN, HLD, Depression, and Chronic pain as evidenced by readings within limits, voiced adherence to plan of care continue to work with RN Care Manager to address care management and care coordination needs related to  HTN, HLD, Depression, and chronic pain as evidenced by adherence to CM Team Scheduled appointments work with Education officer, museum to address   related to the management of Mental Health Concerns  and caregiver stress related to the management of HTN, HLD, Depression, and chronic pain as evidenced by review of EMR and patient or Education officer, museum report through collaboration with Consulting civil engineer, provider, and care team.   Interventions: 1:1 collaboration with primary care provider regarding development and update of comprehensive plan of care as evidenced by provider attestation and co-signature Inter-disciplinary care team collaboration (see longitudinal plan of care) Evaluation of current treatment plan related to  self management and patient's adherence to plan as established by provider    Hyperlipidemia Interventions:  (Status:  Goal on track:  Yes.) Long Term Goal Medication review performed; medication list updated in electronic medical record.  Provider established cholesterol goals reviewed Counseled on importance of regular laboratory monitoring as prescribed Reviewed role and benefits of statin for ASCVD risk reduction Reviewed importance of limiting foods high in cholesterol Reviewed exercise goals and target of 150 minutes per week Praised for the improvement in his lipids.  Reviewed to continue to take his medication and watch is diet  Hypertension Interventions:  (Status:  Goal on track:  Yes.) Long Term Goal Last practice recorded BP readings:  BP Readings from Last 3 Encounters:  02/14/22 123/83  12/31/21 130/90  10/07/21 (!) 200/142  Most recent eGFR/CrCl: No results found for: EGFR  No components found for: CRCL  Evaluation of current treatment plan related to hypertension self management and patient's adherence to plan as established by provider Provided education to patient re: stroke prevention, s/s of heart attack and stroke Reviewed medications with patient and discussed importance of compliance Provided assistance with obtaining home blood pressure monitor via over the counter benefit from  insurance; Counseled on the importance of exercise goals with target of 150 minutes per week Discussed plans with patient for ongoing care management follow up and provided patient with direct contact information for care management team Advised patient, providing education and rationale, to monitor blood pressure daily and record, calling PCP for findings outside established parameters Provided education on prescribed diet low sodium low fat Discussed complications of poorly controlled blood pressure such as heart disease, stroke, circulatory complications, vision complications, kidney impairment, sexual dysfunction Reinforced to make  healthier snack choices.  Reinforced to try protein drinks if he is unable to prepare something to eat. Reviewed how exercise and weight loss will help with his B/P  Pain Interventions:  (Status:  Goal on track:  Yes.) Long Term Goal Pain assessment performed Medications reviewed Reviewed provider established plan for pain management Discussed importance of adherence to all scheduled medical appointments Counseled on the importance of reporting any/all new or changed pain symptoms or management strategies to pain management provider Advised patient to report to care team affect of pain on daily activities Discussed use of relaxation techniques and/or diversional activities to assist with pain reduction (distraction, imagery, relaxation, massage, acupressure, TENS, heat, and cold application Reviewed with patient prescribed pharmacological and nonpharmacological pain relief strategies     Patient Goals/Self-Care Activities: Take all medications as prescribed Attend all scheduled provider appointments Call pharmacy for medication refills 3-7 days in advance of running out of medications Perform all self care activities independently  Call provider office for new concerns or questions  Work with the social worker to address care coordination needs and will  continue to work with the clinical team to address health care and disease management related needs check blood pressure 3 times per week choose a place to take my blood pressure (home, clinic or office, retail store) take blood pressure log to all doctor appointments call doctor for signs and symptoms of high blood pressure take medications for blood pressure exactly as prescribed eat more whole grains, fruits and vegetables, lean meats and healthy fats limit salt intake to $RemoveB'2300mg'SrJHpfcM$ /day Contact insurance company to obtain B/P monitor through your over the counter benefit call for medicine refill 2 or 3 days before it runs out take all medications exactly as prescribed call doctor with any symptoms you believe are related to your medicine develop an exercise routine  Follow Up Plan:  Telephone follow up appointment with care management team member scheduled for:  05/22/22 The patient has been provided with contact information for the care management team and has been advised to call with any health related questions or concerns.       Plan:Telephone follow up appointment with care management team member scheduled for:  05/22/22 The patient has been provided with contact information for the care management team and has been advised to call with any health related questions or concerns.  Peter Garter RN, Jackquline Denmark, CDE Care Management Coordinator [facility] [phone]

## 2022-02-24 ENCOUNTER — Telehealth: Payer: Medicare PPO | Admitting: Internal Medicine

## 2022-02-24 ENCOUNTER — Telehealth: Payer: Medicare PPO | Admitting: Family Medicine

## 2022-02-24 ENCOUNTER — Telehealth (INDEPENDENT_AMBULATORY_CARE_PROVIDER_SITE_OTHER): Payer: Medicare PPO | Admitting: Family Medicine

## 2022-02-24 DIAGNOSIS — R059 Cough, unspecified: Secondary | ICD-10-CM

## 2022-02-24 DIAGNOSIS — R0981 Nasal congestion: Secondary | ICD-10-CM | POA: Diagnosis not present

## 2022-02-24 MED ORDER — AMOXICILLIN-POT CLAVULANATE 875-125 MG PO TABS: 1.0000 | ORAL_TABLET | Freq: Two times a day (BID) | ORAL | 0 refills | Status: DC

## 2022-02-24 MED ORDER — BENZONATATE 100 MG PO CAPS: ORAL_CAPSULE | ORAL | 0 refills | Status: DC

## 2022-02-24 NOTE — Patient Instructions (Addendum)
-  I sent the medication(s) we discussed to your pharmacy: Meds ordered this encounter  Medications   benzonatate (TESSALON PERLES) 100 MG capsule    Sig: 1-2 capsules up to twice daily as needed for cough.    Dispense:  30 capsule    Refill:  0   amoxicillin-clavulanate (AUGMENTIN) 875-125 MG tablet    Sig: Take 1 tablet by mouth 2 (two) times daily.    Dispense:  20 tablet    Refill:  0    Nasal saline twice daily.   I hope you are feeling better soon!  Seek in person care promptly if your symptoms worsen, new concerns arise or you are not improving with treatment.  It was nice to meet you today. I help [facility] out with telemedicine visits on Tuesdays and Thursdays and am happy to help if you need a virtual follow up visit on those days. Otherwise, if you have any concerns or questions following this visit please schedule a follow up visit with your Primary Care office or seek care at a local urgent care clinic to avoid delays in care. If you are having severe or life threatening symptoms please call 911 and/or go to the nearest emergency room.

## 2022-02-24 NOTE — Progress Notes (Signed)
Virtual Visit via Video Note  I connected with Gary Clark  on 02/24/22 at 12:20 PM EDT by a video enabled telemedicine application and verified that I am speaking with the correct person using two identifiers.  Location patient: [place] Location provider:work or home office Persons participating in the virtual visit: patient, provider  I discussed the limitations and requested verbal permission for telemedicine visit. The patient expressed understanding and agreed to proceed.   HPI:  Acute telemedicine visit for : -Onset: onset 11-12 days ago, worsening -he went to [facility] on 6/3 and was told this was viral so has been trying OTC meds and his allergy pill -Symptoms include: bad cough, drainage, mucus is "nasty" yellow, nasal congestion -Denies: fever, CP, SOB, NVD, asthma symptoms other than cough -Has tried: musinex, allegra -Pertinent past medical history: see below -Pertinent medication allergies: Allergies  Allergen Reactions   Codeine Other (See Comments)    Gets "loopy"  -COVID-19 vaccine status:  Immunization History  Administered Date(s) Administered   Influenza Split 06/10/2012   Influenza,inj,Quad PF,6+ Mos 06/12/2013, 06/03/2015, 06/18/2016, 07/29/2017, 10/06/2018   Moderna Sars-Covid-2 Vaccination 06/13/2020, 07/11/2020   Tdap 01/01/2015     ROS: See pertinent positives and negatives per HPI.  Past Medical History:  Diagnosis Date   Allergy    Anxiety    Arthritis    Asthma    years ago   Back pain    Blood in stool    Cluster headache    Depression    Headache(784.0)    frequent    History of kidney stones    passed 2 times   Hyperlipidemia    Hypertension    Lumbosacral spondylosis without myelopathy 06/15/2013   Migraines     10/27/2018- history of. Not current   Postlaminectomy syndrome, lumbar region 03/08/2015   Status post L4-L5 decompression and fusion, anterior/posterior in 2011    Pre-diabetes    pre diabetic per his PCP   Sleep apnea    tested in  2014 positive    Stroke [facility]) 2008   pt states "mini-Stroke"-    UTI (urinary tract infection)     Past Surgical History:  Procedure Laterality Date   ANTERIOR AND POSTERIOR SPINAL FUSION  2011   ANTERIOR LAT LUMBAR FUSION N/A 10/01/2016   Procedure: XLIF L3-4 ANTERIOR LATERAL LUMBAR FUSION 1 LEVEL;  Surgeon: Venita Lick, MD;  Location: [facility] OR;  Service: Orthopedics;  Laterality: N/A;   BACK SURGERY     HARDWARE REMOVAL N/A 10/01/2016   Procedure: Removal of pedical screws L4-5 HARDWARE REMOVAL;  Surgeon: Venita Lick, MD;  Location: [facility] OR;  Service: Orthopedics;  Laterality: N/A;   LUMBAR LAMINECTOMY/DECOMPRESSION MICRODISCECTOMY  2010     Current Outpatient Medications:    amoxicillin-clavulanate (AUGMENTIN) 875-125 MG tablet, Take 1 tablet by mouth 2 (two) times daily., Disp: 20 tablet, Rfl: 0   benzonatate (TESSALON PERLES) 100 MG capsule, 1-2 capsules up to twice daily as needed for cough., Disp: 30 capsule, Rfl: 0   atorvastatin (LIPITOR) 40 MG tablet, Take 1 tablet (40 mg total) by mouth daily., Disp: 90 tablet, Rfl: 1   citalopram (CELEXA) 40 MG tablet, Take 1 tablet (40 mg total) by mouth daily., Disp: 90 tablet, Rfl: 1   hydrochlorothiazide (HYDRODIURIL) 25 MG tablet, Take 1 tablet (25 mg total) by mouth daily., Disp: 90 tablet, Rfl: 1   lisinopril (ZESTRIL) 40 MG tablet, Take 1 tablet (40 mg total) by mouth daily., Disp: 90 tablet, Rfl: 1   sertraline (ZOLOFT)  25 MG tablet, Take 1 tablet (25 mg total) by mouth at bedtime., Disp: 90 tablet, Rfl: 1   tadalafil (CIALIS) 20 MG tablet, Take 1 tablet (20 mg total) by mouth daily as needed for erectile dysfunction., Disp: 5 tablet, Rfl: 11  EXAM:  VITALS per patient if applicable:  GENERAL: alert, oriented, appears well and in no acute distress  HEENT: atraumatic, conjunttiva clear, no obvious abnormalities on inspection of external nose and ears  NECK: normal movements of the head and neck  LUNGS: on inspection no signs of  respiratory distress, breathing rate appears normal, no obvious gross SOB, gasping or wheezing  CV: no obvious cyanosis  MS: moves all visible extremities without noticeable abnormality  PSYCH/NEURO: pleasant and cooperative, no obvious depression or anxiety, speech and thought processing grossly intact  ASSESSMENT AND PLAN:  Discussed the following assessment and plan:  Cough, unspecified type  Nasal sinus congestion  -we discussed possible serious and likely etiologies, options for evaluation and workup, limitations of telemedicine visit vs in person visit, treatment, treatment risks and precautions. Pt is agreeable to treatment via telemedicine at this moment. Query developing bacterial resp inf vs ongoing symptoms with viral illness, allergies. He has opted to try nasal saline rinses, Tessalon rx for cough and initiation of Aug 875 bid x 10 days if worsening or not improving.   Advised to seek prompt virtual visit or in person care if worsening, new symptoms arise, or if is not improving with treatment as expected per our conversation of expected course.  I discussed the assessment and treatment plan with the patient. The patient was provided an opportunity to ask questions and all were answered. The patient agreed with the plan and demonstrated an understanding of the instructions.     Terressa Koyanagi, DO

## 2022-03-10 ENCOUNTER — Ambulatory Visit: Payer: Self-pay

## 2022-03-10 DIAGNOSIS — F338 Other recurrent depressive disorders: Secondary | ICD-10-CM | POA: Diagnosis not present

## 2022-03-10 DIAGNOSIS — F59 Unspecified behavioral syndromes associated with physiological disturbances and physical factors: Secondary | ICD-10-CM | POA: Diagnosis not present

## 2022-03-10 DIAGNOSIS — E782 Mixed hyperlipidemia: Secondary | ICD-10-CM

## 2022-03-10 DIAGNOSIS — Z713 Dietary counseling and surveillance: Secondary | ICD-10-CM | POA: Diagnosis not present

## 2022-03-10 DIAGNOSIS — E669 Obesity, unspecified: Secondary | ICD-10-CM | POA: Diagnosis not present

## 2022-03-10 DIAGNOSIS — F509 Eating disorder, unspecified: Secondary | ICD-10-CM | POA: Diagnosis not present

## 2022-03-10 DIAGNOSIS — I10 Essential (primary) hypertension: Secondary | ICD-10-CM

## 2022-03-10 DIAGNOSIS — F5109 Other insomnia not due to a substance or known physiological condition: Secondary | ICD-10-CM | POA: Diagnosis not present

## 2022-03-10 DIAGNOSIS — F1010 Alcohol abuse, uncomplicated: Secondary | ICD-10-CM | POA: Diagnosis not present

## 2022-03-10 NOTE — Chronic Care Management (AMB) (Signed)
Chronic Care Management   CCM RN Visit Note  03/10/2022 Name: Gary Clark MRN: [id number] DOB: Mar 20, 1971  Subjective: Gary Clark is a 51 y.o. year old male who is a primary care patient of Philip Aspen, Limmie Patricia, MD. The care management team was consulted for assistance with disease management and care coordination needs.    Engaged with patient by telephone for follow up visit in response to provider referral for case management and/or care coordination services.   Consent to Services:  The patient was given information about Chronic Care Management services, agreed to services, and gave verbal consent prior to initiation of services.  Please see initial visit note for detailed documentation.   Patient agreed to services and verbal consent obtained.   Assessment: Review of patient past medical history, allergies, medications, health status, including review of consultants reports, laboratory and other test data, was performed as part of comprehensive evaluation and provision of chronic care management services.   SDOH (Social Determinants of Health) assessments and interventions performed:    CCM Care Plan  Allergies  Allergen Reactions   Codeine Other (See Comments)    Gets "loopy"    Outpatient Encounter Medications as of 03/10/2022  Medication Sig   amoxicillin-clavulanate (AUGMENTIN) 875-125 MG tablet Take 1 tablet by mouth 2 (two) times daily.   atorvastatin (LIPITOR) 40 MG tablet Take 1 tablet (40 mg total) by mouth daily.   benzonatate (TESSALON PERLES) 100 MG capsule 1-2 capsules up to twice daily as needed for cough.   citalopram (CELEXA) 40 MG tablet Take 1 tablet (40 mg total) by mouth daily.   hydrochlorothiazide (HYDRODIURIL) 25 MG tablet Take 1 tablet (25 mg total) by mouth daily.   lisinopril (ZESTRIL) 40 MG tablet Take 1 tablet (40 mg total) by mouth daily.   sertraline (ZOLOFT) 25 MG tablet Take 1 tablet (25 mg total) by mouth at bedtime.   tadalafil  (CIALIS) 20 MG tablet Take 1 tablet (20 mg total) by mouth daily as needed for erectile dysfunction.   [DISCONTINUED] amitriptyline (ELAVIL) 25 MG tablet Take 1 tablet (25 mg total) by mouth at bedtime.   [DISCONTINUED] cetirizine (ZYRTEC) 10 MG tablet Take 1 tablet (10 mg total) by mouth daily.   [DISCONTINUED] fluticasone (FLONASE) 50 MCG/ACT nasal spray Place 2 sprays into both nostrils daily.   [DISCONTINUED] ipratropium (ATROVENT) 0.06 % nasal spray Place 2 sprays into both nostrils 4 (four) times daily.   [DISCONTINUED] rosuvastatin (CRESTOR) 40 MG tablet Take 1 tablet (40 mg total) by mouth daily.   No facility-administered encounter medications on file as of 03/10/2022.    Patient Active Problem List   Diagnosis Date Noted   S/P lumbar fusion 11/04/2018   Degeneration of lumbar intervertebral disc 10/27/2017   History of lumbar fusion 10/27/2017   Back pain 10/01/2016   Lumbar radiculitis 07/17/2016   Lumbosacral radiculitis 05/19/2016   Patellofemoral syndrome of both knees 05/19/2016   Sacroiliac joint dysfunction of both sides 02/04/2016   Obesity 06/03/2015   Mild intermittent asthma 06/03/2015   Major depressive disorder, recurrent, in remission (HCC) 06/03/2015   Postlaminectomy syndrome, lumbar region 03/08/2015   Migraine/Cluster HAs 10/14/2012   Chronic low back pain - hx spinal stenosis, decompression surgery in 2010, 2011 10/14/2012   Hyperlipemia 10/14/2012   Hypertension 10/14/2012    Conditions to be addressed/monitored:HTN and HLD  Care Plan : RN Care Manager Plan of Care  Updates made by Yetta Glassman, RN since 03/10/2022 12:00 AM  Completed  03/10/2022   Problem: Chronic Disease Management and Care Coordination Needs (HTN, HLD,chronic pain depression) Resolved 03/10/2022  Priority: High     Long-Range Goal: Establish Plan of Care for Chronic Disease Management Needs(HTN, HLD,chronic pain, depression) Completed 03/10/2022  Start Date: 07/07/2021   Expected End Date: 01/03/2022  Recent Progress: On track  Priority: High  Note:   Case closed goals met Current Barriers:  Care Coordination needs related to Mental Health Concerns  and caregiver stress Chronic Disease Management support and education needs related to HTN, HLD, and Depression chronic pain Pt states that he has a lot of stress, anxiety and some depression as he has to care for his 87 yr old son with autism and his elderly parents are now living with him.  He is in the process of getting divorced.   States he is feeling better and he is now taking his medications as ordered. States he has been checking his B/P at [facility] or at the drug store and it was 128/86 this morning   States he is trying to eat healthier and snacking less  States his caregiver stress is about the same but he is dealing with it well currently.  States his back pain is about the same.  States he has signed up for [employer] and he plans to go work out in the evening after his son has gone to bed and his parents are there if needed. STates he is still thinking about going to the weight loss clinic but thinks he could not be able to have surgery because of his care giving duties for his son.  RNCM Clinical Goal(s):  Patient will verbalize understanding of plan for management of HTN, HLD, Depression, and chronic pain as evidenced by voiced adherence to plan of care verbalize basic understanding of  HTN, HLD, Depression, and chronic pain disease process and self health management plan as evidenced by voiced understanding take all medications exactly as prescribed and will call provider for medication related questions as evidenced by dispense report and pt verbalization attend all scheduled medical appointments: Dr. Ardyth Harps 04/02/22 as evidenced by medical records demonstrate Improved adherence to prescribed treatment plan for HTN, HLD, Depression, and Chronic pain as evidenced by readings within limits, voiced  adherence to plan of care continue to work with RN Care Manager to address care management and care coordination needs related to  HTN, HLD, Depression, and chronic pain as evidenced by adherence to CM Team Scheduled appointments work with Child psychotherapist to address  related to the management of Mental Health Concerns  and caregiver stress related to the management of HTN, HLD, Depression, and chronic pain as evidenced by review of EMR and patient or Child psychotherapist report through collaboration with Medical illustrator, provider, and care team.   Interventions: 1:1 collaboration with primary care provider regarding development and update of comprehensive plan of care as evidenced by provider attestation and co-signature Inter-disciplinary care team collaboration (see longitudinal plan of care) Evaluation of current treatment plan related to  self management and patient's adherence to plan as established by provider    Hyperlipidemia Interventions:  (Status:  Goal Met.) Long Term Goal Medication review performed; medication list updated in electronic medical record.  Provider established cholesterol goals reviewed Counseled on importance of regular laboratory monitoring as prescribed Reviewed role and benefits of statin for ASCVD risk reduction Reviewed importance of limiting foods high in cholesterol Reviewed exercise goals and target of 150 minutes per week Praised for the improvement  in his lipids.  Reviewed to continue to take his medication and watch is diet  Hypertension Interventions:  (Status:  Goal Met.) Long Term Goal Last practice recorded BP readings:  BP Readings from Last 3 Encounters:  02/14/22 123/83  12/31/21 130/90  10/07/21 (!) 200/142  Most recent eGFR/CrCl: No results found for: EGFR  No components found for: CRCL  Evaluation of current treatment plan related to hypertension self management and patient's adherence to plan as established by provider Provided education to  patient re: stroke prevention, s/s of heart attack and stroke Reviewed medications with patient and discussed importance of compliance Provided assistance with obtaining home blood pressure monitor via over the counter benefit from insurance; Counseled on the importance of exercise goals with target of 150 minutes per week Discussed plans with patient for ongoing care management follow up and provided patient with direct contact information for care management team Advised patient, providing education and rationale, to monitor blood pressure daily and record, calling PCP for findings outside established parameters Provided education on prescribed diet low sodium low fat Discussed complications of poorly controlled blood pressure such as heart disease, stroke, circulatory complications, vision complications, kidney impairment, sexual dysfunction Reinforced to make  healthier snack choices.  Reinforced to try protein drinks if he is unable to prepare something to eat. Reviewed how exercise and weight loss will help with his B/P  Pain Interventions:  (Status:  Goal Met.) Long Term Goal Pain assessment performed Medications reviewed Reviewed provider established plan for pain management Discussed importance of adherence to all scheduled medical appointments Counseled on the importance of reporting any/all new or changed pain symptoms or management strategies to pain management provider Advised patient to report to care team affect of pain on daily activities Discussed use of relaxation techniques and/or diversional activities to assist with pain reduction (distraction, imagery, relaxation, massage, acupressure, TENS, heat, and cold application Reviewed with patient prescribed pharmacological and nonpharmacological pain relief strategies     Patient Goals/Self-Care Activities: Take all medications as prescribed Attend all scheduled provider appointments Call pharmacy for medication refills 3-7  days in advance of running out of medications Perform all self care activities independently  Call provider office for new concerns or questions  Work with the social worker to address care coordination needs and will continue to work with the clinical team to address health care and disease management related needs check blood pressure 3 times per week choose a place to take my blood pressure (home, clinic or office, retail store) take blood pressure log to all doctor appointments call doctor for signs and symptoms of high blood pressure take medications for blood pressure exactly as prescribed eat more whole grains, fruits and vegetables, lean meats and healthy fats limit salt intake to 2300mg /day Contact insurance company to obtain B/P monitor through your over the counter benefit call for medicine refill 2 or 3 days before it runs out take all medications exactly as prescribed call doctor with any symptoms you believe are related to your medicine develop an exercise routine  Follow Up Plan:  The patient has been provided with contact information for the care management team and has been advised to call with any health related questions or concerns.  No further follow up required: Case closed goals met       Plan:The patient has been provided with contact information for the care management team and has been advised to call with any health related questions or concerns.  No further follow up  required: Case closed goals met Dudley Major RN, [facility], CDE Care Management Coordinator [facility] [phone]

## 2022-03-13 DIAGNOSIS — I10 Essential (primary) hypertension: Secondary | ICD-10-CM

## 2022-03-13 DIAGNOSIS — E785 Hyperlipidemia, unspecified: Secondary | ICD-10-CM | POA: Diagnosis not present

## 2022-03-19 DIAGNOSIS — E669 Obesity, unspecified: Secondary | ICD-10-CM | POA: Diagnosis not present

## 2022-03-19 DIAGNOSIS — F1010 Alcohol abuse, uncomplicated: Secondary | ICD-10-CM | POA: Diagnosis not present

## 2022-03-19 DIAGNOSIS — F5109 Other insomnia not due to a substance or known physiological condition: Secondary | ICD-10-CM | POA: Diagnosis not present

## 2022-03-19 DIAGNOSIS — F59 Unspecified behavioral syndromes associated with physiological disturbances and physical factors: Secondary | ICD-10-CM | POA: Diagnosis not present

## 2022-03-19 DIAGNOSIS — F509 Eating disorder, unspecified: Secondary | ICD-10-CM | POA: Diagnosis not present

## 2022-03-19 DIAGNOSIS — F338 Other recurrent depressive disorders: Secondary | ICD-10-CM | POA: Diagnosis not present

## 2022-03-19 DIAGNOSIS — Z713 Dietary counseling and surveillance: Secondary | ICD-10-CM | POA: Diagnosis not present

## 2022-04-02 ENCOUNTER — Ambulatory Visit: Payer: Medicare PPO | Admitting: Internal Medicine

## 2022-05-22 ENCOUNTER — Telehealth: Payer: Medicare PPO

## 2022-06-19 ENCOUNTER — Other Ambulatory Visit: Payer: Self-pay | Admitting: Internal Medicine

## 2022-06-19 DIAGNOSIS — I10 Essential (primary) hypertension: Secondary | ICD-10-CM

## 2022-06-29 ENCOUNTER — Ambulatory Visit: Payer: Medicare PPO | Admitting: Internal Medicine

## 2022-06-30 ENCOUNTER — Encounter: Payer: Self-pay | Admitting: Internal Medicine

## 2022-06-30 ENCOUNTER — Ambulatory Visit (INDEPENDENT_AMBULATORY_CARE_PROVIDER_SITE_OTHER): Payer: Medicare PPO | Admitting: Internal Medicine

## 2022-06-30 VITALS — BP 164/117 | HR 80 | Temp 98.3°F | Wt 229.9 lb

## 2022-06-30 DIAGNOSIS — N529 Male erectile dysfunction, unspecified: Secondary | ICD-10-CM

## 2022-06-30 DIAGNOSIS — Z23 Encounter for immunization: Secondary | ICD-10-CM

## 2022-06-30 DIAGNOSIS — E782 Mixed hyperlipidemia: Secondary | ICD-10-CM

## 2022-06-30 DIAGNOSIS — F3340 Major depressive disorder, recurrent, in remission, unspecified: Secondary | ICD-10-CM | POA: Diagnosis not present

## 2022-06-30 DIAGNOSIS — I10 Essential (primary) hypertension: Secondary | ICD-10-CM

## 2022-06-30 LAB — CBC WITH DIFFERENTIAL/PLATELET
Basophils Absolute: 0.1 10*3/uL (ref 0.0–0.1)
Basophils Relative: 0.8 % (ref 0.0–3.0)
Eosinophils Absolute: 0.3 10*3/uL (ref 0.0–0.7)
Eosinophils Relative: 4.4 % (ref 0.0–5.0)
HCT: 41 % (ref 39.0–52.0)
Hemoglobin: 13.9 g/dL (ref 13.0–17.0)
Lymphocytes Relative: 31.4 % (ref 12.0–46.0)
Lymphs Abs: 2.1 10*3/uL (ref 0.7–4.0)
MCHC: 34.1 g/dL (ref 30.0–36.0)
MCV: 97.7 fl (ref 78.0–100.0)
Monocytes Absolute: 0.5 10*3/uL (ref 0.1–1.0)
Monocytes Relative: 7.6 % (ref 3.0–12.0)
Neutro Abs: 3.8 10*3/uL (ref 1.4–7.7)
Neutrophils Relative %: 55.8 % (ref 43.0–77.0)
Platelets: 267 10*3/uL (ref 150.0–400.0)
RBC: 4.19 Mil/uL — ABNORMAL LOW (ref 4.22–5.81)
RDW: 12.5 % (ref 11.5–15.5)
WBC: 6.8 10*3/uL (ref 4.0–10.5)

## 2022-06-30 LAB — HEMOGLOBIN A1C: Hgb A1c MFr Bld: 6.4 % (ref 4.6–6.5)

## 2022-06-30 LAB — LIPID PANEL
Cholesterol: 259 mg/dL — ABNORMAL HIGH (ref 0–200)
HDL: 48.7 mg/dL (ref >39.00)
Total CHOL/HDL Ratio: 5
Triglycerides: 405 mg/dL — ABNORMAL HIGH (ref 0.0–149.0)

## 2022-06-30 LAB — LDL CHOLESTEROL, DIRECT: Direct LDL: 169 mg/dL

## 2022-06-30 LAB — COMPREHENSIVE METABOLIC PANEL
ALT: 27 U/L (ref 0–53)
AST: 17 U/L (ref 0–37)
Albumin: 4.6 g/dL (ref 3.5–5.2)
Alkaline Phosphatase: 53 U/L (ref 39–117)
BUN: 17 mg/dL (ref 6–23)
CO2: 27 mEq/L (ref 19–32)
Calcium: 10.2 mg/dL (ref 8.4–10.5)
Chloride: 103 mEq/L (ref 96–112)
Creatinine, Ser: 0.92 mg/dL (ref 0.40–1.50)
GFR: 95.72 mL/min (ref >60.00)
Glucose, Bld: 104 mg/dL — ABNORMAL HIGH (ref 70–99)
Potassium: 3.9 mEq/L (ref 3.5–5.1)
Sodium: 138 mEq/L (ref 135–145)
Total Bilirubin: 0.4 mg/dL (ref 0.2–1.2)
Total Protein: 7.2 g/dL (ref 6.0–8.3)

## 2022-06-30 MED ORDER — LISINOPRIL 40 MG PO TABS: 40.0000 mg | ORAL_TABLET | Freq: Every day | ORAL | 1 refills | Status: DC

## 2022-06-30 MED ORDER — CITALOPRAM HYDROBROMIDE 40 MG PO TABS: 40.0000 mg | ORAL_TABLET | Freq: Every day | ORAL | 1 refills | Status: DC

## 2022-06-30 MED ORDER — TADALAFIL 20 MG PO TABS: 20.0000 mg | ORAL_TABLET | Freq: Every day | ORAL | 11 refills | Status: AC | PRN

## 2022-06-30 MED ORDER — ATORVASTATIN CALCIUM 40 MG PO TABS: 40.0000 mg | ORAL_TABLET | Freq: Every day | ORAL | 1 refills | Status: DC

## 2022-06-30 MED ORDER — HYDROCHLOROTHIAZIDE 25 MG PO TABS: 25.0000 mg | ORAL_TABLET | Freq: Every day | ORAL | 1 refills | Status: DC

## 2022-06-30 MED ORDER — SERTRALINE HCL 25 MG PO TABS: 25.0000 mg | ORAL_TABLET | Freq: Every day | ORAL | 1 refills | Status: DC

## 2022-06-30 NOTE — Addendum Note (Signed)
Addended by: Westley Hummer B on: 06/30/2022 02:58 PM   Modules accepted: Orders

## 2022-06-30 NOTE — Progress Notes (Signed)
Established Patient Office Visit     CC/Reason for Visit: Follow-up chronic conditions  HPI: Gary Clark is a 51 y.o. male who is coming in today for the above mentioned reasons. Past Medical History is significant for: Hyper tension, hyperlipidemia, obesity, depression.  He is feeling well today.  He states he ran out of medications about 4 days ago.  Believes this is why blood pressure is high.  Is requesting flu vaccination today.   Past Medical/Surgical History: Past Medical History:  Diagnosis Date   Allergy    Anxiety    Arthritis    Asthma    years ago   Back pain    Blood in stool    Cluster headache    Depression    Headache(784.0)    frequent    History of kidney stones    passed 2 times   Hyperlipidemia    Hypertension    Lumbosacral spondylosis without myelopathy 06/15/2013   Migraines     10/27/2018- history of. Not current   Postlaminectomy syndrome, lumbar region 03/08/2015   Status post L4-L5 decompression and fusion, anterior/posterior in 2011    Pre-diabetes    pre diabetic per his PCP   Sleep apnea    tested in 2014 positive    Stroke [facility]) 2008   pt states "mini-Stroke"-    UTI (urinary tract infection)     Past Surgical History:  Procedure Laterality Date   ANTERIOR AND POSTERIOR SPINAL FUSION  2011   ANTERIOR LAT LUMBAR FUSION N/A 10/01/2016   Procedure: XLIF L3-4 ANTERIOR LATERAL LUMBAR FUSION 1 LEVEL;  Surgeon: Melina Schools, MD;  Location: [place];  Service: Orthopedics;  Laterality: N/A;   BACK SURGERY     HARDWARE REMOVAL N/A 10/01/2016   Procedure: Removal of pedical screws L4-5 HARDWARE REMOVAL;  Surgeon: Melina Schools, MD;  Location: [place];  Service: Orthopedics;  Laterality: N/A;   LUMBAR LAMINECTOMY/DECOMPRESSION MICRODISCECTOMY  2010    Social History:  reports that he has never smoked. He has never been exposed to tobacco smoke. He has never used smokeless tobacco. He reports current alcohol use of about 10.0 standard drinks  of alcohol per week. He reports that he does not use drugs.  Allergies: Allergies  Allergen Reactions   Codeine Other (See Comments)    Gets "loopy"    Family History:  Family History  Problem Relation Age of Onset   Heart disease Mother    Hypertension Mother    Arthritis Father    Hypertension Father    Prostate cancer Father    25 / Stillbirths Maternal Grandmother    Heart disease Maternal Grandfather    Sudden death Maternal Grandfather    Breast cancer Sister    Migraines Sister      Current Outpatient Medications:    atorvastatin (LIPITOR) 40 MG tablet, Take 1 tablet (40 mg total) by mouth daily., Disp: 90 tablet, Rfl: 1   citalopram (CELEXA) 40 MG tablet, Take 1 tablet (40 mg total) by mouth daily., Disp: 90 tablet, Rfl: 1   hydrochlorothiazide (HYDRODIURIL) 25 MG tablet, Take 1 tablet (25 mg total) by mouth daily., Disp: 90 tablet, Rfl: 1   lisinopril (ZESTRIL) 40 MG tablet, Take 1 tablet (40 mg total) by mouth daily., Disp: 90 tablet, Rfl: 1   sertraline (ZOLOFT) 25 MG tablet, Take 1 tablet (25 mg total) by mouth at bedtime., Disp: 90 tablet, Rfl: 1   tadalafil (CIALIS) 20 MG tablet, Take 1 tablet (  20 mg total) by mouth daily as needed for erectile dysfunction., Disp: 5 tablet, Rfl: 11  Review of Systems:  Constitutional: Denies fever, chills, diaphoresis, appetite change and fatigue.  HEENT: Denies photophobia, eye pain, redness, hearing loss, ear pain, congestion, sore throat, rhinorrhea, sneezing, mouth sores, trouble swallowing, neck pain, neck stiffness and tinnitus.   Respiratory: Denies SOB, DOE, cough, chest tightness,  and wheezing.   Cardiovascular: Denies chest pain, palpitations and leg swelling.  Gastrointestinal: Denies nausea, vomiting, abdominal pain, diarrhea, constipation, blood in stool and abdominal distention.  Genitourinary: Denies dysuria, urgency, frequency, hematuria, flank pain and difficulty urinating.  Endocrine: Denies: hot or  cold intolerance, sweats, changes in hair or nails, polyuria, polydipsia. Musculoskeletal: Denies myalgias, back pain, joint swelling, arthralgias and gait problem.  Skin: Denies pallor, rash and wound.  Neurological: Denies dizziness, seizures, syncope, weakness, light-headedness, numbness and headaches.  Hematological: Denies adenopathy. Easy bruising, personal or family bleeding history  Psychiatric/Behavioral: Denies suicidal ideation, mood changes, confusion, nervousness, sleep disturbance and agitation    Physical Exam: Vitals:   06/30/22 1426 06/30/22 1431  BP: (!) 160/120 (!) 164/117  Pulse: 80   Temp: 98.3 F (36.8 C)   TempSrc: Oral   SpO2: 98%   Weight: 229 lb 14.4 oz (104.3 kg)     Body mass index is 39.16 kg/m.   Constitutional: NAD, calm, comfortable Eyes: PERRL, lids and conjunctivae normal ENMT: Mucous membranes are moist.  Respiratory: clear to auscultation bilaterally, no wheezing, no crackles. Normal respiratory effort. No accessory muscle use.  Cardiovascular: Regular rate and rhythm, no murmurs / rubs / gallops. No extremity edema. Psychiatric: Normal judgment and insight. Alert and oriented x 3. Normal mood.    Impression and Plan:  Essential hypertension - Plan: hydrochlorothiazide (HYDRODIURIL) 25 MG tablet, lisinopril (ZESTRIL) 40 MG tablet, CBC with Differential/Platelet, Comprehensive metabolic panel  Mixed hyperlipidemia - Plan: atorvastatin (LIPITOR) 40 MG tablet, Lipid panel  Major depressive disorder, recurrent, in remission (HCC) - Plan: citalopram (CELEXA) 40 MG tablet, sertraline (ZOLOFT) 25 MG tablet  Erectile dysfunction, unspecified erectile dysfunction type - Plan: tadalafil (CIALIS) 20 MG tablet  Morbid obesity (HCC) - Plan: Hemoglobin A1c  Need for influenza vaccination  -Flu vaccine administered today. -All medications have been refilled. -Discussed healthy lifestyle, including increased physical activity and better food  choices to promote weight loss.  His insurance did not cover any GLP-1/GIP, referral to medical weight loss was placed a few months back and he is still waiting for appointment. -Blood pressure is very uncontrolled, he has been out of medications for a few days so suspect this is the reason.  He will resume medications and follow-up with me in 3 months. -His mood is stable but needs refills of antidepressants.   Time spent:31 minutes reviewing chart, interviewing and examining patient and formulating plan of care.      Chaya Jan, MD [facility]

## 2022-07-01 ENCOUNTER — Encounter: Payer: Self-pay | Admitting: Internal Medicine

## 2022-07-01 DIAGNOSIS — R7302 Impaired glucose tolerance (oral): Secondary | ICD-10-CM | POA: Insufficient documentation

## 2022-08-11 ENCOUNTER — Telehealth: Payer: Self-pay | Admitting: Internal Medicine

## 2022-08-11 NOTE — Telephone Encounter (Signed)
Pt called and chose mounjaro

## 2022-08-11 NOTE — Telephone Encounter (Signed)
Pt seen md on 02-17-2022 and he forgot which wt loss medication his insurance will cover. Pt would like md to call in both [id number] - [place] ([place]), [place] - [address] Phone: [phone]  Fax: [phone]

## 2022-08-11 NOTE — Telephone Encounter (Signed)
Left message on machine for patient.  Which medication would he liked to be called in?

## 2022-08-12 ENCOUNTER — Other Ambulatory Visit: Payer: Self-pay | Admitting: Internal Medicine

## 2022-08-12 DIAGNOSIS — R7302 Impaired glucose tolerance (oral): Secondary | ICD-10-CM

## 2022-08-12 MED ORDER — TIRZEPATIDE 2.5 MG/0.5ML ~~LOC~~ SOAJ: 2.5000 mg | SUBCUTANEOUS | 0 refills | Status: DC

## 2022-08-14 NOTE — Telephone Encounter (Signed)
Pt called to say his insurance will not cover Mounjaro ($1200) and Pt is asking for an alternative.  Please send to:  [id number] - [place] ([place]), [place] - [address] Phone: [phone]  Fax: [phone]

## 2022-08-17 NOTE — Telephone Encounter (Signed)
PA requested for [facility] Key: [id number]

## 2022-08-17 NOTE — Telephone Encounter (Signed)
PA is approved  until 09/14/23.  Patient is aware.

## 2022-09-24 DIAGNOSIS — M5416 Radiculopathy, lumbar region: Secondary | ICD-10-CM | POA: Diagnosis not present

## 2022-10-08 DIAGNOSIS — M5416 Radiculopathy, lumbar region: Secondary | ICD-10-CM | POA: Diagnosis not present

## 2022-10-12 ENCOUNTER — Telehealth: Payer: Self-pay | Admitting: Internal Medicine

## 2022-10-12 DIAGNOSIS — R7302 Impaired glucose tolerance (oral): Secondary | ICD-10-CM

## 2022-10-12 MED ORDER — TIRZEPATIDE 2.5 MG/0.5ML ~~LOC~~ SOAJ: 2.5000 mg | SUBCUTANEOUS | 0 refills | Status: DC

## 2022-10-12 NOTE — Telephone Encounter (Signed)
Refill sent.

## 2022-10-12 NOTE — Telephone Encounter (Signed)
Pt called to ask if MD could please send another RX for the [facility] to [place] ([place]), [place] - [address] Phone: [phone]  Fax: [phone]     Pt states he never used the first one because he could not afford it.  LOV:  06/30/22

## 2022-10-21 ENCOUNTER — Telehealth: Payer: Self-pay | Admitting: *Deleted

## 2022-10-21 NOTE — Telephone Encounter (Signed)
Prior Gary Clark has been started for  [facility] Key: [id number]

## 2022-10-22 NOTE — Telephone Encounter (Signed)
PA was denied.  Would you like for me to start an appeal or would you like to prescribe something else?

## 2022-10-22 NOTE — Telephone Encounter (Signed)
Patient is aware 

## 2022-12-14 ENCOUNTER — Telehealth: Payer: Self-pay | Admitting: Internal Medicine

## 2022-12-14 NOTE — Telephone Encounter (Signed)
Pt states his insurance is active now and requesting a prescription for tirzepatide [facility]) 2.5 MG/0.5ML Pen   [place] ([place]), [place] - [address] Phone: [phone]  Fax: [phone]

## 2022-12-15 ENCOUNTER — Other Ambulatory Visit: Payer: Self-pay | Admitting: Internal Medicine

## 2022-12-15 DIAGNOSIS — R7302 Impaired glucose tolerance (oral): Secondary | ICD-10-CM

## 2022-12-15 MED ORDER — TIRZEPATIDE 2.5 MG/0.5ML ~~LOC~~ SOAJ: 2.5000 mg | SUBCUTANEOUS | 0 refills | Status: DC

## 2022-12-15 NOTE — Telephone Encounter (Signed)
Pt called to FU on this refill, stating he contacted the pharmacy and was told it has not been sent in yet.  Pt was informed that MD was OOO yesterday, but has returned to the office today.

## 2022-12-27 ENCOUNTER — Other Ambulatory Visit: Payer: Self-pay | Admitting: Internal Medicine

## 2022-12-27 DIAGNOSIS — I10 Essential (primary) hypertension: Secondary | ICD-10-CM

## 2023-02-15 ENCOUNTER — Telehealth: Payer: Self-pay | Admitting: Internal Medicine

## 2023-02-15 ENCOUNTER — Other Ambulatory Visit: Payer: Self-pay | Admitting: Internal Medicine

## 2023-02-15 DIAGNOSIS — R7302 Impaired glucose tolerance (oral): Secondary | ICD-10-CM

## 2023-02-15 MED ORDER — TIRZEPATIDE 5 MG/0.5ML ~~LOC~~ SOAJ: 5.0000 mg | SUBCUTANEOUS | 0 refills | Status: DC

## 2023-02-15 NOTE — Telephone Encounter (Signed)
Pt would like to know if MD could increase the dosage on the [facility]?  Please advise.  LOV:    06/30/22

## 2023-03-09 NOTE — Progress Notes (Unsigned)
ACUTE VISIT No chief complaint on file.  HPI: Mr.Gary Clark is a 52 y.o. male, who is here today complaining of *** HPI  Review of Systems See other pertinent positives and negatives in HPI.  Current Outpatient Medications on File Prior to Visit  Medication Sig Dispense Refill   atorvastatin (LIPITOR) 40 MG tablet Take 1 tablet (40 mg total) by mouth daily. 90 tablet 1   citalopram (CELEXA) 40 MG tablet Take 1 tablet (40 mg total) by mouth daily. 90 tablet 1   hydrochlorothiazide (HYDRODIURIL) 25 MG tablet Take 1 tablet (25 mg total) by mouth daily. 90 tablet 1   lisinopril (ZESTRIL) 40 MG tablet Take 1 tablet (40 mg total) by mouth daily. 90 tablet 1   sertraline (ZOLOFT) 25 MG tablet Take 1 tablet (25 mg total) by mouth at bedtime. 90 tablet 1   tadalafil (CIALIS) 20 MG tablet Take 1 tablet (20 mg total) by mouth daily as needed for erectile dysfunction. 5 tablet 11   tirzepatide (MOUNJARO) 5 MG/0.5ML Pen Inject 5 mg into the skin once a week. 2 mL 0   [DISCONTINUED] amitriptyline (ELAVIL) 25 MG tablet Take 1 tablet (25 mg total) by mouth at bedtime. 30 tablet 1   [DISCONTINUED] cetirizine (ZYRTEC) 10 MG tablet Take 1 tablet (10 mg total) by mouth daily. 30 tablet 0   [DISCONTINUED] fluticasone (FLONASE) 50 MCG/ACT nasal spray Place 2 sprays into both nostrils daily. 16 g 3   [DISCONTINUED] ipratropium (ATROVENT) 0.06 % nasal spray Place 2 sprays into both nostrils 4 (four) times daily. 15 mL 0   [DISCONTINUED] rosuvastatin (CRESTOR) 40 MG tablet Take 1 tablet (40 mg total) by mouth daily. 90 tablet 3   No current facility-administered medications on file prior to visit.    Past Medical History:  Diagnosis Date   Allergy    Anxiety    Arthritis    Asthma    years ago   Back pain    Blood in stool    Cluster headache    Depression    Headache(784.0)    frequent    History of kidney stones    passed 2 times   Hyperlipidemia    Hypertension    Lumbosacral  spondylosis without myelopathy 06/15/2013   Migraines     10/27/2018- history of. Not current   Postlaminectomy syndrome, lumbar region 03/08/2015   Status post L4-L5 decompression and fusion, anterior/posterior in 2011    Pre-diabetes    pre diabetic per his PCP   Sleep apnea    tested in 2014 positive    Stroke [facility]) 2008   pt states "mini-Stroke"-    UTI (urinary tract infection)    Allergies  Allergen Reactions   Codeine Other (See Comments)    Gets "loopy"    Social History   Socioeconomic History   Marital status: Single    Spouse name: [name] Woodstock   Number of children: 1   Years of education: 12th   Highest education level: 12th grade  Occupational History    Comment: n/a  Tobacco Use   Smoking status: Never    Passive exposure: Never   Smokeless tobacco: Never  Vaping Use   Vaping Use: Never used  Substance and Sexual Activity   Alcohol use: Yes    Alcohol/week: 10.0 standard drinks of alcohol    Types: 10 Cans of beer per week   Drug use: No   Sexual activity: Not Currently  Other Topics Concern  Not on file  Social History Narrative   Patient lives at home with his family.   Caffeine Use: 2 cups daily   Social Determinants of Health   Financial Resource Strain: Low Risk  (07/17/2021)   Overall Financial Resource Strain (CARDIA)    Difficulty of Paying Living Expenses: Not hard at all  Food Insecurity: No Food Insecurity (07/17/2021)   Hunger Vital Sign    Worried About Running Out of Food in the Last Year: Never true    Ran Out of Food in the Last Year: Never true  Transportation Needs: No Transportation Needs (07/17/2021)   PRAPARE - Administrator, Civil Service (Medical): No    Lack of Transportation (Non-Medical): No  Physical Activity: Inactive (07/17/2021)   Exercise Vital Sign    Days of Exercise per Week: 0 days    Minutes of Exercise per Session: 0 min  Stress: Stress Concern Present (07/17/2021)   [employer] of  Occupational Health - Occupational Stress Questionnaire    Feeling of Stress : Very much  Social Connections: Moderately Integrated (07/17/2021)   Social Connection and Isolation Panel [NHANES]    Frequency of Communication with Friends and Family: More than three times a week    Frequency of Social Gatherings with Friends and Family: More than three times a week    Attends Religious Services: 1 to 4 times per year    Active Member of [employer] or Organizations: Yes    Attends Banker Meetings: 1 to 4 times per year    Marital Status: Separated    There were no vitals filed for this visit. There is no height or weight on file to calculate BMI.  Physical Exam  ASSESSMENT AND PLAN: There are no diagnoses linked to this encounter.  No follow-ups on file.  Adreona Brand G. [place], MD  [facility]. [place] office.  Discharge Instructions   None

## 2023-03-10 ENCOUNTER — Ambulatory Visit (INDEPENDENT_AMBULATORY_CARE_PROVIDER_SITE_OTHER): Payer: Medicare PPO | Admitting: Family Medicine

## 2023-03-10 ENCOUNTER — Encounter: Payer: Self-pay | Admitting: Family Medicine

## 2023-03-10 VITALS — BP 124/80 | HR 90 | Resp 16 | Ht 64.25 in | Wt 211.0 lb

## 2023-03-10 DIAGNOSIS — M79672 Pain in left foot: Secondary | ICD-10-CM

## 2023-03-10 DIAGNOSIS — R1013 Epigastric pain: Secondary | ICD-10-CM

## 2023-03-10 MED ORDER — CELECOXIB 100 MG PO CAPS: 100.0000 mg | ORAL_CAPSULE | Freq: Two times a day (BID) | ORAL | 0 refills | Status: AC

## 2023-03-10 NOTE — Patient Instructions (Addendum)
A few things to remember from today's visit:  Foot pain, left  Dyspepsia  Celebrex for 7-10 days. Comfortable shoe. Stress fracture is in the differential. You can arrange appt with podiatrist if not better. You can also call later to have left X ray if you cannot do it today.  For bloating and burping you can try gas X or beano but wt loss med is most likely causing symptoms.  Do not use My Chart to request refills or for acute issues that need immediate attention. If you send a my chart message, it may take a few days to be addressed, specially if I am not in the office.  Please be sure medication list is accurate. If a new problem present, please set up appointment sooner than planned today.

## 2023-03-19 ENCOUNTER — Other Ambulatory Visit: Payer: Self-pay | Admitting: Internal Medicine

## 2023-03-19 DIAGNOSIS — R7302 Impaired glucose tolerance (oral): Secondary | ICD-10-CM

## 2023-03-19 DIAGNOSIS — F3340 Major depressive disorder, recurrent, in remission, unspecified: Secondary | ICD-10-CM

## 2023-03-22 NOTE — Telephone Encounter (Signed)
Pt checking on progress

## 2023-04-14 ENCOUNTER — Other Ambulatory Visit: Payer: Self-pay | Admitting: Internal Medicine

## 2023-04-14 DIAGNOSIS — R7302 Impaired glucose tolerance (oral): Secondary | ICD-10-CM

## 2023-04-15 ENCOUNTER — Other Ambulatory Visit (HOSPITAL_COMMUNITY): Payer: Self-pay

## 2023-04-15 ENCOUNTER — Telehealth: Payer: Self-pay | Admitting: Internal Medicine

## 2023-04-15 NOTE — Telephone Encounter (Signed)
Pt called to say the:  [facility] - [place] ([place]), [place] - [address] Phone: [phone]  Fax: [phone]     ...is waiting for the PA from MD for the [facility] refill MOUNJARO 5 MG/0.5ML Pen

## 2023-04-15 NOTE — Telephone Encounter (Signed)
Ran test claim, received a paid claim. Called pharmacy, they also received paid claim. No PA needed at this time. Patient copay is $103.28

## 2023-04-19 NOTE — Telephone Encounter (Signed)
Spoke to the patient and he picked his prescription up 04/18/23.

## 2023-05-13 ENCOUNTER — Other Ambulatory Visit: Payer: Self-pay | Admitting: Internal Medicine

## 2023-05-13 DIAGNOSIS — R7302 Impaired glucose tolerance (oral): Secondary | ICD-10-CM

## 2023-06-17 ENCOUNTER — Other Ambulatory Visit: Payer: Self-pay | Admitting: Internal Medicine

## 2023-06-17 DIAGNOSIS — F3340 Major depressive disorder, recurrent, in remission, unspecified: Secondary | ICD-10-CM

## 2023-06-25 ENCOUNTER — Telehealth: Payer: Self-pay

## 2023-06-25 NOTE — Telephone Encounter (Signed)
Unsuccessful attempt to reach patient on preferred number listed in notes for scheduled AWV. Left message on voicemail okay to reschedule. 

## 2023-07-20 ENCOUNTER — Other Ambulatory Visit: Payer: Self-pay | Admitting: Internal Medicine

## 2023-07-20 DIAGNOSIS — R7302 Impaired glucose tolerance (oral): Secondary | ICD-10-CM

## 2023-09-06 ENCOUNTER — Other Ambulatory Visit (HOSPITAL_COMMUNITY): Payer: Self-pay

## 2023-09-10 ENCOUNTER — Other Ambulatory Visit: Payer: Self-pay | Admitting: Internal Medicine

## 2023-09-10 DIAGNOSIS — I10 Essential (primary) hypertension: Secondary | ICD-10-CM

## 2023-09-10 NOTE — Telephone Encounter (Signed)
Copied from CRM [id number]. Topic: Clinical - Medication Refill >> Sep 10, 2023 10:04 AM Irine Seal wrote: Most Recent Primary Care Visit:  Provider: [place], BETTY G  Department: [facility]  Visit Type: OFFICE VISIT  Date: 03/10/2023  Medication: ***  Has the patient contacted their pharmacy?  (Agent: If no, request that the patient contact the pharmacy for the refill. If patient does not wish to contact the pharmacy document the reason why and proceed with request.) (Agent: If yes, when and what did the pharmacy advise?)  Is this the correct pharmacy for this prescription?  If no, delete pharmacy and type the correct one.  This is the patient's preferred pharmacy:  [facility] - [place] ([place]), [place] - [address] [place] ([place]) [place] [address] Phone: [phone] Fax: [phone]   Has the prescription been filled recently?   Is the patient out of the medication?   Has the patient been seen for an appointment in the last year OR does the patient have an upcoming appointment?   Can we respond through MyChart?   Agent: Please be advised that Rx refills may take up to 3 business days. We ask that you follow-up with your pharmacy.

## 2023-09-13 ENCOUNTER — Telehealth: Payer: Self-pay

## 2023-09-13 DIAGNOSIS — I10 Essential (primary) hypertension: Secondary | ICD-10-CM

## 2023-09-13 MED ORDER — LISINOPRIL 40 MG PO TABS
40.0000 mg | ORAL_TABLET | Freq: Every day | ORAL | 0 refills | Status: DC
Start: 2023-09-13 — End: 2023-10-11

## 2023-09-13 NOTE — Telephone Encounter (Signed)
Copied from CRM [id number]. Topic: Clinical - Medication Refill >> Sep 10, 2023 10:04 AM Irine Seal wrote: Most Recent Primary Care Visit:  Provider: [place], BETTY G  Department: [facility]  Visit Type: OFFICE VISIT  Date: 03/10/2023  Medication: lisinopril (ZESTRIL) 40 MG tablet  Has the patient contacted their pharmacy? Yes (Agent: If no, request that the patient contact the pharmacy for the refill. If patient does not wish to contact the pharmacy document the reason why and proceed with request.) (Agent: If yes, when and what did the pharmacy advise?)  Is this the correct pharmacy for this prescription? Yes If no, delete pharmacy and type the correct one.  This is the patient's preferred pharmacy:  [id number] - [place] ([place]), [place] - [address] [place] ([place]) [place] [address] Phone: [phone] Fax: [phone]   Has the prescription been filled recently? Yes  Is the patient out of the medication? Yes  Has the patient been seen for an appointment in the last year OR does the patient have an upcoming appointment? Yes  Can we respond through MyChart? Yes  Agent: Please be advised that Rx refills may take up to 3 business days. We ask that you follow-up with your pharmacy.

## 2023-09-14 ENCOUNTER — Encounter: Payer: Self-pay | Admitting: Internal Medicine

## 2023-09-14 ENCOUNTER — Telehealth (INDEPENDENT_AMBULATORY_CARE_PROVIDER_SITE_OTHER): Payer: Medicare PPO | Admitting: Internal Medicine

## 2023-09-14 DIAGNOSIS — E66812 Obesity, class 2: Secondary | ICD-10-CM

## 2023-09-14 DIAGNOSIS — E785 Hyperlipidemia, unspecified: Secondary | ICD-10-CM | POA: Diagnosis not present

## 2023-09-14 DIAGNOSIS — R7302 Impaired glucose tolerance (oral): Secondary | ICD-10-CM

## 2023-09-14 DIAGNOSIS — R519 Headache, unspecified: Secondary | ICD-10-CM | POA: Diagnosis not present

## 2023-09-14 DIAGNOSIS — Z6836 Body mass index (BMI) 36.0-36.9, adult: Secondary | ICD-10-CM

## 2023-09-14 DIAGNOSIS — I10 Essential (primary) hypertension: Secondary | ICD-10-CM | POA: Diagnosis not present

## 2023-09-14 NOTE — Progress Notes (Signed)
 Patient was unable to self-report due to a lack of equipment at home via telehealth

## 2023-09-14 NOTE — Progress Notes (Signed)
 Virtual Visit via Video Note  I connected with Gary Clark on 09/14/23 at  7:00 AM EST by a video enabled telemedicine application and verified that I am speaking with the correct person using two identifiers.  Location patient: home Location provider: work office Persons participating in the virtual visit: patient, provider  I discussed the limitations of evaluation and management by telemedicine and the availability of in person appointments. The patient expressed understanding and agreed to proceed.   HPI: He has been experiencing headaches for a few weeks.  Headaches are posterior.  Pretty well-controlled with Excedrin Migraine and ibuprofen  but he has been taking a lot of these over-the-counter medications.  He was last seen in October 2023 at which time he had a blood pressure of 160/120.  He has not been checking blood pressure routinely.  He also had an A1c of 6.4 at the time.  States he has been adherent to medical therapy.   ROS: Negative unless indicated in HPI.  Past Medical History:  Diagnosis Date   Allergy    Anxiety    Arthritis    Asthma    years ago   Back pain    Blood in stool    Cluster headache    Depression    Headache(784.0)    frequent    History of kidney stones    passed 2 times   Hyperlipidemia    Hypertension    Lumbosacral spondylosis without myelopathy 06/15/2013   Migraines     10/27/2018- history of. Not current   Postlaminectomy syndrome, lumbar region 03/08/2015   Status post L4-L5 decompression and fusion, anterior/posterior in 2011    Pre-diabetes    pre diabetic per his PCP   Sleep apnea    tested in 2014 positive    Stroke [facility]) 2008   pt states mini-Stroke-    UTI (urinary tract infection)     Past Surgical History:  Procedure Laterality Date   ANTERIOR AND POSTERIOR SPINAL FUSION  2011   ANTERIOR LAT LUMBAR FUSION N/A 10/01/2016   Procedure: XLIF L3-4 ANTERIOR LATERAL LUMBAR FUSION 1 LEVEL;  Surgeon: Donaciano Sprang,  MD;  Location: [facility] OR;  Service: Orthopedics;  Laterality: N/A;   BACK SURGERY     HARDWARE REMOVAL N/A 10/01/2016   Procedure: Removal of pedical screws L4-5 HARDWARE REMOVAL;  Surgeon: Donaciano Sprang, MD;  Location: [facility] OR;  Service: Orthopedics;  Laterality: N/A;   LUMBAR LAMINECTOMY/DECOMPRESSION MICRODISCECTOMY  2010    Family History  Problem Relation Age of Onset   Heart disease Mother    Hypertension Mother    Arthritis Father    Hypertension Father    Prostate cancer Father    Miscarriages / Stillbirths Maternal Grandmother    Heart disease Maternal Grandfather    Sudden death Maternal Grandfather    Breast cancer Sister    Migraines Sister     SOCIAL HX:   reports that he has never smoked. He has never been exposed to tobacco smoke. He has never used smokeless tobacco. He reports current alcohol use of about 10.0 standard drinks of alcohol per week. He reports that he does not use drugs.   Current Outpatient Medications:    atorvastatin  (LIPITOR) 40 MG tablet, Take 1 tablet (40 mg total) by mouth daily., Disp: 90 tablet, Rfl: 1   citalopram  (CELEXA ) 40 MG tablet, Take 1 tablet (40 mg total) by mouth daily., Disp: 90 tablet, Rfl: 1   hydrochlorothiazide  (HYDRODIURIL ) 25 MG tablet, Take  1 tablet (25 mg total) by mouth daily., Disp: 90 tablet, Rfl: 1   lisinopril  (ZESTRIL ) 40 MG tablet, Take 1 tablet (40 mg total) by mouth daily., Disp: 90 tablet, Rfl: 0   MOUNJARO  5 MG/0.5ML Pen, INJECT 5 MG SUBCUTANEOUSLY ONCE A WEEK, Disp: 8 mL, Rfl: 0   sertraline  (ZOLOFT ) 25 MG tablet, TAKE 1 TABLET BY MOUTH AT BEDTIME, Disp: 90 tablet, Rfl: 0   tadalafil  (CIALIS ) 20 MG tablet, Take 1 tablet (20 mg total) by mouth daily as needed for erectile dysfunction., Disp: 5 tablet, Rfl: 11  EXAM:   VITALS per patient if applicable: None reported  GENERAL: alert, oriented, appears well and in no acute distress  HEENT: atraumatic, conjunttiva clear, no obvious abnormalities on inspection of  external nose and ears  NECK: normal movements of the head and neck  LUNGS: on inspection no signs of respiratory distress, breathing rate appears normal, no obvious gross increased work of breathing, gasping or wheezing  CV: no obvious cyanosis  MS: moves all visible extremities without noticeable abnormality  PSYCH/NEURO: pleasant and cooperative, no obvious depression or anxiety, speech and thought processing grossly intact  ASSESSMENT AND PLAN:   Acute nonintractable headache, unspecified headache type  IGT (impaired glucose tolerance)  Primary hypertension  Hyperlipidemia, unspecified hyperlipidemia type  Class 2 severe obesity with serious comorbidity and body mass index (BMI) of 36.0 to 36.9 in adult, unspecified obesity type (HCC)  -I suspect his headache is related to elevated blood pressures.  Have advised that he may continue to take over-the-counter medications until he is seen in office.  He will do ambulatory blood pressure monitoring between now and his office visit.   I discussed the assessment and treatment plan with the patient. The patient was provided an opportunity to ask questions and all were answered. The patient agreed with the plan and demonstrated an understanding of the instructions.   The patient was advised to call back or seek an in-person evaluation if the symptoms worsen or if the condition fails to improve as anticipated.    Gary Theophilus Andrews, MD  [facility]

## 2023-09-16 ENCOUNTER — Other Ambulatory Visit: Payer: Medicare PPO

## 2023-09-20 MED ORDER — LISINOPRIL 40 MG PO TABS
40.0000 mg | ORAL_TABLET | Freq: Every day | ORAL | 0 refills | Status: DC
Start: 2023-09-20 — End: 2024-06-21

## 2023-10-06 ENCOUNTER — Ambulatory Visit (HOSPITAL_COMMUNITY)
Admission: EM | Admit: 2023-10-06 | Discharge: 2023-10-06 | Disposition: A | Discharge status: Home / Self Care | Payer: Medicare HMO | Attending: Family Medicine | Admitting: Family Medicine

## 2023-10-06 ENCOUNTER — Ambulatory Visit (HOSPITAL_COMMUNITY): Payer: Self-pay

## 2023-10-06 ENCOUNTER — Encounter (HOSPITAL_COMMUNITY): Payer: Self-pay

## 2023-10-06 DIAGNOSIS — I10 Essential (primary) hypertension: Secondary | ICD-10-CM | POA: Diagnosis not present

## 2023-10-06 DIAGNOSIS — R519 Headache, unspecified: Secondary | ICD-10-CM | POA: Diagnosis not present

## 2023-10-06 MED ORDER — KETOROLAC TROMETHAMINE 60 MG/2ML IM SOLN
60.0000 mg | Freq: Once | INTRAMUSCULAR | Status: AC
  Administered 2023-10-06: 60 mg via INTRAMUSCULAR

## 2023-10-06 MED ORDER — DEXAMETHASONE SODIUM PHOSPHATE 10 MG/ML IJ SOLN
INTRAMUSCULAR | Status: AC
  Filled 2023-10-06: qty 1

## 2023-10-06 MED ORDER — DEXAMETHASONE SODIUM PHOSPHATE 10 MG/ML IJ SOLN
10.0000 mg | Freq: Once | INTRAMUSCULAR | Status: AC
  Administered 2023-10-06: 10 mg via INTRAMUSCULAR

## 2023-10-06 MED ORDER — KETOROLAC TROMETHAMINE 60 MG/2ML IM SOLN
INTRAMUSCULAR | Status: AC
Start: 2023-10-06 — End: ?
  Filled 2023-10-06: qty 2

## 2023-10-06 NOTE — ED Triage Notes (Signed)
Pt c/o headache to back of head x1wk and worsen and constant in past 3 days. States PCP wanted to do a virtual visit and wanted to be seen. States currently on b/p meds but having HTN since headaches started. Taking tylenol and motrin and ice/heat with no relief.

## 2023-10-06 NOTE — Discharge Instructions (Signed)
Meds ordered this encounter  Medications   ketorolac (TORADOL) injection 60 mg   dexamethasone (DECADRON) injection 10 mg    

## 2023-10-09 NOTE — ED Provider Notes (Signed)
[facility]   [id number] 10/06/23 Arrival Time: 1658  ASSESSMENT & PLAN:  1. Bad headache   2. Elevated blood pressure reading with diagnosis of hypertension    Meds ordered this encounter  Medications   ketorolac (TORADOL) injection 60 mg   dexamethasone (DECADRON) injection 10 mg   Normal neurological exam. Afebrile without nuchal rigidity. Discussed. Current presentation and symptoms are consistent with prior headaches and are not consistent with SAH, ICH, meningitis, or temporal arteritis. No indication for neurodiagnostic workup at this time. Discussed.  Recommend:  Follow-up Information     [facility].   Specialty: Emergency Medicine Why: If symptoms worsen in any way. Contact information: [address] [place] [address] [phone]               Rec PCP f/u to recheck BP.   Reviewed expectations re: course of current medical issues. Questions answered. Outlined signs and symptoms indicating need for more acute intervention. Patient verbalized understanding. After Visit Summary given.   SUBJECTIVE: History from: Patient Patient is able to give a clear and coherent history.  Gary Clark is a 53 y.o. male who presents with complaint of a migraine headache. Onset gradual, over the past week; off/on. Location: occipital without radiation. History of headaches: yes; similar symptoms/location. Precipitating factors include: none which have been determined. Associated symptoms: Preceding aura: no. Nausea/vomiting: no. Vision changes: no. Increased sensitivity to light and to noises: no. Fever: no. Sinus pressure/congestion: no. Extremity weakness: no. OTC analgesics without much relief. Current headache has limited normal daily activities. Has noted BP slightly higher than normal recent weeks. Denies dizziness, loss of balance, muscle weakness, numbness of extremities, speech  difficulties, and vision problems. No head injury reported. Ambulatory without difficulty. No recent travel.   OBJECTIVE:  Vitals:   10/06/23 1756  BP: (!) 150/103  Pulse: 95  Resp: 20  Temp: 98 F (36.7 C)  TempSrc: Oral  SpO2: 97%    General appearance: alert; NAD HENT: normocephalic; atraumatic Eyes: PERRLA; EOMI; conjunctivae normal Neck: supple with FROM Lungs: clear to auscultation bilaterally; unlabored respirations Heart: regular rate and rhythm Extremities: no edema; symmetrical with no gross deformities Skin: warm and dry Neurologic: alert; speech is fluent and clear without dysarthria or aphasia; CN 2-12 grossly intact; no facial droop; normal gait; normal symmetric reflexes; normal extremity strength and sensation throughout; bilateral upper and lower extremity sensation is grossly intact with 5/5 symmetric strength; normal grip strength Psychological: alert and cooperative; normal mood and affect    Allergies  Allergen Reactions   Codeine Other (See Comments)    Gets "loopy"    Past Medical History:  Diagnosis Date   Allergy    Anxiety    Arthritis    Asthma    years ago   Back pain    Blood in stool    Cluster headache    Depression    Headache(784.0)    frequent    History of kidney stones    passed 2 times   Hyperlipidemia    Hypertension    Lumbosacral spondylosis without myelopathy 06/15/2013   Migraines     10/27/2018- history of. Not current   Postlaminectomy syndrome, lumbar region 03/08/2015   Status post L4-L5 decompression and fusion, anterior/posterior in 2011    Pre-diabetes    pre diabetic per his PCP   Sleep apnea    tested in 2014 positive    Stroke [facility]) 2008  pt states "mini-Stroke"-    UTI (urinary tract infection)    Social History   Socioeconomic History   Marital status: Single    Spouse name: [name] Hissong   Number of children: 1   Years of education: 12th   Highest education level: 12th grade  Occupational  History    Comment: n/a  Tobacco Use   Smoking status: Never    Passive exposure: Never   Smokeless tobacco: Never  Vaping Use   Vaping status: Never Used  Substance and Sexual Activity   Alcohol use: Yes    Alcohol/week: 10.0 standard drinks of alcohol    Types: 10 Cans of beer per week   Drug use: No   Sexual activity: Not Currently  Other Topics Concern   Not on file  Social History Narrative   Patient lives at home with his family.   Caffeine Use: 2 cups daily   Social Drivers of Health   Financial Resource Strain: Low Risk  (07/17/2021)   Overall Financial Resource Strain (CARDIA)    Difficulty of Paying Living Expenses: Not hard at all  Food Insecurity: No Food Insecurity (07/17/2021)   Hunger Vital Sign    Worried About Running Out of Food in the Last Year: Never true    Ran Out of Food in the Last Year: Never true  Transportation Needs: No Transportation Needs (07/17/2021)   PRAPARE - Administrator, Civil Service (Medical): No    Lack of Transportation (Non-Medical): No  Physical Activity: Inactive (07/17/2021)   Exercise Vital Sign    Days of Exercise per Week: 0 days    Minutes of Exercise per Session: 0 min  Stress: Stress Concern Present (07/17/2021)   [employer] of Occupational Health - Occupational Stress Questionnaire    Feeling of Stress : Very much  Social Connections: Moderately Integrated (07/17/2021)   Social Connection and Isolation Panel [NHANES]    Frequency of Communication with Friends and Family: More than three times a week    Frequency of Social Gatherings with Friends and Family: More than three times a week    Attends Religious Services: 1 to 4 times per year    Active Member of [employer] or Organizations: Yes    Attends Banker Meetings: 1 to 4 times per year    Marital Status: Separated  Intimate Partner Violence: Not At Risk (07/17/2021)   Humiliation, Afraid, Rape, and Kick questionnaire    Fear of Current or  Ex-Partner: No    Emotionally Abused: No    Physically Abused: No    Sexually Abused: No   Family History  Problem Relation Age of Onset   Heart disease Mother    Hypertension Mother    Arthritis Father    Hypertension Father    Prostate cancer Father    Miscarriages / Stillbirths Maternal Grandmother    Heart disease Maternal Grandfather    Sudden death Maternal Grandfather    Breast cancer Sister    Migraines Sister    Past Surgical History:  Procedure Laterality Date   ANTERIOR AND POSTERIOR SPINAL FUSION  2011   ANTERIOR LAT LUMBAR FUSION N/A 10/01/2016   Procedure: XLIF L3-4 ANTERIOR LATERAL LUMBAR FUSION 1 LEVEL;  Surgeon: Venita Lick, MD;  Location: [facility] OR;  Service: Orthopedics;  Laterality: N/A;   BACK SURGERY     HARDWARE REMOVAL N/A 10/01/2016   Procedure: Removal of pedical screws L4-5 HARDWARE REMOVAL;  Surgeon: Venita Lick, MD;  Location: [facility] OR;  Service: Orthopedics;  Laterality: N/A;   LUMBAR LAMINECTOMY/DECOMPRESSION MICRODISCECTOMY  2010      Mardella Layman, MD 10/09/23 [phone]

## 2023-10-11 ENCOUNTER — Ambulatory Visit (INDEPENDENT_AMBULATORY_CARE_PROVIDER_SITE_OTHER): Payer: Medicare HMO | Admitting: Internal Medicine

## 2023-10-11 ENCOUNTER — Encounter: Payer: Self-pay | Admitting: Internal Medicine

## 2023-10-11 VITALS — BP 110/84 | HR 96 | Temp 98.3°F | Wt 205.3 lb

## 2023-10-11 DIAGNOSIS — Z1159 Encounter for screening for other viral diseases: Secondary | ICD-10-CM | POA: Diagnosis not present

## 2023-10-11 DIAGNOSIS — E785 Hyperlipidemia, unspecified: Secondary | ICD-10-CM | POA: Diagnosis not present

## 2023-10-11 DIAGNOSIS — R7302 Impaired glucose tolerance (oral): Secondary | ICD-10-CM | POA: Diagnosis not present

## 2023-10-11 DIAGNOSIS — I10 Essential (primary) hypertension: Secondary | ICD-10-CM | POA: Diagnosis not present

## 2023-10-11 DIAGNOSIS — F3340 Major depressive disorder, recurrent, in remission, unspecified: Secondary | ICD-10-CM

## 2023-10-11 LAB — CBC WITH DIFFERENTIAL/PLATELET
Basophils Absolute: 0 10*3/uL (ref 0.0–0.1)
Basophils Relative: 0.5 % (ref 0.0–3.0)
Eosinophils Absolute: 0.2 10*3/uL (ref 0.0–0.7)
Eosinophils Relative: 2.4 % (ref 0.0–5.0)
HCT: 46.7 % (ref 39.0–52.0)
Hemoglobin: 15.7 g/dL (ref 13.0–17.0)
Lymphocytes Relative: 25.6 % (ref 12.0–46.0)
Lymphs Abs: 2.6 10*3/uL (ref 0.7–4.0)
MCHC: 33.6 g/dL (ref 30.0–36.0)
MCV: 96.8 fL (ref 78.0–100.0)
Monocytes Absolute: 0.7 10*3/uL (ref 0.1–1.0)
Monocytes Relative: 7.1 % (ref 3.0–12.0)
Neutro Abs: 6.6 10*3/uL (ref 1.4–7.7)
Neutrophils Relative %: 64.4 % (ref 43.0–77.0)
Platelets: 342 10*3/uL (ref 150.0–400.0)
RBC: 4.82 Mil/uL (ref 4.22–5.81)
RDW: 12.9 % (ref 11.5–15.5)
WBC: 10.2 10*3/uL (ref 4.0–10.5)

## 2023-10-11 LAB — COMPREHENSIVE METABOLIC PANEL
ALT: 31 U/L (ref 0–53)
AST: 19 U/L (ref 0–37)
Albumin: 4.7 g/dL (ref 3.5–5.2)
Alkaline Phosphatase: 61 U/L (ref 39–117)
BUN: 24 mg/dL — ABNORMAL HIGH (ref 6–23)
CO2: 28 meq/L (ref 19–32)
Calcium: 10 mg/dL (ref 8.4–10.5)
Chloride: 102 meq/L (ref 96–112)
Creatinine, Ser: 1.1 mg/dL (ref 0.40–1.50)
GFR: 76.55 mL/min (ref >60.00)
Glucose, Bld: 102 mg/dL — ABNORMAL HIGH (ref 70–99)
Potassium: 4.1 meq/L (ref 3.5–5.1)
Sodium: 138 meq/L (ref 135–145)
Total Bilirubin: 0.5 mg/dL (ref 0.2–1.2)
Total Protein: 7.6 g/dL (ref 6.0–8.3)

## 2023-10-11 LAB — LIPID PANEL
Cholesterol: 280 mg/dL — ABNORMAL HIGH (ref 0–200)
HDL: 56.6 mg/dL (ref >39.00)
LDL Cholesterol: 172 mg/dL — ABNORMAL HIGH (ref 0–99)
NonHDL: 223.17
Total CHOL/HDL Ratio: 5
Triglycerides: 257 mg/dL — ABNORMAL HIGH (ref 0.0–149.0)
VLDL: 51.4 mg/dL — ABNORMAL HIGH (ref 0.0–40.0)

## 2023-10-11 LAB — POCT GLYCOSYLATED HEMOGLOBIN (HGB A1C): Hemoglobin A1C: 5.7 % — AB (ref 4.0–5.6)

## 2023-10-11 NOTE — Progress Notes (Signed)
Established Patient Office Visit     CC/Reason for Visit: Hospital follow-up, follow-up chronic conditions  HPI: Gary Clark is a 53 y.o. male who is coming in today for the above mentioned reasons. Past Medical History is significant for: Obesity, impaired glucose tolerance, hypertension, asthma.  He was seen in the emergency department last week with a severe headache.  He was noted to have an elevated blood pressure at the time.  Headache abated with Toradol and dexamethasone.  He has not had a headache since.   Past Medical/Surgical History: Past Medical History:  Diagnosis Date   Allergy    Anxiety    Arthritis    Asthma    years ago   Back pain    Blood in stool    Cluster headache    Depression    Headache(784.0)    frequent    History of kidney stones    passed 2 times   Hyperlipidemia    Hypertension    Lumbosacral spondylosis without myelopathy 06/15/2013   Migraines     10/27/2018- history of. Not current   Postlaminectomy syndrome, lumbar region 03/08/2015   Status post L4-L5 decompression and fusion, anterior/posterior in 2011    Pre-diabetes    pre diabetic per his PCP   Sleep apnea    tested in 2014 positive    Stroke [facility]) 2008   pt states "mini-Stroke"-    UTI (urinary tract infection)     Past Surgical History:  Procedure Laterality Date   ANTERIOR AND POSTERIOR SPINAL FUSION  2011   ANTERIOR LAT LUMBAR FUSION N/A 10/01/2016   Procedure: XLIF L3-4 ANTERIOR LATERAL LUMBAR FUSION 1 LEVEL;  Surgeon: Venita Lick, MD;  Location: [facility] OR;  Service: Orthopedics;  Laterality: N/A;   BACK SURGERY     HARDWARE REMOVAL N/A 10/01/2016   Procedure: Removal of pedical screws L4-5 HARDWARE REMOVAL;  Surgeon: Venita Lick, MD;  Location: [facility] OR;  Service: Orthopedics;  Laterality: N/A;   LUMBAR LAMINECTOMY/DECOMPRESSION MICRODISCECTOMY  2010    Social History:  reports that he has never smoked. He has never been exposed to tobacco smoke. He has never used  smokeless tobacco. He reports current alcohol use of about 10.0 standard drinks of alcohol per week. He reports that he does not use drugs.  Allergies: Allergies  Allergen Reactions   Codeine Other (See Comments)    Gets "loopy"    Family History:  Family History  Problem Relation Age of Onset   Heart disease Mother    Hypertension Mother    Arthritis Father    Hypertension Father    Prostate cancer Father    Miscarriages / Stillbirths Maternal Grandmother    Heart disease Maternal Grandfather    Sudden death Maternal Grandfather    Breast cancer Sister    Migraines Sister      Current Outpatient Medications:    hydrochlorothiazide (HYDRODIURIL) 25 MG tablet, Take 1 tablet (25 mg total) by mouth daily., Disp: 90 tablet, Rfl: 1   lisinopril (ZESTRIL) 40 MG tablet, Take 1 tablet (40 mg total) by mouth daily., Disp: 90 tablet, Rfl: 0   MOUNJARO 5 MG/0.5ML Pen, INJECT 5 MG SUBCUTANEOUSLY ONCE A WEEK, Disp: 8 mL, Rfl: 0   sertraline (ZOLOFT) 25 MG tablet, TAKE 1 TABLET BY MOUTH AT BEDTIME, Disp: 90 tablet, Rfl: 0   tadalafil (CIALIS) 20 MG tablet, Take 1 tablet (20 mg total) by mouth daily as needed for erectile dysfunction., Disp: 5 tablet, Rfl:  11   atorvastatin (LIPITOR) 40 MG tablet, Take 1 tablet (40 mg total) by mouth daily. (Patient not taking: Reported on 10/11/2023), Disp: 90 tablet, Rfl: 1   citalopram (CELEXA) 40 MG tablet, Take 1 tablet (40 mg total) by mouth daily. (Patient not taking: Reported on 10/11/2023), Disp: 90 tablet, Rfl: 1  Review of Systems:  Negative unless indicated in HPI.   Physical Exam: Vitals:   10/11/23 1328  BP: 110/84  Pulse: 96  Temp: 98.3 F (36.8 C)  TempSrc: Oral  SpO2: 98%  Weight: 205 lb 4.8 oz (93.1 kg)    Body mass index is 34.97 kg/m.   Physical Exam Vitals reviewed.  Constitutional:      Appearance: Normal appearance.  HENT:     Head: Normocephalic and atraumatic.  Eyes:     Conjunctiva/sclera: Conjunctivae normal.      Pupils: Pupils are equal, round, and reactive to light.  Cardiovascular:     Rate and Rhythm: Normal rate and regular rhythm.  Pulmonary:     Effort: Pulmonary effort is normal.     Breath sounds: Normal breath sounds.  Skin:    General: Skin is warm and dry.  Neurological:     General: No focal deficit present.     Mental Status: He is alert and oriented to person, place, and time.  Psychiatric:        Mood and Affect: Mood normal.        Behavior: Behavior normal.        Thought Content: Thought content normal.        Judgment: Judgment normal.      Impression and Plan:  IGT (impaired glucose tolerance) -     POCT glycosylated hemoglobin (Hb A1C)  Primary hypertension -     CBC with Differential/Platelet; Future -     Comprehensive metabolic panel; Future  Hyperlipidemia, unspecified hyperlipidemia type -     Lipid panel; Future  Major depressive disorder, recurrent, in remission (HCC)  Encounter for hepatitis C screening test for low risk patient -     Hepatitis C antibody; Future  -Urgent care charts reviewed. -A1c is improved at 5.7 from 6.4 previously.  He has dropped close to 30 pounds. -Blood pressure is well-controlled today. -Mood is stable, remains under a lot of stress due to his special needs son. -Update labs today.   Time spent:32 minutes reviewing chart, interviewing and examining patient and formulating plan of care.     Chaya Jan, MD [facility]

## 2023-10-12 LAB — HEPATITIS C ANTIBODY: Hepatitis C Ab: NONREACTIVE

## 2023-10-13 ENCOUNTER — Other Ambulatory Visit: Payer: Self-pay | Admitting: Internal Medicine

## 2023-10-13 ENCOUNTER — Encounter: Payer: Self-pay | Admitting: Internal Medicine

## 2023-10-13 DIAGNOSIS — E782 Mixed hyperlipidemia: Secondary | ICD-10-CM

## 2023-10-13 MED ORDER — ATORVASTATIN CALCIUM 40 MG PO TABS: 40.0000 mg | ORAL_TABLET | Freq: Every day | ORAL | 1 refills | Status: AC

## 2023-10-25 ENCOUNTER — Telehealth: Payer: Self-pay | Admitting: Internal Medicine

## 2023-10-25 ENCOUNTER — Telehealth: Payer: Self-pay

## 2023-10-25 NOTE — Telephone Encounter (Signed)
 Appointment scheduled.

## 2023-10-25 NOTE — Telephone Encounter (Signed)
 Copied from CRM [id number]. Topic: Clinical - Medication Question >> Oct 25, 2023 11:38 AM Lenon Radar A wrote: Reason for CRM: Patient called in and wanted to know If he is able to get Toradol  or possible injection of Toradol  for frequent migraines. Patient was seen at Urgent Care 2 weeks ago and was given Toradol  injection for pain. Please contact patient to advise if this is something that can be done. Patient can be reached at [phone].

## 2023-10-25 NOTE — Telephone Encounter (Signed)
 Copied from CRM [id number]. Topic: General - Other >> Oct 25, 2023  2:58 PM Jayson Michael wrote: Reason for CRM: patient calling in regards to wanting Toradol  or possible injection of Toradol  for frequent migraines. Relayed the message to schedule an office visit,  He stated he was seen in office 10/11/23 and discussed this with provider, it is hard for him to schedule an office visit due to not having  child care for his son, and he can not do a virtual visit due at this time to not being able to access his MyChart even after following the steps to reset password, it states invalid information every time, I attempted to help the patient reset password several times and it still stated invalid information- patient would like a call back to further discuss options [phone]

## 2023-10-25 NOTE — Telephone Encounter (Signed)
 Spoke to patient and an appointment scheduled.

## 2023-10-27 ENCOUNTER — Encounter: Payer: Self-pay | Admitting: Internal Medicine

## 2023-10-27 ENCOUNTER — Ambulatory Visit (INDEPENDENT_AMBULATORY_CARE_PROVIDER_SITE_OTHER): Payer: Medicare HMO | Admitting: Internal Medicine

## 2023-10-27 VITALS — BP 120/88 | HR 88 | Temp 98.1°F | Wt 205.9 lb

## 2023-10-27 DIAGNOSIS — R519 Headache, unspecified: Secondary | ICD-10-CM

## 2023-10-27 MED ORDER — KETOROLAC TROMETHAMINE 60 MG/2ML IM SOLN
60.0000 mg | Freq: Once | INTRAMUSCULAR | Status: AC
  Administered 2023-10-27: 60 mg via INTRAMUSCULAR

## 2023-10-27 NOTE — Progress Notes (Signed)
Established Patient Office Visit     CC/Reason for Visit: "I need Toradol for my headache"  HPI: Gary Clark is a 53 y.o. male who is coming in today for the above mentioned reasons.  He again has a posterior headache.  He visited the emergency department for this about a month ago and was given a Toradol injection which helped significantly.  He is under a lot of stress so we believe this is a tension headache.  He is requesting another Toradol injection.   Past Medical/Surgical History: Past Medical History:  Diagnosis Date   Allergy    Anxiety    Arthritis    Asthma    years ago   Back pain    Blood in stool    Cluster headache    Depression    Headache(784.0)    frequent    History of kidney stones    passed 2 times   Hyperlipidemia    Hypertension    Lumbosacral spondylosis without myelopathy 06/15/2013   Migraines     10/27/2018- history of. Not current   Postlaminectomy syndrome, lumbar region 03/08/2015   Status post L4-L5 decompression and fusion, anterior/posterior in 2011    Pre-diabetes    pre diabetic per his PCP   Sleep apnea    tested in 2014 positive    Stroke [facility]) 2008   pt states "mini-Stroke"-    UTI (urinary tract infection)     Past Surgical History:  Procedure Laterality Date   ANTERIOR AND POSTERIOR SPINAL FUSION  2011   ANTERIOR LAT LUMBAR FUSION N/A 10/01/2016   Procedure: XLIF L3-4 ANTERIOR LATERAL LUMBAR FUSION 1 LEVEL;  Surgeon: Venita Lick, MD;  Location: [facility] OR;  Service: Orthopedics;  Laterality: N/A;   BACK SURGERY     HARDWARE REMOVAL N/A 10/01/2016   Procedure: Removal of pedical screws L4-5 HARDWARE REMOVAL;  Surgeon: Venita Lick, MD;  Location: [facility] OR;  Service: Orthopedics;  Laterality: N/A;   LUMBAR LAMINECTOMY/DECOMPRESSION MICRODISCECTOMY  2010    Social History:  reports that he has never smoked. He has never been exposed to tobacco smoke. He has never used smokeless tobacco. He reports current alcohol use of about  10.0 standard drinks of alcohol per week. He reports that he does not use drugs.  Allergies: Allergies  Allergen Reactions   Codeine Other (See Comments)    Gets "loopy"    Family History:  Family History  Problem Relation Age of Onset   Heart disease Mother    Hypertension Mother    Arthritis Father    Hypertension Father    Prostate cancer Father    Miscarriages / Stillbirths Maternal Grandmother    Heart disease Maternal Grandfather    Sudden death Maternal Grandfather    Breast cancer Sister    Migraines Sister      Current Outpatient Medications:    atorvastatin (LIPITOR) 40 MG tablet, Take 1 tablet (40 mg total) by mouth daily., Disp: 90 tablet, Rfl: 1   citalopram (CELEXA) 40 MG tablet, Take 1 tablet (40 mg total) by mouth daily. (Patient not taking: Reported on 10/11/2023), Disp: 90 tablet, Rfl: 1   hydrochlorothiazide (HYDRODIURIL) 25 MG tablet, Take 1 tablet (25 mg total) by mouth daily., Disp: 90 tablet, Rfl: 1   lisinopril (ZESTRIL) 40 MG tablet, Take 1 tablet (40 mg total) by mouth daily., Disp: 90 tablet, Rfl: 0   MOUNJARO 5 MG/0.5ML Pen, INJECT 5 MG SUBCUTANEOUSLY ONCE A WEEK, Disp: 8 mL,  Rfl: 0   sertraline (ZOLOFT) 25 MG tablet, TAKE 1 TABLET BY MOUTH AT BEDTIME, Disp: 90 tablet, Rfl: 0   tadalafil (CIALIS) 20 MG tablet, Take 1 tablet (20 mg total) by mouth daily as needed for erectile dysfunction., Disp: 5 tablet, Rfl: 11  Current Facility-Administered Medications:    ketorolac (TORADOL) injection 60 mg, 60 mg, Intramuscular, Once,   Review of Systems:  Negative unless indicated in HPI.   Physical Exam: Vitals:   10/27/23 1451  BP: 120/88  Pulse: 88  Temp: 98.1 F (36.7 C)  TempSrc: Oral  SpO2: 98%  Weight: 205 lb 14.4 oz (93.4 kg)    Body mass index is 35.07 kg/m.   Physical Exam Vitals reviewed.  Constitutional:      Appearance: Normal appearance.  HENT:     Head: Normocephalic and atraumatic.  Eyes:     Conjunctiva/sclera:  Conjunctivae normal.  Skin:    General: Skin is warm and dry.  Neurological:     General: No focal deficit present.     Mental Status: He is alert and oriented to person, place, and time.  Psychiatric:        Mood and Affect: Mood normal.        Behavior: Behavior normal.        Thought Content: Thought content normal.        Judgment: Judgment normal.      Impression and Plan:  Acute nonintractable headache, unspecified headache type -     Ketorolac Tromethamine   -Okay for repeat Toradol injection today.  If headache continues will need referral to neurology or headache clinic.  Time spent:21 minutes reviewing chart, interviewing and examining patient and formulating plan of care.     Chaya Jan, MD [facility]

## 2023-11-01 ENCOUNTER — Telehealth: Payer: Self-pay

## 2023-11-01 DIAGNOSIS — R519 Headache, unspecified: Secondary | ICD-10-CM

## 2023-11-01 NOTE — Telephone Encounter (Signed)
 Copied from CRM [id number]. Topic: Clinical - Medical Advice >> Nov 01, 2023 12:11 PM Clayton Bibles wrote: Reason for CRM: Hazen is calling back to let doctor know that the Toradol injection did not work. He wants to know what to try next. Please call him at [phone]

## 2023-11-02 NOTE — Telephone Encounter (Signed)
 Patient is aware and agrees. Referral was placed.

## 2023-11-02 NOTE — Addendum Note (Signed)
 Addended by: Kern Reap B on: 11/02/2023 08:12 AM   Modules accepted: Orders

## 2023-11-11 ENCOUNTER — Telehealth: Payer: Self-pay | Admitting: *Deleted

## 2023-11-11 NOTE — Telephone Encounter (Signed)
 Copied from CRM [id number]. Topic: Clinical - Medical Advice >> Nov 11, 2023 11:24 AM Mackie Pai E wrote: Reason for CRM: Patient has been experiencing some migraines/headaches and was able to get a neurology appointment set up for June 13th, but patient was wondering if there was any other medication he can take in the meantime until he gets seen in June for that initial visit.

## 2023-11-12 ENCOUNTER — Telehealth: Payer: Self-pay

## 2023-11-12 NOTE — Telephone Encounter (Signed)
 Copied from CRM [id number]. Topic: Clinical - Medical Advice >> Nov 12, 2023  2:38 PM Gary Clark wrote: Reason for CRM: Patient is requesting an update from Dr. Ardyth Harps regarding the CRM sent yesterday, on if she can prescribe him a medication to help subside his headache unitl he is able to see a neurologist in June.

## 2023-11-16 ENCOUNTER — Other Ambulatory Visit: Payer: Self-pay | Admitting: Internal Medicine

## 2023-11-16 DIAGNOSIS — G8929 Other chronic pain: Secondary | ICD-10-CM

## 2023-11-16 MED ORDER — SUMATRIPTAN SUCCINATE 100 MG PO TABS: 100.0000 mg | ORAL_TABLET | ORAL | 0 refills | Status: DC | PRN

## 2023-11-16 NOTE — Telephone Encounter (Signed)
 Patient is aware

## 2023-11-26 ENCOUNTER — Other Ambulatory Visit: Payer: Self-pay | Admitting: Internal Medicine

## 2023-11-26 DIAGNOSIS — F3340 Major depressive disorder, recurrent, in remission, unspecified: Secondary | ICD-10-CM

## 2023-11-29 ENCOUNTER — Other Ambulatory Visit: Payer: Self-pay | Admitting: Internal Medicine

## 2023-11-29 DIAGNOSIS — R7302 Impaired glucose tolerance (oral): Secondary | ICD-10-CM

## 2023-12-06 ENCOUNTER — Telehealth (INDEPENDENT_AMBULATORY_CARE_PROVIDER_SITE_OTHER): Admitting: Family Medicine

## 2023-12-06 ENCOUNTER — Encounter: Payer: Self-pay | Admitting: Family Medicine

## 2023-12-06 DIAGNOSIS — J069 Acute upper respiratory infection, unspecified: Secondary | ICD-10-CM

## 2023-12-06 NOTE — Progress Notes (Signed)
 Patient ID: YANIS LARIN, male   DOB: Jun 27, 1971, 53 y.o.   MRN: [id number]   Virtual Visit via Video Note  I connected with Coralie Keens  on 12/06/23 at  4:30 PM EDT by a video enabled telemedicine application and verified that I am speaking with the correct person using two identifiers.  Location patient: home Location provider:work or home office Persons participating in the virtual visit: patient, provider  I discussed the limitations of evaluation and management by telemedicine and the availability of in person appointments. The patient expressed understanding and agreed to proceed.   HPI: Mr. Venard had onset Saturday of sore throat and headache.  No fever.  Since then has developed some cough and some nasal congestion.  He has a son who is 59 with autism that he takes care of of.  His son had somewhat similar symptoms.  His son went to physician earlier today and had strep testing that came back negative.  Mr. Alcoser denies any dyspnea.  No nausea, vomiting, or diarrhea.  Non-smoker.   ROS: See pertinent positives and negatives per HPI.  Past Medical History:  Diagnosis Date   Allergy    Anxiety    Arthritis    Asthma    years ago   Back pain    Blood in stool    Cluster headache    Depression    Headache(784.0)    frequent    History of kidney stones    passed 2 times   Hyperlipidemia    Hypertension    Lumbosacral spondylosis without myelopathy 06/15/2013   Migraines     10/27/2018- history of. Not current   Postlaminectomy syndrome, lumbar region 03/08/2015   Status post L4-L5 decompression and fusion, anterior/posterior in 2011    Pre-diabetes    pre diabetic per his PCP   Sleep apnea    tested in 2014 positive    Stroke [facility] [address]) 2008   pt states "mini-Stroke"-    UTI (urinary tract infection)     Past Surgical History:  Procedure Laterality Date   ANTERIOR AND POSTERIOR SPINAL FUSION  2011   ANTERIOR LAT LUMBAR FUSION N/A 10/01/2016   Procedure: XLIF L3-4  ANTERIOR LATERAL LUMBAR FUSION 1 LEVEL;  Surgeon: Venita Lick, MD;  Location: [facility] OR;  Service: Orthopedics;  Laterality: N/A;   BACK SURGERY     HARDWARE REMOVAL N/A 10/01/2016   Procedure: Removal of pedical screws L4-5 HARDWARE REMOVAL;  Surgeon: Venita Lick, MD;  Location: [facility] OR;  Service: Orthopedics;  Laterality: N/A;   LUMBAR LAMINECTOMY/DECOMPRESSION MICRODISCECTOMY  2010    Family History  Problem Relation Age of Onset   Heart disease Mother    Hypertension Mother    Arthritis Father    Hypertension Father    Prostate cancer Father    Miscarriages / Stillbirths Maternal Grandmother    Heart disease Maternal Grandfather    Sudden death Maternal Grandfather    Breast cancer Sister    Migraines Sister     SOCIAL HX: Non-smoker   Current Outpatient Medications:    atorvastatin (LIPITOR) 40 MG tablet, Take 1 tablet (40 mg total) by mouth daily., Disp: 90 tablet, Rfl: 1   citalopram (CELEXA) 40 MG tablet, Take 1 tablet (40 mg total) by mouth daily. (Patient not taking: Reported on 10/11/2023), Disp: 90 tablet, Rfl: 1   hydrochlorothiazide (HYDRODIURIL) 25 MG tablet, Take 1 tablet (25 mg total) by mouth daily., Disp: 90 tablet, Rfl: 1   lisinopril (ZESTRIL) 40 MG tablet,  Take 1 tablet (40 mg total) by mouth daily., Disp: 90 tablet, Rfl: 0   MOUNJARO 5 MG/0.5ML Pen, INJECT 5 MG INTO THE SKIN ONCE A WEEK, Disp: 16 mL, Rfl: 0   sertraline (ZOLOFT) 25 MG tablet, Take 1 tablet (25 mg total) by mouth daily., Disp: 90 tablet, Rfl: 0   SUMAtriptan (IMITREX) 100 MG tablet, Take 1 tablet (100 mg total) by mouth every 2 (two) hours as needed for migraine. May repeat in 2 hours if headache persists or recurs., Disp: 10 tablet, Rfl: 0   tadalafil (CIALIS) 20 MG tablet, Take 1 tablet (20 mg total) by mouth daily as needed for erectile dysfunction., Disp: 5 tablet, Rfl: 11  EXAM:  VITALS per patient if applicable:  GENERAL: alert, oriented, appears well and in no acute distress  HEENT:  atraumatic, conjunttiva clear, no obvious abnormalities on inspection of external nose and ears  NECK: normal movements of the head and neck  LUNGS: on inspection no signs of respiratory distress, breathing rate appears normal, no obvious gross SOB, gasping or wheezing  CV: no obvious cyanosis  MS: moves all visible extremities without noticeable abnormality  PSYCH/NEURO: pleasant and cooperative, no obvious depression or anxiety, speech and thought processing grossly intact  ASSESSMENT AND PLAN:  Discussed the following assessment and plan:  Probable viral URI with cough.  Patient will try over-the-counter medications for symptomatic relief.  Cough is not severe at this time.  Follow-up promptly for any fever or worsening symptoms otherwise     I discussed the assessment and treatment plan with the patient. The patient was provided an opportunity to ask questions and all were answered. The patient agreed with the plan and demonstrated an understanding of the instructions.   The patient was advised to call back or seek an in-person evaluation if the symptoms worsen or if the condition fails to improve as anticipated.     Evelena Peat, MD

## 2023-12-10 ENCOUNTER — Telehealth: Payer: Self-pay

## 2023-12-10 NOTE — Telephone Encounter (Signed)
 Copied from CRM [id number]. Topic: Clinical - Prescription Issue >> Dec 10, 2023  1:36 PM Eunice Blase wrote: Reason for CRM: Pt called can't get into Neurologist until 02/24/2024 and medication Dr. Ardyth Harps provide to pt is not helping. Pt states he is still have sever headaches. He would like to know if there is something strong that the doctor can prescribe for him. Please call pt at  [phone].

## 2023-12-20 ENCOUNTER — Other Ambulatory Visit: Payer: Self-pay | Admitting: Internal Medicine

## 2023-12-20 DIAGNOSIS — G8929 Other chronic pain: Secondary | ICD-10-CM

## 2023-12-20 MED ORDER — SUMATRIPTAN SUCCINATE 100 MG PO TABS
100.0000 mg | ORAL_TABLET | ORAL | 0 refills | Status: DC | PRN
Start: 2023-12-20 — End: 2024-03-27

## 2023-12-20 NOTE — Telephone Encounter (Signed)
 Patient is aware

## 2023-12-23 ENCOUNTER — Other Ambulatory Visit: Payer: Self-pay | Admitting: Internal Medicine

## 2023-12-23 DIAGNOSIS — I10 Essential (primary) hypertension: Secondary | ICD-10-CM

## 2024-01-20 ENCOUNTER — Encounter: Payer: Medicare HMO | Admitting: Family Medicine

## 2024-01-20 NOTE — Progress Notes (Signed)
 error

## 2024-01-20 NOTE — Patient Instructions (Signed)
 I really enjoyed getting to talk with you today! I am available on Tuesdays and Thursdays for virtual visits if you have any questions or concerns, or if I can be of any further assistance.   CHECKLIST FROM ANNUAL WELLNESS VISIT:  -Follow up (please call to schedule if not scheduled after visit):   -yearly for annual wellness visit with primary care office  Here is a list of your preventive care/health maintenance measures and the plan for each if any are due:  PLAN For any measures below that may be due:   Health Maintenance  Topic Date Due   Pneumococcal Vaccine 80-49 Years old (1 of 2 - PCV) Never done   Colonoscopy  Never done   Zoster Vaccines- Shingrix (1 of 2) Never done   COVID-19 Vaccine (3 - 2024-25 season) 05/16/2023   HIV Screening  12/31/2024 (Originally 02/07/1986)   INFLUENZA VACCINE  04/14/2024   DTaP/Tdap/Td (2 - Td or Tdap) 12/31/2024   Medicare Annual Wellness (AWV)  01/19/2025   Hepatitis C Screening  Completed   HPV VACCINES  Aged Out   Meningococcal B Vaccine  Aged Out    -See a dentist at least yearly  -Get your eyes checked and then per your eye specialist's recommendations  -Other issues addressed today:   -I have included below further information regarding a healthy whole foods based diet, physical activity guidelines for adults, stress management and opportunities for social connections. I hope you find this information useful.   -----------------------------------------------------------------------------------------------------------------------------------------------------------------------------------------------------------------------------------------------------------    NUTRITION: -eat real food: lots of colorful vegetables (half the plate) and fruits -5-7 servings of vegetables and fruits per day (fresh or steamed is best), exp. 2 servings of vegetables with lunch and dinner and 2 servings of fruit per day. Berries and greens such as  kale and collards are great choices.  -consume on a regular basis:  fresh fruits, fresh veggies, fish, nuts, seeds, healthy oils (such as olive oil, avocado oil), whole grains (make sure for bread/pasta/crackers/etc., that the first ingredient on label contains the word "whole"), legumes. -can eat small amounts of dairy and lean meat (no larger than the palm of your hand), but avoid processed meats such as ham, bacon, lunch meat, etc. -drink water -try to avoid fast food and pre-packaged foods, processed meat, ultra processed foods/beverages (donuts, candy, etc.) -most experts advise limiting sodium to < 2300mg  per day, should limit further is any chronic conditions such as high blood pressure, heart disease, diabetes, etc. The American Heart Association advised that < 1500mg  is is ideal -try to avoid foods/beverages that contain any ingredients with names you do not recognize  -try to avoid foods/beverages  with added sugar or sweeteners/sweets  -try to avoid sweet drinks (including diet drinks): soda, juice, Gatorade, sweet tea, power drinks, diet drinks -try to avoid white rice, white bread, pasta (unless whole grain)  EXERCISE GUIDELINES FOR ADULTS: -if you wish to increase your physical activity, do so gradually and with the approval of your doctor -STOP and seek medical care immediately if you have any chest pain, chest discomfort or trouble breathing when starting or increasing exercise  -move and stretch your body, legs, feet and arms when sitting for long periods -Physical activity guidelines for optimal health in adults: -get at least 150 minutes per week of moderate exercise (can talk, but not sing); this is about 20-30 minutes of sustained activity 5-7 days per week or two 10-15 minute episodes of sustained activity 5-7 days per week -do some  muscle building/resistance training/strength training at least 2 days per week  -balance exercises 3+ days per week:   Stand somewhere where you  have something sturdy to hold onto if you lose balance    1) lift up on toes, then back down, start with 5x per day and work up to 20x   2) stand and lift one leg straight out to the side so that foot is a few inches of the floor, start with 5x each side and work up to 20x each side   3) stand on one foot, start with 5 seconds each side and work up to 20 seconds on each side  If you need ideas or help with getting more active:  -Silver sneakers https://tools.silversneakers.com  -Walk with a Doc: http://www.duncan-williams.com/  -try to include resistance (weight lifting/strength building) and balance exercises twice per week: or the following link for ideas: http://castillo-powell.com/  BuyDucts.dk  STRESS MANAGEMENT: -can try meditating, or just sitting quietly with deep breathing while intentionally relaxing all parts of your body for 5 minutes daily -if you need further help with stress, anxiety or depression please follow up with your primary doctor or contact the wonderful folks at [facility]: [phone]  SOCIAL CONNECTIONS: -options in [place] if you wish to engage in more social and exercise related activities:  -Silver sneakers https://tools.silversneakers.com  -Walk with a Doc: http://www.duncan-williams.com/  -Check out the [facility] Active Adults 50+ section on the [place] of [employer] (hiking clubs, book clubs, cards and games, chess, exercise classes, aquatic classes and much more) - see the website for details: https://www.[place].gov/departments/parks-recreation/active-adults50  -YouTube has lots of exercise videos for different ages and abilities as well  -[facility] (a variety of indoor and outdoor inperson activities for adults). [phone]. [address].  -Virtual Online Classes (a variety of topics): see seniorplanet.org or  call [phone]  -consider volunteering at a school, hospice center, church, senior center or elsewhere

## 2024-02-18 ENCOUNTER — Other Ambulatory Visit: Payer: Self-pay | Admitting: Internal Medicine

## 2024-02-18 DIAGNOSIS — R7302 Impaired glucose tolerance (oral): Secondary | ICD-10-CM

## 2024-02-25 ENCOUNTER — Ambulatory Visit: Payer: Self-pay | Admitting: Diagnostic Neuroimaging

## 2024-03-01 ENCOUNTER — Other Ambulatory Visit: Payer: Self-pay | Admitting: Internal Medicine

## 2024-03-01 DIAGNOSIS — F3340 Major depressive disorder, recurrent, in remission, unspecified: Secondary | ICD-10-CM

## 2024-03-27 ENCOUNTER — Ambulatory Visit: Payer: Self-pay

## 2024-03-27 ENCOUNTER — Other Ambulatory Visit: Payer: Self-pay | Admitting: Internal Medicine

## 2024-03-27 DIAGNOSIS — G8929 Other chronic pain: Secondary | ICD-10-CM

## 2024-03-27 MED ORDER — SUMATRIPTAN SUCCINATE 100 MG PO TABS: 100.0000 mg | ORAL_TABLET | ORAL | 0 refills | Status: DC | PRN

## 2024-03-27 NOTE — Telephone Encounter (Signed)
 Refill has been sent.

## 2024-03-27 NOTE — Telephone Encounter (Signed)
 Copied from CRM [id number]. Topic: Clinical - Medication Question >> Mar 27, 2024  2:43 PM Robinson H wrote: Reason for CRM: Patient would like to know if provider will give him more of the SUMAtriptan  (IMITREX ) 100 MG tablet or something else to hold  him over until he sees the Neurologist, states he has to take 2 of the SUMAtriptan  (IMITREX ) 100 MG tablet  for them to work.  Isaul [phone]

## 2024-04-11 ENCOUNTER — Telehealth: Payer: Self-pay | Admitting: Internal Medicine

## 2024-04-11 NOTE — Telephone Encounter (Signed)
 Copied from CRM [id number]. Topic: Referral - Request for Referral >> Apr 11, 2024 10:23 AM Drema MATSU wrote: Did the patient discuss referral with their provider in the last year? No (If No - schedule appointment) (If Yes - send message)  Appointment offered? Yes  Type of order/referral and detailed reason for visit: Neurology for Migraines  Preference of office, provider, location: N/A anywhere [facility] if possible [facility] neurology can't see patient until Late October and he wants to go somewhere else that's sooner  If referral order, have you been seen by this specialty before? Yes (If Yes, this issue or another issue? When? Where? 2015 and unsure of location   Can we respond through MyChart? No

## 2024-04-20 ENCOUNTER — Telehealth: Payer: Self-pay | Admitting: *Deleted

## 2024-04-20 NOTE — Telephone Encounter (Signed)
 Copied from CRM [id number]. Topic: Referral - Request for Referral >> Apr 11, 2024 10:23 AM Drema MATSU wrote: Did the patient discuss referral with their provider in the last year? No (If No - schedule appointment) (If Yes - send message)  Appointment offered? Yes  Type of order/referral and detailed reason for visit: Neurology for Migraines  Preference of office, provider, location: N/A anywhere [facility] if possible [facility] neurology can't see patient until Late October and he wants to go somewhere else that's sooner  If referral order, have you been seen by this specialty before? Yes (If Yes, this issue or another issue? When? Where? 2015 and unsure of location   Can we respond through MyChart? No >> Apr 20, 2024  2:03 PM Chiquita SQUIBB wrote: Patient is calling in regarding the new neurology referral, patient would like an update on this new referral.  >> Apr 13, 2024 12:08 PM Chiquita SQUIBB wrote: Patient is calling in for an update, patient would like a call when this has been put in as his rhona is not working.

## 2024-05-13 ENCOUNTER — Other Ambulatory Visit: Payer: Self-pay | Admitting: Internal Medicine

## 2024-05-13 DIAGNOSIS — R7302 Impaired glucose tolerance (oral): Secondary | ICD-10-CM

## 2024-05-16 ENCOUNTER — Other Ambulatory Visit: Payer: Self-pay | Admitting: Internal Medicine

## 2024-05-16 ENCOUNTER — Telehealth: Payer: Self-pay | Admitting: *Deleted

## 2024-05-16 NOTE — Telephone Encounter (Signed)
 Copied from CRM #[id number]. Topic: Clinical - Medication Question >> May 16, 2024 11:20 AM [place] J wrote: Reason for CRM: Patient wanted to check on the status of his MOUNJARO  5 MG/0.5ML Pen. I was able to notify him of the 3 day turn around time. The patient acknowledged and was wondering if a phone call could be made once it is approved.

## 2024-06-20 ENCOUNTER — Other Ambulatory Visit: Payer: Self-pay | Admitting: Internal Medicine

## 2024-06-20 DIAGNOSIS — I10 Essential (primary) hypertension: Secondary | ICD-10-CM

## 2024-06-21 ENCOUNTER — Other Ambulatory Visit: Payer: Self-pay | Admitting: Internal Medicine

## 2024-06-21 DIAGNOSIS — I10 Essential (primary) hypertension: Secondary | ICD-10-CM

## 2024-06-23 ENCOUNTER — Ambulatory Visit: Payer: Self-pay

## 2024-06-23 NOTE — Telephone Encounter (Signed)
 FYI Only or Action Required?: FYI only for provider.  Patient was last seen in primary care on 12/06/2023 by Gary Clark ORN, MD.  Called Nurse Triage reporting Shortness of Breath.  Symptoms began several days ago.  Interventions attempted: Rest, hydration, or home remedies.  Symptoms are: unchanged.  Triage Disposition: Go to ED Now (Notify PCP)  Patient/caregiver understands and will follow disposition?: No, refuses disposition Reason for Disposition  Extra heartbeats, irregular heart beating, or heart is beating very fast  (i.e., palpitations)  Answer Assessment - Initial Assessment Questions Stated [place] the airport and felt like he walked up a bunch of flight of stairs. Denies chest pain. Stated just laying down last night heart was racing. Any sort of walking / minimal exertion puts patient out of breath. Advised ED, patient stated he is at the airport in [place] and needs to get home due to his son. Advised patient to go to UC or ED as soon as he lands in [place]. Patient stated he will do that.  1. RESPIRATORY STATUS: Describe your breathing? (e.g., wheezing, shortness of breath, unable to speak, severe coughing)       Feels like walked a flight of stairs   2. ONSET: When did this breathing problem begin?      Last few days, noticed it in [facility] walking around, was sweating, had to sit down, hard to breath, felt like was going to pass out.  3. PATTERN Does the difficult breathing come and go, or has it been constant since it started?      Happens with minimal exertion  4. SEVERITY: How bad is your breathing? (e.g., mild, moderate, severe)      Moderate  5. RECURRENT SYMPTOM: Have you had difficulty breathing before? If Yes, ask: When was the last time? and What happened that time?      Not quite this bad, happened years ago and was doing a lot of walking at the beach and weighed a lot more.  6. CARDIAC HISTORY: Do you have any history of heart  disease? (e.g., heart attack, angina, bypass surgery, angioplasty)      Denies, mother had heart disease  7. LUNG HISTORY: Do you have any history of lung disease?  (e.g., pulmonary embolus, asthma, emphysema)     Asthma as a kid  8. CAUSE: What do you think is causing the breathing problem?      Unsure  9. OTHER SYMPTOMS: Do you have any other symptoms? (e.g., chest pain, cough, dizziness, fever, runny nose)     Heart racing, dizziness  10. O2 SATURATION MONITOR:  Do you use an oxygen saturation monitor (pulse oximeter) at home? If Yes, ask: What is your reading (oxygen level) today? What is your usual oxygen saturation reading? (e.g., 95%)       Unable to test  Protocols used: Breathing Difficulty-A-AH  Copied from CRM #[id number]. Topic: Clinical - Red Word Triage >> Jun 23, 2024  1:37 PM [name] wrote: Red Word that prompted transfer to Nurse Triage:  Experiencing SOB with little excertion ,felt like he was going to pass out. Started 2 days ago

## 2024-07-13 ENCOUNTER — Other Ambulatory Visit: Payer: Self-pay | Admitting: Internal Medicine

## 2024-07-13 ENCOUNTER — Ambulatory Visit: Admitting: Internal Medicine

## 2024-07-13 DIAGNOSIS — Z8249 Family history of ischemic heart disease and other diseases of the circulatory system: Secondary | ICD-10-CM

## 2024-07-13 DIAGNOSIS — I499 Cardiac arrhythmia, unspecified: Secondary | ICD-10-CM

## 2024-07-16 ENCOUNTER — Other Ambulatory Visit: Payer: Self-pay | Admitting: Internal Medicine

## 2024-07-16 DIAGNOSIS — R519 Headache, unspecified: Secondary | ICD-10-CM

## 2024-07-25 ENCOUNTER — Ambulatory Visit

## 2024-07-25 NOTE — Progress Notes (Deleted)
 I connected with  Gary Clark on 07/25/24 by a {Video Enabled:26378::video and audio} enabled telemedicine application and verified that I am speaking with the correct person using two identifiers.  {Patient Location:[phone]::Home}  {Provider [phone]::Home Office}  Persons Participating in Visit: {Persons Participating in Visit:32444}  I discussed the limitations of evaluation and management by telemedicine. The patient expressed understanding and agreed to proceed.  Vital Signs: Because this visit was a virtual/telehealth visit, some criteria may be missing or patient reported. Any vitals not documented were not able to be obtained and vitals that have been documented are patient reported.

## 2024-07-28 ENCOUNTER — Telehealth: Payer: Self-pay

## 2024-07-28 NOTE — Progress Notes (Signed)
   07/28/2024  Patient ID: Gary Clark, male   DOB: 1971-06-29, 53 y.o.   MRN: [id number]  Pharmacy Quality Measure Review  This patient is appearing on a report for being at risk of failing the adherence measure for hypertension (ACEi/ARB) medications this calendar year.   Medication: Lisinopril  Last fill date: 07/28/24 for 90 day supply  Insurance report was not up to date. No action needed at this time.  Patient aware pharmacy is getting ready for pick up.  Jon VEAR Lindau, PharmD Clinical Pharmacist [phone]

## 2024-08-04 ENCOUNTER — Encounter: Payer: Self-pay | Admitting: Physician Assistant

## 2024-08-04 ENCOUNTER — Ambulatory Visit: Attending: Physician Assistant | Admitting: Physician Assistant

## 2024-08-04 VITALS — BP 120/90 | HR 88 | Ht 65.0 in | Wt 190.4 lb

## 2024-08-04 DIAGNOSIS — R002 Palpitations: Secondary | ICD-10-CM

## 2024-08-04 DIAGNOSIS — Z8249 Family history of ischemic heart disease and other diseases of the circulatory system: Secondary | ICD-10-CM

## 2024-08-04 NOTE — Progress Notes (Unsigned)
 Cardiology Office Note   Date:  08/06/2024  ID:  Gary Clark, DOB 07-Jun-1971, MRN [id number] PCP: Theophilus Andrews, Tully GRADE, MD  [facility] Providers Cardiologist:  [facility] - DOD Dr. Lavona  History of Present Illness Gary Clark is a 53 y.o. male with past medical history of depression, OSA, hypertension, hyperlipidemia, lumbosacral spondylosis and prediabetes.  Patient was last seen by PCP in February 2025 for intractable headache.  Previous echocardiogram in April 2009 was normal.  Exercise tolerance test in September 2017 was normal without EKG changes. CT of the lumbar spine in January 2020 showed severe facet arthrosis at L5-S1 with grade 1 anterolisthesis with increased standing, disc protrusion at L5-S1, solid L3-4 and L4-5 fusion.  Patient presents today for [facility] evaluation of palpitation.  [place] in [place] in early [place] when he had sudden onset of diaphoresis, feeling of presyncope and chest pounding sensation.  He had to sit down in order to prevent himself from passing out.  Symptoms gradually went away after hour.  He did not seek medical attention.  Since then, he had a several episode of palpitation.  In early November, he had another episode while try to lift up his son.  He is caring for his disabled son.  Patient also has family history of early CAD.  His mother recently passed away from major MI.  Maternal grandfather passed away with MI in his 61s.  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  He denies any exertional chest pain or shortness of breath.  I recommended echocardiogram and 30-day heart monitor to assess his palpitation.  For his family history of early CAD, I recommended Lipoprotein a and a coronary calcium  scoring test.  I also recommended basic blood work including CMP, CBC and TSH to rule out other potential causes.  ROS:   Patient had 2 episode of dizziness and palpitation.  He denies any  exertional chest pain or shortness of breath.   Studies Reviewed EKG Interpretation Date/Time:  Friday August 04 2024 13:33:27 EST Ventricular Rate:  88 PR Interval:  154 QRS Duration:  78 QT Interval:  336 QTC Calculation: 406 R Axis:   3  Text Interpretation: Normal sinus rhythm Normal ECG When compared with ECG of 27-Oct-2018 09:29, No significant change was found Confirmed by Janene Boer [phone]) on 08/04/2024 1:41:36 PM    Cardiac Studies & Procedures   ______________________________________________________________________________________________   STRESS TESTS  EXERCISE TOLERANCE TEST (ETT) 05/28/2016  Interpretation Summary  Blood pressure demonstrated a normal response to exercise.  There was no ST segment deviation noted during stress.  No T wave inversion was noted during stress.  Normal ECG stress test.            ______________________________________________________________________________________________      Risk Assessment/Calculations          Physical Exam VS:  BP (!) 120/90   Pulse 88   Ht 5' 5 (1.651 m)   Wt 190 lb 6.4 oz (86.4 kg)   SpO2 97%   BMI 31.68 kg/m        Wt Readings from Last 3 Encounters:  08/04/24 190 lb 6.4 oz (86.4 kg)  10/27/23 205 lb 14.4 oz (93.4 kg)  10/11/23 205 lb 4.8 oz (93.1 kg)    GEN: Well nourished, well developed in no acute distress NECK: No JVD; No carotid bruits CARDIAC: RRR, no murmurs, rubs, gallops RESPIRATORY:  Clear to auscultation without  rales, wheezing or rhonchi  ABDOMEN: Soft, non-tender, non-distended EXTREMITIES:  No edema; No deformity   ASSESSMENT AND PLAN  Palpitation: Obtain echocardiogram and a 30-day heart monitor.  Obtain CMP, CBC, fasting lipid and TSH.  Family history of early CAD: Obtain lipoprotein a and coronary calcium  score test.  Patient is aware that coronary calcium  score is likely not covered by insurance and has a $99 out-of-pocket cost.       Dispo: Follow-up  with Dr. Lavona in 3 months  Signed, Aerith Canal, [place]

## 2024-08-04 NOTE — Patient Instructions (Signed)
 Medication Instructions:  Your physician recommends that you continue on your current medications as directed. Please refer to the Current Medication list given to you today.  *If you need a refill on your cardiac medications before your next appointment, please call your pharmacy*  Lab Work: COME BACK TO THE LAB, FASTING, FOR:  CMET, CBC, TSH, LIPID, & LPa  If you have labs (blood work) drawn today and your tests are completely normal, you will receive your results only by: MyChart Message (if you have MyChart) OR A paper copy in the mail If you have any lab test that is abnormal or we need to change your treatment, we will call you to review the results.  Testing/Procedures: Your physician recommends you have a CT Calcium  Score Study.  This is a self-pay study of $99.  Your physician has requested that you have an echocardiogram. Echocardiography is a painless test that uses sound waves to create images of your heart. It provides your doctor with information about the size and shape of your heart and how well your heart's chambers and valves are working. This procedure takes approximately one hour. There are no restrictions for this procedure. Please do NOT wear cologne, perfume, aftershave, or lotions (deodorant is allowed). Please arrive 15 minutes prior to your appointment time.  Please note: We ask at that you not bring children with you during ultrasound (echo/ vascular) testing. Due to room size and safety concerns, children are not allowed in the ultrasound rooms during exams. Our front office staff cannot provide observation of children in our lobby area while testing is being conducted. An adult accompanying a patient to their appointment will only be allowed in the ultrasound room at the discretion of the ultrasound technician under special circumstances. We apologize for any inconvenience.   [employer] Cardiac Event Monitor Instructions  Your physician has requested you wear  your cardiac event monitor for _____ days, (1-30). [employer] may call or text to confirm a shipping address. The monitor will be sent to a land address via UPS. [employer] will not ship a monitor to a PO BOX. It typically takes 3-5 days to receive your monitor after it has been enrolled. [employer] will assist with USPS tracking if your package is delayed. The telephone number for [employer] is [phone]. Once you have received your monitor, please review the enclosed instructions. Instruction tutorials can also be viewed under help and settings on the enclosed cell phone. Your monitor has already been registered assigning a specific monitor serial # to you.  Billing and Self Pay Discount Information  [employer] has been provided the insurance information we had on file for you.  If your insurance has been updated, please call [employer] at [phone] to provide them with your updated insurance information.   [employer] offers a discounted Self Pay option for patients who have insurance that does not cover their cardiac event monitor or patients without insurance.  The discounted cost of a Self Pay Cardiac Event Monitor would be $225.00 , if the patient contacts [employer] at [phone] within 7 days of applying the monitor to make payment arrangements.  If the patient does not contact [employer] within 7 days of applying the monitor, the cost of the cardiac event monitor will be $350.00.  Applying the monitor  Remove cell phone from case and turn it on. The cell phone works as it consultant and needs to be within [facility] of you at all times. The cell phone will need to be charged on  a daily basis. We recommend you plug the cell phone into the enclosed charger at your bedside table every night.  Monitor batteries: You will receive two monitor batteries labelled #1 and #2. These are your recorders. Plug battery #2 onto the second connection on the enclosed charger. Keep one battery  on the charger at all times. This will keep the monitor battery deactivated. It will also keep it fully charged for when you need to switch your monitor batteries. A small light will be blinking on the battery emblem when it is charging. The light on the battery emblem will remain on when the battery is fully charged.  Open package of a Monitor strip. Insert battery #1 into black hood on strip and gently squeeze monitor battery onto connection as indicated in instruction booklet. Set aside while preparing skin.  Choose location for your strip, vertical or horizontal, as indicated in the instruction booklet. Shave to remove all hair from location. There cannot be any lotions, oils, powders, or colognes on skin where monitor is to be applied. Wipe skin clean with enclosed Saline wipe. Dry skin completely.  Peel paper labeled #1 off the back of the Monitor strip exposing the adhesive. Place the monitor on the chest in the vertical or horizontal position shown in the instruction booklet. One arrow on the monitor strip must be pointing upward. Carefully remove paper labeled #2, attaching remainder of strip to your skin. Try not to create any folds or wrinkles in the strip as you apply it.  Firmly press and release the circle in the center of the monitor battery. You will hear a small beep. This is turning the monitor battery on. The heart emblem on the monitor battery will light up every 5 seconds if the monitor battery in turned on and connected to the patient securely. Do not push and hold the circle down as this turns the monitor battery off. The cell phone will locate the monitor battery. A screen will appear on the cell phone checking the connection of your monitor strip. This may read poor connection initially but change to good connection within the next minute. Once your monitor accepts the connection you will hear a series of 3 beeps followed by a climbing crescendo of beeps. A screen will  appear on the cell phone showing the two monitor strip placement options. Touch the picture that demonstrates where you applied the monitor strip.  Your monitor strip and battery are waterproof. You are able to shower, bathe, or swim with the monitor on. They just ask you do not submerge deeper than 3 feet underwater. We recommend removing the monitor if you are swimming in a lake, river, or ocean.  Your monitor battery will need to be switched to a fully charged monitor battery approximately once a week. The cell phone will alert you of an action which needs to be made.  On the cell phone, tap for details to reveal connection status, monitor battery status, and cell phone battery status. The green dots indicates your monitor is in good status. A red dot indicates there is something that needs your attention.  To record a symptom, click the circle on the monitor battery. In 30-60 seconds a list of symptoms will appear on the cell phone. Select your symptom and tap save. Your monitor will record a sustained or significant arrhythmia regardless of you clicking the button. Some patients do not feel the heart rhythm irregularities. [employer] will notify us  of any serious or critical events.  Refer to instruction booklet for instructions on switching batteries, changing strips, the Do not disturb or Pause features, or any additional questions.  Call [employer] at [phone], to confirm your monitor is transmitting and record your baseline. They will answer any questions you may have regarding the monitor instructions at that time.  Returning the monitor to [employer]  Place all equipment back into blue box. Peel off strip of paper to expose adhesive and close box securely. There is a prepaid UPS shipping label on this box. Drop in a UPS drop box, or at a UPS facility like [employer]. You may also contact [employer] to arrange UPS to pick up monitor package at your home.   Follow-Up: At  [facility], you and your health needs are our priority.  As part of our continuing mission to provide you with exceptional heart care, our providers are all part of one team.  This team includes your primary Cardiologist (physician) and Advanced Practice Providers or APPs (Physician Assistants and Nurse Practitioners) who all work together to provide you with the care you need, when you need it.  Your next appointment:   3 month(s)  Provider:   Lynwood Schilling, MD    We recommend signing up for the patient portal called MyChart.  Sign up information is provided on this After Visit Summary.  MyChart is used to connect with patients for Virtual Visits (Telemedicine).  Patients are able to view lab/test results, encounter notes, upcoming appointments, etc.  Non-urgent messages can be sent to your provider as well.   To learn more about what you can do with MyChart, go to [email or web address].   Other Instructions

## 2024-08-04 NOTE — Progress Notes (Unsigned)
 EKG

## 2024-08-08 ENCOUNTER — Other Ambulatory Visit: Payer: Self-pay | Admitting: Internal Medicine

## 2024-08-08 DIAGNOSIS — R7302 Impaired glucose tolerance (oral): Secondary | ICD-10-CM

## 2024-08-08 MED ORDER — MOUNJARO 5 MG/0.5ML ~~LOC~~ SOAJ: 5.0000 mg | SUBCUTANEOUS | 0 refills | Status: AC

## 2024-08-08 NOTE — Telephone Encounter (Signed)
 Copied from CRM [id number]. Topic: Clinical - Medication Refill >> Aug 08, 2024  9:29 AM Vena HERO wrote: Medication: MOUNJARO  5 MG/0.5ML Pen  Has the patient contacted their pharmacy? Yes (Agent: If no, request that the patient contact the pharmacy for the refill. If patient does not wish to contact the pharmacy document the reason why and proceed with request.) (Agent: If yes, when and what did the pharmacy advise?) Said they sent request and have not heard back  This is the patient's preferred pharmacy:  [id number] - [place] ([place]), [place] - [address] [place] ([place]) [place] [address] Phone: [phone] Fax: [phone]  Is this the correct pharmacy for this prescription? Yes If no, delete pharmacy and type the correct one.   Has the prescription been filled recently? No  Is the patient out of the medication? No  Has the patient been seen for an appointment in the last year OR does the patient have an upcoming appointment? Yes  Can we respond through MyChart? Yes  Agent: Please be advised that Rx refills may take up to 3 business days. We ask that you follow-up with your pharmacy.

## 2024-08-09 ENCOUNTER — Other Ambulatory Visit: Payer: Self-pay | Admitting: Internal Medicine

## 2024-08-09 DIAGNOSIS — I10 Essential (primary) hypertension: Secondary | ICD-10-CM

## 2024-09-13 ENCOUNTER — Ambulatory Visit: Attending: Physician Assistant

## 2024-09-13 ENCOUNTER — Other Ambulatory Visit: Payer: Self-pay | Admitting: *Deleted

## 2024-09-13 DIAGNOSIS — R002 Palpitations: Secondary | ICD-10-CM

## 2024-09-13 DIAGNOSIS — Z8249 Family history of ischemic heart disease and other diseases of the circulatory system: Secondary | ICD-10-CM

## 2024-09-14 DIAGNOSIS — Z8249 Family history of ischemic heart disease and other diseases of the circulatory system: Secondary | ICD-10-CM | POA: Diagnosis not present

## 2024-09-14 DIAGNOSIS — R002 Palpitations: Secondary | ICD-10-CM

## 2024-09-19 ENCOUNTER — Ambulatory Visit: Payer: Self-pay | Admitting: Physician Assistant

## 2024-09-29 ENCOUNTER — Telehealth (HOSPITAL_COMMUNITY): Payer: Self-pay | Admitting: Physician Assistant

## 2024-09-29 NOTE — Telephone Encounter (Signed)
 Patient called and cancelled echocardiogram and CT due to babysitting issues and will call back to reschedule. Order will be removed from the active echo WQ. If patient calls back we will reinstate the order. Thank you.

## 2024-10-02 ENCOUNTER — Ambulatory Visit (HOSPITAL_COMMUNITY): Payer: Self-pay

## 2024-11-10 ENCOUNTER — Ambulatory Visit: Admitting: Cardiology
